# Patient Record
Sex: Male | Born: 1965
Health system: Southern US, Community
[De-identification: ages and names within clinical notes are randomized; demographics above are authoritative.]

## PROBLEM LIST (undated history)

## (undated) DIAGNOSIS — K219 Gastro-esophageal reflux disease without esophagitis: Secondary | ICD-10-CM

## (undated) DIAGNOSIS — N201 Calculus of ureter: Secondary | ICD-10-CM

## (undated) DIAGNOSIS — S060X9A Concussion with loss of consciousness of unspecified duration, initial encounter: Secondary | ICD-10-CM

## (undated) DIAGNOSIS — J4540 Moderate persistent asthma, uncomplicated: Secondary | ICD-10-CM

## (undated) DIAGNOSIS — F32A Depression, unspecified: Secondary | ICD-10-CM

## (undated) DIAGNOSIS — T7840XA Allergy, unspecified, initial encounter: Secondary | ICD-10-CM

## (undated) DIAGNOSIS — Z972 Presence of dental prosthetic device (complete) (partial): Secondary | ICD-10-CM

## (undated) DIAGNOSIS — R7303 Prediabetes: Secondary | ICD-10-CM

## (undated) DIAGNOSIS — D649 Anemia, unspecified: Secondary | ICD-10-CM

## (undated) DIAGNOSIS — M1990 Unspecified osteoarthritis, unspecified site: Secondary | ICD-10-CM

## (undated) DIAGNOSIS — J302 Other seasonal allergic rhinitis: Secondary | ICD-10-CM

## (undated) DIAGNOSIS — I10 Essential (primary) hypertension: Secondary | ICD-10-CM

## (undated) DIAGNOSIS — Z87438 Personal history of other diseases of male genital organs: Secondary | ICD-10-CM

## (undated) DIAGNOSIS — J189 Pneumonia, unspecified organism: Secondary | ICD-10-CM

## (undated) DIAGNOSIS — Z973 Presence of spectacles and contact lenses: Secondary | ICD-10-CM

## (undated) DIAGNOSIS — G4733 Obstructive sleep apnea (adult) (pediatric): Secondary | ICD-10-CM

## (undated) DIAGNOSIS — A4902 Methicillin resistant Staphylococcus aureus infection, unspecified site: Secondary | ICD-10-CM

## (undated) DIAGNOSIS — Z8719 Personal history of other diseases of the digestive system: Secondary | ICD-10-CM

## (undated) DIAGNOSIS — E739 Lactose intolerance, unspecified: Secondary | ICD-10-CM

## (undated) DIAGNOSIS — U071 COVID-19: Secondary | ICD-10-CM

## (undated) DIAGNOSIS — Z860101 Personal history of adenomatous and serrated colon polyps: Secondary | ICD-10-CM

## (undated) DIAGNOSIS — F419 Anxiety disorder, unspecified: Secondary | ICD-10-CM

## (undated) DIAGNOSIS — G4730 Sleep apnea, unspecified: Secondary | ICD-10-CM

## (undated) DIAGNOSIS — Z86010 Personal history of colonic polyps: Secondary | ICD-10-CM

## (undated) HISTORY — PX: UPPER GASTROINTESTINAL ENDOSCOPY: SHX188

## (undated) HISTORY — PX: COLONOSCOPY: SHX174

## (undated) HISTORY — DX: Allergy, unspecified, initial encounter: T78.40XA

## (undated) HISTORY — PX: OTHER SURGICAL HISTORY: SHX169

## (undated) HISTORY — DX: Depression, unspecified: F32.A

## (undated) HISTORY — DX: Anxiety disorder, unspecified: F41.9

## (undated) HISTORY — DX: Other seasonal allergic rhinitis: J30.2

## (undated) HISTORY — PX: SHOULDER ARTHROSCOPY WITH ROTATOR CUFF REPAIR AND SUBACROMIAL DECOMPRESSION: SHX5686

## (undated) HISTORY — DX: Lactose intolerance, unspecified: E73.9

## (undated) HISTORY — DX: Sleep apnea, unspecified: G47.30

---
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
## 1998-10-01 ENCOUNTER — Encounter: Payer: Self-pay | Admitting: Emergency Medicine

## 1998-10-01 ENCOUNTER — Emergency Department (HOSPITAL_COMMUNITY): Admission: EM | Admit: 1998-10-01 | Discharge: 1998-10-01 | Payer: Self-pay | Admitting: Emergency Medicine

## 1999-10-29 ENCOUNTER — Encounter: Payer: Self-pay | Admitting: Emergency Medicine

## 1999-10-29 ENCOUNTER — Emergency Department (HOSPITAL_COMMUNITY): Admission: EM | Admit: 1999-10-29 | Discharge: 1999-10-29 | Payer: Self-pay | Admitting: Emergency Medicine

## 2004-02-07 ENCOUNTER — Encounter: Admission: RE | Admit: 2004-02-07 | Discharge: 2004-02-07 | Payer: Self-pay | Admitting: Family Medicine

## 2004-02-14 ENCOUNTER — Encounter: Admission: RE | Admit: 2004-02-14 | Discharge: 2004-02-14 | Payer: Self-pay | Admitting: Family Medicine

## 2004-04-07 ENCOUNTER — Ambulatory Visit: Payer: Self-pay | Admitting: Sports Medicine

## 2005-06-09 ENCOUNTER — Ambulatory Visit: Payer: Self-pay | Admitting: Family Medicine

## 2005-06-18 ENCOUNTER — Ambulatory Visit: Payer: Self-pay | Admitting: Family Medicine

## 2005-09-15 ENCOUNTER — Ambulatory Visit: Payer: Self-pay | Admitting: Family Medicine

## 2005-09-24 ENCOUNTER — Ambulatory Visit (HOSPITAL_COMMUNITY): Admission: RE | Admit: 2005-09-24 | Discharge: 2005-09-24 | Payer: Self-pay | Admitting: *Deleted

## 2005-10-11 ENCOUNTER — Ambulatory Visit: Payer: Self-pay | Admitting: Family Medicine

## 2005-10-25 ENCOUNTER — Ambulatory Visit: Payer: Self-pay | Admitting: Sports Medicine

## 2005-10-29 ENCOUNTER — Encounter: Payer: Self-pay | Admitting: Family Medicine

## 2006-06-09 ENCOUNTER — Emergency Department (HOSPITAL_COMMUNITY): Admission: EM | Admit: 2006-06-09 | Discharge: 2006-06-09 | Payer: Self-pay | Admitting: Family Medicine

## 2006-06-14 ENCOUNTER — Ambulatory Visit: Payer: Self-pay | Admitting: Family Medicine

## 2006-09-08 DIAGNOSIS — J45909 Unspecified asthma, uncomplicated: Secondary | ICD-10-CM | POA: Insufficient documentation

## 2006-09-08 DIAGNOSIS — J4540 Moderate persistent asthma, uncomplicated: Secondary | ICD-10-CM | POA: Insufficient documentation

## 2006-12-02 ENCOUNTER — Ambulatory Visit: Payer: Self-pay | Admitting: Family Medicine

## 2006-12-02 ENCOUNTER — Telehealth: Payer: Self-pay | Admitting: *Deleted

## 2007-06-01 ENCOUNTER — Ambulatory Visit: Payer: Self-pay | Admitting: Family Medicine

## 2007-06-19 ENCOUNTER — Telehealth: Payer: Self-pay | Admitting: *Deleted

## 2007-06-30 ENCOUNTER — Encounter: Payer: Self-pay | Admitting: Family Medicine

## 2007-06-30 ENCOUNTER — Ambulatory Visit: Payer: Self-pay | Admitting: Family Medicine

## 2007-06-30 LAB — CONVERTED CEMR LAB
BUN: 11 mg/dL (ref 6–23)
CO2: 27 meq/L (ref 19–32)
Calcium: 9.6 mg/dL (ref 8.4–10.5)
Chloride: 101 meq/L (ref 96–112)
Cholesterol: 199 mg/dL (ref 0–200)
Creatinine, Ser: 1.22 mg/dL (ref 0.40–1.50)
Glucose, Bld: 89 mg/dL (ref 70–99)
HDL: 46 mg/dL (ref >39)
LDL Cholesterol: 132 mg/dL — ABNORMAL HIGH (ref 0–99)
PSA: 0.51 ng/mL (ref 0.10–4.00)
Potassium: 4.7 meq/L (ref 3.5–5.3)
Sodium: 140 meq/L (ref 135–145)
Total CHOL/HDL Ratio: 4.3
Triglycerides: 105 mg/dL (ref <150)
VLDL: 21 mg/dL (ref 0–40)

## 2007-07-02 ENCOUNTER — Encounter: Payer: Self-pay | Admitting: Family Medicine

## 2007-11-13 ENCOUNTER — Encounter (INDEPENDENT_AMBULATORY_CARE_PROVIDER_SITE_OTHER): Payer: Self-pay | Admitting: *Deleted

## 2008-05-21 ENCOUNTER — Encounter (INDEPENDENT_AMBULATORY_CARE_PROVIDER_SITE_OTHER): Payer: Self-pay | Admitting: Family Medicine

## 2008-05-21 ENCOUNTER — Ambulatory Visit: Payer: Self-pay | Admitting: Family Medicine

## 2008-05-21 LAB — CONVERTED CEMR LAB
Bilirubin Urine: NEGATIVE
Blood in Urine, dipstick: NEGATIVE
Glucose, Urine, Semiquant: NEGATIVE
Ketones, urine, test strip: NEGATIVE
Nitrite: NEGATIVE
Protein, U semiquant: NEGATIVE
Specific Gravity, Urine: 1.02
Urobilinogen, UA: 0.2
WBC Urine, dipstick: NEGATIVE
pH: 7

## 2008-06-07 ENCOUNTER — Telehealth (INDEPENDENT_AMBULATORY_CARE_PROVIDER_SITE_OTHER): Payer: Self-pay | Admitting: Family Medicine

## 2008-07-12 DIAGNOSIS — A4902 Methicillin resistant Staphylococcus aureus infection, unspecified site: Secondary | ICD-10-CM

## 2008-07-12 HISTORY — DX: Methicillin resistant Staphylococcus aureus infection, unspecified site: A49.02

## 2008-09-11 ENCOUNTER — Telehealth: Payer: Self-pay | Admitting: Family Medicine

## 2008-11-26 ENCOUNTER — Ambulatory Visit: Payer: Self-pay | Admitting: Family Medicine

## 2008-12-02 ENCOUNTER — Telehealth: Payer: Self-pay | Admitting: *Deleted

## 2009-04-02 ENCOUNTER — Ambulatory Visit: Payer: Self-pay | Admitting: Family Medicine

## 2009-04-16 ENCOUNTER — Ambulatory Visit: Payer: Self-pay | Admitting: Family Medicine

## 2009-04-16 ENCOUNTER — Telehealth: Payer: Self-pay | Admitting: Family Medicine

## 2009-05-06 ENCOUNTER — Encounter: Payer: Self-pay | Admitting: Family Medicine

## 2009-05-06 ENCOUNTER — Ambulatory Visit (HOSPITAL_COMMUNITY): Admission: RE | Admit: 2009-05-06 | Discharge: 2009-05-06 | Payer: Self-pay | Admitting: Family Medicine

## 2009-05-06 ENCOUNTER — Ambulatory Visit: Payer: Self-pay | Admitting: Family Medicine

## 2009-05-07 ENCOUNTER — Encounter: Payer: Self-pay | Admitting: Family Medicine

## 2009-05-07 LAB — CONVERTED CEMR LAB
BUN: 13 mg/dL (ref 6–23)
CO2: 27 meq/L (ref 19–32)
Calcium: 9.7 mg/dL (ref 8.4–10.5)
Chloride: 101 meq/L (ref 96–112)
Creatinine, Ser: 1.24 mg/dL (ref 0.40–1.50)
Direct LDL: 124 mg/dL — ABNORMAL HIGH
Glucose, Bld: 98 mg/dL (ref 70–99)
HCT: 48.3 % (ref 39.0–52.0)
Hemoglobin: 15.7 g/dL (ref 13.0–17.0)
MCHC: 32.5 g/dL (ref 30.0–36.0)
MCV: 89.1 fL (ref 78.0–100.0)
Platelets: 189 10*3/uL (ref 150–400)
Potassium: 4.4 meq/L (ref 3.5–5.3)
RBC: 5.42 M/uL (ref 4.22–5.81)
RDW: 13.1 % (ref 11.5–15.5)
Sodium: 140 meq/L (ref 135–145)
TSH: 2.584 microintl units/mL (ref 0.350–4.500)
WBC: 3.1 10*3/uL — ABNORMAL LOW (ref 4.0–10.5)

## 2009-05-13 ENCOUNTER — Encounter: Payer: Self-pay | Admitting: Family Medicine

## 2009-05-21 ENCOUNTER — Ambulatory Visit: Payer: Self-pay | Admitting: Sports Medicine

## 2009-05-21 ENCOUNTER — Ambulatory Visit (HOSPITAL_COMMUNITY): Admission: RE | Admit: 2009-05-21 | Discharge: 2009-05-21 | Payer: Self-pay | Admitting: Sports Medicine

## 2009-06-18 ENCOUNTER — Ambulatory Visit: Payer: Self-pay | Admitting: Family Medicine

## 2009-09-12 ENCOUNTER — Telehealth: Payer: Self-pay | Admitting: Family Medicine

## 2009-09-12 ENCOUNTER — Ambulatory Visit: Payer: Self-pay | Admitting: Family Medicine

## 2009-09-12 ENCOUNTER — Encounter: Payer: Self-pay | Admitting: Family Medicine

## 2009-09-15 ENCOUNTER — Ambulatory Visit: Payer: Self-pay | Admitting: Family Medicine

## 2009-09-15 ENCOUNTER — Telehealth: Payer: Self-pay | Admitting: Family Medicine

## 2009-09-15 DIAGNOSIS — J301 Allergic rhinitis due to pollen: Secondary | ICD-10-CM | POA: Insufficient documentation

## 2009-09-17 ENCOUNTER — Telehealth: Payer: Self-pay | Admitting: Family Medicine

## 2009-09-24 ENCOUNTER — Telehealth: Payer: Self-pay | Admitting: Family Medicine

## 2009-10-07 ENCOUNTER — Ambulatory Visit: Payer: Self-pay | Admitting: Family Medicine

## 2009-10-07 ENCOUNTER — Telehealth: Payer: Self-pay | Admitting: Family Medicine

## 2009-10-08 ENCOUNTER — Encounter: Payer: Self-pay | Admitting: Family Medicine

## 2010-01-22 ENCOUNTER — Telehealth: Payer: Self-pay | Admitting: Family Medicine

## 2010-01-23 ENCOUNTER — Ambulatory Visit: Payer: Self-pay | Admitting: Family Medicine

## 2010-04-22 ENCOUNTER — Encounter: Payer: Self-pay | Admitting: Family Medicine

## 2010-07-02 ENCOUNTER — Encounter: Payer: Self-pay | Admitting: Family Medicine

## 2010-07-12 HISTORY — PX: SHOULDER ARTHROSCOPY WITH ROTATOR CUFF REPAIR AND SUBACROMIAL DECOMPRESSION: SHX5686

## 2010-08-11 ENCOUNTER — Ambulatory Visit
Admission: RE | Admit: 2010-08-11 | Discharge: 2010-08-11 | Payer: Self-pay | Source: Home / Self Care | Attending: Orthopedic Surgery | Admitting: Orthopedic Surgery

## 2010-08-11 LAB — POCT HEMOGLOBIN-HEMACUE: Hemoglobin: 16.3 g/dL (ref 13.0–17.0)

## 2010-08-11 NOTE — Progress Notes (Signed)
Summary: triage   Phone Note Call from Patient Call back at Work Phone [phone]   Caller: Patient Summary of Call: none of the allergy meds are working and he is having chest congestion/tightness again and thinks he needs prednisone which is what they have done for him in the past.   Initial call taken by: De Nurse,  October 07, 2009 8:59 AM  Follow-up for Phone Call        wanted the prednisone called in. told him he will need to be seen first. will be here at 1:30 to be seen by a preceptor who is taking work ins. Follow-up by: [name],  October 07, 2009 9:02 AM  Additional Follow-up for Phone Call Additional follow up Details #1::        thank you. Additional Follow-up by: [name]  MD,  October 07, 2009 12:34 PM

## 2010-08-11 NOTE — Progress Notes (Signed)
Summary: Triage  Medications Added ZITHROMAX Z-PAK 250 MG TABS (AZITHROMYCIN) use as directed (2 on first day, 1 on days 2-5.)       Phone Note Call from Patient Call back at Work Phone [phone]   Reason for Call: Talk to Nurse Summary of Call: pt still having problems from his last visit, thinks it is not viral Initial call taken by: Knox Royalty,  September 24, 2009 2:39 PM  Follow-up for Phone Call        took a wkk off & thought he was getting better. now it is "coming back"has been taking guifenisen. productive cough & tightness in chest. using flovent & albuterol often. does not want to come back in for same thing. asked that [facility] be called to  [facility] told him pcp may want to see him Follow-up by: [name],  September 24, 2009 2:44 PM  Additional Follow-up for Phone Call Additional follow up Details #1::        I didn't see him initially for this.  if worsening, he needs to be seen to eval.   Additional Follow-up by: [name]  MD,  September 24, 2009 3:03 PM    Additional Follow-up for Phone Call Additional follow up Details #2::    states he is not paying another $25 co-pay. says he just does not have it. back to pcp Follow-up by: [name],  September 24, 2009 3:50 PM  Additional Follow-up for Phone Call Additional follow up Details #3:: Details for Additional Follow-up Action Taken: will send to other doctors who have seen him.  i will not prescribe abx without evaluating him but they may be comfortable doing so. Additional Follow-up by: [name]  MD,  September 24, 2009 4:08 PM  New/Updated Medications: ZITHROMAX Z-PAK 250 MG TABS (AZITHROMYCIN) use as directed (2 on first day, 1 on days 2-5.) I agree with Dr. Sharen Hones.  I truly believe this was a viral illness and allergies contributing to his symptoms.  I don't feel comfortable giving him an antibiotic without further evaluation........Marland KitchenMarisue Ivan, MD  Prescriptions: Christena Deem  Z-PAK 250 MG TABS (AZITHROMYCIN) use as directed (2 on first day, 1 on days 2-5.)  #1 x 0   Entered and Authorized by:   [name]  MD   Signed by:   [name] on 09/25/2009   Method used:   Electronically to        [facility] [address] [phone]* (retail)       [address]       [place], [place]  [address]       Ph: [phone] or [phone]       Fax: [phone]   RxID:   [id number]   Will prescribe Zpack to cover sinusitis as has been >2wks since beginning of illness, pt states worsening, and pt cannot come in again 2/2 copay of $25.  please call in to wherever he wants.  If not improving, will need to come in.   [name]  MD  September 25, 2009 9:41 AM   I called pt & notified him. called it to above pharmacy.[name]  September 25, 2009 1:43 PM

## 2010-08-11 NOTE — Progress Notes (Signed)
Summary: triage   Phone Note Call from Patient Call back at Work Phone [phone]   Caller: Patient Summary of Call: pt has been taking all his meds and he is now experiencing runny nose and congetion.  would like to know if there is something that can be called in for him w/o coming in for OV [facility] Initial call taken by: De Nurse,  January 22, 2010 2:30 PM  Follow-up for Phone Call        states he is taking al of his meds but still has symptoms. he is adamant that md call something in. told him it has been 3+ months since last visit & we do not do this. he wants me to ask md . told him I will & call him back. he also wants referral to allergist. he does not know who will take his insurance. told him to call the number on the back of his card of call an allergist & ask if they take it.  Follow-up by: [name],  January 22, 2010 3:00 PM  Additional Follow-up for Phone Call Additional follow up Details #1::        Agree.  I send him  a letter 3 mo ago.  He will need to be seen before we can do anything Additional Follow-up by: [name],  January 22, 2010 3:14 PM     Appended Document: triage he will be here at 8:30 tomorrow to see work in md

## 2010-08-11 NOTE — Assessment & Plan Note (Signed)
Summary: Allergies and viral URI w/ laryngitis   Vital Signs:  Patient profile:   45 year old male Weight:      243.2 pounds BMI:     32.20 Temp:     98.2 degrees F oral Pulse rate:   82 / minute Pulse rhythm:   regular BP sitting:   170 / 95  (right arm)  Vitals Entered By: Loralee Pacas CMA (September 15, 2009 9:56 AM) CC: Allergies and hoarseness Comments hx of asthma, productive cough w/green phlem x2 days, tightness in chest from coughing using flowvent more than usual wheezing, sore throat, bi lateral ear ringing.   Primary Care Provider:  [name]  MD  CC:  Allergies and hoarseness.  History of Present Illness: 45 yo M c/o no improvement of prior symptoms  Allergies: Hx of seasonal allergies on Flonase and Flovent.  Currently taking Zyrtec.  Still c/o itchy, watery eyes with sneezing.  Viral illness:  x 7days  w/  cough and congestion and now with hoarseness.  Ear symptoms resolved. C/O primarily cough and rhinorrhea. Pt does report subjective fever and chills. Pt denies any nausea, vomiting, diarrhea ,constipation, or SOB.   Current Medications (verified): 1)  Flonase 50 Mcg/act  Susp (Fluticasone Propionate) .... Take 2 Per Nostril Daily 2)  Albuterol 90 Mcg/act  Aers (Albuterol) .... Take 2 Puffs Q4-6 Hours As Needed For Wheezing/sob 3)  Flovent Hfa 44 Mcg/act  Aero (Fluticasone Propionate  Hfa) .Marland Kitchen.. 1 Inhalation Two Times A Day 4)  Omeprazole 40 Mg Cpdr (Omeprazole) .... One Tablet By Mouth Daily For Acid Reflux 5)  Naprosyn 500 Mg Tabs (Naproxen) .... Take One By Mouth Two Times A Day X 10 Days Then As Needed Shoulder Pain  Allergies (verified): No Known Drug Allergies  Review of Systems       Pt does report subjective fever and chills. Pt denies any nausea, vomiting, diarrhea ,constipation, or SOB.  Physical Exam  General:  VS Reviewed. Well appearing, NAD. Obvious hoarseness  Head:  no sinus ttp Eyes:  mildly injected conjuntiva b/l no  tearing Ears:  External ear exam shows no significant lesions or deformities.  Otoscopic examination reveals mild hardened cerumen in Left canal but not blocking the TM, tympanic membranes are intact bilaterally without bulging, retraction, inflammation or discharge. Hearing is grossly normal bilaterally. Mouth:  poor dentition moist mucus membranes no erythema, edema, or exudative tonsils Neck:  supple, full ROM, no goiter or mass  Lungs:  Normal respiratory effort, chest expands symmetrically. Lungs are clear to auscultation, no crackles or wheezes. Heart:  Normal rate and regular rhythm. S1 and S2 normal without gallop, murmur, click, rub or other extra sounds. Skin:  nl color and turgor Cervical Nodes:  no LAD   Impression & Recommendations:  Problem # 1:  LARYNGITIS, ACUTE (ICD-464.00) Assessment New  Secondary to underlying URI.  Will treat with supportive care and reassurance.  Orders: [facility]- Est Level  3 ([address])  Problem # 2:  UPPER RESPIRATORY INFECTION, VIRAL (ICD-465.9) Assessment: Unchanged  Supportive care and reassurance at this time that sometimes this can take up to 14 days to resolve on average.  His updated medication list for this problem includes:    Naprosyn 500 Mg Tabs (Naproxen) .Marland Kitchen... Take one by mouth two times a day x 10 days then as needed shoulder pain  Orders: [facility]- Est Level  3 ([address])  Problem # 3:  ALLERGIC RHINITIS, SEASONAL (ICD-477.0) Assessment: Deteriorated  Acute symptoms with worsening  course. Advised to switch to allegra since zyrtec is not working. Will f/u if symptoms don't improve with the change.  Orders: [facility]- Est Level  3 ([address])  Complete Medication List: 1)  Flonase 50 Mcg/act Susp (Fluticasone propionate) .... Take 2 per nostril daily 2)  Albuterol 90 Mcg/act Aers (Albuterol) .... Take 2 puffs q4-6 hours as needed for wheezing/sob 3)  Flovent Hfa 44 Mcg/act Aero (Fluticasone propionate  hfa) .Marland Kitchen.. 1 inhalation two times a  day 4)  Omeprazole 40 Mg Cpdr (Omeprazole) .... One tablet by mouth daily for acid reflux 5)  Naprosyn 500 Mg Tabs (Naproxen) .... Take one by mouth two times a day x 10 days then as needed shoulder pain  Patient Instructions: 1)  You have both allergies and viral infection causing the laryngitis.   2)  Recommend trying allegra for the allergies since the zyrtec is not working. 3)  It will take more time and rest for the viral illness to resolve.  Expect 14 day course on average.

## 2010-08-11 NOTE — Progress Notes (Signed)
Summary: triage   Phone Note Call from Patient Call back at Work Phone [phone]   Caller: Patient Summary of Call: still having a lot congestion in chest and nothing OTC is helping - pls advise Initial call taken by: De Nurse,  September 15, 2009 9:32 AM  Follow-up for Phone Call        states he still is quite sick. was not given any meds at last appt. told him that is the md's call. if suspected viral, he/she does not give antibiotics. will come in now to be seen Follow-up by: [name],  September 15, 2009 9:37 AM  Additional Follow-up for Phone Call Additional follow up Details #1::        thanks. Additional Follow-up by: [name]  MD,  September 15, 2009 1:46 PM

## 2010-08-11 NOTE — Progress Notes (Signed)
Summary: refill   Phone Note Refill Request Call back at Work Phone [phone] Message from:  Patient  Refills Requested: Medication #1:  NAPROSYN 500 MG TABS take one by mouth two times a day x 10 days then as needed shoulder pain.  Medication #2:  FLONASE 50 MCG/ACT  SUSP take 2 per nostril daily Initial call taken by: De Nurse,  September 12, 2009 10:31 AM  Follow-up for Phone Call        [facility] Follow-up by: [name]  MD,  September 12, 2009 10:32 AM    Prescriptions: NAPROSYN 500 MG TABS (NAPROXEN) take one by mouth two times a day x 10 days then as needed shoulder pain  #30 x 1   Entered and Authorized by:   [name]  MD   Signed by:   [name]  MD on 09/12/2009   Method used:   Electronically to        [facility] [address] [phone]* (retail)       [address]       [place], [place]  [address]       Ph: [phone] or [phone]       Fax: [phone]   RxID:   [id number] FLONASE 50 MCG/ACT  SUSP (FLUTICASONE PROPIONATE) take 2 per nostril daily  #1 x 3   Entered and Authorized by:   [name]  MD   Signed by:   [name]  MD on 09/12/2009   Method used:   Electronically to        [facility] [address] [phone]* (retail)       [address]       [place], [place]  [address]       Ph: [phone] or [phone]       Fax: [phone]   RxID:   [id number]

## 2010-08-11 NOTE — Progress Notes (Signed)
Summary: triage  Medications Added HYDROMET 5-1.5 MG/5ML SYRP (HYDROCODONE-HOMATROPINE) 5cc q 4-6 hours as needed cough, care with sedation.       Phone Note Call from Patient Call back at Work Phone [phone]   Caller: Patient Summary of Call: pt is coughing so bad that he can't sleep and throat feels raw  Initial call taken by: De Nurse,  September 17, 2009 9:13 AM  Follow-up for Phone Call        started since wed of last week. has been in here twice.  he is worse. coughs so much he cannot sleep. relates hx of asthma. his voice sounds "tight" . c/o sore throat from coughing. offered appt. he does not want to come back in. states he has paid 2 co-pays in the past week or so. asked that something be called in. told him I will ask md who saw him & pcp. I will call him back  [facility] [place].  Follow-up by: [name],  September 17, 2009 9:15 AM  Additional Follow-up for Phone Call Additional follow up Details #1::        can try cough syrup.  i've put it in chart. Additional Follow-up by: [name]  MD,  September 17, 2009 9:28 AM    New/Updated Medications: HYDROMET 5-1.5 MG/5ML SYRP (HYDROCODONE-HOMATROPINE) 5cc q 4-6 hours as needed cough, care with sedation. Prescriptions: HYDROMET 5-1.5 MG/5ML SYRP (HYDROCODONE-HOMATROPINE) 5cc q 4-6 hours as needed cough, care with sedation.  #75cc x 0   Entered and Authorized by:   [name]  MD   Signed by:   [name]  MD on 09/17/2009   Method used:   Printed then faxed to ...       [facility] [address] [phone]* (retail)       [address]       [place], [place]  [address]       Ph: [phone] or [phone]       Fax: [phone]   RxID:   [id number]  called it in to another pharmacy per pt request as he is not near the one he usually uses. did not fax to the one above.he is closest to [facility] in [place] [phone].[name]  September 17, 2009 10:02 AM

## 2010-08-11 NOTE — Assessment & Plan Note (Signed)
Summary: sinus inf,df   Vital Signs:  Patient profile:   45 year old male Weight:      245.7 pounds Temp:     98.5 degrees F oral Pulse rate:   71 / minute BP sitting:   122 / 85  (right arm)  Vitals Entered By: Arlyss Repress CMA, (September 12, 2009 1:32 PM) CC: congestion and cough x 3 days. Is Patient Diabetic? No Pain Assessment Patient in pain? no        Primary Care Provider:  [name]  MD  CC:  congestion and cough x 3 days.Marland Kitchen  History of Present Illness: 57 YOM w/ PMHx/o seasonal allergies and asthma here w/ 3-4 day hx/o cough and congestion. Pt states syptoms began 3 days ago w/ primarily pharyngitis and rhinorrhea. Symptoms transitioned to primarily cough and rhinorrhea after 1st day of illness. Pt does report subjective fever and chills. Pt denies any nausea, vomiting, diarrhea ,constipation. Pt also states that he usually gets sinus and throat irritation this time of year secondary to allergies.   Habits & Providers  Alcohol-Tobacco-Diet     Tobacco Status: quit > 6 months  Allergies: No Known Drug Allergies  Social History: Smoking Status:  quit > 6 months  Physical Exam  General:  alert and well-developed.   Head:  normocephalic and atraumatic.  Ears:  R TM inflammed, L TM clear + ? cerumen impaction on lateral L canal  Nose:  + nasal erythema bilaterally Mouth:  + post oropharyngeal erythema, no tonsilar exudates visualized.  Neck:  supple and full ROM, no cervical LADsupple and full ROM.   Lungs:  normal respiratory effort.  normal respiratory effort.     Impression & Recommendations:  Problem # 1:  UPPER RESPIRATORY INFECTION, VIRAL (ICD-465.9) Pt presenting w/ likely viral URI in setting of likely allergic disease and pollen counts are borderline high. Pt advised to continue supportive care. As well as continue zyrted use for allergic management. Pt also w/ R TM erythema w/ minimal bulging. Pt does report some intermittent tugging of the ear.  Ear exam may be secondary to current viral URI +/- Allergic disease. Will reserve treatment of ear. Pt advised to call [facility] n[facility]  7 days if ear irrittation does not subside as pt may need treatment for possible OM.  His updated medication list for this problem includes:    Naprosyn 500 Mg Tabs (Naproxen) .Marland Kitchen... Take one by mouth two times a day x 10 days then as needed shoulder pain  Orders: [facility]- Est Level  3 ([address])  Complete Medication List: 1)  Flonase 50 Mcg/act Susp (Fluticasone propionate) .... Take 2 per nostril daily 2)  Albuterol 90 Mcg/act Aers (Albuterol) .... Take 2 puffs q4-6 hours as needed for wheezing/sob 3)  Flovent Hfa 44 Mcg/act Aero (Fluticasone propionate  hfa) .Marland Kitchen.. 1 inhalation two times a day 4)  Omeprazole 40 Mg Cpdr (Omeprazole) .... One tablet by mouth daily for acid reflux 5)  Naprosyn 500 Mg Tabs (Naproxen) .... Take one by mouth two times a day x 10 days then as needed shoulder pain   Prevention & Chronic Care Immunizations   Influenza vaccine: Not documented    Tetanus booster: 05/12/2004: Done.   Tetanus booster due: 05/12/2014    Pneumococcal vaccine: Not documented  Other Screening   PSA: 0.51  (06/30/2007)   PSA due due: 06/29/2008   Smoking status: quit > 6 months  (09/12/2009)  Lipids   Total Cholesterol: 199  (06/30/2007)  LDL: 132  (06/30/2007)   LDL Direct: 124  (05/06/2009)   HDL: 46  (06/30/2007)   Triglycerides: 105  (06/30/2007)

## 2010-08-11 NOTE — Letter (Signed)
Summary: Generic Letter  [facility]  [address]   [place], [place] [address]   Phone: [phone]  Fax: [phone]    09/12/2009  [name] [address] [place], [place]  [address]  To whom it may concern,  Please excuse Clarence Alvarez form work 09/12/09-09/15/09 due to viral illness. If any further information is needed please feel free to contact our office.            Sincerely,   [name] MD

## 2010-08-11 NOTE — Miscellaneous (Signed)
  Clinical Lists Changes  Problems: Changed problem from ASTHMA, UNSPECIFIED (ICD-493.90) to ASTHMA, PERSISTENT (ICD-493.90) 

## 2010-08-11 NOTE — Assessment & Plan Note (Signed)
Summary: runny nose & congestion/[place]/red team   Vital Signs:  Patient profile:   45 year old male Height:      73 inches Weight:      251 pounds BMI:     33.24 Temp:     97.8 degrees F oral Pulse rate:   82 / minute BP sitting:   118 / 83  (right arm) Cuff size:   large  Vitals Entered By: Tessie Fass CMA (January 23, 2010 8:47 AM) CC: sinus infection x 1 week   Primary Care Provider:  . [facility]  CC:  sinus infection x 1 week.  History of Present Illness: Pt statest that he has recurrent sinus infections. The last time he had one he was placed on 2 different antiobiotics and they did not work. When he was placed on Prednisone he started to have relief in teh frist 5 days. He would like a referral to an allergist as he is on many allergy meds and does not have adequate relief. He is taking Flonase, Zyrtec, and some kind of other spray. He says his throat has been scrathy and he has drainage down his thoat. His symptoms have been present for the last 1 week. He feels pressure in his maxillary sinuses. Frontal sinuses feel fine. Afebrile. He has seen some tonsilliths in the back of his throat and he mentions that this usually means he is getting an infection.   Current Medications (verified): 1)  Flonase 50 Mcg/act  Susp (Fluticasone Propionate) .... Take 2 Per Nostril Daily 2)  Albuterol 90 Mcg/act  Aers (Albuterol) .... Take 2 Puffs Q4-6 Hours As Needed For Wheezing/sob 3)  Flovent Hfa 44 Mcg/act  Aero (Fluticasone Propionate  Hfa) .Marland Kitchen.. 1 Inhalation Two Times A Day 4)  Omeprazole 40 Mg Cpdr (Omeprazole) .... One Tablet By Mouth Daily For Acid Reflux 5)  Naprosyn 500 Mg Tabs (Naproxen) .... Take One By Mouth Two Times A Day X 10 Days Then As Needed Shoulder Pain 6)  Hydromet 5-1.5 Mg/32ml Syrp (Hydrocodone-Homatropine) .... 5cc Q 4-6 Hours As Needed Cough, Care With Sedation. 7)  Prednisone 20 Mg Tabs (Prednisone) .... 2 Daily For The Next 5 Days  Allergies (verified): No Known  Drug Allergies  Review of Systems        vitals reviewed and pertinent negatives and positives seen in HPI   Physical Exam  General:  Well-developed,well-nourished,in no acute distress; alert,appropriate and cooperative throughout examination Mouth:  some tonsilliths in the crypts of his tonsils. minor erythema, moist mucus membranes.  Neck:  No deformities, masses, or tenderness noted. Lungs:  Normal respiratory effort, chest expands symmetrically. Lungs are clear to auscultation, no crackles or wheezes.   Impression & Recommendations:  Problem # 1:  SINUS PAIN (ICD-478.19) Assessment Deteriorated Pt has recurrent sinus pain with possible infection. He has allergies so it is hard to tell what is the ultimate cause of his sinus pressure and drainage. he does feel like his throat is sore and has a little cough from the drainage but has been otherwise fine. Pt wants referral to allergist to see if that component can be worked on. Also, he was put on 2 Abx last time and the only thing that finally helped his sinuses feel better was prednisone. Plan to do a 5 day burst of prednisone as we work on an allergist referral   Orders: [facility]- Est Level  3 ([address])  Complete Medication List: 1)  Flonase 50 Mcg/act Susp (Fluticasone propionate) .... Take  2 per nostril daily 2)  Albuterol 90 Mcg/act Aers (Albuterol) .... Take 2 puffs q4-6 hours as needed for wheezing/sob 3)  Flovent Hfa 44 Mcg/act Aero (Fluticasone propionate  hfa) .Marland Kitchen.. 1 inhalation two times a day 4)  Omeprazole 40 Mg Cpdr (Omeprazole) .... One tablet by mouth daily for acid reflux 5)  Naprosyn 500 Mg Tabs (Naproxen) .... Take one by mouth two times a day x 10 days then as needed shoulder pain 6)  Hydromet 5-1.5 Mg/57ml Syrp (Hydrocodone-homatropine) .... 5cc q 4-6 hours as needed cough, care with sedation. 7)  Prednisone 20 Mg Tabs (Prednisone) .... 2 daily for the next 5 days  Other Orders: Allergy Referral   (Allergy) Prescriptions: PREDNISONE 20 MG TABS (PREDNISONE) 2 daily for the next 5 days  #10 x 0   Entered and Authorized by:   Jamie Brookes MD   Signed by:   Jamie Brookes MD on 01/23/2010   Method used:   Electronically to        [facility] [address] [phone]* (retail)       [address]       [place], [place]  [address]       Ph: [phone] or [phone]       Fax: [phone]   RxID:   [id number]

## 2010-08-11 NOTE — Letter (Signed)
Summary: Generic Letter  [facility]  [address]   [place], [place] [address]   Phone: [phone]  Fax: [phone]    10/08/2009  Clarence Alvarez [address] [place], [place]  [address]  Dear Mr. Weingartner,  It was nice to see you the other day, again I'm sorry for the fire alarm.  I reviewed your records and we do not have any record of pulmonary function tests.   In order to know best how to treat you and where to refer you we should obtain pulmonary tests.  If they are normal you should more likely see an allergist if they are abnormal perhaps a pulmonologist.    I would recommend you wait a few weeks until your post bronchitis cough has subsided and the prednisone is out of your system then call and make an appointment for Pulmonary Function Tests here at our office.  Continue to use all your regular medicine but do NOT use albuterol 3 days before the test.  Call if you have questions.  Sincerely,   [name]   Appended Document: Generic Letter mailed.

## 2010-08-11 NOTE — Progress Notes (Signed)
Summary: persistent cough, cold symptoms   Phone Note Call from Patient   Caller: Patient Summary of Call: Pt calling to report persistent cough, runny nose, cold symptoms and inability to sleep. Wants something called in that will actually work for his cough and help him rest. I advised that, unfortunately, there was nothing that I could prescribe over the phone after-hours. What's more I advised that there is no cough medicine that works well. Suggested coming back to the [facility] tomorrow am. Pt responded that was "out of the question." I responded that was up to him. Also advised that he could come to the ER if he didn't feel that I could handle his symptoms but that I didn't feel that would be the best thing to do right now as he should get better with more time. Pt agreeable.  Initial call taken by: Myrtie Soman  MD,  September 17, 2009 4:46 AM

## 2010-08-11 NOTE — Assessment & Plan Note (Signed)
Summary: asthma-see Clarence Alvarez 1st/Clarence Alvarez/Clarence Alvarez   Vital Signs:  Patient profile:   45 year old male Height:      73 inches Weight:      245 pounds BMI:     32.44 Temp:     97.9 degrees F Pulse rate:   69 / minute BP sitting:   136 / 87  Vitals Entered By: [name] (October 07, 2009 1:37 PM) CC: congestion x 1 month Is Patient Diabetic? No Pain Assessment Patient in pain? no        Primary Care Provider:  [name]  MD  CC:  congestion x 1 month.  History of Present Illness: Cough Continues to have cough with scant yellow sputum tight feeling in chest, post nasal drip despite all his inhalers and antibiotics. Using over the counter decongestant and allegra.  No fever or severe shortness of breath or edema.  Has had asthma and allergy symptoms for years  Reviewed his paper chart - no record of PFTS  ROS - as above PMH - Medications reviewed and updated in medication list.  Smoking Status noted in VS form    Habits & Providers  Alcohol-Tobacco-Diet     Alcohol drinks/day: 0     Tobacco Status: quit > 6 months  Exercise-Depression-Behavior     Drug Use: never     Seat Belt Use: always     Sun Exposure: frequent  Current Medications (verified): 1)  Flonase 50 Mcg/act  Susp (Fluticasone Propionate) .... Take 2 Per Nostril Daily 2)  Albuterol 90 Mcg/act  Aers (Albuterol) .... Take 2 Puffs Q4-6 Hours As Needed For Wheezing/sob 3)  Flovent Hfa 44 Mcg/act  Aero (Fluticasone Propionate  Hfa) .Marland Kitchen.. 1 Inhalation Two Times A Day 4)  Omeprazole 40 Mg Cpdr (Omeprazole) .... One Tablet By Mouth Daily For Acid Reflux 5)  Naprosyn 500 Mg Tabs (Naproxen) .... Take One By Mouth Two Times A Day X 10 Days Then As Needed Shoulder Pain 6)  Hydromet 5-1.5 Mg/90ml Syrp (Hydrocodone-Homatropine) .... 5cc Q 4-6 Hours As Needed Cough, Care With Sedation. 7)  Prednisone 20 Mg Tabs (Prednisone) .... 2 Daily For The Next 5 Days Then 1 Daily For 3 Days Then 1/2 Daily For 3  Days  Allergies: No Known Drug Allergies  Social History: Risk analyst Use:  always Sun Exposure-Excessive:  frequent Drug Use:  never  Physical Exam  General:  awake alert no acute distress  Nose:  congested  Mouth:  Oral mucosa and oropharynx without lesions or exudates.  Teeth in good repair.  post nasal drip visible Lungs:  No wheeze mild increase in expiratory phase.  No dullness or rales  Heart:  Normal rate and regular rhythm. S1 and S2 normal without gallop, murmur, click, rub or other extra sounds. Extremities:  no edema   Impression & Recommendations:  Problem # 1:  ASTHMA, UNSPECIFIED (ICD-493.90)  Mild exacerbation vs post bronchits cough.  Pt wishes prednisione which helped before.   Wife wishes referral to asthma/allergist.   No signs of pneumonia or chf or coronary artery disease.  No record he has had PFTS.  I believe best course is to obtain PFTs aftter this exacerbation if abnormal then could refer to pulmonologist if normal may benefit from allergist referral.  will send letter to that effect   His updated medication list for this problem includes:    Albuterol 90 Mcg/act Aers (Albuterol) .Marland Kitchen... Take 2 puffs q4-6 hours as needed for wheezing/sob    Flovent  Hfa 44 Mcg/act Aero (Fluticasone propionate  hfa) .Marland Kitchen... 1 inhalation two times a day    Prednisone 20 Mg Tabs (Prednisone) .Marland Kitchen... 2 daily for the next 5 days then 1 daily for 3 days then 1/2 daily for 3 days  Orders: [facility]- Est  Level 4 (99214)  Complete Medication List: 1)  Flonase 50 Mcg/act Susp (Fluticasone propionate) .... Take 2 per nostril daily 2)  Albuterol 90 Mcg/act Aers (Albuterol) .... Take 2 puffs q4-6 hours as needed for wheezing/sob 3)  Flovent Hfa 44 Mcg/act Aero (Fluticasone propionate  hfa) .Marland Kitchen.. 1 inhalation two times a day 4)  Omeprazole 40 Mg Cpdr (Omeprazole) .... One tablet by mouth daily for acid reflux 5)  Naprosyn 500 Mg Tabs (Naproxen) .... Take one by mouth two times a day x 10 days  then as needed shoulder pain 6)  Hydromet 5-1.5 Mg/39ml Syrp (Hydrocodone-homatropine) .... 5cc q 4-6 hours as needed cough, care with sedation. 7)  Prednisone 20 Mg Tabs (Prednisone) .... 2 daily for the next 5 days then 1 daily for 3 days then 1/2 daily for 3 days  Patient Instructions: 1)  Followup with Dr Buena Irish 2)  Use prednisone as directed 3)  If you develop chest pain or shortness of breath or fever call us 4)  Your cough will likely last for another 4-6 weeks 5)  Continue all your regular medicine 6)  I will review your chart and suggest a referral We will call you some time this week Prescriptions: PREDNISONE 20 MG TABS (PREDNISONE) 2 daily for the next 5 days then 1 daily for 3 days then 1/2 daily for 3 days  #16 x 0   Entered and Authorized by:   [name]   Signed by:   [name] on 10/07/2009   Method used:   Electronically to        [facility] [address] [phone]* (retail)       [id number]       [place], [place]  [address]       Ph: [phone] or [phone]       Fax: [phone]   RxID:   [id number]

## 2010-08-13 NOTE — Miscellaneous (Signed)
   Clinical Lists Changes  Problems: Removed problem of SINUS PAIN (ICD-478.19) Removed problem of LARYNGITIS, ACUTE (ICD-464.00) Removed problem of UPPER RESPIRATORY INFECTION, VIRAL (ICD-465.9) Removed problem of TINNITUS (ICD-388.30) Removed problem of SHOULDER PAIN, LEFT (ICD-719.41) Removed problem of CHEST PAIN (ICD-786.50) Removed problem of LUMBAGO (ICD-724.2) Removed problem of History of  PROSTATITIS, ACUTE (ICD-601.0) Removed problem of HEALTH MAINTENANCE EXAM (ICD-V70.0) Removed problem of NECK PAIN, LEFT (ICD-723.1) Removed problem of SPECIAL SCREENING MALIGNANT NEOPLASM OF PROSTATE (ICD-V76.44) Removed problem of SCREENING FOR HYPERTENSION (ICD-V81.1) Removed problem of SCREENING FOR LIPOID DISORDERS (ICD-V77.91)

## 2010-09-01 NOTE — Op Note (Signed)
NAME:  Clarence, Alvarez               ACCOUNT NO.:  [id number]  MEDICAL RECORD NO.:  [id number]          PATIENT TYPE:  AMB  LOCATION:  [facility]                          FACILITY:  [facility]  PHYSICIAN:  Jerusalem Wert A. Thurston Hole, M.D. DATE OF BIRTH:  08-10-1965  DATE OF PROCEDURE:  08/11/2010 DATE OF DISCHARGE:                              OPERATIVE REPORT   PREOPERATIVE DIAGNOSES: 1. Left shoulder rotator cuff tear. 2. Left shoulder partial labrum tear. 3. Left shoulder impingement.  POSTOPERATIVE DIAGNOSES: 1. Left shoulder rotator cuff tear. 2. Left shoulder partial labrum tear. 3. Left shoulder impingement.  PROCEDURE: 1. Left shoulder examination under anesthesia followed by     arthroscopically assisted rotator cuff repair using Arthrex suture     anchor x1 plus PushLock anchor x1. 2. Left shoulder debridement of partial labrum tear. 3. Left shoulder subacromial decompression.  SURGEON:  Elana Alm. Thurston Hole, MD  ASSISTANT:  Julien Girt, PA  ANESTHESIA:  General.  OPERATIVE TIME:  1 hour.  COMPLICATIONS:  None.  INDICATIONS FOR PROCEDURE:  [name] is a 46 year old gentleman who has had 6 months of increasing left shoulder pain with exam and MRI documenting rotator cuff tear, partial labrum tear with impingement.  He has failed conservative care and is now to undergo arthroscopy.  DESCRIPTION:  [name] was brought to the operating room on August 11, 2010, after an interscalene block was placed in holding area by anesthesia. He was placed on operating table in supine position.  After being placed under general anesthesia, his left shoulder was examined. He had full range of motion, and his shoulder with stable ligamentous exam.  He was then placed in a beach-chair position, and his shoulder and arm were prepped using sterile DuraPrep and draped using sterile technique.  He received Ancef 2 g IV preoperatively for prophylaxis. Time-out procedure was called and the correct  left shoulder identified. Initially, the arthroscopy was performed through a posterior arthroscopic portal.  The arthroscope with a pump attached was placed into an anterior portal and arthroscopic probe was placed.  On initial inspection, the articular cartilage in the glenohumeral joint was intact.  He had partial tearing of the anterior, superior, and posterior labrum 25% which was debrided.  The anterior-inferior labrum and anterior-inferior glenohumeral ligament complex was intact.  Biceps tendon anchor and biceps tendon was intact.  Rotator cuff showed a complete tear of the supraspinatus.  The rest of the rotator cuff was intact.  Inferior capsular recess free of pathology.  Subacromial space was entered and a lateral arthroscopic portal was made.  Moderately thickened bursitis was resected.  Impingement was noted, and a subacromial decompression was carried out as well as CA ligament release.  The [facility] joint was not pathologic, and thus was not resected. The rotator cuff tear was well visualized and was further partially debrided and then was repaired primarily with two accessory lateral portals with one Arthrex 5.5-mm suture anchor in the greater tuberosity. Each of the sutures in this anchor were passed arthroscopically in a mattress suture technique and then tied down with firm and tight fixation and then the repair was further enhanced  with one push lock anchor in the lateral aspect of the greater tuberosity.  After this was done, shoulder could be brought through full range of motion with no impingement on the repair.  At this point, it was felt that all pathology had been satisfactorily addressed.  The instruments were removed.  Portals were closed with 3-0 nylon suture.  Sterile dressings and a sling applied.  The patient awakened, taken to recovery room in stable condition.  FOLLOWUP CARE:  [name] will be followed as an outpatient on Percocet and Robaxin with early  physical therapy.  See me back in the office in a week for sutures out and followup.     Clarence Alvarez A. Thurston Hole, M.D.     RAW/MEDQ  D:  08/12/2010  T:  08/13/2010  Job:  [id number]  Electronically Signed by Salvatore Marvel M.D. on 09/01/2010 08:36:40 AM

## 2011-02-15 ENCOUNTER — Ambulatory Visit (INDEPENDENT_AMBULATORY_CARE_PROVIDER_SITE_OTHER): Payer: 59 | Admitting: Family Medicine

## 2011-02-15 VITALS — BP 125/82 | HR 76 | Temp 98.2°F | Ht 73.0 in | Wt 246.0 lb

## 2011-02-15 DIAGNOSIS — H6690 Otitis media, unspecified, unspecified ear: Secondary | ICD-10-CM | POA: Insufficient documentation

## 2011-02-15 DIAGNOSIS — J45909 Unspecified asthma, uncomplicated: Secondary | ICD-10-CM

## 2011-02-15 MED ORDER — FLUTICASONE PROPIONATE 50 MCG/ACT NA SUSP: 2.0000 | Freq: Every day | NASAL | Status: DC

## 2011-02-15 MED ORDER — ALBUTEROL 90 MCG/ACT IN AERS: 2.0000 | INHALATION_SPRAY | Freq: Four times a day (QID) | RESPIRATORY_TRACT | Status: DC | PRN

## 2011-02-15 MED ORDER — FLUTICASONE PROPIONATE HFA 44 MCG/ACT IN AERO: 2.0000 | INHALATION_SPRAY | Freq: Two times a day (BID) | RESPIRATORY_TRACT | Status: DC
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
## 2011-02-15 NOTE — Assessment & Plan Note (Signed)
Otitis of left ear, unable to visualize TM.  Has failed tc with cipro orally and with drops even after wick placed.  Will send to ENT.  Has appt tomorrow.  Advised to continue cipro drops and pills, avoid water.  He declines need for further pain meds

## 2011-02-15 NOTE — Patient Instructions (Signed)
I sent flonase and flovent to your pharmacy Keep using cipro and ear drops Keep appointment with ENT You are due for physical- please schedule appointment

## 2011-02-15 NOTE — Assessment & Plan Note (Signed)
Patient request refills of flovent and albuterol.  Low dose of ICS noted.  Advised him to return for physical, agreed to give refill until able to be seen for next appt.

## 2011-02-25 ENCOUNTER — Other Ambulatory Visit: Payer: Self-pay | Admitting: Family Medicine

## 2011-02-25 NOTE — Telephone Encounter (Signed)
Refill request

## 2011-03-01 NOTE — Telephone Encounter (Signed)
Refill request

## 2011-06-15 ENCOUNTER — Encounter: Payer: Self-pay | Admitting: Family Medicine

## 2011-06-15 ENCOUNTER — Telehealth: Payer: Self-pay | Admitting: Internal Medicine

## 2011-06-15 ENCOUNTER — Ambulatory Visit (INDEPENDENT_AMBULATORY_CARE_PROVIDER_SITE_OTHER): Payer: 59 | Admitting: Family Medicine

## 2011-06-15 VITALS — BP 120/82 | HR 84 | Temp 97.8°F | Ht 73.0 in | Wt 245.8 lb

## 2011-06-15 DIAGNOSIS — Z0000 Encounter for general adult medical examination without abnormal findings: Secondary | ICD-10-CM

## 2011-06-15 MED ORDER — ERYTHROMYCIN 2 % EX GEL: Freq: Two times a day (BID) | CUTANEOUS | Status: AC

## 2011-06-15 MED ORDER — FLUTICASONE PROPIONATE 50 MCG/ACT NA SUSP: 2.0000 | Freq: Every day | NASAL | Status: DC

## 2011-06-15 NOTE — Patient Instructions (Signed)
Dr. Berdine Dance at [facility]

## 2011-06-15 NOTE — Progress Notes (Signed)
Patient Name: Clarence Alvarez Date of Birth: 01/18/1966 Age: 45 y.o. Medical Record Number: [id number] Gender: male  History of Present Illness:  Clarence Alvarez is a 45 y.o. very pleasant male patient who presents with the following:  Preventative Health Maintenance Visit:  Health Maintenance Summary Reviewed and updated, unless pt declines services.  Tobacco History Reviewed. Alcohol: No concerns, no excessive use Exercise Habits: Some activity, normally very active, now with a very tender bunion STD concerns: no risk or activity to increase risk Drug Use: None Encouraged self-testicular check  Health Maintenance  Topic Date Due  . Influenza Vaccine  04/11/2012  . Tetanus/tdap  05/12/2014    Works at [employer].  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  Lives down the street.  On Flovent -  No recent prostate check or CPX. Tinea barbae liquid --   Patient Active Problem List  Diagnoses  . ALLERGIC RHINITIS, SEASONAL  . ASTHMA, PERSISTENT   Past Medical History  Diagnosis Date  . Asthma exacerbation, mild   . Allergic rhinitis due to pollen    Past Surgical History  Procedure Date  . Shoulder arthroscopy w/ rotator cuff repair 2011    Dr. Thurston Hole, RTC repair, labral repair, subacromial decompression   History  Substance Use Topics  . Smoking status: Never Smoker   . Smokeless tobacco: Not on file  . Alcohol Use: Not on file   No family history on file. No Known Allergies Current Outpatient Prescriptions on File Prior to Visit  Medication Sig Dispense Refill  . albuterol (PROVENTIL,VENTOLIN) 90 MCG/ACT inhaler Inhale 2 puffs into the lungs every 6 (six) hours as needed for wheezing.  17 g  2  . fluticasone (FLOVENT HFA) 44 MCG/ACT inhaler Inhale 2 puffs into the lungs 2 (two) times daily.  1 Inhaler  2  . omeprazole (PRILOSEC) 40 MG capsule ONE TABLET BY MOUTH DAILY FOR ACID REFLUX  34 capsule  7     Past Medical History, Surgical  History, Social History, Family History, and Problem List have been reviewed in EHR and updated if relevant.  Review of Systems:   General: Denies fever, chills, sweats. No significant weight loss. Eyes: Denies blurring,significant itching ENT: Denies earache, sore throat, and hoarseness. Cardiovascular: Denies chest pains, palpitations, dyspnea on exertion Respiratory: Denies cough, dyspnea at rest,wheeezing - asthma doing well Breast: no concerns about lumps GI: Denies nausea, vomiting, diarrhea, constipation, change in bowel habits, abdominal pain, melena, hematochezia GU: Denies penile discharge, ED, urinary flow / outflow problems. No STD concerns. Musculoskeletal: as above, L RTC repair within the last year Derm: Denies rash, itching Neuro: Denies  paresthesias, frequent falls, frequent headaches Psych: Denies depression, anxiety Endocrine: Denies cold intolerance, heat intolerance, polydipsia Heme: Denies enlarged lymph nodes Allergy: No hayfever   Physical Examination: Filed Vitals:   06/15/11 1404  BP: 120/82  Pulse: 84  Temp: 97.8 F (36.6 C)  TempSrc: Oral  Height: 6\' 1"  (1.854 m)  Weight: 245 lb 12.8 oz (111.494 kg)  SpO2: 98%    Body mass index is 32.43 kg/(m^2).   Wt Readings from Last 3 Encounters:  06/15/11 245 lb 12.8 oz (111.494 kg)  02/15/11 246 lb (111.585 kg)  01/23/10 251 lb (113.853 kg)    GEN: well developed, well nourished, no acute distress Eyes: conjunctiva and lids normal, PERRLA, EOMI ENT: TM clear, nares clear, oral exam WNL Neck: supple, no lymphadenopathy, no thyromegaly, no JVD Pulm: clear to auscultation and percussion, respiratory effort normal  CV: regular rate and rhythm, S1-S2, no murmur, rub or gallop, no bruits, peripheral pulses normal and symmetric, no cyanosis, clubbing, edema or varicosities Chest: no scars, masses GI: soft, non-tender; no hepatosplenomegaly, masses; active bowel sounds all quadrants GU: no hernia,  testicular mass, penile discharge, or prostate enlargement Lymph: no cervical, axillary or inguinal adenopathy MSK: gait normal, muscle tone and strength WNL, no joint swelling, effusions, discoloration, crepitus  SKIN: clear, good turgor, color WNL, no rashes, lesions, or ulcerations Neuro: normal mental status, normal strength, sensation, and motion Psych: alert; oriented to person, place and time, normally interactive and not anxious or depressed in appearance.   Assessment and Plan: 1. Routine general medical examination at a health care facility  Basic metabolic panel, CBC with Differential, Hepatic function panel, Lipid panel, PSA    The patient's preventative maintenance and recommended screening tests for an annual wellness exam were reviewed in full today. Brought up to date unless services declined.  Counselled on the importance of diet, exercise, and its role in overall health and mortality. The patient's FH and SH was reviewed, including their home life, tobacco status, and drug and alcohol status.   Doing well. Refilled a couple of scripts, check labs

## 2011-06-15 NOTE — Telephone Encounter (Signed)
Patient called back with the Medication name for the gel for facial hair.  The name is Drythromycin Topical Gel 2%.

## 2011-06-15 NOTE — Telephone Encounter (Signed)
i sent refills in for him to [facility]

## 2011-06-16 LAB — LDL CHOLESTEROL, DIRECT: Direct LDL: 141.4 mg/dL

## 2011-06-16 LAB — LIPID PANEL
Cholesterol: 209 mg/dL — ABNORMAL HIGH (ref 0–200)
HDL: 41.9 mg/dL (ref >39.00)
Total CHOL/HDL Ratio: 5
Triglycerides: 94 mg/dL (ref 0.0–149.0)
VLDL: 18.8 mg/dL (ref 0.0–40.0)

## 2011-06-16 LAB — CBC WITH DIFFERENTIAL/PLATELET
Basophils Absolute: 0 10*3/uL (ref 0.0–0.1)
Basophils Relative: 0.4 % (ref 0.0–3.0)
Eosinophils Absolute: 0.2 10*3/uL (ref 0.0–0.7)
Eosinophils Relative: 5 % (ref 0.0–5.0)
HCT: 46.4 % (ref 39.0–52.0)
Hemoglobin: 15.7 g/dL (ref 13.0–17.0)
Lymphocytes Relative: 56.9 % — ABNORMAL HIGH (ref 12.0–46.0)
Lymphs Abs: 2 10*3/uL (ref 0.7–4.0)
MCHC: 33.9 g/dL (ref 30.0–36.0)
MCV: 89.2 fl (ref 78.0–100.0)
Monocytes Absolute: 0.3 10*3/uL (ref 0.1–1.0)
Monocytes Relative: 9.1 % (ref 3.0–12.0)
Neutro Abs: 1 10*3/uL — ABNORMAL LOW (ref 1.4–7.7)
Neutrophils Relative %: 28.6 % — ABNORMAL LOW (ref 43.0–77.0)
Platelets: 181 10*3/uL (ref 150.0–400.0)
RBC: 5.2 Mil/uL (ref 4.22–5.81)
RDW: 13.6 % (ref 11.5–14.6)
WBC: 3.5 10*3/uL — ABNORMAL LOW (ref 4.5–10.5)

## 2011-06-16 LAB — HEPATIC FUNCTION PANEL
ALT: 44 U/L (ref 0–53)
AST: 58 U/L — ABNORMAL HIGH (ref 0–37)
Albumin: 4.4 g/dL (ref 3.5–5.2)
Alkaline Phosphatase: 82 U/L (ref 39–117)
Bilirubin, Direct: 0.1 mg/dL (ref 0.0–0.3)
Total Bilirubin: 0.8 mg/dL (ref 0.3–1.2)
Total Protein: 7.4 g/dL (ref 6.0–8.3)

## 2011-06-16 LAB — PSA: PSA: 0.4 ng/mL (ref 0.10–4.00)

## 2011-06-16 LAB — BASIC METABOLIC PANEL
BUN: 15 mg/dL (ref 6–23)
CO2: 33 mEq/L — ABNORMAL HIGH (ref 19–32)
Calcium: 9.5 mg/dL (ref 8.4–10.5)
Chloride: 100 mEq/L (ref 96–112)
Creatinine, Ser: 1.4 mg/dL (ref 0.4–1.5)
GFR: 72.24 mL/min (ref >60.00)
Glucose, Bld: 101 mg/dL — ABNORMAL HIGH (ref 70–99)
Potassium: 4 mEq/L (ref 3.5–5.1)
Sodium: 139 mEq/L (ref 135–145)

## 2011-06-17 ENCOUNTER — Encounter: Payer: Self-pay | Admitting: Family Medicine

## 2011-06-17 ENCOUNTER — Encounter: Payer: Self-pay | Admitting: *Deleted

## 2011-09-29 ENCOUNTER — Encounter: Payer: Self-pay | Admitting: Family Medicine

## 2011-09-29 ENCOUNTER — Telehealth: Payer: Self-pay | Admitting: Family Medicine

## 2011-09-29 ENCOUNTER — Ambulatory Visit (INDEPENDENT_AMBULATORY_CARE_PROVIDER_SITE_OTHER): Payer: 59 | Admitting: Family Medicine

## 2011-09-29 VITALS — BP 130/80 | HR 62 | Temp 98.1°F | Ht 73.0 in | Wt 254.1 lb

## 2011-09-29 DIAGNOSIS — J320 Chronic maxillary sinusitis: Secondary | ICD-10-CM

## 2011-09-29 DIAGNOSIS — J45901 Unspecified asthma with (acute) exacerbation: Secondary | ICD-10-CM

## 2011-09-29 MED ORDER — ALBUTEROL SULFATE HFA 108 (90 BASE) MCG/ACT IN AERS: 2.0000 | INHALATION_SPRAY | Freq: Four times a day (QID) | RESPIRATORY_TRACT | Status: DC | PRN

## 2011-09-29 MED ORDER — AZITHROMYCIN 250 MG PO TABS: ORAL_TABLET | ORAL | Status: AC

## 2011-09-29 NOTE — Telephone Encounter (Signed)
Caller: Jeff/Patient; PCP: Hannah Beat T.; CB#: [phone]; ; ; Call regarding ?Marland Kitchen Sinus Infx; onset 1 week ago.  c/o ongoing headache > 24 hours, and feels the congestion is worsening.  Increasing wheeze noted beginniing PM 09/28/11; using flovent but cannot find his albuterol pump.  Denies resp distress or severe wheezing;  Afebrile.  Per protocol, emergent symptoms denied; advised appt within 24 hours.  Appt sched 09/29/11 1230 with Dr. Patsy Lager.

## 2011-09-29 NOTE — Progress Notes (Signed)
  Patient Name: Clarence Alvarez Date of Birth: [birth year] Age: 46 y.o. Medical Record Number: [id number] Gender: male Date of Encounter: 09/29/2011  History of Present Illness:  Clarence Alvarez is a 46 y.o. very pleasant male patient who presents with the following:  Allergies are flared up a lot, sinuses are hurting a lot, head is burning a lot. Watery eyes, nasal congestion down into his chest. No body aches.   Long history of allergy problems and allergy symptoms. He is having somewhat difficult to taking a deep breath occasionally with some sensation of wheezing, but he ran out of albuterol. He overall is doing very well.  He has been afebrile. No nausea, vomiting, or diarrhea. No arthralgias or myalgias.   Past Medical History, Surgical History, Social History, Family History, Problem List, Medications, and Allergies have been reviewed and updated if relevant.  Review of Systems: ROS: GEN: Acute illness details above GI: Tolerating PO intake GU: maintaining adequate hydration and urination Pulm: No SOB Interactive and getting along well at home.  Otherwise, ROS is as per the HPI.   Physical Examination: Filed Vitals:   09/29/11 1242  BP: 130/80  Pulse: 62  Temp: 98.1 F (36.7 C)  TempSrc: Oral  Height: 6\' 1"  (1.854 m)  Weight: 254 lb 1.9 oz (115.268 kg)  SpO2: 98%    Body mass index is 33.53 kg/(m^2).   Gen: WDWN, NAD; alert,appropriate and cooperative throughout exam  HEENT: Normocephalic and atraumatic. Throat clear, w/o exudate, no LAD, R TM clear, L TM - good landmarks, No fluid present. rhinnorhea.  Left frontal and maxillary sinuses: Tender max Right frontal and maxillary sinuses: Tender max  Neck: No ant or post LAD CV: RRR, No M/G/R Pulm: Breathing comfortably in no resp distress. no w/c/r Abd: S,NT,ND,+BS Extr: no c/c/e Psych: full affect, pleasant   Assessment and Plan:  1. Sinusitis, maxillary, chronic   2. Asthma exacerbation     Orders  Today: No orders of the defined types were placed in this encounter.    Medications Today: Meds ordered this encounter  Medications  . cetirizine (ZYRTEC) 10 MG tablet    Sig: Take 10 mg by mouth daily.  Marland Kitchen albuterol (PROVENTIL HFA;VENTOLIN HFA) 108 (90 BASE) MCG/ACT inhaler    Sig: Inhale 2 puffs into the lungs every 6 (six) hours as needed for wheezing.    Dispense:  1 Inhaler    Refill:  3  . azithromycin (ZITHROMAX Z-PAK) 250 MG tablet    Sig: Take 2 tablets (500 mg) on  Day 1,  followed by 1 tablet (250 mg) once daily on Days 2 through 5.    Dispense:  6 each    Refill:  0    Maxillary sinusitis with mild asthma flare. Given albuterol to use p.r.n.

## 2012-01-18 ENCOUNTER — Other Ambulatory Visit: Payer: Self-pay | Admitting: *Deleted

## 2012-01-18 MED ORDER — OMEPRAZOLE 40 MG PO CPDR: 40.0000 mg | DELAYED_RELEASE_CAPSULE | Freq: Every day | ORAL | Status: DC

## 2012-08-01 ENCOUNTER — Other Ambulatory Visit: Payer: Self-pay | Admitting: *Deleted

## 2012-08-01 DIAGNOSIS — Z0000 Encounter for general adult medical examination without abnormal findings: Secondary | ICD-10-CM

## 2012-08-01 DIAGNOSIS — J45909 Unspecified asthma, uncomplicated: Secondary | ICD-10-CM

## 2012-08-01 MED ORDER — FLUTICASONE PROPIONATE 50 MCG/ACT NA SUSP: 2.0000 | Freq: Every day | NASAL | Status: DC

## 2012-08-01 MED ORDER — FLUTICASONE PROPIONATE HFA 44 MCG/ACT IN AERO: 2.0000 | INHALATION_SPRAY | Freq: Two times a day (BID) | RESPIRATORY_TRACT | Status: DC

## 2012-11-14 ENCOUNTER — Other Ambulatory Visit: Payer: Self-pay | Admitting: Family Medicine

## 2012-12-13 ENCOUNTER — Other Ambulatory Visit: Payer: Self-pay | Admitting: Family Medicine

## 2013-01-16 ENCOUNTER — Other Ambulatory Visit: Payer: Self-pay | Admitting: Family Medicine

## 2013-01-22 ENCOUNTER — Ambulatory Visit (INDEPENDENT_AMBULATORY_CARE_PROVIDER_SITE_OTHER): Payer: Commercial Managed Care - PPO | Admitting: Family Medicine

## 2013-01-22 ENCOUNTER — Encounter: Payer: Self-pay | Admitting: Family Medicine

## 2013-01-22 VITALS — BP 130/90 | HR 73 | Temp 97.9°F | Ht 73.0 in | Wt 237.0 lb

## 2013-01-22 DIAGNOSIS — G479 Sleep disorder, unspecified: Secondary | ICD-10-CM

## 2013-01-22 DIAGNOSIS — Z0000 Encounter for general adult medical examination without abnormal findings: Secondary | ICD-10-CM

## 2013-01-22 DIAGNOSIS — Z125 Encounter for screening for malignant neoplasm of prostate: Secondary | ICD-10-CM

## 2013-01-22 DIAGNOSIS — M7741 Metatarsalgia, right foot: Secondary | ICD-10-CM

## 2013-01-22 DIAGNOSIS — Z13220 Encounter for screening for lipoid disorders: Secondary | ICD-10-CM

## 2013-01-22 DIAGNOSIS — R5381 Other malaise: Secondary | ICD-10-CM

## 2013-01-22 DIAGNOSIS — Z131 Encounter for screening for diabetes mellitus: Secondary | ICD-10-CM

## 2013-01-22 DIAGNOSIS — M7750 Other enthesopathy of unspecified foot: Secondary | ICD-10-CM

## 2013-01-22 DIAGNOSIS — M7742 Metatarsalgia, left foot: Secondary | ICD-10-CM

## 2013-01-22 LAB — HEPATIC FUNCTION PANEL
ALT: 46 U/L (ref 0–53)
AST: 54 U/L — ABNORMAL HIGH (ref 0–37)
Albumin: 4.5 g/dL (ref 3.5–5.2)
Alkaline Phosphatase: 75 U/L (ref 39–117)
Bilirubin, Direct: 0.1 mg/dL (ref 0.0–0.3)
Total Bilirubin: 1 mg/dL (ref 0.3–1.2)
Total Protein: 7.7 g/dL (ref 6.0–8.3)

## 2013-01-22 LAB — CBC WITH DIFFERENTIAL/PLATELET
Basophils Absolute: 0 10*3/uL (ref 0.0–0.1)
Basophils Relative: 0.6 % (ref 0.0–3.0)
Eosinophils Absolute: 0.2 10*3/uL (ref 0.0–0.7)
Eosinophils Relative: 3.8 % (ref 0.0–5.0)
HCT: 47.2 % (ref 39.0–52.0)
Hemoglobin: 15.9 g/dL (ref 13.0–17.0)
Lymphocytes Relative: 51.9 % — ABNORMAL HIGH (ref 12.0–46.0)
Lymphs Abs: 2.1 10*3/uL (ref 0.7–4.0)
MCHC: 33.7 g/dL (ref 30.0–36.0)
MCV: 89.9 fl (ref 78.0–100.0)
Monocytes Absolute: 0.4 10*3/uL (ref 0.1–1.0)
Monocytes Relative: 10 % (ref 3.0–12.0)
Neutro Abs: 1.3 10*3/uL — ABNORMAL LOW (ref 1.4–7.7)
Neutrophils Relative %: 33.7 % — ABNORMAL LOW (ref 43.0–77.0)
Platelets: 184 10*3/uL (ref 150.0–400.0)
RBC: 5.25 Mil/uL (ref 4.22–5.81)
RDW: 13.3 % (ref 11.5–14.6)
WBC: 4 10*3/uL — ABNORMAL LOW (ref 4.5–10.5)

## 2013-01-22 LAB — LIPID PANEL
Cholesterol: 199 mg/dL (ref 0–200)
HDL: 39.8 mg/dL (ref >39.00)
LDL Cholesterol: 134 mg/dL — ABNORMAL HIGH (ref 0–99)
Total CHOL/HDL Ratio: 5
Triglycerides: 124 mg/dL (ref 0.0–149.0)
VLDL: 24.8 mg/dL (ref 0.0–40.0)

## 2013-01-22 LAB — BASIC METABOLIC PANEL
BUN: 16 mg/dL (ref 6–23)
CO2: 31 mEq/L (ref 19–32)
Calcium: 9.7 mg/dL (ref 8.4–10.5)
Chloride: 102 mEq/L (ref 96–112)
Creatinine, Ser: 1.2 mg/dL (ref 0.4–1.5)
GFR: 82.11 mL/min (ref >60.00)
Glucose, Bld: 90 mg/dL (ref 70–99)
Potassium: 4.6 mEq/L (ref 3.5–5.1)
Sodium: 138 mEq/L (ref 135–145)

## 2013-01-22 LAB — PSA: PSA: 0.43 ng/mL (ref 0.10–4.00)

## 2013-01-22 MED ORDER — OMEPRAZOLE 40 MG PO CPDR: DELAYED_RELEASE_CAPSULE | ORAL | Status: DC

## 2013-01-22 NOTE — Progress Notes (Signed)
Nature conservation officer at [facility] [address] [place] [address] Phone: [phone] Fax: [phone]  Date:  01/22/2013   Name:  Clarence Alvarez   DOB:  1965-09-04   MRN:  [id number] Gender: male Age: 47 y.o.  Primary Physician:  Hannah Beat, MD  Evaluating MD: Hannah Beat, MD   Chief Complaint: Annual Exam   History of Present Illness:  Clarence Alvarez is a 47 y.o. pleasant patient who presents with the following:  CPX:  Snores really loud and at least a few times a week will gasp for air. Then will stop sometimes for a few minutes. Does not feel all that well-resed.  Overall, feels well.  Wife notices it.   Has had some PF in the left heel.   Labwork  INSOLE WITH FOREFOOT PAD.  MINIMAlly using the albuterol  Preventative Health Maintenance Visit:  Health Maintenance Summary Reviewed and updated, unless pt declines services.  Tobacco History Reviewed. Alcohol: No concerns, no excessive use Exercise Habits: power walking 3-4 times a week STD concerns: no risk or activity to increase risk Drug Use: None Encouraged self-testicular check  Health Maintenance  Topic Date Due  . Influenza Vaccine  03/12/2013  . Tetanus/tdap  05/12/2014    Labs reviewed with the patient.  Results for orders placed in visit on 01/22/13  BASIC METABOLIC PANEL      Result Value Range   Sodium 138  135 - 145 mEq/L   Potassium 4.6  3.5 - 5.1 mEq/L   Chloride 102  96 - 112 mEq/L   CO2 31  19 - 32 mEq/L   Glucose, Bld 90  70 - 99 mg/dL   BUN 16  6 - 23 mg/dL   Creatinine, Ser 1.2  0.4 - 1.5 mg/dL   Calcium 9.7  8.4 - 86.5 mg/dL   GFR 78.46  >96.29 mL/min  CBC WITH DIFFERENTIAL      Result Value Range   WBC 4.0 (*) 4.5 - 10.5 K/uL   RBC 5.25  4.22 - 5.81 Mil/uL   Hemoglobin 15.9  13.0 - 17.0 g/dL   HCT 52.8  41.3 - 24.4 %   MCV 89.9  78.0 - 100.0 fl   MCHC 33.7  30.0 - 36.0 g/dL   RDW 01.0  27.2 - 53.6 %   Platelets 184.0  150.0 - 400.0 K/uL   Neutrophils Relative % 33.7 (*) 43.0 - 77.0 %   Lymphocytes Relative 51.9 (*) 12.0 - 46.0 %   Monocytes Relative 10.0  3.0 - 12.0 %   Eosinophils Relative 3.8  0.0 - 5.0 %   Basophils Relative 0.6  0.0 - 3.0 %   Neutro Abs 1.3 (*) 1.4 - 7.7 K/uL   Lymphs Abs 2.1  0.7 - 4.0 K/uL   Monocytes Absolute 0.4  0.1 - 1.0 K/uL   Eosinophils Absolute 0.2  0.0 - 0.7 K/uL   Basophils Absolute 0.0  0.0 - 0.1 K/uL  HEPATIC FUNCTION PANEL      Result Value Range   Total Bilirubin 1.0  0.3 - 1.2 mg/dL   Bilirubin, Direct 0.1  0.0 - 0.3 mg/dL   Alkaline Phosphatase 75  39 - 117 U/L   AST 54 (*) 0 - 37 U/L   ALT 46  0 - 53 U/L   Total Protein 7.7  6.0 - 8.3 g/dL   Albumin 4.5  3.5 - 5.2 g/dL  LIPID PANEL      Result Value Range  Cholesterol 199  0 - 200 mg/dL   Triglycerides 161.0  0.0 - 149.0 mg/dL   HDL 96.04  >54.09 mg/dL   VLDL 81.1  0.0 - 91.4 mg/dL   LDL Cholesterol 782 (*) 0 - 99 mg/dL   Total CHOL/HDL Ratio 5    PSA      Result Value Range   PSA 0.43  0.10 - 4.00 ng/mL      Patient Active Problem List   Diagnosis Date Noted  . ALLERGIC RHINITIS, SEASONAL 09/15/2009  . ASTHMA, PERSISTENT 09/08/2006    Past Medical History  Diagnosis Date  . Asthma exacerbation, mild   . Allergic rhinitis due to pollen     Past Surgical History  Procedure Laterality Date  . Shoulder arthroscopy w/ rotator cuff repair  2011    Dr. Thurston Hole, RTC repair, labral repair, subacromial decompression    History   Social History  . Marital Status: Married    Spouse Name: N/A    Number of Children: N/A  . Years of Education: N/A   Occupational History  .      Head maintenance at [employer]   Social History Main Topics  . Smoking status: Never Smoker   . Smokeless tobacco: Not on file  . Alcohol Use: Not on file  . Drug Use: Not on file  . Sexually Active: Not on file   Other Topics Concern  . Not on file   Social History Narrative   Wife is a Publishing rights manager at the [employer]    No family history on file.  No Known Allergies  Medication list has been reviewed and updated.  Outpatient Prescriptions Prior to Visit  Medication Sig Dispense Refill  . cetirizine (ZYRTEC) 10 MG tablet Take 10 mg by mouth daily.      . fluticasone (FLONASE) 50 MCG/ACT nasal spray Place 2 sprays into the nose daily.  16 g  2  . fluticasone (FLOVENT HFA) 44 MCG/ACT inhaler Inhale 2 puffs into the lungs 2 (two) times daily.  1 Inhaler  2  . omeprazole (PRILOSEC) 40 MG capsule TAKE 1 CAPSULE BY MOUTH ONCE A DAY  30 capsule  0   No facility-administered medications prior to visit.    Review of Systems:   General: Denies fever, chills, sweats. No significant weight loss. Eyes: Denies blurring,significant itching ENT: Denies earache, sore throat, and hoarseness. Cardiovascular: Denies chest pains, palpitations, dyspnea on exertion Respiratory: Denies cough, dyspnea at rest,wheeezing Breast: no concerns about lumps GI: Denies nausea, vomiting, diarrhea, constipation, change in bowel habits, abdominal pain, melena, hematochezia GU: Denies penile discharge, ED, urinary flow / outflow problems. No STD concerns. Musculoskeletal: Denies back pain, joint pain, FOREFOOT PAIN AND SOME HEEL PAIN Derm: Denies rash, itching Neuro: Denies  paresthesias, frequent falls, frequent headaches Psych: Denies depression, anxiety Endocrine: Denies cold intolerance, heat intolerance, polydipsia Heme: Denies enlarged lymph nodes Allergy: No hayfever   Physical Examination: BP 130/90  Pulse 73  Temp(Src) 97.9 F (36.6 C) (Oral)  Ht 6\' 1"  (1.854 m)  Wt 237 lb (107.502 kg)  BMI 31.27 kg/m2  SpO2 97%  Ideal Body Weight: Weight in (lb) to have BMI = 25: 189.1  GEN: well developed, well nourished, no acute distress Eyes: conjunctiva and lids normal, PERRLA, EOMI ENT: TM clear, nares clear, oral exam WNL Neck: supple, no lymphadenopathy, no thyromegaly, no JVD Pulm: clear to auscultation and  percussion, respiratory effort normal CV: regular rate and rhythm, S1-S2, no murmur, rub or  gallop, no bruits, peripheral pulses normal and symmetric, no cyanosis, clubbing, edema or varicosities GI: soft, non-tender; no hepatosplenomegaly, masses; active bowel sounds all quadrants GU: no hernia, testicular mass, penile discharge Lymph: no cervical, axillary or inguinal adenopathy MSK: gait normal, muscle tone and strength WNL, no joint swelling, effusions, discoloration, crepitus. B TTP dropped MT heads with significant bunions, bunionette and forefoot breakdown. SKIN: clear, good turgor, color WNL, no rashes, lesions, or ulcerations Neuro: normal mental status, normal strength, sensation, and motion Psych: alert; oriented to person, place and time, normally interactive and not anxious or depressed in appearance.  Assessment and Plan:  Routine general medical examination at a health care facility  Screening for diabetes mellitus - Plan: Basic metabolic panel  Screening for lipoid disorders - Plan: Lipid panel  Special screening for malignant neoplasm of prostate - Plan: PSA  Other malaise and fatigue - Plan: CBC with Differential, Hepatic function panel  Sleep disturbance - Plan: Ambulatory referral to Pulmonology  The patient's preventative maintenance and recommended screening tests for an annual wellness exam were reviewed in full today. Brought up to date unless services declined.  Counselled on the importance of diet, exercise, and its role in overall health and mortality. The patient's FH and SH was reviewed, including their home life, tobacco status, and drug and alcohol status.   Doing well Check labs Clinical concern for OSA, consult pulm Metatarsal pads made out of poron for patient, placed on sports insole  Orders Today:  Orders Placed This Encounter  Procedures  . Basic metabolic panel  . CBC with Differential  . Hepatic function panel  . Lipid panel  . PSA  .  Ambulatory referral to Pulmonology    Referral Priority:  Routine    Referral Type:  Consultation    Referral Reason:  Specialty Services Required    Requested Specialty:  Pulmonary Disease    Number of Visits Requested:  1    Updated Medication List: (Includes new medications, updates to list, dose adjustments) Meds ordered this encounter  Medications  . Multiple Vitamin (MULTIVITAMIN) tablet    Sig: Take 1 tablet by mouth daily.  Marland Kitchen omeprazole (PRILOSEC) 40 MG capsule    Sig: TAKE 1 CAPSULE BY MOUTH ONCE A DAY    Dispense:  30 capsule    Refill:  11    Medications Discontinued: Medications Discontinued During This Encounter  Medication Reason  . omeprazole (PRILOSEC) 40 MG capsule Reorder      Signed, Karleen Hampshire T. Arpita Fentress, MD 01/22/2013 12:15 PM

## 2013-01-22 NOTE — Patient Instructions (Signed)
REFERRAL: GO THE THE FRONT ROOM AT THE ENTRANCE OF OUR CLINIC, NEAR CHECK IN. ASK FOR MARION. SHE WILL HELP YOU SET UP YOUR REFERRAL. DATE: TIME:  

## 2013-01-23 ENCOUNTER — Encounter: Payer: Self-pay | Admitting: *Deleted

## 2013-01-26 ENCOUNTER — Ambulatory Visit (INDEPENDENT_AMBULATORY_CARE_PROVIDER_SITE_OTHER): Payer: Commercial Managed Care - PPO | Admitting: Pulmonary Disease

## 2013-01-26 ENCOUNTER — Encounter: Payer: Self-pay | Admitting: Pulmonary Disease

## 2013-01-26 VITALS — BP 132/90 | HR 82 | Temp 98.0°F | Ht 73.0 in | Wt 242.0 lb

## 2013-01-26 DIAGNOSIS — R0609 Other forms of dyspnea: Secondary | ICD-10-CM

## 2013-01-26 DIAGNOSIS — R0989 Other specified symptoms and signs involving the circulatory and respiratory systems: Secondary | ICD-10-CM

## 2013-01-26 DIAGNOSIS — G4733 Obstructive sleep apnea (adult) (pediatric): Secondary | ICD-10-CM | POA: Insufficient documentation

## 2013-01-26 DIAGNOSIS — R0683 Snoring: Secondary | ICD-10-CM

## 2013-01-26 NOTE — Patient Instructions (Addendum)
Will schedule for home sleep testing, and will call once results are available. Work on weight loss.  

## 2013-01-26 NOTE — Progress Notes (Signed)
Subjective:    Patient ID: Clarence Alvarez, male    DOB: 1965/09/08, 47 y.o.   MRN: [id number]  HPI The patient is a 47 year old male who been asked to see for possible obstructive sleep apnea.  He has been noted to have loud snoring as well as an abnormal breathing pattern during sleep by his bed partner.  The patient does have occasional choking arousals, but blames this on reflux disease.  He denies frequent awakenings at night, and feels that he is fairly rested in the mornings upon arising.  However, he notes definite sleepiness during the day with inactivity, and can actually fall asleep.  He denies any significant sleepiness issues in the evenings watching television or movies, but does have some sleep pressure with driving longer distances.  The patient states that his weight is down 15 pounds over the last 2 years, and his Epworth score today is 9.  Sleep Questionnaire What time do you typically go to bed?( Between what hours) 11p-12a 11p-12a at 1541 on 01/26/13 by Nita Sells, CMA How long does it take you to fall asleep? 20-18mins 20-40mins at 1541 on 01/26/13 by Nita Sells, CMA How many times during the night do you wake up? 2 2 at 1541 on 01/26/13 by Nita Sells, CMA What time do you get out of bed to start your day? [id number] at 1541 on 01/26/13 by Nita Sells, CMA Do you drive or operate heavy machinery in your occupation? No No at 1541 on 01/26/13 by Nita Sells, CMA How much has your weight changed (up or down) over the past two years? (In pounds) 15 lb (6.804 kg)15 lb (6.804 kg) decreased at 1541 on 01/26/13 by Nita Sells, CMA Have you ever had a sleep study before? No No at 1541 on 01/26/13 by Marjo Bicker Mabe, CMA Do you currently use CPAP? No No at 1541 on 01/26/13 by Marjo Bicker Mabe, CMA Do you wear oxygen at any time? No No at 1541 on 01/26/13 by Marjo Bicker Mabe, CMA    Review of Systems  Constitutional: Negative for fever and unexpected weight  change.  HENT: Negative for ear pain, nosebleeds, congestion, sore throat, rhinorrhea, sneezing, trouble swallowing, dental problem, postnasal drip and sinus pressure.        Allergies  Eyes: Negative for redness and itching.  Respiratory: Negative for cough, chest tightness, shortness of breath and wheezing.   Cardiovascular: Negative for palpitations and leg swelling.  Gastrointestinal: Negative for nausea and vomiting.  Genitourinary: Negative for dysuria.  Musculoskeletal: Negative for joint swelling.  Skin: Negative for rash.  Neurological: Negative for headaches.  Hematological: Does not bruise/bleed easily.  Psychiatric/Behavioral: Negative for dysphoric mood. The patient is not nervous/anxious.        Objective:   Physical Exam Constitutional:  Overweight male, no acute distress  HENT:  Nares patent without discharge, enlarged turbinates.   Oropharynx without exudate, palate and uvula are mildly elongated.   Eyes:  Perrla, eomi, no scleral icterus  Neck:  No JVD, no TMG  Cardiovascular:  Normal rate, regular rhythm, no rubs or gallops.  No murmurs        Intact distal pulses  Pulmonary :  Normal breath sounds, no stridor or respiratory distress   No rales, rhonchi, or wheezing  Abdominal:  Soft, nondistended, bowel sounds present.  No tenderness noted.   Musculoskeletal:  No lower extremity edema noted.  Lymph Nodes:  No cervical lymphadenopathy noted  Skin:  No cyanosis noted  Neurologic:  Alert, appropriate, moves all 4 extremities without obvious deficit.         Assessment & Plan:

## 2013-01-26 NOTE — Assessment & Plan Note (Signed)
The patient's history is suspicious for clinically significant sleep apnea, and I think there is more than enough concern to justify proceeding with a sleep study.  It had a long discussion with him and his wife about sleep apnea, including its impact to his quality of life and cardiovascular health.  I will arrange for home sleep testing, and see him back once the results are available.  I have also encouraged him to work aggressively on weight loss.

## 2013-02-09 ENCOUNTER — Telehealth: Payer: Self-pay | Admitting: Pulmonary Disease

## 2013-02-09 NOTE — Telephone Encounter (Signed)
Pt was to have a home sleep test. Please advise PCC's thanks 

## 2013-02-12 NOTE — Telephone Encounter (Signed)
Called and spoke with Clarence Alvarez at [facility]. [address] does not require pre cert and is a covered code. Ref # [id number]. Pt has been contacted and has been scheduled to pick up home sleep study on Wed. 02/14/13.  Pt is aware to pick up device after 2:00 pm on Wed. 02/14/13. [name]

## 2013-02-14 DIAGNOSIS — G4710 Hypersomnia, unspecified: Secondary | ICD-10-CM

## 2013-02-14 DIAGNOSIS — R0609 Other forms of dyspnea: Secondary | ICD-10-CM

## 2013-02-14 DIAGNOSIS — R0989 Other specified symptoms and signs involving the circulatory and respiratory systems: Secondary | ICD-10-CM

## 2013-02-23 ENCOUNTER — Encounter: Payer: Self-pay | Admitting: Pulmonary Disease

## 2013-02-23 ENCOUNTER — Telehealth: Payer: Self-pay | Admitting: Pulmonary Disease

## 2013-02-23 DIAGNOSIS — I498 Other specified cardiac arrhythmias: Secondary | ICD-10-CM

## 2013-02-23 DIAGNOSIS — G4733 Obstructive sleep apnea (adult) (pediatric): Secondary | ICD-10-CM

## 2013-02-23 NOTE — Telephone Encounter (Signed)
Pt needs ov to review sleep study results.  

## 2013-02-26 NOTE — Telephone Encounter (Signed)
LMOM x 1 

## 2013-03-08 NOTE — Telephone Encounter (Addendum)
LMOM x 2 

## 2013-03-14 NOTE — Telephone Encounter (Signed)
Pt scheduled for appt with [facility] 03/15/13 at 12p

## 2013-03-15 ENCOUNTER — Ambulatory Visit (INDEPENDENT_AMBULATORY_CARE_PROVIDER_SITE_OTHER): Payer: Commercial Managed Care - PPO | Admitting: Pulmonary Disease

## 2013-03-15 ENCOUNTER — Encounter: Payer: Self-pay | Admitting: Pulmonary Disease

## 2013-03-15 VITALS — BP 126/72 | HR 71 | Temp 97.4°F | Ht 73.0 in | Wt 242.6 lb

## 2013-03-15 DIAGNOSIS — G4733 Obstructive sleep apnea (adult) (pediatric): Secondary | ICD-10-CM

## 2013-03-15 NOTE — Assessment & Plan Note (Signed)
The patient has mild obstructive sleep apnea by his recent sleep study, and I had a long discussion with him about potential treatments.  Given its mild nature, he can make a decision to treat this conservatively with weight loss and positional therapy.  I do not think this represents a significant cardiovascular risk for him.  If he wishes to treat this more aggressively, this can include a dental appliance and CPAP.  At this point, the patient would like to take the next few months and try to work hard on weight loss.  He will call me to try CPAP if he changes his mind, or if he does not have success with weight loss.

## 2013-03-15 NOTE — Progress Notes (Signed)
  Subjective:    Patient ID: Clarence Alvarez, male    DOB: Apr 15, 1966, 46 y.o.   MRN: [id number]  HPI The patient comes in today for followup of his recent sleep study.  He was found to have mild obstructive sleep apnea, with an AHI of 10 events per hour.  I have reviewed the study with him in detail, and answered all of his questions.   Review of Systems  Constitutional: Negative for fever and unexpected weight change.  HENT: Negative for ear pain, nosebleeds, congestion, sore throat, rhinorrhea, sneezing, trouble swallowing, dental problem, postnasal drip and sinus pressure.   Eyes: Negative for redness and itching.  Respiratory: Negative for cough, chest tightness, shortness of breath and wheezing.   Cardiovascular: Negative for palpitations and leg swelling.  Gastrointestinal: Negative for nausea and vomiting.  Genitourinary: Negative for dysuria.  Musculoskeletal: Negative for joint swelling.  Skin: Negative for rash.  Neurological: Negative for headaches.  Hematological: Does not bruise/bleed easily.  Psychiatric/Behavioral: Negative for dysphoric mood. The patient is not nervous/anxious.        Objective:   Physical Exam Overweight male in no acute distress Nose without purulence or discharge noted Neck without lymphadenopathy or thyromegaly Lower extremities without edema, no cyanosis Alert and oriented, moves all 4 extremities.       Assessment & Plan:

## 2013-03-15 NOTE — Patient Instructions (Addendum)
Work on weight loss, and try to stay off your back while sleeping. Please call if you wish to treat your mild sleep apnea more aggressively with cpap.

## 2013-04-02 ENCOUNTER — Ambulatory Visit (INDEPENDENT_AMBULATORY_CARE_PROVIDER_SITE_OTHER): Payer: Commercial Managed Care - PPO | Admitting: Internal Medicine

## 2013-04-02 ENCOUNTER — Encounter: Payer: Self-pay | Admitting: Internal Medicine

## 2013-04-02 VITALS — BP 132/80 | HR 62 | Temp 97.8°F | Wt 239.0 lb

## 2013-04-02 DIAGNOSIS — S63509A Unspecified sprain of unspecified wrist, initial encounter: Secondary | ICD-10-CM

## 2013-04-02 DIAGNOSIS — S63502A Unspecified sprain of left wrist, initial encounter: Secondary | ICD-10-CM | POA: Insufficient documentation

## 2013-04-02 DIAGNOSIS — H6092 Unspecified otitis externa, left ear: Secondary | ICD-10-CM

## 2013-04-02 DIAGNOSIS — H60399 Other infective otitis externa, unspecified ear: Secondary | ICD-10-CM

## 2013-04-02 MED ORDER — CIPROFLOXACIN-HYDROCORTISONE 0.2-1 % OT SUSP: 3.0000 [drp] | Freq: Two times a day (BID) | OTIC | Status: DC

## 2013-04-02 NOTE — Patient Instructions (Signed)
Please call later in the week if your ear isn't better  Use naproxen 440mg  twice a day over the next couple of weeks.  If you are not better, we can refer you to a hand specialist or have Dr Patsy Lager take a look at it

## 2013-04-02 NOTE — Assessment & Plan Note (Signed)
Some redness of TM but I think it is all external Has had this before and used ciprodex (he thinks) Will try this Consider oral antibiotic if not getting any better (due to some TM redness)

## 2013-04-02 NOTE — Assessment & Plan Note (Signed)
May have small synovial cyst also Discussed ice if it is painful Naproxen  Consider hand referral

## 2013-04-02 NOTE — Progress Notes (Signed)
Subjective:    Patient ID: Clarence Alvarez, male    DOB: 11/19/1965, 47 y.o.   MRN: [id number]  HPI Here with wife  Having some left ear pain---throbbing on the inside Some drainage also Having head pain from this Feels his equilibrium is off a little Started yesterday  Some itching for 2 weeks but pain only just started yesterday Now swollen Past swimmers ear--uses ear plugs with showering (but has forgotten at times)  No fever  Hasn't tried any rx  Also, injured left wrist about 1  month ago Carrying something heavy with someone and he dropped it---twisting left wrist Bad pain but didn't ice it No meds Has used a lightweight brace  Current Outpatient Prescriptions on File Prior to Visit  Medication Sig Dispense Refill  . cetirizine (ZYRTEC) 10 MG tablet Take 10 mg by mouth daily.      . fluticasone (FLONASE) 50 MCG/ACT nasal spray Place 2 sprays into the nose daily.  16 g  2  . fluticasone (FLOVENT HFA) 44 MCG/ACT inhaler Inhale 2 puffs into the lungs 2 (two) times daily.  1 Inhaler  2  . omeprazole (PRILOSEC) 40 MG capsule TAKE 1 CAPSULE BY MOUTH ONCE A DAY  30 capsule  11  . [DISCONTINUED] albuterol (PROVENTIL,VENTOLIN) 90 MCG/ACT inhaler Inhale 2 puffs into the lungs every 6 (six) hours as needed for wheezing.  17 g  2   No current facility-administered medications on file prior to visit.    No Known Allergies  Past Medical History  Diagnosis Date  . Asthma exacerbation, mild   . Allergic rhinitis due to pollen   . Plantar fasciitis     left foot    Past Surgical History  Procedure Laterality Date  . Shoulder arthroscopy w/ rotator cuff repair  2011    Dr. Thurston Hole, RTC repair, labral repair, subacromial decompression    Family History  Problem Relation Age of Onset  . Diabetes Paternal Grandmother   . Diabetes Maternal Grandmother   . Mental illness Brother   . Mental illness Sister   . Mental illness Daughter   . Colon cancer Maternal Grandmother    . Stomach cancer Maternal Grandfather   . COPD Other     Aunt  . Diabetes Other     siblings    History   Social History  . Marital Status: Married    Spouse Name: N/A    Number of Children: N/A  . Years of Education: N/A   Occupational History  . facility and grounds manager     Head maintenance at [employer]   Social History Main Topics  . Smoking status: Former Smoker -- 3 years    Quit date: 07/12/1974  . Smokeless tobacco: Never Used     Comment: "EXPERIMENTED" W/SMOKING--NOT EVEN A PACK X 1 WEEK.   . Alcohol Use: Yes     Comment: 1-2 drinks per week  . Drug Use: No  . Sexual Activity: Not on file   Other Topics Concern  . Not on file   Social History Narrative   Wife is a Publishing rights manager at the [employer]   Review of Systems No cough No SOB No sore throat    Objective:   Physical Exam  Constitutional: He appears well-developed and well-nourished. No distress.  HENT:  Mouth/Throat: Oropharynx is clear and moist. No oropharyngeal exudate.  No tragal tenderness either side Mild swelling in left external canal Mild redness along medial left TM Right is normal  No nasal inflammation  Neck: Normal range of motion. Neck supple.  Musculoskeletal:  Mild swelling over radial left wrist Not clearly a cyst Normal passive flexion but mildly decreased active flexion Fairly normal active extension  Mild tenderness  Lymphadenopathy:    He has no cervical adenopathy.          Assessment & Plan:

## 2013-04-05 ENCOUNTER — Telehealth: Payer: Self-pay

## 2013-04-05 MED ORDER — AMOXICILLIN-POT CLAVULANATE 875-125 MG PO TABS: 1.0000 | ORAL_TABLET | Freq: Two times a day (BID) | ORAL | Status: DC

## 2013-04-05 NOTE — Telephone Encounter (Signed)
Pt left v/m; was seen on 04/02/13; pt has been using ear drops and lt ear pain is no better. Pt request oral antibiotic to [facility] that Dr Alphonsus Sias mentioned at visit if pt did not improve.Please advise.

## 2013-04-05 NOTE — Telephone Encounter (Signed)
Antibiotic sent Will refer to ENT if pain persists

## 2013-05-15 ENCOUNTER — Ambulatory Visit: Payer: Self-pay

## 2013-05-15 ENCOUNTER — Other Ambulatory Visit: Payer: Self-pay | Admitting: Occupational Medicine

## 2013-05-15 DIAGNOSIS — M25532 Pain in left wrist: Secondary | ICD-10-CM

## 2013-07-12 HISTORY — PX: COLONOSCOPY: SHX174

## 2013-07-12 HISTORY — PX: KNEE SURGERY: SHX244

## 2013-08-30 ENCOUNTER — Other Ambulatory Visit: Payer: Self-pay | Admitting: Family Medicine

## 2013-10-23 ENCOUNTER — Other Ambulatory Visit: Payer: Self-pay | Admitting: *Deleted

## 2013-10-23 MED ORDER — OMEPRAZOLE 40 MG PO CPDR: DELAYED_RELEASE_CAPSULE | ORAL | Status: DC

## 2014-01-17 ENCOUNTER — Ambulatory Visit (INDEPENDENT_AMBULATORY_CARE_PROVIDER_SITE_OTHER): Payer: Commercial Managed Care - PPO | Admitting: Family Medicine

## 2014-01-17 ENCOUNTER — Encounter: Payer: Self-pay | Admitting: Family Medicine

## 2014-01-17 ENCOUNTER — Other Ambulatory Visit: Payer: Self-pay | Admitting: Family Medicine

## 2014-01-17 VITALS — BP 130/84 | HR 81 | Temp 98.3°F | Ht 73.0 in | Wt 244.5 lb

## 2014-01-17 DIAGNOSIS — R1032 Left lower quadrant pain: Secondary | ICD-10-CM

## 2014-01-17 LAB — CBC WITH DIFFERENTIAL/PLATELET
Basophils Absolute: 0 10*3/uL (ref 0.0–0.1)
Basophils Relative: 0 % (ref 0–1)
Eosinophils Absolute: 0.2 10*3/uL (ref 0.0–0.7)
Eosinophils Relative: 4 % (ref 0–5)
HCT: 45.3 % (ref 39.0–52.0)
Hemoglobin: 15.9 g/dL (ref 13.0–17.0)
Lymphocytes Relative: 55 % — ABNORMAL HIGH (ref 12–46)
Lymphs Abs: 2.1 10*3/uL (ref 0.7–4.0)
MCH: 29.5 pg (ref 26.0–34.0)
MCHC: 35.1 g/dL (ref 30.0–36.0)
MCV: 84 fL (ref 78.0–100.0)
Monocytes Absolute: 0.4 10*3/uL (ref 0.1–1.0)
Monocytes Relative: 10 % (ref 3–12)
Neutro Abs: 1.2 10*3/uL — ABNORMAL LOW (ref 1.7–7.7)
Neutrophils Relative %: 31 % — ABNORMAL LOW (ref 43–77)
Platelets: 184 10*3/uL (ref 150–400)
RBC: 5.39 MIL/uL (ref 4.22–5.81)
RDW: 13.7 % (ref 11.5–15.5)
WBC: 3.9 10*3/uL — ABNORMAL LOW (ref 4.0–10.5)

## 2014-01-17 LAB — BASIC METABOLIC PANEL
BUN: 17 mg/dL (ref 6–23)
CO2: 28 mEq/L (ref 19–32)
Calcium: 9.6 mg/dL (ref 8.4–10.5)
Chloride: 102 mEq/L (ref 96–112)
Creat: 1.28 mg/dL (ref 0.50–1.35)
Glucose, Bld: 114 mg/dL — ABNORMAL HIGH (ref 70–99)
Potassium: 4 mEq/L (ref 3.5–5.3)
Sodium: 137 mEq/L (ref 135–145)

## 2014-01-17 LAB — HEPATIC FUNCTION PANEL
ALT: 42 U/L (ref 0–53)
AST: 43 U/L — ABNORMAL HIGH (ref 0–37)
Albumin: 4.5 g/dL (ref 3.5–5.2)
Alkaline Phosphatase: 83 U/L (ref 39–117)
Bilirubin, Direct: 0.1 mg/dL (ref 0.0–0.3)
Indirect Bilirubin: 0.4 mg/dL (ref 0.2–1.2)
Total Bilirubin: 0.5 mg/dL (ref 0.2–1.2)
Total Protein: 7.4 g/dL (ref 6.0–8.3)

## 2014-01-17 LAB — LIPASE: Lipase: 103 U/L — ABNORMAL HIGH (ref 0–75)

## 2014-01-17 MED ORDER — CIPROFLOXACIN HCL 750 MG PO TABS: 750.0000 mg | ORAL_TABLET | Freq: Two times a day (BID) | ORAL | Status: DC

## 2014-01-17 MED ORDER — METRONIDAZOLE 500 MG PO TABS: 500.0000 mg | ORAL_TABLET | Freq: Three times a day (TID) | ORAL | Status: DC

## 2014-01-17 NOTE — Progress Notes (Signed)
[place] [address] Phone: [phone] Fax: [phone]  Patient ID: Clarence Alvarez MRN: [id number], DOB: 20-Dec-1965, 48 y.o. Date of Encounter: 01/17/2014  Primary Physician:  Owens Loffler, MD   Chief Complaint: Abdominal Pain and Nausea   Subjective:   History of Present Illness:  Clarence Alvarez is a 48 y.o. very pleasant male patient who presents with the following:  For the past three or four days, has been waking up with pain in his abdomen. Feeling gassy all the time. Feeling regular bowel movement and constant pressure in his abdomen and cannot be comfortable.  Decreased eating and drinking. After drinking - lower abdomen. This is all mostly in the left.  No abd surgeries.  No diarrhea or constipation. Some increased freuquency.  Went to [place]. ? Unclear symptoms.   Decreased PO intake  llq greatest point of pain   Past Medical History, Surgical History, Social History, Family History, Problem List, Medications, and Allergies have been reviewed and updated if relevant.  Review of Systems: ROS: GEN: Acute illness details above GI: Tolerating PO intake GU: maintaining adequate hydration and urination - decreased a little Pulm: No SOB Interactive and getting along well at home.  Otherwise, ROS is as per the HPI.   Objective:   Physical Examination: BP 130/84  Pulse 81  Temp(Src) 98.3 F (36.8 C) (Oral)  Ht $R'6\' 1"'wR$  (1.854 m)  Wt 244 lb 8 oz (110.904 kg)  BMI 32.26 kg/m2  GEN: WDWN, NAD, Non-toxic, A & O x 3 HEENT: Atraumatic, Normocephalic. Neck supple. No masses, No LAD. Ears and Nose: No external deformity. CV: RRR, No M/G/R. No JVD. No thrill. No extra heart sounds. PULM: CTA B, no wheezes, crackles, rhonchi. No retractions. No resp. distress. No accessory muscle use. ABD: S, moderate tenderness LLQ and RLQ, ND, +BS. No rebound. No HSM. EXTR: No c/c/e NEURO Normal gait.  PSYCH: Normally interactive. Conversant. Not  depressed or anxious appearing.  Calm demeanor.     Laboratory and Imaging Data: Results for orders placed in visit on 89/21/19  BASIC METABOLIC PANEL      Result Value Ref Range   Sodium 137  135 - 145 mEq/L   Potassium 4.0  3.5 - 5.3 mEq/L   Chloride 102  96 - 112 mEq/L   CO2 28  19 - 32 mEq/L   Glucose, Bld 114 (*) 70 - 99 mg/dL   BUN 17  6 - 23 mg/dL   Creat 1.28  0.50 - 1.35 mg/dL   Calcium 9.6  8.4 - 10.5 mg/dL  CBC WITH DIFFERENTIAL      Result Value Ref Range   WBC 3.9 (*) 4.0 - 10.5 K/uL   RBC 5.39  4.22 - 5.81 MIL/uL   Hemoglobin 15.9  13.0 - 17.0 g/dL   HCT 45.3  39.0 - 52.0 %   MCV 84.0  78.0 - 100.0 fL   MCH 29.5  26.0 - 34.0 pg   MCHC 35.1  30.0 - 36.0 g/dL   RDW 13.7  11.5 - 15.5 %   Platelets 184  150 - 400 K/uL   Neutrophils Relative % 31 (*) 43 - 77 %   Neutro Abs 1.2 (*) 1.7 - 7.7 K/uL   Lymphocytes Relative 55 (*) 12 - 46 %   Lymphs Abs 2.1  0.7 - 4.0 K/uL   Monocytes Relative 10  3 - 12 %   Monocytes Absolute 0.4  0.1 - 1.0 K/uL  Eosinophils Relative 4  0 - 5 %   Eosinophils Absolute 0.2  0.0 - 0.7 K/uL   Basophils Relative 0  0 - 1 %   Basophils Absolute 0.0  0.0 - 0.1 K/uL   Smear Review Criteria for review not met    HEPATIC FUNCTION PANEL      Result Value Ref Range   Total Bilirubin 0.5  0.2 - 1.2 mg/dL   Bilirubin, Direct 0.1  0.0 - 0.3 mg/dL   Indirect Bilirubin 0.4  0.2 - 1.2 mg/dL   Alkaline Phosphatase 83  39 - 117 U/L   AST 43 (*) 0 - 37 U/L   ALT 42  0 - 53 U/L   Total Protein 7.4  6.0 - 8.3 g/dL   Albumin 4.5  3.5 - 5.2 g/dL  LIPASE      Result Value Ref Range   Lipase 103 (*) 0 - 75 U/L     Assessment & Plan:   Abdominal pain, left lower quadrant - Plan: Basic metabolic panel, CBC with Differential, Hepatic function panel, Lipase, CT Abdomen Pelvis W Contrast  The patient has significant abdominal pain on exam. Stoic gentleman normally. Obtain a CT of the abdomen with contrast to evaluate for diverticulitis, cannot exclude  appendicitis, bowel perforation, small bowel obstruction, or other acute intra-abdominal process.  For now, treated presumptively for diverticulitis and obtain laboratories.  Lipase is mildly elevated, so we will also add an amylase.  New Prescriptions   CIPROFLOXACIN (CIPRO) 750 MG TABLET    Take 1 tablet (750 mg total) by mouth 2 (two) times daily.   METRONIDAZOLE (FLAGYL) 500 MG TABLET    Take 1 tablet (500 mg total) by mouth 3 (three) times daily.   Modified Medications   No medications on file   Orders Placed This Encounter  Procedures  . CT Abdomen Pelvis W Contrast  . Basic metabolic panel  . CBC with Differential  . Hepatic function panel  . Lipase   Follow-up: No Follow-up on file. Unless noted above, the patient is to follow-up if symptoms worsen. Red flags were reviewed with the patient.  Signed,  Maud Deed. Leslieann Whisman, MD, [facility]   Discontinued Medications   AMOXICILLIN-CLAVULANATE (AUGMENTIN) 875-125 MG PER TABLET    Take 1 tablet by mouth 2 (two) times daily.   CIPROFLOXACIN-HYDROCORTISONE (CIPRO HC OTIC) OTIC SUSPENSION    Place 3 drops into the left ear 2 (two) times daily.   Current Medications at Discharge:   Medication List       This list is accurate as of: 01/17/14 11:59 PM.  Always use your most recent med list.               cetirizine 10 MG tablet  Commonly known as:  ZYRTEC  Take 10 mg by mouth daily.     ciprofloxacin 750 MG tablet  Commonly known as:  CIPRO  Take 1 tablet (750 mg total) by mouth 2 (two) times daily.     fluticasone 44 MCG/ACT inhaler  Commonly known as:  FLOVENT HFA  Inhale 2 puffs into the lungs 2 (two) times daily.     fluticasone 50 MCG/ACT nasal spray  Commonly known as:  FLONASE  USE 2 SPRAYS IN EACH NOSTRIL ONCE A DAY     MEGA MULTIVITAMIN FOR MEN Tabs  Take by mouth daily.     metroNIDAZOLE 500 MG tablet  Commonly known as:  FLAGYL  Take 1 tablet (500 mg total) by mouth  3 (three) times daily.       omeprazole 40 MG capsule  Commonly known as:  PRILOSEC  TAKE 1 CAPSULE BY MOUTH ONCE A DAY

## 2014-01-17 NOTE — Patient Instructions (Signed)
CT ABD tomorrow: we will call

## 2014-01-17 NOTE — Progress Notes (Signed)
Pre visit review using our clinic review tool, if applicable. No additional management support is needed unless otherwise documented below in the visit note. 

## 2014-01-18 ENCOUNTER — Telehealth: Payer: Self-pay | Admitting: *Deleted

## 2014-01-18 ENCOUNTER — Ambulatory Visit (INDEPENDENT_AMBULATORY_CARE_PROVIDER_SITE_OTHER)
Admission: RE | Admit: 2014-01-18 | Discharge: 2014-01-18 | Disposition: A | Discharge status: Home / Self Care | Payer: Commercial Managed Care - PPO | Source: Ambulatory Visit | Attending: Family Medicine | Admitting: Family Medicine

## 2014-01-18 DIAGNOSIS — R1032 Left lower quadrant pain: Secondary | ICD-10-CM

## 2014-01-18 LAB — AMYLASE: Amylase: 27 U/L (ref 0–105)

## 2014-01-18 MED ORDER — IOHEXOL 300 MG/ML  SOLN
100.0000 mL | Freq: Once | INTRAMUSCULAR | Status: AC | PRN
  Administered 2014-01-18: 100 mL via INTRAVENOUS

## 2014-01-18 NOTE — Telephone Encounter (Signed)
Clarence Alvarez notified that labs basically all look ok. One liver enzyme mildly elevated, but this is where it has been for a while.  Pancreatic enzyme is mildly elevated - at this level, though, doubt that is causing his symptoms.  Will await CT results.

## 2014-01-24 ENCOUNTER — Encounter: Payer: Self-pay | Admitting: Family Medicine

## 2014-01-24 ENCOUNTER — Encounter: Payer: Self-pay | Admitting: Gastroenterology

## 2014-01-24 ENCOUNTER — Other Ambulatory Visit: Payer: Self-pay | Admitting: Family Medicine

## 2014-01-24 DIAGNOSIS — Z1211 Encounter for screening for malignant neoplasm of colon: Secondary | ICD-10-CM

## 2014-01-24 DIAGNOSIS — R109 Unspecified abdominal pain: Secondary | ICD-10-CM

## 2014-01-24 MED ORDER — AMOXICILLIN-POT CLAVULANATE 875-125 MG PO TABS: 1.0000 | ORAL_TABLET | Freq: Two times a day (BID) | ORAL | Status: DC

## 2014-01-24 NOTE — Progress Notes (Signed)
D/w patients/ Abd pain improving. Various side effects ? Cipro. Exact source unclear.   D/c cipro and falgyl.  Start augmentin.  Colonoscopy referral.  Also reviewed CT abd / pelvis with him again.  Ct Abdomen Pelvis W Contrast  01/18/2014   CLINICAL DATA:  Diverticulitis. LEFT lower quadrant pain. Abdominal pain and nausea for 1 week.  EXAM: CT ABDOMEN AND PELVIS WITH CONTRAST  TECHNIQUE: Multidetector CT imaging of the abdomen and pelvis was performed using the standard protocol following bolus administration of intravenous contrast.  CONTRAST:  162mL OMNIPAQUE IOHEXOL 300 MG/ML  SOLN  COMPARISON:  None.  FINDINGS: Bones: No aggressive osseous lesions. Left-greater-than-right hip osteoarthritis.  Lung Bases: Dependent atelectasis.  Liver:  Normal.  Spleen:  Normal.  Gallbladder:  Normal.  Common bile duct:  Normal.  Pancreas:  Normal.  Adrenal glands:  Normal bilaterally.  Kidneys: Normal enhancement. Delayed excretion of contrast is normal. Both ureters are within normal limits. 5 mm nonobstructing LEFT inferior pole renal collecting system calculus (image 96 series 602)  Stomach:  Collapsed.  No inflammatory changes.  Small bowel: Duodenum appears normal. No small bowel obstruction, mesenteric adenopathy or small bowel inflammatory changes.  Colon: Normal appendix. Remainder of the colon is normal. No colonic diverticulitis or diverticulosis.  Pelvic Genitourinary: Normal urinary bladder. Prostate gland appears normal. No free fluid. No adenopathy. Phleboliths in the pelvis.  Vasculature: Mild atherosclerosis.  Body Wall: Tiny fat containing periumbilical hernia.  IMPRESSION: Negative for diverticulitis or acute abnormality. Nonobstructing 5 mm LEFT inferior pole renal collecting system calculus. No ureteral calculi.   Electronically Signed   By: Dereck Ligas M.D.   On: 01/18/2014 15:37    Electronically Signed  By: Owens Loffler, MD On: 01/24/2014 10:14 AM

## 2014-03-21 ENCOUNTER — Ambulatory Visit (AMBULATORY_SURGERY_CENTER): Payer: Self-pay | Admitting: *Deleted

## 2014-03-21 VITALS — Ht 73.0 in | Wt 246.8 lb

## 2014-03-21 DIAGNOSIS — Z1211 Encounter for screening for malignant neoplasm of colon: Secondary | ICD-10-CM

## 2014-03-21 MED ORDER — MOVIPREP 100 G PO SOLR: 1.0000 | Freq: Once | ORAL | Status: DC

## 2014-03-21 NOTE — Progress Notes (Signed)
No home 02 use. ewm No cpap use. ewm No egg or soy allergy. ewm No previous colon. ewm No problems with past sedation. emw Pt saw PCP in 01-17-14 with LLQ abd pain but pain has subsided since this OV. emw

## 2014-03-25 ENCOUNTER — Ambulatory Visit (AMBULATORY_SURGERY_CENTER): Payer: Commercial Managed Care - PPO | Admitting: Gastroenterology

## 2014-03-25 ENCOUNTER — Encounter: Payer: Self-pay | Admitting: Gastroenterology

## 2014-03-25 VITALS — BP 116/78 | HR 54 | Temp 95.8°F | Resp 10 | Wt 246.0 lb

## 2014-03-25 DIAGNOSIS — D122 Benign neoplasm of ascending colon: Secondary | ICD-10-CM

## 2014-03-25 DIAGNOSIS — D123 Benign neoplasm of transverse colon: Secondary | ICD-10-CM

## 2014-03-25 DIAGNOSIS — D126 Benign neoplasm of colon, unspecified: Secondary | ICD-10-CM

## 2014-03-25 DIAGNOSIS — Z1211 Encounter for screening for malignant neoplasm of colon: Secondary | ICD-10-CM

## 2014-03-25 DIAGNOSIS — D120 Benign neoplasm of cecum: Secondary | ICD-10-CM

## 2014-03-25 HISTORY — PX: COLONOSCOPY: SHX174

## 2014-03-25 MED ORDER — SODIUM CHLORIDE 0.9 % IV SOLN: 500.0000 mL | INTRAVENOUS | Status: DC

## 2014-03-25 NOTE — Op Note (Signed)
[facility]  [address] [place], [id number]   COLONOSCOPY PROCEDURE REPORT  PATIENT: Clarence Alvarez, Clarence Alvarez  MR#: [id number] BIRTHDATE: May 13, 1966 , 47  yrs. old GENDER: Male ENDOSCOPIST: Ladene Artist, MD, [facility] REFERRED XQ:JJHERDE Celedonio Savage, M.D. PROCEDURE DATE:  03/25/2014 PROCEDURE:   Colonoscopy with biopsy and snare polypectomy First Screening Colonoscopy - Avg.  risk and is 50 yrs.  old or older Yes.  Prior Negative Screening - Now for repeat screening. N/A  History of Adenoma - Now for follow-up colonoscopy & has been > or = to 3 yrs.  N/A  Polyps Removed Today? Yes. ASA CLASS:   Class II INDICATIONS:average risk screening. MEDICATIONS: MAC sedation, administered by CRNA and propofol (Diprivan) 300mg  IV DESCRIPTION OF PROCEDURE:   After the risks benefits and alternatives of the procedure were thoroughly explained, informed consent was obtained.  A digital rectal exam revealed no abnormalities of the rectum.   The LB [id number]  endoscope was introduced through the anus and advanced to the cecum, which was identified by both the appendix and ileocecal valve. No adverse events experienced.   The quality of the prep was good, using MoviPrep  The instrument was then slowly withdrawn as the colon was fully examined.  COLON FINDINGS: Two sessile polyps measuring 5 mm in size were found at the cecum and in the ascending colon.  A polypectomy was performed with cold forceps.  The resection was complete and the polyp tissue was completely retrieved.   A semi-pedunculated polyp measuring 7 mm in size was found in the transverse colon.  A polypectomy was performed with a cold snare.  The resection was complete and the polyp tissue was completely retrieved.   The colon was otherwise normal.  There was no diverticulosis, inflammation, polyps or cancers unless previously stated.  Retroflexed views revealed no abnormalities. The time to cecum=2 minutes 38  seconds. Withdrawal time=12 minutes 17 seconds.  The scope was withdrawn and the procedure completed. COMPLICATIONS: There were no complications.  ENDOSCOPIC IMPRESSION: 1.   Two sessile polyps measuring 5 mm at the cecum and in the ascending colon; polypectomy performed with cold forceps 2.   Semi-pedunculated polyp measuring 7 mm in the transverse colon; polypectomy performed with a cold snare 3.   The colon was otherwise normal  RECOMMENDATIONS: 1.  Await pathology results 2.  Repeat colonoscopy in 5 years if polyp(s) adenomatous; otherwise 10 years  eSigned:  Ladene Artist, MD, [facility] 03/25/2014 9:42 AM

## 2014-03-25 NOTE — Patient Instructions (Signed)
Discharge instructions given with verbal understanding. Handout on polyps. Resume previous medications. YOU HAD AN ENDOSCOPIC PROCEDURE TODAY AT THE [facility]: Refer to the procedure report that was given to you for any specific questions about what was found during the examination.  If the procedure report does not answer your questions, please call your gastroenterologist to clarify.  If you requested that your care partner not be given the details of your procedure findings, then the procedure report has been included in a sealed envelope for you to review at your convenience later.  YOU SHOULD EXPECT: Some feelings of bloating in the abdomen. Passage of more gas than usual.  Walking can help get rid of the air that was put into your GI tract during the procedure and reduce the bloating. If you had a lower endoscopy (such as a colonoscopy or flexible sigmoidoscopy) you may notice spotting of blood in your stool or on the toilet paper. If you underwent a bowel prep for your procedure, then you may not have a normal bowel movement for a few days.  DIET: Your first meal following the procedure should be a light meal and then it is ok to progress to your normal diet.  A half-sandwich or bowl of soup is an example of a good first meal.  Heavy or fried foods are harder to digest and may make you feel nauseous or bloated.  Likewise meals heavy in dairy and vegetables can cause extra gas to form and this can also increase the bloating.  Drink plenty of fluids but you should avoid alcoholic beverages for 24 hours.  ACTIVITY: Your care partner should take you home directly after the procedure.  You should plan to take it easy, moving slowly for the rest of the day.  You can resume normal activity the day after the procedure however you should NOT DRIVE or use heavy machinery for 24 hours (because of the sedation medicines used during the test).    SYMPTOMS TO REPORT IMMEDIATELY: A  gastroenterologist can be reached at any hour.  During normal business hours, 8:30 AM to 5:00 PM Monday through Friday, call [phone].  After hours and on weekends, please call the GI answering service at [phone] who will take a message and have the physician on call contact you.   Following lower endoscopy (colonoscopy or flexible sigmoidoscopy):  Excessive amounts of blood in the stool  Significant tenderness or worsening of abdominal pains  Swelling of the abdomen that is new, acute  Fever of 100F or higher  FOLLOW UP: If any biopsies were taken you will be contacted by phone or by letter within the next 1-3 weeks.  Call your gastroenterologist if you have not heard about the biopsies in 3 weeks.  Our staff will call the home number listed on your records the next business day following your procedure to check on you and address any questions or concerns that you may have at that time regarding the information given to you following your procedure. This is a courtesy call and so if there is no answer at the home number and we have not heard from you through the emergency physician on call, we will assume that you have returned to your regular daily activities without incident.  SIGNATURES/CONFIDENTIALITY: You and/or your care partner have signed paperwork which will be entered into your electronic medical record.  These signatures attest to the fact that that the information above on your After Visit Summary has been   reviewed and is understood.  Full responsibility of the confidentiality of this discharge information lies with you and/or your care-partner. 

## 2014-03-25 NOTE — Progress Notes (Signed)
Procedure ends, to recovery, report given and VSS. 

## 2014-03-25 NOTE — Progress Notes (Signed)
Called to room to assist during endoscopic procedure.  Patient ID and intended procedure confirmed with present staff. Received instructions for my participation in the procedure from the performing physician.  

## 2014-03-26 ENCOUNTER — Telehealth: Payer: Self-pay | Admitting: *Deleted

## 2014-03-26 NOTE — Telephone Encounter (Signed)
  Follow up Call-  Call back number 03/25/2014  Post procedure Call Back phone  # [phone] cell  Permission to leave phone message Yes     Patient questions:  Do you have a fever, pain , or abdominal swelling? No. Pain Score  0 *  Have you tolerated food without any problems? Yes.    Have you been able to return to your normal activities? Yes.    Do you have any questions about your discharge instructions: Diet   No. Medications  Yes.   Follow up visit  No.  Do you have questions or concerns about your Care? No.  Actions: * If pain score is 4 or above: No action needed, pain <4.

## 2014-03-27 ENCOUNTER — Ambulatory Visit (INDEPENDENT_AMBULATORY_CARE_PROVIDER_SITE_OTHER): Payer: Commercial Managed Care - PPO | Admitting: Family Medicine

## 2014-03-27 ENCOUNTER — Telehealth: Payer: Self-pay | Admitting: Family Medicine

## 2014-03-27 ENCOUNTER — Encounter: Payer: Self-pay | Admitting: Family Medicine

## 2014-03-27 VITALS — BP 118/86 | HR 59 | Temp 98.1°F | Ht 73.0 in | Wt 248.2 lb

## 2014-03-27 DIAGNOSIS — B9789 Other viral agents as the cause of diseases classified elsewhere: Secondary | ICD-10-CM

## 2014-03-27 DIAGNOSIS — J45901 Unspecified asthma with (acute) exacerbation: Secondary | ICD-10-CM | POA: Insufficient documentation

## 2014-03-27 DIAGNOSIS — J4521 Mild intermittent asthma with (acute) exacerbation: Secondary | ICD-10-CM

## 2014-03-27 DIAGNOSIS — J069 Acute upper respiratory infection, unspecified: Secondary | ICD-10-CM

## 2014-03-27 DIAGNOSIS — J4541 Moderate persistent asthma with (acute) exacerbation: Secondary | ICD-10-CM | POA: Insufficient documentation

## 2014-03-27 MED ORDER — PREDNISONE 10 MG PO TABS: ORAL_TABLET | ORAL | Status: DC

## 2014-03-27 NOTE — Assessment & Plan Note (Signed)
With viral uri - wheeze and chest tightness  Will tx with a course of prednisone (30 mg taper)  Continue current asthma med and albuterol prn  Update if not starting to improve in a week or if worsening

## 2014-03-27 NOTE — Telephone Encounter (Signed)
Clarence Alvarez is in the office now seeing Dr. Glori Bickers.

## 2014-03-27 NOTE — Progress Notes (Signed)
Pre visit review using our clinic review tool, if applicable. No additional management support is needed unless otherwise documented below in the visit note. 

## 2014-03-27 NOTE — Assessment & Plan Note (Signed)
Disc symptomatic care - see instructions on AVS  Asthma is flaring-tx with pred taper  Mucinex/fluids/ continue allergy meds Update if not starting to improve in a week or if worsening

## 2014-03-27 NOTE — Progress Notes (Signed)
Subjective:    Patient ID: Clarence Alvarez, male    DOB: [birth year], 48 y.o.   MRN: [id number]  HPI Here with uri symptoms  Sinus congestion / L ear hurts / tight breathing/ sneezing and coughing  He does have allergy symptoms this time of the year  All mucous is clear  Had the chills one day   He tried mucinex day and night formula   Chest is tight -his albuterol is not helping much  With nasal congestion - has to breathe out of his mouth  Still using flovent   Takes flonase and zyrtec for allergies   Had a colonoscopy Monday and has not felt great since   Patient Active Problem List   Diagnosis Date Noted  . OSA (obstructive sleep apnea) 01/26/2013  . ALLERGIC RHINITIS, SEASONAL 09/15/2009  . ASTHMA, PERSISTENT 09/08/2006   Past Medical History  Diagnosis Date  . Asthma exacerbation, mild   . Allergic rhinitis due to pollen   . Plantar fasciitis     left foot   Past Surgical History  Procedure Laterality Date  . Shoulder arthroscopy w/ rotator cuff repair  2012    Dr. Noemi Chapel, RTC repair, labral repair, subacromial decompression  . Vasectomy     History  Substance Use Topics  . Smoking status: Former Smoker -- 3 years    Quit date: 07/12/1984  . Smokeless tobacco: Current User    Types: Snuff     Comment: "EXPERIMENTED" W/SMOKING--NOT EVEN A PACK X 1 WEEK.   . Alcohol Use: Yes     Comment: 1-2 drinks per week   Family History  Problem Relation Age of Onset  . Diabetes Paternal Grandmother   . Diabetes Maternal Grandmother   . Colon cancer Maternal Grandmother 87  . Mental illness Brother   . Mental illness Sister   . Mental illness Daughter   . Stomach cancer Maternal Grandfather   . COPD Other     Aunt  . Diabetes Other     siblings  . Diabetes Mother   . Hepatitis C Mother   . Prostate cancer Father   . Pancreatic cancer Paternal Aunt   . Esophageal cancer Neg Hx   . Rectal cancer Neg Hx    No Known Allergies Current Outpatient  Prescriptions on File Prior to Visit  Medication Sig Dispense Refill  . cetirizine (ZYRTEC) 10 MG tablet Take 10 mg by mouth daily.      . fluticasone (FLONASE) 50 MCG/ACT nasal spray USE 2 SPRAYS IN EACH NOSTRIL ONCE A DAY  16 g  5  . fluticasone (FLOVENT HFA) 44 MCG/ACT inhaler Inhale 2 puffs into the lungs 2 (two) times daily.  1 Inhaler  2  . Multiple Vitamins-Minerals (MEGA MULTIVITAMIN FOR MEN) TABS Take by mouth daily.      Marland Kitchen omeprazole (PRILOSEC) 40 MG capsule TAKE 1 CAPSULE BY MOUTH ONCE A DAY  90 capsule  1  . [DISCONTINUED] albuterol (PROVENTIL,VENTOLIN) 90 MCG/ACT inhaler Inhale 2 puffs into the lungs every 6 (six) hours as needed for wheezing.  17 g  2   No current facility-administered medications on file prior to visit.      Review of Systems Review of Systems  Constitutional: Negative for fever, appetite change,  and unexpected weight change. pos for fatigue ENT pos for congestion and ear fullness and drip /neg for sinus pain  Eyes: Negative for pain and visual disturbance.  Respiratory: Negative for shortness of breath.pos for tight cough with  some wheezing and tight feeling in lungs    Cardiovascular: Negative for cp or palpitations    Gastrointestinal: Negative for nausea, diarrhea and constipation.  Genitourinary: Negative for urgency and frequency.  Skin: Negative for pallor or rash   Neurological: Negative for weakness, light-headedness, numbness and headaches.  Hematological: Negative for adenopathy. Does not bruise/bleed easily.  Psychiatric/Behavioral: Negative for dysphoric mood. The patient is not nervous/anxious.         Objective:   Physical Exam  Constitutional: He appears well-developed and well-nourished. No distress.  overwt and well app  HENT:  Head: Normocephalic and atraumatic.  Right Ear: External ear normal.  Left Ear: External ear normal.  Mouth/Throat: Oropharynx is clear and moist.  Nares are injected and congested   No sinus pain    Throat clear Clear rhinorrhea and post nasal drip   Eyes: Conjunctivae and EOM are normal. Pupils are equal, round, and reactive to light. Right eye exhibits no discharge. Left eye exhibits no discharge.  Neck: Normal range of motion. Neck supple. No JVD present. No thyromegaly present.  Cardiovascular: Normal rate, regular rhythm and normal heart sounds.   Pulmonary/Chest: Effort normal. No respiratory distress. He has wheezes. He has no rales. He exhibits no tenderness.  Harsh bs  No rales or rhonchi  Wheeze on forced expiration with slightly prolonged exp phase   Lymphadenopathy:    He has no cervical adenopathy.  Neurological: He is alert.  Skin: Skin is warm and dry. No rash noted. He is not diaphoretic.  Psychiatric: He has a normal mood and affect.          Assessment & Plan:   Problem List Items Addressed This Visit     Respiratory   Viral URI with cough - Primary     Disc symptomatic care - see instructions on AVS  Asthma is flaring-tx with pred taper  Mucinex/fluids/ continue allergy meds Update if not starting to improve in a week or if worsening      Asthma with acute exacerbation     With viral uri - wheeze and chest tightness  Will tx with a course of prednisone (30 mg taper)  Continue current asthma med and albuterol prn  Update if not starting to improve in a week or if worsening       Relevant Medications      albuterol (PROVENTIL HFA;VENTOLIN HFA) 108 (90 BASE) MCG/ACT inhaler      predniSONE (DELTASONE) tablet

## 2014-03-27 NOTE — Telephone Encounter (Signed)
Patient Information:  Caller Name: Cooper  Phone: [phone]  Patient: Clarence Alvarez, Clarence Alvarez  Gender: Male  DOB: 1965-09-25  Age: 48 Years  PCP: Owens Loffler [facility])  Office Follow Up:  Does the office need to follow up with this patient?: No  Instructions For The Office: N/A  RN Note:  Patient calling regarding increase need of albuterol inhaler.  Denies asthma attack at this time. Patient is currently at work and states he has rescue inhaler with him.  States "Over the past few days he has had to use inhaler more often and is unable to do any strenuous work."  Symptoms  Reason For Call & Symptoms: wheezing  Reviewed Health History In EMR: Yes  Reviewed Medications In EMR: Yes  Reviewed Allergies In EMR: Yes  Reviewed Surgeries / Procedures: Yes  Date of Onset of Symptoms: 03/25/2014  Guideline(s) Used:  Breathing Difficulty  Disposition Per Guideline:   Go to ED Now  Reason For Disposition Reached:   Moderate difficulty breathing (e.g., speaks in phrases, SOB even at rest, pulse 100-120) of new onset or worse than normal  Advice Given:  Call Back If:  Severe difficulty breathing occurs  You become worse.  Patient Will Follow Care Advice:  YES

## 2014-03-27 NOTE — Patient Instructions (Addendum)
I think you have a viral upper respiratory infection with asthma exacerbation  Drink lots of fluids and rest  mucinex is fine for congestion  Continue allergy and asthma medicines  Take prednisone as directed  Update if not starting to improve in a week or if worsening

## 2014-03-27 NOTE — Telephone Encounter (Signed)
Can you call him. He probably could go to the office or urgent care to be checked.

## 2014-03-31 ENCOUNTER — Encounter: Payer: Self-pay | Admitting: Gastroenterology

## 2014-04-01 ENCOUNTER — Telehealth: Payer: Self-pay | Admitting: *Deleted

## 2014-04-01 MED ORDER — PREDNISONE 20 MG PO TABS: ORAL_TABLET | ORAL | Status: DC

## 2014-04-01 NOTE — Telephone Encounter (Signed)
Mr. Fawbush notified prescription has been sent to his pharmacy.  To call if continues to have problems after completing this prednisone taper. Patient states understanding.

## 2014-04-01 NOTE — Telephone Encounter (Signed)
Called and followed up with Clarence Alvarez in regards to his Call A Nurse note from yesterday.  Mr. Retz states he went home from church and used his albuterol inhaler.  He did not go to the ED but just stayed home a rested.  He is using his albuterol inhaler about two times a day.  Still complains of tightness in his chest.  Advised he might just another course of prednisone but will forward note to Dr. [name] with his update.

## 2014-04-01 NOTE — Telephone Encounter (Signed)
i am going to give him a little stronger and longer pred taper.  predinsone 20 mg, 2 tabs po for 5 days, then 1 tab po for 5 days, #15, 0 ref.

## 2014-04-04 ENCOUNTER — Encounter: Payer: Self-pay | Admitting: Gastroenterology

## 2014-05-26 ENCOUNTER — Other Ambulatory Visit: Payer: Self-pay | Admitting: Family Medicine

## 2014-07-13 ENCOUNTER — Other Ambulatory Visit: Payer: Self-pay | Admitting: Family Medicine

## 2014-09-09 ENCOUNTER — Other Ambulatory Visit: Payer: Self-pay | Admitting: Family Medicine

## 2014-09-11 ENCOUNTER — Encounter: Payer: Self-pay | Admitting: Family Medicine

## 2014-09-11 ENCOUNTER — Ambulatory Visit (INDEPENDENT_AMBULATORY_CARE_PROVIDER_SITE_OTHER): Payer: Commercial Managed Care - PPO | Admitting: Family Medicine

## 2014-09-11 ENCOUNTER — Ambulatory Visit (INDEPENDENT_AMBULATORY_CARE_PROVIDER_SITE_OTHER)
Admission: RE | Admit: 2014-09-11 | Discharge: 2014-09-11 | Disposition: A | Discharge status: Home / Self Care | Payer: Commercial Managed Care - PPO | Source: Ambulatory Visit | Attending: Family Medicine | Admitting: Family Medicine

## 2014-09-11 VITALS — BP 120/70 | HR 75 | Temp 98.3°F | Ht 73.0 in | Wt 254.0 lb

## 2014-09-11 DIAGNOSIS — M25561 Pain in right knee: Secondary | ICD-10-CM | POA: Diagnosis not present

## 2014-09-11 DIAGNOSIS — M2391 Unspecified internal derangement of right knee: Secondary | ICD-10-CM | POA: Diagnosis not present

## 2014-09-11 NOTE — Progress Notes (Signed)
Pre visit review using our clinic review tool, if applicable. No additional management support is needed unless otherwise documented below in the visit note. 

## 2014-09-11 NOTE — Progress Notes (Signed)
Dr. Frederico Hamman T. Helyn Schwan, MD, [facility] [place], [address] Phone: [phone] Fax: [phone]  09/11/2014  Patient: Clarence Alvarez, MRN: [id number], DOB: 02-16-1966, 49 y.o.  Primary Physician:  Owens Loffler, MD  Chief Complaint: Knee Pain  Subjective:   Clarence Alvarez is a 49 y.o. very pleasant male patient who presents with the following:  R knee has been bothering him a lot. Pain medially and on the back on his leg. Has a lot of cartilage.  He has been having problems with his right knee off and on for many months. Approximately one week ago the patient was jumping down off of some bleachers and felt so significant pain in his right knee. He has been having difficulty prior to this, but acutely he had some pain, some swelling, and he has been limping since then. He also has a hinged knee brace that he has been wearing. Over the many months, he has intermittently tried some anti-inflammatories and Tylenol without much success. 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  Wainer did shoulder scope  Past Medical History, Surgical History, Social History, Family History, Problem List, Medications, and Allergies have been reviewed and updated if relevant.  GEN: No fevers, chills. Nontoxic. Primarily MSK c/o today. MSK: Detailed in the HPI GI: tolerating PO intake without difficulty Neuro: No numbness, parasthesias, or tingling associated. Otherwise the pertinent positives of the ROS are noted above.   Objective:   BP 120/70 mmHg  Pulse 75  Temp(Src) 98.3 F (36.8 C) (Oral)  Ht 6\' 1"  (1.854 m)  Wt 254 lb (115.214 kg)  BMI 33.52 kg/m2   GEN: WDWN, NAD, Non-toxic, Alert & Oriented x 3 HEENT: Atraumatic, Normocephalic.  Ears and Nose: No external deformity. EXTR: No clubbing/cyanosis/edema PSYCH: Normally interactive. Conversant. Not depressed or anxious appearing.  Calm  demeanor.     right knee: full extension with flexion to 110. There is a mild effusion. Notable tenderness at the medial and lateral joint lines. There is tenderness with patellar manipulation. Stable MCL, LCL, anterior cruciate ligament, and PCL. Flexion pinch causes significant pain. McMurray's causes significant pain without  Mechanical symptoms.  Radiology: X-rays: AP Weight-bearing, Weightbearing Lateral, Sunrise views Indication: knee pain, R Findings:   Overall, the joint spaces are relatively well preserved. There is a minimal amount of medial compartmental loss of joint space. The patellofemoral compartment has more arthritic change with associated calcification in the tendons on the inferior and superior pole of the patella. The radiological images were independently reviewed by myself in the office and results were reviewed with the patient. My independent interpretation of images:  Electronically Signed  By: Owens Loffler, MD On: 09/11/2014 5:15 PM   Assessment and Plan:   Right knee pain - Plan: DG Knee AP/LAT W/Sunrise Right, MR Knee Right Wo Contrast  Internal derangement of right knee  >25 minutes spent in face to face time with patient, >50% spent in counselling or coordination of care:  Discussion regarding anatomy and various ways of treating this pain and injury including conservative versus more aggressive care.   acute on chronic knee pain with recent injury and effusion, loss  Of motion with a positive McMurray's and positive flexion pinch test. Joint line tenderness medially and laterally. This would be most suggestive of meniscal tear. Highly active person who needs to have a functional knee for work. Minimal osteoarthritic change on  x-rays. Obtain an MRI of the right knee to evaluate for internal derangement, evaluate for meniscal tear and cruciate ligament stability.  Surgeon is Noemi Chapel  Follow-up: No Follow-up on file.  New Prescriptions   No medications on file     Orders Placed This Encounter  Procedures  . DG Knee AP/LAT W/Sunrise Right  . MR Knee Right Wo Contrast    Signed,  Doniqua Saxby T. Louine Tenpenny, MD   Patient's Medications  New Prescriptions   No medications on file  Previous Medications   ALBUTEROL (PROVENTIL HFA;VENTOLIN HFA) 108 (90 BASE) MCG/ACT INHALER    Inhale 2 puffs into the lungs every 6 (six) hours as needed for wheezing or shortness of breath.   CETIRIZINE (ZYRTEC) 10 MG TABLET    Take 10 mg by mouth daily.   FLOVENT HFA 44 MCG/ACT INHALER    USE 2 PUFFS 2 TIMES A DAY   FLUTICASONE (FLONASE) 50 MCG/ACT NASAL SPRAY    USE 2 SPRAYS IN EACH NOSTRIL ONCE A DAY   MULTIPLE VITAMINS-MINERALS (MEGA MULTIVITAMIN FOR MEN) TABS    Take by mouth daily.   OMEPRAZOLE (PRILOSEC) 40 MG CAPSULE    TAKE 1 CAPSULE BY MOUTH ONCE A DAY  Modified Medications   No medications on file  Discontinued Medications   PREDNISONE (DELTASONE) 20 MG TABLET    2 tabs by mouth for 5 days, then 1 tab by mouth for 5 days

## 2014-09-11 NOTE — Patient Instructions (Signed)

## 2014-09-16 ENCOUNTER — Ambulatory Visit
Admission: RE | Admit: 2014-09-16 | Discharge: 2014-09-16 | Disposition: A | Discharge status: Home / Self Care | Payer: Commercial Managed Care - PPO | Source: Ambulatory Visit | Attending: Family Medicine | Admitting: Family Medicine

## 2014-09-16 DIAGNOSIS — M25561 Pain in right knee: Secondary | ICD-10-CM

## 2014-09-19 ENCOUNTER — Telehealth: Payer: Self-pay | Admitting: Family Medicine

## 2014-09-19 NOTE — Telephone Encounter (Signed)
Patient had an MRI done on Monday.  Patient is asking to be called when you receive the results.

## 2014-09-20 NOTE — Telephone Encounter (Signed)
i spoke with him yesterday.

## 2014-09-25 ENCOUNTER — Ambulatory Visit (INDEPENDENT_AMBULATORY_CARE_PROVIDER_SITE_OTHER): Payer: Commercial Managed Care - PPO | Admitting: Family Medicine

## 2014-09-25 ENCOUNTER — Encounter: Payer: Self-pay | Admitting: Family Medicine

## 2014-09-25 ENCOUNTER — Telehealth: Payer: Self-pay | Admitting: Family Medicine

## 2014-09-25 VITALS — BP 110/78 | HR 72 | Temp 97.7°F | Ht 73.0 in | Wt 251.5 lb

## 2014-09-25 DIAGNOSIS — M660 Rupture of popliteal cyst: Secondary | ICD-10-CM | POA: Diagnosis not present

## 2014-09-25 DIAGNOSIS — M1711 Unilateral primary osteoarthritis, right knee: Secondary | ICD-10-CM

## 2014-09-25 MED ORDER — METHYLPREDNISOLONE ACETATE 40 MG/ML IJ SUSP
80.0000 mg | Freq: Once | INTRAMUSCULAR | Status: AC
  Administered 2014-09-25: 80 mg via INTRA_ARTICULAR

## 2014-09-25 NOTE — Telephone Encounter (Signed)
No.  Basically nothing super heavy like barbell squats, but she should be able to work and lift 50 pounds or more without any problem

## 2014-09-25 NOTE — Telephone Encounter (Signed)
Mr. Cianciulli notified as instructed by telephone.

## 2014-09-25 NOTE — Telephone Encounter (Signed)
Pt called after already leaving appt this morning asking if he had any work restrictions? Please MyChart the pt back

## 2014-09-25 NOTE — Addendum Note (Signed)
Addended by: Carter Kitten on: 09/25/2014 10:24 AM   Modules accepted: Orders

## 2014-09-25 NOTE — Progress Notes (Signed)
Dr. Frederico Hamman T. Royston Bekele, MD, [facility] [place], [address] Phone: [phone] Fax: [phone]  09/25/2014  Patient: Clarence Alvarez, MRN: [id number], DOB: 1965/10/22, 49 y.o.  Primary Physician:  Owens Loffler, MD  Chief Complaint: Follow-up  Subjective:   Clarence Alvarez is a 49 y.o. very pleasant male patient who presents with the following:  Pleasant gentleman is here following up after his recent MRI of his knee. I pulled up his films and reviewed all them with him. Initially, was concern for internal derangement, but he does appear to have intact meniscal tissue with some intrasubstance tear, consistent with degenerative changes. His osteoarthritic changes were greater than anticipated. He also had a Baker's cyst but appears to be ruptured.  We reviewed all of this in general. His knee is a little bit better, but he still having some effusion and some pain. His knee braces started to rip.  Past Medical History, Surgical History, Social History, Family History, Problem List, Medications, and Allergies have been reviewed and updated if relevant.  GEN: No fevers, chills. Nontoxic. Primarily MSK c/o today. MSK: Detailed in the HPI GI: tolerating PO intake without difficulty Neuro: No numbness, parasthesias, or tingling associated. Otherwise the pertinent positives of the ROS are noted above.   Objective:   BP 110/78 mmHg  Pulse 72  Temp(Src) 97.7 F (36.5 C) (Oral)  Ht 6\' 1"  (1.854 m)  Wt 251 lb 8 oz (114.08 kg)  BMI 33.19 kg/m2   GEN: WDWN, NAD, Non-toxic, Alert & Oriented x 3 HEENT: Atraumatic, Normocephalic.  Ears and Nose: No external deformity. EXTR: No clubbing/cyanosis/edema NEURO: Normal gait.  PSYCH: Normally interactive. Conversant. Not depressed or anxious appearing.  Calm demeanor.   Knee:  R Gait: Normal heel toe pattern ROM: 0-120 Effusion: neg Echymosis or edema: none Patellar tendon  NT Painful PLICA: neg Patellar grind: pos Medial and lateral patellar facet loading: tender medial and lateral joint lines:NT Mcmurray's neg Flexion-pinch pos Varus and valgus stress: stable Lachman: neg Ant and Post drawer: neg Hip abduction, IR, ER: WNL Hip flexion str: 5/5 Hip abd: 5/5 Quad: 5/5 VMO atrophy:No Hamstring concentric and eccentric: 5/5   Radiology: Mr Knee Right Wo Contrast  09/16/2014   CLINICAL DATA:  Right knee injury 09/06/2014. Medial and posterior knee pain and weakness.  EXAM: MRI OF THE RIGHT KNEE WITHOUT CONTRAST  TECHNIQUE: Multiplanar, multisequence MR imaging of the knee was performed. No intravenous contrast was administered.  COMPARISON:  Radiographs 09/11/2014  FINDINGS: MENISCI  Medial meniscus: Mild intrasubstance degenerative changes but no discrete meniscal tear.  Lateral meniscus:  Intact.  Mild free edge fraying.  LIGAMENTS  Cruciates:  Intact.  Collaterals:  Intact.  CARTILAGE  Patellofemoral: Moderate degenerative chondrosis most significantly involving the medial facet with areas of near full-thickness cartilage loss but no subchondral cystic change (grade 3 chondromalacia).  Medial: Mild to moderate degenerative chondrosis with joint space narrowing and osteophytic spurring.  Lateral:  Mild degenerative chondrosis.  Joint:  Small joint effusion and mild synovitis.  Popliteal Fossa:  Small leaking Baker's cyst.  Extensor Mechanism: The patella retinacular structures are intact and the quadriceps and patellar tendons are intact. There is significant proximal patellar tendinopathy and mild mid distal patellar tendinopathy.  Bones: No acute bony findings. No bone contusion, marrow edema or osteochondral lesion.  Other:  Mild prepatellar bursitis.  IMPRESSION: 1. Intact ligamentous structures and no acute bony findings. 2. No meniscal tears. 3.  Areas of grade 3 chondromalacia involving the patellofemoral joint. 4. Small joint effusion and mild synovitis. Small  leaking Baker's cyst. 5. Significant proximal patellar tendinopathy and mild mid distal patellar tendinopathy. 6. Mild prepatellar bursitis.   Electronically Signed   By: Marijo Sanes M.D.   On: 09/16/2014 14:32   Dg Knee Ap/lat W/sunrise Right  09/11/2014   CLINICAL DATA:  RIGHT knee pain, has been exercising over last couple weeks, jumped off bleachers 1 week ago  EXAM: RIGHT KNEE 3 VIEWS  COMPARISON:  None  FINDINGS: Mild medial compartment joint space narrowing.  Osseous mineralization grossly normal for technique.  Patellar spurs at quadriceps and patellar tendon insertions.  No acute fracture, dislocation or bone destruction.  No definite knee joint effusion.  IMPRESSION: Degenerative changes without definite acute bony abnormalities.   Electronically Signed   By: Lavonia Dana M.D.   On: 09/11/2014 17:30    Assessment and Plan:   Primary osteoarthritis of right knee  Baker's cyst, ruptured  At this point, mostly degenerative. Nothing appears surgical. Menisci appear intact to me as well with only mild intrasubstance tearing  Given patellar-J brace  Knee Injection, R Patient verbally consented to procedure. Risks (including potential rare risk of infection), benefits, and alternatives explained. Sterilely prepped with Chloraprep. Ethyl cholride used for anesthesia. 8 cc Lidocaine 1% mixed with Depo-Medrol 80 mg injected using the anteromedial approach without difficulty. No complications with procedure and tolerated well. Patient had decreased pain post-injection.   Follow-up: prn  Signed,  Tinya Cadogan T. Rosella Crandell, MD   Patient's Medications  New Prescriptions   No medications on file  Previous Medications   ALBUTEROL (PROVENTIL HFA;VENTOLIN HFA) 108 (90 BASE) MCG/ACT INHALER    Inhale 2 puffs into the lungs every 6 (six) hours as needed for wheezing or shortness of breath.   CETIRIZINE (ZYRTEC) 10 MG TABLET    Take 10 mg by mouth daily.   FLOVENT HFA 44 MCG/ACT INHALER    USE 2 PUFFS  2 TIMES A DAY   FLUTICASONE (FLONASE) 50 MCG/ACT NASAL SPRAY    USE 2 SPRAYS IN EACH NOSTRIL ONCE A DAY   MULTIPLE VITAMINS-MINERALS (MEGA MULTIVITAMIN FOR MEN) TABS    Take by mouth daily.   OMEPRAZOLE (PRILOSEC) 40 MG CAPSULE    TAKE 1 CAPSULE BY MOUTH ONCE A DAY  Modified Medications   No medications on file  Discontinued Medications   No medications on file

## 2014-09-25 NOTE — Progress Notes (Signed)
Pre visit review using our clinic review tool, if applicable. No additional management support is needed unless otherwise documented below in the visit note. 

## 2014-10-01 ENCOUNTER — Telehealth: Payer: Self-pay | Admitting: Family Medicine

## 2014-10-01 NOTE — Telephone Encounter (Signed)
Pt called stating knee injection helped, but knee brace does not fit properly. He states it keeps sliding down his leg. Also brackets on left and right side are causing discomfort. He has worn brace all day/everyday since you told him to start wearing it. He switched back to old brace for today even though it still causes him discomfort but not as much as the new brace. Please advise.  [phone]

## 2014-10-01 NOTE — Telephone Encounter (Signed)
I spoke to our [employer] rep, and we can change the brace size. I would probably go one down, but I want him to come in and only have Butch Penny fit this exchange.   The hinges have to be on the joint line or it feels strange - so we need to go over careful instructions with fitting.  Thanks. Electronically Signed  By: Owens Loffler, MD On: 10/01/2014 3:34 PM

## 2014-10-01 NOTE — Telephone Encounter (Signed)
Clarence Alvarez notified as instructed by telephone.  He will stop by in the morning around 8 am.

## 2014-10-01 NOTE — Telephone Encounter (Signed)
Working on it. Will need to get brace company input - have asked their rep.

## 2014-11-24 ENCOUNTER — Other Ambulatory Visit: Payer: Self-pay | Admitting: Family Medicine

## 2014-12-26 ENCOUNTER — Other Ambulatory Visit: Payer: Self-pay | Admitting: Family Medicine

## 2015-05-24 ENCOUNTER — Other Ambulatory Visit: Payer: Self-pay | Admitting: Family Medicine

## 2015-07-13 DIAGNOSIS — M5030 Other cervical disc degeneration, unspecified cervical region: Secondary | ICD-10-CM

## 2015-07-13 HISTORY — DX: Other cervical disc degeneration, unspecified cervical region: M50.30

## 2015-07-13 HISTORY — PX: CERVICAL FUSION: SHX112

## 2015-07-13 HISTORY — PX: OTHER SURGICAL HISTORY: SHX169

## 2015-07-30 ENCOUNTER — Encounter: Payer: Self-pay | Admitting: Family Medicine

## 2015-07-30 ENCOUNTER — Ambulatory Visit (INDEPENDENT_AMBULATORY_CARE_PROVIDER_SITE_OTHER): Payer: Commercial Managed Care - PPO | Admitting: Family Medicine

## 2015-07-30 ENCOUNTER — Ambulatory Visit: Payer: Self-pay | Admitting: Family Medicine

## 2015-07-30 VITALS — BP 122/82 | HR 74 | Temp 98.3°F | Wt 244.5 lb

## 2015-07-30 DIAGNOSIS — J0110 Acute frontal sinusitis, unspecified: Secondary | ICD-10-CM | POA: Diagnosis not present

## 2015-07-30 MED ORDER — FLUTICASONE PROPIONATE 50 MCG/ACT NA SUSP: 2.0000 | Freq: Every day | NASAL | Status: DC

## 2015-07-30 NOTE — Patient Instructions (Signed)
It was great to meet you. Finish course of Augmentin.  Restart Flonase, Ibuprofen up to 800 mg three times a day as needed (with food) for pain, pressure and fever.

## 2015-07-30 NOTE — Progress Notes (Signed)
SUBJECTIVE:  Clarence Alvarez is a 50 y.o. male who complains of coryza, congestion, sneezing, post nasal drip, productive cough and bilateral sinus pain for 20 days. He denies a history of anorexia and chest pain and admits to a history of asthma. Patient denies smoke cigarettes.   Has been on Augmentin for this for 4 days but still having a lot of sinus and tooth pain.  Fevers have resolved. Current Outpatient Prescriptions on File Prior to Visit  Medication Sig Dispense Refill  . albuterol (PROVENTIL HFA;VENTOLIN HFA) 108 (90 BASE) MCG/ACT inhaler Inhale 2 puffs into the lungs every 6 (six) hours as needed for wheezing or shortness of breath.    . cetirizine (ZYRTEC) 10 MG tablet Take 10 mg by mouth daily.    Marland Kitchen FLOVENT HFA 44 MCG/ACT inhaler USE 2 PUFFS 2 TIMES A DAY 10.6 Inhaler 0  . fluticasone (FLONASE) 50 MCG/ACT nasal spray USE 2 SPRAYS IN EACH NOSTRIL ONCE A DAY 16 g 3  . Multiple Vitamins-Minerals (MEGA MULTIVITAMIN FOR MEN) TABS Take by mouth daily.    Marland Kitchen omeprazole (PRILOSEC) 40 MG capsule TAKE 1 CAPSULE BY MOUTH ONCE A DAY 90 capsule 1  . [DISCONTINUED] albuterol (PROVENTIL,VENTOLIN) 90 MCG/ACT inhaler Inhale 2 puffs into the lungs every 6 (six) hours as needed for wheezing. 17 g 2   No current facility-administered medications on file prior to visit.    No Known Allergies  Past Medical History  Diagnosis Date  . Asthma exacerbation, mild   . Allergic rhinitis due to pollen   . Plantar fasciitis     left foot    Past Surgical History  Procedure Laterality Date  . Shoulder arthroscopy w/ rotator cuff repair  2012    Dr. Noemi Chapel, RTC repair, labral repair, subacromial decompression  . Vasectomy      Family History  Problem Relation Age of Onset  . Diabetes Paternal Grandmother   . Diabetes Maternal Grandmother   . Colon cancer Maternal Grandmother 87  . Mental illness Brother   . Mental illness Sister   . Mental illness Daughter   . Stomach cancer Maternal  Grandfather   . COPD Other     Aunt  . Diabetes Other     siblings  . Diabetes Mother   . Hepatitis C Mother   . Prostate cancer Father   . Pancreatic cancer Paternal Aunt   . Esophageal cancer Neg Hx   . Rectal cancer Neg Hx     Social History   Social History  . Marital Status: Married    Spouse Name: N/A  . Number of Children: N/A  . Years of Education: N/A   Occupational History  . facility and grounds manager     Head maintenance at [employer] History Main Topics  . Smoking status: Former Smoker -- 3 years    Quit date: 07/12/1984  . Smokeless tobacco: Current User    Types: Snuff     Comment: "EXPERIMENTED" W/SMOKING--NOT EVEN A PACK X 1 WEEK.   . Alcohol Use: Yes     Comment: 1-2 drinks per week  . Drug Use: No  . Sexual Activity: Not on file   Other Topics Concern  . Not on file   Social History Narrative   Wife is a Designer, jewellery at the [employer]   [place], [place], Social History, Family History, Medications, and allergies have been reviewed in [facility], and have been updated if relevant.  OBJECTIVE: BP 122/82 mmHg  Pulse 74  Temp(Src) 98.3 F (36.8 C) (Oral)  Wt 244 lb 8 oz (110.904 kg)  SpO2 96%  He appears well, vital signs are as noted. Ears normal.  Throat and pharynx normal.  Neck supple. No adenopathy in the neck. Nose is congested. Sinuses tender throughout. The chest is clear, without wheezes or rales.  ASSESSMENT:  sinusitis  PLAN: Finish course of amoxicillin.  eRx sent for flonase. Symptomatic therapy suggested: push fluids, rest and return office visit prn if symptoms persist or worsen.  Call or return to clinic prn if these symptoms worsen or fail to improve as anticipated.

## 2015-07-30 NOTE — Progress Notes (Signed)
Pre visit review using our clinic review tool, if applicable. No additional management support is needed unless otherwise documented below in the visit note. 

## 2015-08-19 ENCOUNTER — Other Ambulatory Visit: Payer: Self-pay | Admitting: Family Medicine

## 2015-08-19 DIAGNOSIS — Z79899 Other long term (current) drug therapy: Secondary | ICD-10-CM

## 2015-08-19 DIAGNOSIS — Z125 Encounter for screening for malignant neoplasm of prostate: Secondary | ICD-10-CM

## 2015-08-19 DIAGNOSIS — Z13220 Encounter for screening for lipoid disorders: Secondary | ICD-10-CM

## 2015-08-20 ENCOUNTER — Other Ambulatory Visit (INDEPENDENT_AMBULATORY_CARE_PROVIDER_SITE_OTHER): Payer: Commercial Managed Care - PPO

## 2015-08-20 DIAGNOSIS — Z13220 Encounter for screening for lipoid disorders: Secondary | ICD-10-CM

## 2015-08-20 DIAGNOSIS — R7309 Other abnormal glucose: Secondary | ICD-10-CM

## 2015-08-20 DIAGNOSIS — Z125 Encounter for screening for malignant neoplasm of prostate: Secondary | ICD-10-CM | POA: Diagnosis not present

## 2015-08-20 DIAGNOSIS — R7989 Other specified abnormal findings of blood chemistry: Secondary | ICD-10-CM | POA: Diagnosis not present

## 2015-08-20 DIAGNOSIS — Z79899 Other long term (current) drug therapy: Secondary | ICD-10-CM

## 2015-08-20 LAB — LIPID PANEL
Cholesterol: 203 mg/dL — ABNORMAL HIGH (ref 0–200)
HDL: 34.8 mg/dL — ABNORMAL LOW
NonHDL: 167.78
Total CHOL/HDL Ratio: 6
Triglycerides: 226 mg/dL — ABNORMAL HIGH (ref 0.0–149.0)
VLDL: 45.2 mg/dL — ABNORMAL HIGH (ref 0.0–40.0)

## 2015-08-20 LAB — HEPATIC FUNCTION PANEL
ALT: 34 U/L (ref 0–53)
AST: 43 U/L — ABNORMAL HIGH (ref 0–37)
Albumin: 4.1 g/dL (ref 3.5–5.2)
Alkaline Phosphatase: 65 U/L (ref 39–117)
Bilirubin, Direct: 0.1 mg/dL (ref 0.0–0.3)
Total Bilirubin: 0.5 mg/dL (ref 0.2–1.2)
Total Protein: 6.9 g/dL (ref 6.0–8.3)

## 2015-08-20 LAB — BASIC METABOLIC PANEL
BUN: 18 mg/dL (ref 6–23)
CO2: 33 mEq/L — ABNORMAL HIGH (ref 19–32)
Calcium: 9.6 mg/dL (ref 8.4–10.5)
Chloride: 101 mEq/L (ref 96–112)
Creatinine, Ser: 1.24 mg/dL (ref 0.40–1.50)
GFR: 78.99 mL/min (ref >60.00)
Glucose, Bld: 121 mg/dL — ABNORMAL HIGH (ref 70–99)
Potassium: 4.2 mEq/L (ref 3.5–5.1)
Sodium: 140 mEq/L (ref 135–145)

## 2015-08-20 LAB — CBC WITH DIFFERENTIAL/PLATELET
Basophils Absolute: 0 10*3/uL (ref 0.0–0.1)
Basophils Relative: 0.7 % (ref 0.0–3.0)
Eosinophils Absolute: 0.2 10*3/uL (ref 0.0–0.7)
Eosinophils Relative: 4.9 % (ref 0.0–5.0)
HCT: 46.9 % (ref 39.0–52.0)
Hemoglobin: 15.8 g/dL (ref 13.0–17.0)
Lymphocytes Relative: 59 % — ABNORMAL HIGH (ref 12.0–46.0)
Lymphs Abs: 2.3 10*3/uL (ref 0.7–4.0)
MCHC: 33.7 g/dL (ref 30.0–36.0)
MCV: 88.7 fl (ref 78.0–100.0)
Monocytes Absolute: 0.3 10*3/uL (ref 0.1–1.0)
Monocytes Relative: 8.7 % (ref 3.0–12.0)
Neutro Abs: 1 10*3/uL — ABNORMAL LOW (ref 1.4–7.7)
Neutrophils Relative %: 26.7 % — ABNORMAL LOW (ref 43.0–77.0)
Platelets: 169 10*3/uL (ref 150.0–400.0)
RBC: 5.29 Mil/uL (ref 4.22–5.81)
RDW: 13.6 % (ref 11.5–15.5)
WBC: 3.8 10*3/uL — ABNORMAL LOW (ref 4.0–10.5)

## 2015-08-20 LAB — PSA: PSA: 0.5 ng/mL (ref 0.10–4.00)

## 2015-08-20 LAB — LDL CHOLESTEROL, DIRECT: Direct LDL: 109 mg/dL

## 2015-08-20 LAB — HEMOGLOBIN A1C: Hgb A1c MFr Bld: 6.2 % (ref 4.6–6.5)

## 2015-08-25 ENCOUNTER — Encounter: Payer: Self-pay | Admitting: Family Medicine

## 2015-08-25 ENCOUNTER — Ambulatory Visit (INDEPENDENT_AMBULATORY_CARE_PROVIDER_SITE_OTHER): Payer: Commercial Managed Care - PPO | Admitting: Family Medicine

## 2015-08-25 VITALS — BP 118/76 | HR 84 | Temp 97.2°F | Ht 71.5 in | Wt 244.8 lb

## 2015-08-25 DIAGNOSIS — Z0000 Encounter for general adult medical examination without abnormal findings: Secondary | ICD-10-CM

## 2015-08-25 MED ORDER — LEVOFLOXACIN 500 MG PO TABS: 500.0000 mg | ORAL_TABLET | Freq: Every day | ORAL | Status: DC

## 2015-08-25 NOTE — Progress Notes (Signed)
Dr. Frederico Hamman T. Maricela Kawahara, MD, [facility] [place], [address] Phone: [phone] Fax: [phone]  08/25/2015  Patient: Clarence Alvarez, MRN: [id number], DOB: 1965/11/16, 50 y.o.  Primary Physician:  Owens Loffler, MD   Chief Complaint  Patient presents with  . Annual Exam   Subjective:   Clarence Alvarez is a 50 y.o. pleasant patient who presents with the following:  Preventative Health Maintenance Visit:  Health Maintenance Summary Reviewed and updated, unless pt declines services.  Tobacco History Reviewed. Alcohol: No concerns, no excessive use. About 1 a day Exercise Habits: Some activity, rec at least 30 mins 5 times a week STD concerns: no risk or activity to increase risk Drug Use: None Encouraged self-testicular check  Stress a lot at work.  Health Maintenance  Topic Date Due  . Hepatitis C Screening  [birth year]  . HIV Screening  06/23/1979  . TETANUS/TDAP  05/12/2014  . INFLUENZA VACCINE  02/10/2016  . COLONOSCOPY  03/26/2019   Immunization History  Administered Date(s) Administered  . Influenza Whole 04/11/2012  . Td 05/12/2004   Patient Active Problem List   Diagnosis Date Noted  . OSA (obstructive sleep apnea) 01/26/2013  . ALLERGIC RHINITIS, SEASONAL 09/15/2009  . ASTHMA, PERSISTENT 09/08/2006   Past Medical History  Diagnosis Date  . Asthma exacerbation, mild   . Allergic rhinitis due to pollen   . Plantar fasciitis     left foot   Past Surgical History  Procedure Laterality Date  . Shoulder arthroscopy w/ rotator cuff repair  2012    Dr. Noemi Chapel, RTC repair, labral repair, subacromial decompression  . Vasectomy     Social History   Social History  . Marital Status: Married    Spouse Name: N/A  . Number of Children: N/A  . Years of Education: N/A   Occupational History  . facility and grounds manager     Head maintenance at [employer] History Main  Topics  . Smoking status: Former Smoker -- 3 years    Quit date: 07/12/1984  . Smokeless tobacco: Current User    Types: Snuff     Comment: "EXPERIMENTED" W/SMOKING--NOT EVEN A PACK X 1 WEEK.   . Alcohol Use: Yes     Comment: 1-2 drinks per week  . Drug Use: No  . Sexual Activity: Not on file   Other Topics Concern  . Not on file   Social History Narrative   Wife is a Designer, jewellery at the [employer]   Family History  Problem Relation Age of Onset  . Diabetes Paternal Grandmother   . Diabetes Maternal Grandmother   . Colon cancer Maternal Grandmother 87  . Mental illness Brother   . Mental illness Sister   . Mental illness Daughter   . Stomach cancer Maternal Grandfather   . COPD Other     Aunt  . Diabetes Other     siblings  . Diabetes Mother   . Hepatitis C Mother   . Prostate cancer Father   . Pancreatic cancer Paternal Aunt   . Esophageal cancer Neg Hx   . Rectal cancer Neg Hx    No Known Allergies  Medication list has been reviewed and updated.   General: Denies fever, chills, sweats. No significant weight loss. Eyes: Denies blurring,significant itching ENT: Denies earache, sore throat, and hoarseness. Cardiovascular: Denies chest pains, palpitations, dyspnea on exertion Respiratory: Denies cough, dyspnea at rest,wheeezing Breast: no  concerns about lumps GI: Denies nausea, vomiting, diarrhea, constipation, change in bowel habits, abdominal pain, melena, hematochezia GU: Denies penile discharge, ED, urinary flow / outflow problems. No STD concerns. Musculoskeletal: Denies back pain, joint pain Derm: Denies rash, itching Neuro: Denies  paresthesias, frequent falls, frequent headaches Psych: A LOT OF STRESS AT WORK, WORKING A LOT Endocrine: Denies cold intolerance, heat intolerance, polydipsia Heme: Denies enlarged lymph nodes Allergy: No hayfever  Objective:   BP 118/76 mmHg  Pulse 84  Temp(Src) 97.2 F (36.2 C) (Oral)  Ht 5' 11.5" (1.816 m)  Wt  244 lb 12 oz (111.018 kg)  BMI 33.66 kg/m2 Ideal Body Weight: Weight in (lb) to have BMI = 25: 181.4  No exam data present  GEN: well developed, well nourished, no acute distress Eyes: conjunctiva and lids normal, PERRLA, EOMI ENT: TM clear, nares clear, oral exam WNL Neck: supple, no lymphadenopathy, no thyromegaly, no JVD Pulm: clear to auscultation and percussion, respiratory effort normal CV: regular rate and rhythm, S1-S2, no murmur, rub or gallop, no bruits, peripheral pulses normal and symmetric, no cyanosis, clubbing, edema or varicosities GI: soft, non-tender; no hepatosplenomegaly, masses; active bowel sounds all quadrants GU: no hernia, testicular mass, penile discharge Lymph: no cervical, axillary or inguinal adenopathy MSK: gait normal, muscle tone and strength WNL, no joint swelling, effusions, discoloration, crepitus  SKIN: clear, good turgor, color WNL, no rashes, lesions, or ulcerations Neuro: normal mental status, normal strength, sensation, and motion Psych: alert; oriented to person, place and time, normally interactive and not anxious or depressed in appearance. All labs reviewed with patient.  Lipids:    Component Value Date/Time   CHOL 203* 08/20/2015 0751   TRIG 226.0* 08/20/2015 0751   HDL 34.80* 08/20/2015 0751   LDLDIRECT 109.0 08/20/2015 0751   VLDL 45.2* 08/20/2015 0751   CHOLHDL 6 08/20/2015 0751   CBC: CBC Latest Ref Rng 08/20/2015 01/17/2014 01/22/2013  WBC 4.0 - 10.5 K/uL 3.8 Repeated and verified X2.(L) 3.9(L) 4.0(L)  Hemoglobin 13.0 - 17.0 g/dL 15.8 15.9 15.9  Hematocrit 39.0 - 52.0 % 46.9 45.3 47.2  Platelets 150.0 - 400.0 K/uL 169.0 184 413.2    Basic Metabolic Panel:    Component Value Date/Time   NA 140 08/20/2015 0751   K 4.2 08/20/2015 0751   CL 101 08/20/2015 0751   CO2 33* 08/20/2015 0751   BUN 18 08/20/2015 0751   CREATININE 1.24 08/20/2015 0751   CREATININE 1.28 01/17/2014 1732   GLUCOSE 121* 08/20/2015 0751   CALCIUM 9.6  08/20/2015 0751   Hepatic Function Latest Ref Rng 08/20/2015 01/17/2014 01/22/2013  Total Protein 6.0 - 8.3 g/dL 6.9 7.4 7.7  Albumin 3.5 - 5.2 g/dL 4.1 4.5 4.5  AST 0 - 37 U/L 43(H) 43(H) 54(H)  ALT 0 - 53 U/L 34 42 46  Alk Phosphatase 39 - 117 U/L 65 83 75  Total Bilirubin 0.2 - 1.2 mg/dL 0.5 0.5 1.0  Bilirubin, Direct 0.0 - 0.3 mg/dL 0.1 0.1 0.1    Lab Results  Component Value Date   TSH 2.584 05/06/2009   Lab Results  Component Value Date   PSA 0.50 08/20/2015   PSA 0.43 01/22/2013   PSA 0.40 06/15/2011    Assessment and Plan:   Healthcare maintenance  Health Maintenance Exam: The patient's preventative maintenance and recommended screening tests for an annual wellness exam were reviewed in full today. Brought up to date unless services declined.  Counselled on the importance of diet, exercise, and its role in overall  health and mortality. The patient's FH and SH was reviewed, including their home life, tobacco status, and drug and alcohol status.  LVQ, recurrent sinusitis  Work on stress, more exercise, losing weight  Follow-up: No Follow-up on file. Unless noted, follow-up in 1 year for Health Maintenance Exam.  New Prescriptions   LEVOFLOXACIN (LEVAQUIN) 500 MG TABLET    Take 1 tablet (500 mg total) by mouth daily.   Signed,  Maud Deed. Pamla Pangle, MD   Patient's Medications  New Prescriptions   LEVOFLOXACIN (LEVAQUIN) 500 MG TABLET    Take 1 tablet (500 mg total) by mouth daily.  Previous Medications   ALBUTEROL (PROVENTIL HFA;VENTOLIN HFA) 108 (90 BASE) MCG/ACT INHALER    Inhale 2 puffs into the lungs every 6 (six) hours as needed for wheezing or shortness of breath.   CETIRIZINE (ZYRTEC) 10 MG TABLET    Take 10 mg by mouth daily.   FLOVENT HFA 44 MCG/ACT INHALER    USE 2 PUFFS 2 TIMES A DAY   FLUTICASONE (FLONASE) 50 MCG/ACT NASAL SPRAY    Place 2 sprays into both nostrils daily.   MULTIPLE VITAMINS-MINERALS (MEGA MULTIVITAMIN FOR MEN) TABS    Take by mouth  daily.   OMEPRAZOLE (PRILOSEC) 40 MG CAPSULE    TAKE 1 CAPSULE BY MOUTH ONCE A DAY  Modified Medications   No medications on file  Discontinued Medications   AMOXICILLIN-CLAVULANATE (AUGMENTIN) 875-125 MG TABLET    Take 1 tablet by mouth 2 (two) times daily.

## 2015-08-25 NOTE — Progress Notes (Signed)
Pre visit review using our clinic review tool, if applicable. No additional management support is needed unless otherwise documented below in the visit note. 

## 2015-11-30 ENCOUNTER — Other Ambulatory Visit: Payer: Self-pay | Admitting: Family Medicine

## 2015-12-03 ENCOUNTER — Telehealth: Payer: Self-pay | Admitting: *Deleted

## 2015-12-03 MED ORDER — PREDNISONE 20 MG PO TABS: ORAL_TABLET | ORAL | Status: DC

## 2015-12-03 NOTE — Telephone Encounter (Signed)
Spoke to Dr. [name] and was advised that it is okay to call in Prednisone for patient. Patient to schedule follow-up appointment on his asthma this summer. Pharmacy [facility] Patient notified by telephone and appointment scheduled for July.

## 2015-12-03 NOTE — Telephone Encounter (Signed)
Patient called requesting a refill on Prednisone because he is having a flare up with his asthma.  Patient stated that the symptoms started last Thursday.  Patient stated that Dr. [name] gave him Prednisone last year for the flare up.

## 2016-01-14 ENCOUNTER — Ambulatory Visit (INDEPENDENT_AMBULATORY_CARE_PROVIDER_SITE_OTHER): Payer: Commercial Managed Care - PPO | Admitting: Family Medicine

## 2016-01-14 ENCOUNTER — Encounter: Payer: Self-pay | Admitting: Family Medicine

## 2016-01-14 VITALS — BP 110/72 | HR 71 | Temp 98.3°F | Ht 71.5 in | Wt 233.2 lb

## 2016-01-14 DIAGNOSIS — J4540 Moderate persistent asthma, uncomplicated: Secondary | ICD-10-CM

## 2016-01-14 MED ORDER — FLUTICASONE PROPIONATE HFA 110 MCG/ACT IN AERO: 1.0000 | INHALATION_SPRAY | Freq: Two times a day (BID) | RESPIRATORY_TRACT | Status: DC

## 2016-01-14 NOTE — Progress Notes (Signed)
Pre visit review using our clinic review tool, if applicable. No additional management support is needed unless otherwise documented below in the visit note. 

## 2016-01-14 NOTE — Progress Notes (Signed)
Dr. Frederico Hamman T. Diani Jillson, MD, [facility] [place], [address] Phone: [phone] Fax: [phone]  01/14/2016  Patient: Clarence Alvarez, MRN: [id number], DOB: 03-Feb-1966, 50 y.o.  Primary Physician:  Owens Loffler, MD   Chief Complaint  Patient presents with  . Follow-up    Asthma   Subjective:   Clarence Alvarez is a 50 y.o. very pleasant male patient who presents with the following:  A while back, got into some dust with working. Did not have on a mask. Felt a lot of tightness. Terrible coughing spells. Albuterol did not help.   At that point, called him in some prednisone at the end of May, 2017. He has not been completely compliant with using his medication. Some of this is a cost prohibitive.   Past Medical History, Surgical History, Social History, Family History, Problem List, Medications, and Allergies have been reviewed and updated if relevant.  Patient Active Problem List   Diagnosis Date Noted  . OSA (obstructive sleep apnea) 01/26/2013  . ALLERGIC RHINITIS, SEASONAL 09/15/2009  . ASTHMA, PERSISTENT 09/08/2006    Past Medical History  Diagnosis Date  . Asthma exacerbation, mild   . Allergic rhinitis due to pollen   . Plantar fasciitis     left foot    Past Surgical History  Procedure Laterality Date  . Shoulder arthroscopy w/ rotator cuff repair  2012    Dr. Noemi Chapel, RTC repair, labral repair, subacromial decompression  . Vasectomy      Social History   Social History  . Marital Status: Married    Spouse Name: N/A  . Number of Children: N/A  . Years of Education: N/A   Occupational History  . 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 Smoking status: Former Smoker -- 3 years    Quit date: 07/12/1984  . Smokeless tobacco: Current User    Types: Snuff     Comment: "EXPERIMENTED" W/SMOKING--NOT EVEN A PACK X 1 WEEK.   .  Alcohol Use: Yes     Comment: 1-2 drinks per week  . Drug Use: No  . Sexual Activity: Not on file   Other Topics Concern  . Not on file   Social History Narrative   Wife is a Designer, jewellery at the [employer]    Family History  Problem Relation Age of Onset  . Diabetes Paternal Grandmother   . Diabetes Maternal Grandmother   . Colon cancer Maternal Grandmother 87  . Mental illness Brother   . Mental illness Sister   . Mental illness Daughter   . Stomach cancer Maternal Grandfather   . COPD Other     Aunt  . Diabetes Other     siblings  . Diabetes Mother   . Hepatitis C Mother   . Prostate cancer Father   . Pancreatic cancer Paternal Aunt   . Esophageal cancer Neg Hx   . Rectal cancer Neg Hx     No Known Allergies  Medication list reviewed and updated in full in [place].   GEN: No acute illnesses, no fevers, chills. GI: No n/v/d, eating normally Pulm: No SOB Interactive and getting along well at home.  Otherwise, ROS is as per the HPI.  Objective:   BP 110/72 mmHg  Pulse 71  Temp(Src) 98.3 F (36.8 C) (Oral)  Ht 5' 11.5" (1.816 m)  Wt 233  lb 4 oz (105.802 kg)  BMI 32.08 kg/m2  SpO2 97%  GEN: WDWN, NAD, Non-toxic, A & O x 3 HEENT: Atraumatic, Normocephalic. Neck supple. No masses, No LAD. Ears and Nose: No external deformity. CV: RRR, No M/G/R. No JVD. No thrill. No extra heart sounds. PULM: CTA B, no wheezes, crackles, rhonchi. No retractions. No resp. distress. No accessory muscle use. EXTR: No c/c/e NEURO Normal gait.  PSYCH: Normally interactive. Conversant. Not depressed or anxious appearing.  Calm demeanor.   Laboratory and Imaging Data:  Assessment and Plan:   Asthma, moderate persistent, uncomplicated  I strongly encouraged him to start using his medicine twice a day. We should be able to decrease the cost if we increase the strength of his Flovent and had them only do 1 puff a day twice a day.  He is going to call me if a  different inhaled corticosteroid is cheaper on his formulary.  Follow-up: annual exam  New Prescriptions   FLUTICASONE (FLOVENT HFA) 110 MCG/ACT INHALER    Inhale 1 puff into the lungs 2 (two) times daily.   Signed,  Maud Deed. Jasia Hiltunen, MD   Patient's Medications  New Prescriptions   FLUTICASONE (FLOVENT HFA) 110 MCG/ACT INHALER    Inhale 1 puff into the lungs 2 (two) times daily.  Previous Medications   ALBUTEROL (PROVENTIL HFA;VENTOLIN HFA) 108 (90 BASE) MCG/ACT INHALER    Inhale 2 puffs into the lungs every 6 (six) hours as needed for wheezing or shortness of breath.   CETIRIZINE (ZYRTEC) 10 MG TABLET    Take 10 mg by mouth daily.   FLUTICASONE (FLONASE) 50 MCG/ACT NASAL SPRAY    Place 2 sprays into both nostrils daily.   MULTIPLE VITAMINS-MINERALS (MEGA MULTIVITAMIN FOR MEN) TABS    Take by mouth daily.   OMEPRAZOLE (PRILOSEC) 40 MG CAPSULE    TAKE 1 CAPSULE BY MOUTH ONCE A DAY  Modified Medications   No medications on file  Discontinued Medications   FLOVENT HFA 44 MCG/ACT INHALER    USE 2 PUFFS 2 TIMES A DAY   LEVOFLOXACIN (LEVAQUIN) 500 MG TABLET    Take 1 tablet (500 mg total) by mouth daily.   PREDNISONE (DELTASONE) 20 MG TABLET    Take one tablet by mouth two times a day x 5 days,  then one tablet by mouth once daily x 5 days

## 2016-02-29 DIAGNOSIS — M5030 Other cervical disc degeneration, unspecified cervical region: Secondary | ICD-10-CM | POA: Insufficient documentation

## 2016-06-12 ENCOUNTER — Other Ambulatory Visit: Payer: Self-pay | Admitting: Family Medicine

## 2016-07-12 DIAGNOSIS — Z87442 Personal history of urinary calculi: Secondary | ICD-10-CM

## 2016-07-12 HISTORY — PX: KNEE SURGERY: SHX244

## 2016-07-12 HISTORY — DX: Personal history of urinary calculi: Z87.442

## 2016-07-30 DIAGNOSIS — M5412 Radiculopathy, cervical region: Secondary | ICD-10-CM | POA: Diagnosis not present

## 2016-07-31 ENCOUNTER — Other Ambulatory Visit: Payer: Self-pay | Admitting: Family Medicine

## 2016-08-20 ENCOUNTER — Other Ambulatory Visit: Payer: Self-pay | Admitting: Family Medicine

## 2016-08-20 DIAGNOSIS — E7849 Other hyperlipidemia: Secondary | ICD-10-CM

## 2016-08-20 DIAGNOSIS — Z1159 Encounter for screening for other viral diseases: Secondary | ICD-10-CM

## 2016-08-20 DIAGNOSIS — Z125 Encounter for screening for malignant neoplasm of prostate: Secondary | ICD-10-CM

## 2016-08-20 DIAGNOSIS — Z79899 Other long term (current) drug therapy: Secondary | ICD-10-CM

## 2016-08-20 DIAGNOSIS — Z114 Encounter for screening for human immunodeficiency virus [HIV]: Secondary | ICD-10-CM

## 2016-08-20 DIAGNOSIS — R7309 Other abnormal glucose: Secondary | ICD-10-CM

## 2016-08-23 ENCOUNTER — Telehealth: Payer: Self-pay | Admitting: *Deleted

## 2016-08-23 NOTE — Telephone Encounter (Signed)
Clarence Alvarez is scheduled for CPE on 08/30/2016 at 8:30 am with Dr. [name].

## 2016-08-23 NOTE — Telephone Encounter (Signed)
PT sent in the following message via MyChart. Please advise.  Appointment Request From: Clarence Alvarez. Clarence Alvarez    With Provider: Owens Loffler, MD [facility] [place]    Preferred Date Range: Any date 08/26/2016 or later    Preferred Times: Thursday Morning    Reason: To address the following health maintenance concerns.  Hepatitis C Screening  Tetanus/Tdap    Comments:

## 2016-08-24 NOTE — Telephone Encounter (Signed)
noted 

## 2016-08-26 ENCOUNTER — Other Ambulatory Visit (INDEPENDENT_AMBULATORY_CARE_PROVIDER_SITE_OTHER): Payer: Commercial Managed Care - PPO

## 2016-08-26 DIAGNOSIS — Z125 Encounter for screening for malignant neoplasm of prostate: Secondary | ICD-10-CM

## 2016-08-26 DIAGNOSIS — R7309 Other abnormal glucose: Secondary | ICD-10-CM

## 2016-08-26 DIAGNOSIS — Z79899 Other long term (current) drug therapy: Secondary | ICD-10-CM | POA: Diagnosis not present

## 2016-08-26 DIAGNOSIS — E784 Other hyperlipidemia: Secondary | ICD-10-CM

## 2016-08-26 DIAGNOSIS — Z114 Encounter for screening for human immunodeficiency virus [HIV]: Secondary | ICD-10-CM

## 2016-08-26 DIAGNOSIS — Z1159 Encounter for screening for other viral diseases: Secondary | ICD-10-CM | POA: Diagnosis not present

## 2016-08-26 DIAGNOSIS — E7849 Other hyperlipidemia: Secondary | ICD-10-CM

## 2016-08-26 LAB — CBC WITH DIFFERENTIAL/PLATELET
Basophils Absolute: 0 10*3/uL (ref 0.0–0.1)
Basophils Relative: 1.2 % (ref 0.0–3.0)
Eosinophils Absolute: 0.2 10*3/uL (ref 0.0–0.7)
Eosinophils Relative: 5.6 % — ABNORMAL HIGH (ref 0.0–5.0)
HCT: 46.2 % (ref 39.0–52.0)
Hemoglobin: 15.5 g/dL (ref 13.0–17.0)
Lymphocytes Relative: 59.6 % — ABNORMAL HIGH (ref 12.0–46.0)
Lymphs Abs: 1.6 10*3/uL (ref 0.7–4.0)
MCHC: 33.6 g/dL (ref 30.0–36.0)
MCV: 89.5 fl (ref 78.0–100.0)
Monocytes Absolute: 0.3 10*3/uL (ref 0.1–1.0)
Monocytes Relative: 9.8 % (ref 3.0–12.0)
Neutro Abs: 0.6 10*3/uL — ABNORMAL LOW (ref 1.4–7.7)
Neutrophils Relative %: 23.8 % — ABNORMAL LOW (ref 43.0–77.0)
Platelets: 204 10*3/uL (ref 150.0–400.0)
RBC: 5.16 Mil/uL (ref 4.22–5.81)
RDW: 12.7 % (ref 11.5–15.5)
WBC: 2.7 10*3/uL — ABNORMAL LOW (ref 4.0–10.5)

## 2016-08-26 LAB — BASIC METABOLIC PANEL
BUN: 14 mg/dL (ref 6–23)
CO2: 34 mEq/L — ABNORMAL HIGH (ref 19–32)
Calcium: 9.8 mg/dL (ref 8.4–10.5)
Chloride: 101 mEq/L (ref 96–112)
Creatinine, Ser: 1.09 mg/dL (ref 0.40–1.50)
GFR: 91.3 mL/min (ref >60.00)
Glucose, Bld: 110 mg/dL — ABNORMAL HIGH (ref 70–99)
Potassium: 4.8 mEq/L (ref 3.5–5.1)
Sodium: 140 mEq/L (ref 135–145)

## 2016-08-26 LAB — PSA: PSA: 0.44 ng/mL (ref 0.10–4.00)

## 2016-08-26 LAB — LIPID PANEL
Cholesterol: 197 mg/dL (ref 0–200)
HDL: 38.3 mg/dL — ABNORMAL LOW (ref >39.00)
LDL Cholesterol: 136 mg/dL — ABNORMAL HIGH (ref 0–99)
NonHDL: 158.98
Total CHOL/HDL Ratio: 5
Triglycerides: 113 mg/dL (ref 0.0–149.0)
VLDL: 22.6 mg/dL (ref 0.0–40.0)

## 2016-08-26 LAB — HEPATIC FUNCTION PANEL
ALT: 33 U/L (ref 0–53)
AST: 28 U/L (ref 0–37)
Albumin: 4.5 g/dL (ref 3.5–5.2)
Alkaline Phosphatase: 75 U/L (ref 39–117)
Bilirubin, Direct: 0.1 mg/dL (ref 0.0–0.3)
Total Bilirubin: 0.7 mg/dL (ref 0.2–1.2)
Total Protein: 7 g/dL (ref 6.0–8.3)

## 2016-08-26 LAB — HIV ANTIBODY (ROUTINE TESTING W REFLEX): HIV 1&2 Ab, 4th Generation: NONREACTIVE

## 2016-08-26 LAB — HEMOGLOBIN A1C: Hgb A1c MFr Bld: 6.1 % (ref 4.6–6.5)

## 2016-08-27 LAB — HEPATITIS C ANTIBODY: HCV Ab: NEGATIVE

## 2016-08-30 ENCOUNTER — Encounter: Payer: Self-pay | Admitting: Family Medicine

## 2016-08-30 ENCOUNTER — Ambulatory Visit (INDEPENDENT_AMBULATORY_CARE_PROVIDER_SITE_OTHER): Payer: Commercial Managed Care - PPO | Admitting: Family Medicine

## 2016-08-30 VITALS — BP 122/72 | HR 95 | Temp 97.8°F | Ht 71.5 in | Wt 229.2 lb

## 2016-08-30 DIAGNOSIS — D72820 Lymphocytosis (symptomatic): Secondary | ICD-10-CM

## 2016-08-30 DIAGNOSIS — Z0000 Encounter for general adult medical examination without abnormal findings: Secondary | ICD-10-CM
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
## 2016-08-30 NOTE — Progress Notes (Signed)
Pre visit review using our clinic review tool, if applicable. No additional management support is needed unless otherwise documented below in the visit note. 

## 2016-08-31 LAB — PATHOLOGIST SMEAR REVIEW

## 2016-09-07 DIAGNOSIS — M5412 Radiculopathy, cervical region: Secondary | ICD-10-CM | POA: Diagnosis not present

## 2016-10-06 ENCOUNTER — Other Ambulatory Visit: Payer: Self-pay | Admitting: *Deleted

## 2016-10-06 MED ORDER — OMEPRAZOLE 40 MG PO CPDR: 40.0000 mg | DELAYED_RELEASE_CAPSULE | Freq: Every day | ORAL | 3 refills | Status: DC

## 2016-10-27 ENCOUNTER — Other Ambulatory Visit (INDEPENDENT_AMBULATORY_CARE_PROVIDER_SITE_OTHER): Payer: Commercial Managed Care - PPO

## 2016-10-27 DIAGNOSIS — D72820 Lymphocytosis (symptomatic): Secondary | ICD-10-CM | POA: Diagnosis not present

## 2016-10-27 LAB — CBC WITH DIFFERENTIAL/PLATELET
Basophils Absolute: 0 10*3/uL (ref 0.0–0.1)
Basophils Relative: 0.5 % (ref 0.0–3.0)
Eosinophils Absolute: 0.1 10*3/uL (ref 0.0–0.7)
Eosinophils Relative: 3.9 % (ref 0.0–5.0)
HCT: 45.2 % (ref 39.0–52.0)
Hemoglobin: 15.2 g/dL (ref 13.0–17.0)
Lymphocytes Relative: 50.6 % — ABNORMAL HIGH (ref 12.0–46.0)
Lymphs Abs: 1.7 10*3/uL (ref 0.7–4.0)
MCHC: 33.6 g/dL (ref 30.0–36.0)
MCV: 89.6 fl (ref 78.0–100.0)
Monocytes Absolute: 0.4 10*3/uL (ref 0.1–1.0)
Monocytes Relative: 11.5 % (ref 3.0–12.0)
Neutro Abs: 1.1 10*3/uL — ABNORMAL LOW (ref 1.4–7.7)
Neutrophils Relative %: 33.5 % — ABNORMAL LOW (ref 43.0–77.0)
Platelets: 190 10*3/uL (ref 150.0–400.0)
RBC: 5.04 Mil/uL (ref 4.22–5.81)
RDW: 13.9 % (ref 11.5–15.5)
WBC: 3.4 10*3/uL — ABNORMAL LOW (ref 4.0–10.5)

## 2016-10-28 DIAGNOSIS — J4521 Mild intermittent asthma with (acute) exacerbation: Secondary | ICD-10-CM | POA: Diagnosis not present

## 2016-10-28 LAB — PATHOLOGIST SMEAR REVIEW

## 2016-12-23 ENCOUNTER — Ambulatory Visit (INDEPENDENT_AMBULATORY_CARE_PROVIDER_SITE_OTHER)
Admission: RE | Admit: 2016-12-23 | Discharge: 2016-12-23 | Disposition: A | Discharge status: Home / Self Care | Payer: Commercial Managed Care - PPO | Source: Ambulatory Visit | Attending: Family Medicine | Admitting: Family Medicine

## 2016-12-23 ENCOUNTER — Encounter: Payer: Self-pay | Admitting: Family Medicine

## 2016-12-23 ENCOUNTER — Ambulatory Visit (INDEPENDENT_AMBULATORY_CARE_PROVIDER_SITE_OTHER): Payer: Commercial Managed Care - PPO | Admitting: Family Medicine

## 2016-12-23 DIAGNOSIS — M79604 Pain in right leg: Secondary | ICD-10-CM

## 2016-12-23 DIAGNOSIS — M545 Low back pain, unspecified: Secondary | ICD-10-CM | POA: Insufficient documentation

## 2016-12-23 NOTE — Patient Instructions (Signed)
Great to see you.  I will call you with your xray results later today or tomorrow.

## 2016-12-23 NOTE — Progress Notes (Signed)
Pre visit review using our clinic review tool, if applicable. No additional management support is needed unless otherwise documented below in the visit note. 

## 2016-12-23 NOTE — Progress Notes (Signed)
SUBJECTIVE:  Clarence Alvarez is a 51 y.o. male who complains of low back pain for 1 week(s), positional with bending or lifting, with radiation down the legs. Precipitating factors: recent heavy lifting. Prior history of back problems: recurrent self limited episodes of low back pain in the past. There is no numbness in the legs.  Current Outpatient Prescriptions on File Prior to Visit  Medication Sig Dispense Refill  . albuterol (PROVENTIL HFA;VENTOLIN HFA) 108 (90 BASE) MCG/ACT inhaler Inhale 2 puffs into the lungs every 6 (six) hours as needed for wheezing or shortness of breath.    . cetirizine (ZYRTEC) 10 MG tablet Take 10 mg by mouth daily.    . fluticasone (FLONASE) 50 MCG/ACT nasal spray PLACE 2 SPRAYS INTO BOTH NOSTRILS DAILY. 16 g 11  . fluticasone (FLOVENT HFA) 110 MCG/ACT inhaler Inhale 1 puff into the lungs 2 (two) times daily. 1 Inhaler 12  . methocarbamol (ROBAXIN) 500 MG tablet TAKE 1 TABLET BY MOUTH TWICE A DAY AS NEEDED FOR SPASMS DURING THE DAYTIME  1  . omeprazole (PRILOSEC) 40 MG capsule Take 1 capsule (40 mg total) by mouth daily. 90 capsule 3  . cyclobenzaprine (FLEXERIL) 5 MG tablet TAKE 1 TABLET BY MOUTH AT BEDTIME AS NEEDED FOR SPASMS  0  . [DISCONTINUED] albuterol (PROVENTIL,VENTOLIN) 90 MCG/ACT inhaler Inhale 2 puffs into the lungs every 6 (six) hours as needed for wheezing. 17 g 2   No current facility-administered medications on file prior to visit.     No Known Allergies  Past Medical History:  Diagnosis Date  . Allergic rhinitis due to pollen   . Asthma exacerbation, mild   . Plantar fasciitis    left foot    Past Surgical History:  Procedure Laterality Date  . SHOULDER ARTHROSCOPY W/ ROTATOR CUFF REPAIR  2012   Dr. Noemi Chapel, RTC repair, labral repair, subacromial decompression  . VASECTOMY      Family History  Problem Relation Age of Onset  . Diabetes Paternal Grandmother   . Diabetes Maternal Grandmother   . Colon cancer Maternal Grandmother 87   . Mental illness Brother   . Mental illness Sister   . Mental illness Daughter   . Stomach cancer Maternal Grandfather   . COPD Other        Aunt  . Diabetes Other        siblings  . Diabetes Mother   . Hepatitis C Mother   . Prostate cancer Father   . Pancreatic cancer Paternal Aunt   . Esophageal cancer Neg Hx   . Rectal cancer Neg Hx     Social History   Social History  . Marital status: Married    Spouse name: N/A  . Number of children: N/A  . Years of education: N/A   Occupational History  . 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 Smoking status: Former Smoker    Years: 3.00    Quit date: 07/12/1984  . Smokeless tobacco: Former Systems developer    Types: Snuff     Comment: "EXPERIMENTED" W/SMOKING--NOT EVEN A PACK X 1 WEEK.   . Alcohol use Yes     Comment: 1-2 drinks per week  . Drug use: No  . Sexual activity: Not on file   Other Topics Concern  . Not on file   Social History Narrative   Wife is a Designer, jewellery at the [employer]   The  PMH, PSH, Social History, Family History, Medications, and allergies have been reviewed in [facility], and have been updated if relevant.  OBJECTIVE: BP 120/90   Pulse (!) 59   Ht 5' 11.5" (1.816 m)   Wt 231 lb (104.8 kg)   SpO2 99%   BMI 31.77 kg/m   Patient appears to be in mild to moderate pain, antalgic gait noted. Lumbosacral spine area reveals no local tenderness or mass.  Painful and reduced LS ROM noted. Straight leg raise is positive at 45 degrees on right. DTR's, motor strength and sensation normal, including heel and toe gait.  Peripheral pulses are palpable. X-Ray: ordered, but results not yet available.  ASSESSMENT:  lumbar strain and with radiculopathy  PLAN: For acute pain, rest, intermittent application of heat (do not sleep on heating pad), analgesics and muscle relaxants are recommended. Discussed longer term treatment plan of prn NSAID's  and discussed a home back care exercise program with flexion exercise routine. Proper lifting with avoidance of heavy lifting discussed. Consider Physical Therapy and XRay studies if not improving. Call or return to clinic prn if these symptoms worsen or fail to improve as anticipated.

## 2017-01-06 DIAGNOSIS — S46011A Strain of muscle(s) and tendon(s) of the rotator cuff of right shoulder, initial encounter: Secondary | ICD-10-CM | POA: Diagnosis not present

## 2017-01-11 DIAGNOSIS — M25511 Pain in right shoulder: Secondary | ICD-10-CM | POA: Diagnosis not present

## 2017-03-04 ENCOUNTER — Other Ambulatory Visit: Payer: Self-pay | Admitting: Family Medicine

## 2017-03-08 ENCOUNTER — Ambulatory Visit (INDEPENDENT_AMBULATORY_CARE_PROVIDER_SITE_OTHER): Payer: Commercial Managed Care - PPO | Admitting: Primary Care

## 2017-03-08 ENCOUNTER — Encounter: Payer: Self-pay | Admitting: Primary Care

## 2017-03-08 ENCOUNTER — Ambulatory Visit (INDEPENDENT_AMBULATORY_CARE_PROVIDER_SITE_OTHER)
Admission: RE | Admit: 2017-03-08 | Discharge: 2017-03-08 | Disposition: A | Discharge status: Home / Self Care | Payer: Commercial Managed Care - PPO | Source: Ambulatory Visit | Attending: Primary Care | Admitting: Primary Care

## 2017-03-08 VITALS — BP 124/84 | HR 56 | Temp 97.5°F | Ht 71.5 in | Wt 234.0 lb

## 2017-03-08 DIAGNOSIS — R319 Hematuria, unspecified: Secondary | ICD-10-CM

## 2017-03-08 DIAGNOSIS — R109 Unspecified abdominal pain: Secondary | ICD-10-CM | POA: Diagnosis not present

## 2017-03-08 LAB — POC URINALSYSI DIPSTICK (AUTOMATED)
Bilirubin, UA: NEGATIVE
Glucose, UA: NEGATIVE
Ketones, UA: NEGATIVE
Leukocytes, UA: NEGATIVE
Nitrite, UA: NEGATIVE
Protein, UA: NEGATIVE
Spec Grav, UA: 1.025 (ref 1.010–1.025)
Urobilinogen, UA: NEGATIVE E.U./dL — AB
pH, UA: 6.5 (ref 5.0–8.0)

## 2017-03-08 LAB — CBC WITH DIFFERENTIAL/PLATELET
Basophils Absolute: 0 10*3/uL (ref 0.0–0.1)
Basophils Relative: 1 % (ref 0.0–3.0)
Eosinophils Absolute: 0.1 10*3/uL (ref 0.0–0.7)
Eosinophils Relative: 3.4 % (ref 0.0–5.0)
HCT: 44.2 % (ref 39.0–52.0)
Hemoglobin: 14.6 g/dL (ref 13.0–17.0)
Lymphocytes Relative: 54.9 % — ABNORMAL HIGH (ref 12.0–46.0)
Lymphs Abs: 1.7 10*3/uL (ref 0.7–4.0)
MCHC: 33 g/dL (ref 30.0–36.0)
MCV: 90.9 fl (ref 78.0–100.0)
Monocytes Absolute: 0.3 10*3/uL (ref 0.1–1.0)
Monocytes Relative: 10.2 % (ref 3.0–12.0)
Neutro Abs: 0.9 10*3/uL — ABNORMAL LOW (ref 1.4–7.7)
Neutrophils Relative %: 30.5 % — ABNORMAL LOW (ref 43.0–77.0)
Platelets: 186 10*3/uL (ref 150.0–400.0)
RBC: 4.86 Mil/uL (ref 4.22–5.81)
RDW: 13.5 % (ref 11.5–15.5)
WBC: 3.1 10*3/uL — ABNORMAL LOW (ref 4.0–10.5)

## 2017-03-08 LAB — BASIC METABOLIC PANEL
BUN: 14 mg/dL (ref 6–23)
CO2: 35 mEq/L — ABNORMAL HIGH (ref 19–32)
Calcium: 10 mg/dL (ref 8.4–10.5)
Chloride: 102 mEq/L (ref 96–112)
Creatinine, Ser: 1.14 mg/dL (ref 0.40–1.50)
GFR: 86.51 mL/min (ref >60.00)
Glucose, Bld: 94 mg/dL (ref 70–99)
Potassium: 4.7 mEq/L (ref 3.5–5.1)
Sodium: 139 mEq/L (ref 135–145)

## 2017-03-08 MED ORDER — TAMSULOSIN HCL 0.4 MG PO CAPS: 0.4000 mg | ORAL_CAPSULE | Freq: Every day | ORAL | 0 refills | Status: DC

## 2017-03-08 NOTE — Addendum Note (Signed)
Addended by: Jacqualin Combes on: 03/08/2017 01:00 PM   Modules accepted: Orders

## 2017-03-08 NOTE — Progress Notes (Signed)
Subjective:    Patient ID: Clarence Alvarez, male    DOB: 31-Oct-1965, 51 y.o.   MRN: [id number]  HPI  Clarence Alvarez is a 51 year old male who presents today with a chief complaint of left flank pain. He also reports left lower groin, urinary frequency, and hematuria. His symptoms began about 2 weeks ago. He's been increasing his intake of water with temporary improvement. Last night his pain became worse. He's noticed that cranberry juice and ginger ale cause increased pain. He's not taken anything OTC for his symptoms. He denies penile discharge, history of kidneys stones, history of prostate cancer. He thinks he was diagnosed with prostatitis in early 2000's and was treated with antibiotics.   Review of Systems  Constitutional: Negative for fever.  Respiratory: Negative for shortness of breath.   Cardiovascular: Negative for chest pain.  Gastrointestinal: Positive for abdominal pain.  Genitourinary: Positive for flank pain, frequency and hematuria. Negative for discharge, dysuria and testicular pain.       Past Medical History:  Diagnosis Date  . Allergic rhinitis due to pollen   . Asthma exacerbation, mild   . Plantar fasciitis    left foot     Social History   Social History  . Marital status: Married    Spouse name: N/A  . Number of children: N/A  . Years of education: N/A   Occupational History  . 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 Smoking status: Former Smoker    Years: 3.00    Quit date: 07/12/1984  . Smokeless tobacco: Former Systems developer    Types: Snuff     Comment: "EXPERIMENTED" W/SMOKING--NOT EVEN A PACK X 1 WEEK.   . Alcohol use Yes     Comment: 1-2 drinks per week  . Drug use: No  . Sexual activity: Not on file   Other Topics Concern  . Not on file   Social History Narrative   Wife is a Designer, jewellery at the [employer]    Past Surgical History:  Procedure Laterality Date   . SHOULDER ARTHROSCOPY W/ ROTATOR CUFF REPAIR  2012   Dr. Noemi Chapel, RTC repair, labral repair, subacromial decompression  . VASECTOMY      Family History  Problem Relation Age of Onset  . Diabetes Paternal Grandmother   . Diabetes Maternal Grandmother   . Colon cancer Maternal Grandmother 87  . Mental illness Brother   . Mental illness Sister   . Mental illness Daughter   . Stomach cancer Maternal Grandfather   . COPD Other        Aunt  . Diabetes Other        siblings  . Diabetes Mother   . Hepatitis C Mother   . Prostate cancer Father   . Pancreatic cancer Paternal Aunt   . Esophageal cancer Neg Hx   . Rectal cancer Neg Hx     No Known Allergies  Current Outpatient Prescriptions on File Prior to Visit  Medication Sig Dispense Refill  . albuterol (PROVENTIL HFA;VENTOLIN HFA) 108 (90 BASE) MCG/ACT inhaler Inhale 2 puffs into the lungs every 6 (six) hours as needed for wheezing or shortness of breath.    . cetirizine (ZYRTEC) 10 MG tablet Take 10 mg by mouth daily.    Marland Kitchen FLOVENT HFA 110 MCG/ACT inhaler INHALE 1 PUFF INTO THE LUNGS 2 (TWO) TIMES DAILY 12 g 5  . fluticasone (FLONASE)  50 MCG/ACT nasal spray PLACE 2 SPRAYS INTO BOTH NOSTRILS DAILY. 16 g 11  . methocarbamol (ROBAXIN) 500 MG tablet TAKE 1 TABLET BY MOUTH TWICE A DAY AS NEEDED FOR SPASMS DURING THE DAYTIME  1  . omeprazole (PRILOSEC) 40 MG capsule Take 1 capsule (40 mg total) by mouth daily. 90 capsule 3  . cyclobenzaprine (FLEXERIL) 5 MG tablet TAKE 1 TABLET BY MOUTH AT BEDTIME AS NEEDED FOR SPASMS  0  . [DISCONTINUED] albuterol (PROVENTIL,VENTOLIN) 90 MCG/ACT inhaler Inhale 2 puffs into the lungs every 6 (six) hours as needed for wheezing. 17 g 2   No current facility-administered medications on file prior to visit.     BP 124/84   Pulse (!) 56   Temp (!) 97.5 F (36.4 C) (Oral)   Ht 5' 11.5" (1.816 m)   Wt 234 lb (106.1 kg)   SpO2 98%   BMI 32.18 kg/m    Objective:   Physical Exam  Constitutional: He  appears well-nourished.  Appears uncomfortable   Neck: Neck supple.  Cardiovascular: Normal rate and regular rhythm.   Pulmonary/Chest: Effort normal and breath sounds normal.  Abdominal: Soft. Normal appearance. There is no CVA tenderness.    Left lower quadrant and groin pain  Skin: Skin is warm and dry.          Assessment & Plan:  Flank Pain:  Also with hematuria, urinary frequency. Increased pain over last 24 hours. Not taking anything OTC. UA today: negative leuks, nitrites. 3+ blood.  Suspect renal stone given presence of blood without evidence of leuks or nitrites.  Doubt acute prostatitis given lack of other cardinal signs.  Rx for Flomax sent to pharmacy. He declines offer for Tramadol as he would like to try tylenol or Ibuprofen. Push intake of fluids. Given no prior history of renal stones, will obtain renal CT scan.  BMP and CBC pending. Will await results.  Sheral Flow, NP

## 2017-03-08 NOTE — Patient Instructions (Addendum)
Stop by the front desk and speak with either Rosaria Ferries or Shirlean Mylar regarding your CT scan.  Start tamsulosin (Flomax) 0.4 mg capsules to help facilitate kidney stone movement. Take 1 capsule by mouth every morning.  Complete lab work prior to leaving today. I will notify you of your results once received.  You may take tylenol or ibuprofen as needed for pain. Do not exceed 2400 mg of ibuprofen or 3000 mg of tylenol in 24 hours. Please call me if your pain increases.  Continue to push intake of water. Avoid soda.  I'll be in touch later today regarding your CT scan.  It was a pleasure meeting you!

## 2017-03-28 ENCOUNTER — Telehealth: Payer: Self-pay | Admitting: Family Medicine

## 2017-03-28 ENCOUNTER — Other Ambulatory Visit: Payer: Self-pay | Admitting: Family Medicine

## 2017-03-28 DIAGNOSIS — R310 Gross hematuria: Secondary | ICD-10-CM

## 2017-03-28 DIAGNOSIS — N200 Calculus of kidney: Secondary | ICD-10-CM

## 2017-03-28 DIAGNOSIS — N1339 Other hydronephrosis: Secondary | ICD-10-CM

## 2017-03-28 NOTE — Telephone Encounter (Signed)
Caller Name: Darell Saputo  Relationship to Patient: self    Best Number: [phone]  Pharmacy: [facility] [address] [facility]  Reason for call:  Pt recently had CT scan, but is continuing to have blood in urine when he voids.  Patient is requesting to either see PCP or get referral to Urologist.  He is a previous patient of Dr. Link Snuffer at [facility].

## 2017-03-28 NOTE — Telephone Encounter (Signed)
Urology appt made

## 2017-03-29 DIAGNOSIS — N201 Calculus of ureter: Secondary | ICD-10-CM | POA: Diagnosis not present

## 2017-03-29 DIAGNOSIS — Z125 Encounter for screening for malignant neoplasm of prostate: Secondary | ICD-10-CM | POA: Diagnosis not present

## 2017-03-30 ENCOUNTER — Other Ambulatory Visit: Payer: Self-pay | Admitting: Urology

## 2017-04-19 ENCOUNTER — Encounter (HOSPITAL_BASED_OUTPATIENT_CLINIC_OR_DEPARTMENT_OTHER): Payer: Self-pay | Admitting: *Deleted

## 2017-04-19 NOTE — Progress Notes (Signed)
Spoke w/ pt via phone for pre-op interview.  Half way thru pt stated that he passed a stone this morning and may not need procedure.  Advised pt and gave dr Tresa Moore or scheduler phone # and extension # to call.  Dr Tresa Moore may have him come in office for xray to confirm if stone is passed. Pt verbalized understanding , stated he would call .  Pt agreed if he is still on schedule I will call back to finish interniew.

## 2017-04-22 ENCOUNTER — Ambulatory Visit (HOSPITAL_BASED_OUTPATIENT_CLINIC_OR_DEPARTMENT_OTHER): Admit: 2017-04-22 | Payer: Commercial Managed Care - PPO | Admitting: Urology

## 2017-04-22 HISTORY — DX: Other seasonal allergic rhinitis: J30.2

## 2017-04-22 HISTORY — DX: Personal history of colonic polyps: Z86.010

## 2017-04-22 HISTORY — DX: Obstructive sleep apnea (adult) (pediatric): G47.33

## 2017-04-22 HISTORY — DX: Personal history of other diseases of male genital organs: Z87.438

## 2017-04-22 HISTORY — DX: Calculus of ureter: N20.1

## 2017-04-22 HISTORY — DX: Moderate persistent asthma, uncomplicated: J45.40

## 2017-04-22 HISTORY — DX: Personal history of adenomatous and serrated colon polyps: Z86.0101

## 2017-04-22 SURGERY — CYSTOURETEROSCOPY, WITH RETROGRADE PYELOGRAM AND STENT INSERTION
Anesthesia: General | Laterality: Left

## 2017-05-19 ENCOUNTER — Other Ambulatory Visit: Payer: Self-pay | Admitting: Family Medicine

## 2017-05-31 ENCOUNTER — Ambulatory Visit: Payer: Self-pay | Admitting: *Deleted

## 2017-05-31 DIAGNOSIS — J4521 Mild intermittent asthma with (acute) exacerbation: Secondary | ICD-10-CM | POA: Diagnosis not present

## 2017-05-31 NOTE — Telephone Encounter (Signed)
C/o having an asthma attach, also body aches and chills.  Sore throat and getting weaker.   Shortness of breath from the asthma.  After triaging pt he decided he would rather call the "Teledoc" benefit he has through his insurance co.  He has used them before for asthma attaches with good success.  No appts available at the [facility] location where he normally goes.  Instructed him to call back if the Teledoc did not work out for him or he becomes worse.  He verbalized understanding.    Reason for Disposition . [1] Wheezing or coughing AND [2] hasn't used neb or inhaler twice AND [3] it's available  Answer Assessment - Initial Assessment Questions 1. RESPIRATORY STATUS: "Describe your breathing?" (e.g., wheezing, shortness of breath, unable to speak, severe coughing)      Short of breath.  Have not used my inhaler.  Coughing up yellow mucus.  Been using Mucinex it helps with the coughing only.  I'm wheezing it woke me up in the middle of the night.   I had him take a puff on his inhaler while I stayed on the phone with him.     2. ONSET: "When did this asthma attack begin?"      Been going on for 3 days. 3. TRIGGER: "What do you think triggered this attack?" (e.g., URI, exposure to pollen or other allergen, tobacco smoke)      I am having body aches and chills, sore throat, getting weaker with shortness of breath. 4. PEAK EXPIRATORY FLOW RATE (PEFR): "Do you use a peak flow meter?" If so, ask: "What's the current peak flow? What's your personal best peak flow?"      *No Answer* 5. SEVERITY: "How bad is this attack?"    - MILD: No SOB at rest, mild SOB with walking, speaks normally in sentences, can lay down, no retractions, pulse < 100. (GREEN Zone: PEFR 80-100%)   - MODERATE: SOB at rest, SOB with minimal exertion and prefers to sit, cannot lie down flat, speaks in phrases, mild retractions, audible wheezing, pulse 100-120. (YELLOW Zone: PEFR 50-80%)    - SEVERE: Very SOB at rest, speaks in  single words, struggling to breathe, sitting hunched forward, retractions, usually loud wheezing, sometimes minimal wheezing because of decreased air movement, pulse > 120. (RED Zone: PEFR < 50%).      Very tight sounding and coughing. 6. MEDICATIONS (Inhaler or nebs): "What are your asthma medications?" and "What treatments have you given so far?"    - Quick-relief: albuterol, metaproterenol, salbutamol, or other inhaled or nebulized beta-agonist medicines   - Long-term-control: steroids, cromolyn, or other anti-inflammatory medicines.     Albuterol inhaler done.  Flovent and Flonase,  7. OTHER SYMPTOMS: "Do you have any other symptoms? (e.g., runny nose, chest pain, fever)     Sore throat, body aches, chills. 8. PREGNANCY: "Is there any chance you are pregnant?" "When was your last menstrual period?"     N/A  Protocols used: ASTHMA ATTACK-A-AH

## 2017-06-07 ENCOUNTER — Telehealth: Payer: Self-pay | Admitting: Family Medicine

## 2017-06-07 NOTE — Telephone Encounter (Signed)
I spoke with pt and he is not in any distress breathing but has developed some wheeze with congestion and cough. If pt condition worsens or changes before appt on 06/08/17 at 7:45 with Dr Betty Martinique pt will go to [facility] or ED.FYI to Dr Martinique.

## 2017-06-07 NOTE — Telephone Encounter (Signed)
Copied from [place] [id number]. Topic: Quick Communication - See Telephone Encounter >> Jun 07, 2017  2:54 PM Bea Graff, NT wrote: CRM for notification. See Telephone encounter for: Patient is calling and last week he did a visit through [employer] with his insurance and they prescribed him prednisone for his breathing and congestion problems he was having. He has completed the 6 day pack of that and now he states his cough and breathing problems are coming back. He wants to see if Dr. [name] can call him something in without him making an appt? Please advise  06/07/17.

## 2017-06-08 ENCOUNTER — Ambulatory Visit: Payer: Commercial Managed Care - PPO | Admitting: Family Medicine

## 2017-06-09 ENCOUNTER — Encounter: Payer: Self-pay | Admitting: Family Medicine

## 2017-06-09 ENCOUNTER — Ambulatory Visit (INDEPENDENT_AMBULATORY_CARE_PROVIDER_SITE_OTHER): Payer: Commercial Managed Care - PPO | Admitting: Family Medicine

## 2017-06-09 VITALS — BP 122/90 | HR 91 | Temp 98.0°F | Ht 71.5 in | Wt 240.8 lb

## 2017-06-09 DIAGNOSIS — J45901 Unspecified asthma with (acute) exacerbation: Secondary | ICD-10-CM | POA: Diagnosis not present

## 2017-06-09 DIAGNOSIS — J988 Other specified respiratory disorders: Secondary | ICD-10-CM | POA: Diagnosis not present

## 2017-06-09 LAB — POCT RAPID STREP A (OFFICE): Rapid Strep A Screen: POSITIVE — AB

## 2017-06-09 MED ORDER — AMOXICILLIN-POT CLAVULANATE 875-125 MG PO TABS: 1.0000 | ORAL_TABLET | Freq: Two times a day (BID) | ORAL | 0 refills | Status: DC

## 2017-06-09 MED ORDER — DOXYCYCLINE HYCLATE 100 MG PO TABS: 100.0000 mg | ORAL_TABLET | Freq: Two times a day (BID) | ORAL | 0 refills | Status: DC

## 2017-06-09 MED ORDER — PREDNISONE 20 MG PO TABS: 40.0000 mg | ORAL_TABLET | Freq: Every day | ORAL | 0 refills | Status: DC

## 2017-06-09 NOTE — Progress Notes (Addendum)
HPI:  Acute visit for respiratory illness: -started:2 weeks ago -symptoms:nasal congestion, sore throat, cough, then had some asthma symptoms --> did a short taper prednisone from [facility] - helped but did not resolve symptoms, then worse the last few days with this PND discolored, sinus pressure, sore throat, cough, increased alb use yesterday, a little wheezing today - has not used alb today, chills today -denies:fever, SOB, NVD, tooth pain, body aches -has tried: see above -sick contacts/travel/risks: no reported flu, strep or tick exposure -Hx of: Allergies and allergic rhinitis, uses Flovent inconsistently and albuterol as needed  ROS: See pertinent positives and negatives per HPI.  Past Medical History:  Diagnosis Date  . Allergic rhinitis, seasonal   . History of adenomatous polyp of colon    03-25-2014  tubular adenoma's /  hyperplastic  . History of prostatitis   . Left ureteral stone   . Mild obstructive sleep apnea    per study 2014  . Moderate persistent asthma    followed by pcp-- uncomplicated  . Plantar fasciitis    left foot    Past Surgical History:  Procedure Laterality Date  . COLONOSCOPY  03/25/2014  . SHOULDER ARTHROSCOPY WITH ROTATOR CUFF REPAIR AND SUBACROMIAL DECOMPRESSION Left 07-24-2010  dr Noemi Chapel @  [facility]   and Labrum tear debridement    Family History  Problem Relation Age of Onset  . Diabetes Maternal Grandmother   . Colon cancer Maternal Grandmother 87  . Diabetes Mother   . Hepatitis C Mother   . Prostate cancer Father   . Diabetes Paternal Grandmother   . Mental illness Brother   . Mental illness Sister   . Mental illness Daughter   . Stomach cancer Maternal Grandfather   . COPD Other        Aunt  . Diabetes Other        siblings  . Pancreatic cancer Paternal Aunt   . Esophageal cancer Neg Hx   . Rectal cancer Neg Hx     Social History   Socioeconomic History  . Marital status: Married    Spouse name: None  . Number of  children: None  . Years of education: None  . Highest education level: None  Social Needs  . Financial resource strain: None  . Food insecurity - worry: None  . Food insecurity - inability: None  . Transportation needs - medical: None  . Transportation needs - non-medical: None  Occupational History  . Occupation: facility and Dealer: [employer]    Comment: Head maintenance at [employer]  . Smoking status: Former Smoker    Years: 3.00    Last attempt to quit: 07/12/1984    Years since quitting: 32.9  . Smokeless tobacco: Former Systems developer    Types: Snuff  . Tobacco comment: "EXPERIMENTED" W/SMOKING--NOT EVEN A PACK X 1 WEEK.   Substance and Sexual Activity  . Alcohol use: Yes    Comment: 1-2 drinks per week  . Drug use: No  . Sexual activity: None    Comment: vasectomy-- 2010  Other Topics Concern  . None  Social History Narrative   Wife is a Designer, jewellery at the [employer]     Current Outpatient Medications:  .  albuterol (PROVENTIL HFA;VENTOLIN HFA) 108 (90 Base) MCG/ACT inhaler, INHALE 2 PUFFS 4 TIMES A DAY, Disp: 8.5 Inhaler, Rfl: 5 .  cetirizine (ZYRTEC) 10 MG tablet, Take 10 mg by mouth every morning. , Disp: ,  Rfl:  .  cyclobenzaprine (FLEXERIL) 5 MG tablet, TAKE 1 TABLET BY MOUTH AT BEDTIME AS NEEDED FOR SPASMS, Disp: , Rfl: 0 .  doxycycline (VIBRA-TABS) 100 MG tablet, Take 1 tablet (100 mg total) by mouth 2 (two) times daily., Disp: 14 tablet, Rfl: 0 .  FLOVENT HFA 110 MCG/ACT inhaler, INHALE 1 PUFF INTO THE LUNGS 2 (TWO) TIMES DAILY (Patient taking differently: Inhale 1 puff into the lungs 2 (two) times daily. ), Disp: 12 g, Rfl: 5 .  fluticasone (FLONASE) 50 MCG/ACT nasal spray, PLACE 2 SPRAYS INTO BOTH NOSTRILS DAILY. (Patient taking differently: Place 2 sprays into both nostrils every morning. ), Disp: 16 g, Rfl: 11 .  Multiple Vitamins-Minerals (MEGA MULTIVITAMIN FOR MEN) TABS, Take 1 tablet by mouth daily., Disp: ,  Rfl:  .  omeprazole (PRILOSEC) 40 MG capsule, Take 1 capsule (40 mg total) by mouth daily. (Patient taking differently: Take 40 mg by mouth every morning. ), Disp: 90 capsule, Rfl: 3 .  predniSONE (DELTASONE) 20 MG tablet, Take 2 tablets (40 mg total) by mouth daily with breakfast., Disp: 6 tablet, Rfl: 0  EXAM:  Vitals:   06/09/17 1641  BP: 122/90  Pulse: 91  Temp: 98 F (36.7 C)  SpO2: 97%    Body mass index is 33.12 kg/m.  GENERAL: vitals reviewed and listed above, alert, oriented, appears well hydrated and in no acute distress  HEENT: atraumatic, conjunttiva clear, no obvious abnormalities on inspection of external nose and ears, normal appearance of ear canals and TMs, clear nasal congestion, thick post oropharyngeal erythema with PND, mild tonsillar edema with erythema or exudate, no sinus TTP  NECK: no obvious masses on inspection  LUNGS: clear to auscultation bilaterally, no wheezes, rales or rhonchi, good air movement  CV: HRRR, no peripheral edema  MS: moves all extremities without noticeable abnormality  PSYCH: pleasant and cooperative, no obvious depression or anxiety  ASSESSMENT AND PLAN:  Discussed the following assessment and plan:  Persistent asthma with acute exacerbation, unspecified asthma severity  Respiratory infection  -given HPI and exam findings today, a serious infection or illness is unlikely. We discussed potential etiologies,treatment side effects, likely course,transmission, and signs of developing a serious illness. -he complained of a very sore throat as I was leaving the room and he did have exudate L tonsil - strep test positive -I think he is recovering from [place] with asthma exacerbation and now is coming down with strep  -change abx choice to Augmentin to cover sinusitis and strep -alb as needed -follow up recheck next week -of course, we advised to return or notify a doctor immediately if symptoms worsen or persist or new concerns  arise.    Patient Instructions  Schedule follow-up with PCP next week  Take the antibiotic as prescribed  Use the prednisone per instructions  Use your inhalers per instructions  I hope you are feeling better soon! Seek care promptly if your symptoms worsen, new concerns arise or you are not improving with treatment.     Colin Benton R., DO

## 2017-06-09 NOTE — Patient Instructions (Signed)
Schedule follow-up with PCP next week  Take the antibiotic as prescribed  Use the prednisone per instructions  Use your inhalers per instructions  I hope you are feeling better soon! Seek care promptly if your symptoms worsen, new concerns arise or you are not improving with treatment.

## 2017-06-09 NOTE — Addendum Note (Signed)
Addended by: Agnes Lawrence on: 06/09/2017 05:06 PM   Modules accepted: Orders

## 2017-06-09 NOTE — Addendum Note (Signed)
Addended by: Agnes Lawrence on: 06/09/2017 05:02 PM   Modules accepted: Orders

## 2017-07-15 ENCOUNTER — Telehealth: Payer: Self-pay

## 2017-07-15 MED ORDER — PANTOPRAZOLE SODIUM 40 MG PO TBEC: 40.0000 mg | DELAYED_RELEASE_TABLET | Freq: Every day | ORAL | 3 refills | Status: DC

## 2017-07-15 NOTE — Telephone Encounter (Signed)
D/c omeprazole  Ok to start protonix 40 mg, 1 po daily. 90, 3 I think he will want 90 d supply

## 2017-07-15 NOTE — Telephone Encounter (Signed)
Copied from [place] [id number] [id number]. Topic: General - Other >> Jul 15, 2017  8:01 AM Carolyn Stare wrote:  Pt call to say the below med is not working and is asking if there is something else he can try    would like a call back   omeprazole (PRILOSEC) 40 MG capsule  Ph[facility]acy [facility] [address]

## 2017-07-15 NOTE — Telephone Encounter (Signed)
Clarence Alvarez notified as instructed by telephone.  [facility] [address] [facility] instructed by Dr. [name].  Medication list updated.

## 2017-08-14 ENCOUNTER — Emergency Department (HOSPITAL_COMMUNITY): Payer: Commercial Managed Care - PPO

## 2017-08-14 ENCOUNTER — Encounter (HOSPITAL_COMMUNITY): Payer: Self-pay | Admitting: *Deleted

## 2017-08-14 ENCOUNTER — Other Ambulatory Visit: Payer: Self-pay

## 2017-08-14 ENCOUNTER — Emergency Department (HOSPITAL_COMMUNITY)
Admission: EM | Admit: 2017-08-14 | Discharge: 2017-08-14 | Disposition: A | Discharge status: Home / Self Care | Payer: Commercial Managed Care - PPO | Attending: Emergency Medicine | Admitting: Emergency Medicine

## 2017-08-14 DIAGNOSIS — Z8719 Personal history of other diseases of the digestive system: Secondary | ICD-10-CM | POA: Insufficient documentation

## 2017-08-14 DIAGNOSIS — R05 Cough: Secondary | ICD-10-CM | POA: Diagnosis not present

## 2017-08-14 DIAGNOSIS — J4521 Mild intermittent asthma with (acute) exacerbation: Secondary | ICD-10-CM | POA: Insufficient documentation

## 2017-08-14 DIAGNOSIS — Z87891 Personal history of nicotine dependence: Secondary | ICD-10-CM | POA: Diagnosis not present

## 2017-08-14 DIAGNOSIS — R6889 Other general symptoms and signs: Secondary | ICD-10-CM | POA: Diagnosis present

## 2017-08-14 DIAGNOSIS — R079 Chest pain, unspecified: Secondary | ICD-10-CM | POA: Diagnosis not present

## 2017-08-14 DIAGNOSIS — Z79899 Other long term (current) drug therapy: Secondary | ICD-10-CM | POA: Diagnosis not present

## 2017-08-14 DIAGNOSIS — R0789 Other chest pain: Secondary | ICD-10-CM | POA: Diagnosis not present

## 2017-08-14 LAB — CBC
HCT: 46.8 % (ref 39.0–52.0)
Hemoglobin: 16 g/dL (ref 13.0–17.0)
MCH: 30.8 pg (ref 26.0–34.0)
MCHC: 34.2 g/dL (ref 30.0–36.0)
MCV: 90.2 fL (ref 78.0–100.0)
Platelets: 131 10*3/uL — ABNORMAL LOW (ref 150–400)
RBC: 5.19 MIL/uL (ref 4.22–5.81)
RDW: 13.2 % (ref 11.5–15.5)
WBC: 2.4 10*3/uL — ABNORMAL LOW (ref 4.0–10.5)

## 2017-08-14 LAB — BASIC METABOLIC PANEL
Anion gap: 13 (ref 5–15)
BUN: 12 mg/dL (ref 6–20)
CO2: 24 mmol/L (ref 22–32)
Calcium: 9 mg/dL (ref 8.9–10.3)
Chloride: 99 mmol/L — ABNORMAL LOW (ref 101–111)
Creatinine, Ser: 1.27 mg/dL — ABNORMAL HIGH (ref 0.61–1.24)
GFR calc Af Amer: >60 mL/min (ref >60)
GFR calc non Af Amer: >60 mL/min (ref >60)
Glucose, Bld: 127 mg/dL — ABNORMAL HIGH (ref 65–99)
Potassium: 4.1 mmol/L (ref 3.5–5.1)
Sodium: 136 mmol/L (ref 135–145)

## 2017-08-14 LAB — I-STAT TROPONIN, ED: Troponin i, poc: 0 ng/mL (ref 0.00–0.08)

## 2017-08-14 MED ORDER — ONDANSETRON HCL 4 MG/2ML IJ SOLN
4.0000 mg | Freq: Once | INTRAMUSCULAR | Status: AC
  Administered 2017-08-14: 4 mg via INTRAVENOUS
  Filled 2017-08-14: qty 2

## 2017-08-14 MED ORDER — PREDNISONE 10 MG PO TABS: 20.0000 mg | ORAL_TABLET | Freq: Two times a day (BID) | ORAL | 0 refills | Status: DC

## 2017-08-14 MED ORDER — SODIUM CHLORIDE 0.9 % IV BOLUS (SEPSIS)
1000.0000 mL | Freq: Once | INTRAVENOUS | Status: AC
  Administered 2017-08-14: 1000 mL via INTRAVENOUS

## 2017-08-14 MED ORDER — ALBUTEROL SULFATE (2.5 MG/3ML) 0.083% IN NEBU
5.0000 mg | INHALATION_SOLUTION | Freq: Once | RESPIRATORY_TRACT | Status: DC
  Filled 2017-08-14: qty 6

## 2017-08-14 MED ORDER — PREDNISONE 20 MG PO TABS
60.0000 mg | ORAL_TABLET | Freq: Once | ORAL | Status: AC
  Administered 2017-08-14: 60 mg via ORAL
  Filled 2017-08-14: qty 3

## 2017-08-14 MED ORDER — ONDANSETRON HCL 4 MG PO TABS: 4.0000 mg | ORAL_TABLET | Freq: Three times a day (TID) | ORAL | 0 refills | Status: DC | PRN

## 2017-08-14 MED ORDER — GI COCKTAIL ~~LOC~~
30.0000 mL | Freq: Once | ORAL | Status: AC
  Administered 2017-08-14: 30 mL via ORAL
  Filled 2017-08-14: qty 30

## 2017-08-14 MED ORDER — ALBUTEROL SULFATE (2.5 MG/3ML) 0.083% IN NEBU
2.5000 mg | INHALATION_SOLUTION | Freq: Once | RESPIRATORY_TRACT | Status: AC
  Administered 2017-08-14: 2.5 mg via RESPIRATORY_TRACT

## 2017-08-14 MED ORDER — SUCRALFATE 1 GM/10ML PO SUSP: 1.0000 g | Freq: Three times a day (TID) | ORAL | 0 refills | Status: DC

## 2017-08-14 MED ORDER — IPRATROPIUM-ALBUTEROL 0.5-2.5 (3) MG/3ML IN SOLN
3.0000 mL | Freq: Once | RESPIRATORY_TRACT | Status: AC
  Administered 2017-08-14: 3 mL via RESPIRATORY_TRACT
  Filled 2017-08-14: qty 3

## 2017-08-14 NOTE — Discharge Instructions (Addendum)
Please take medications as prescribed.  Follow up with PCP.  If you develop any chest pain with exertion, shortness of breath, nausea or pain goes to either arm, neck or jaw please return to the emergency department. If you develop worsening or new concerning symptoms you can return to the emergency department for re-evaluation.

## 2017-08-14 NOTE — ED Provider Notes (Signed)
[facility] Provider Note   CSN: [id number] Arrival date & time: 08/14/17  1229     History   Chief Complaint Chief Complaint  Patient presents with  . Chest Pain  . Asthma    HPI Clarence Alvarez is a 52 y.o. male with a history of asthma, allergic rhinitis who presents to the emergency department today for flulike symptoms, wheezing, shortness of breath and chest tightness.  Patient notes that on Sunday he was at church and believes that his pastor gave him the flu.  He notes that several people was at church have had similar symptoms.  He notes since Thursday he has had body aches, subjective fevers, chills, congestion, nonproductive cough, nonbilious, nonbloody emesis, non-melanous/nonbloody diarrhea.  Last episode of emesis and diarrhea over last night.  He notes that he has been taking over-the-counter Mucinex for symptoms with mild relief.  He has also been trying home remedies such as whiskey plus honey with no relief.  Yesterday he began having what he believes is an asthma flare.  He notes that he has had chest tightness and wheezing.  He has had 2 puffs of albuterol today with no relief of his symptoms.  He is concerned that he has pneumonia which is why he presented here today.  Patient denies any associated neck stiffness, rash, abdominal pain, lower leg swelling.  Patient reports over the last 2 years she has had "heartburn".  He states this is always been the same.  He notes as a burning in his chest that typically occurs after eating.  He has been prescribed pantoprazole for this by his PCP.  He notes that since taking meloxicam he has had increased burning in his chest as well as burping and belching.  He has been taking Tums on top of his prescribed pantoprazole without relief.  He describes his pain as a burning in the center of his chest typically occurs after eating.  The pain does not radiate and is not associated with shortness of breath  or diaphoresis.  Patient is a former smoker.  No family history of cardiac disease.  HPI  Past Medical History:  Diagnosis Date  . Allergic rhinitis, seasonal   . History of adenomatous polyp of colon    03-25-2014  tubular adenoma's /  hyperplastic  . History of prostatitis   . Left ureteral stone   . Mild obstructive sleep apnea    per study 2014  . Moderate persistent asthma    followed by pcp-- uncomplicated  . Plantar fasciitis    left foot    Patient Active Problem List   Diagnosis Date Noted  . Lumbar pain with radiation down right leg 12/23/2016  . OSA (obstructive sleep apnea) 01/26/2013  . ALLERGIC RHINITIS, SEASONAL 09/15/2009  . Asthma 09/08/2006    Past Surgical History:  Procedure Laterality Date  . COLONOSCOPY  03/25/2014  . SHOULDER ARTHROSCOPY WITH ROTATOR CUFF REPAIR AND SUBACROMIAL DECOMPRESSION Left 07-24-2010  dr Noemi Chapel @  [facility]   and Labrum tear debridement       Home Medications    Prior to Admission medications   Medication Sig Start Date End Date Taking? Authorizing Provider  albuterol (PROVENTIL HFA;VENTOLIN HFA) 108 (90 Base) MCG/ACT inhaler INHALE 2 PUFFS 4 TIMES A DAY 05/19/17   Copland, Frederico Hamman, MD  amoxicillin-clavulanate (AUGMENTIN) 875-125 MG tablet Take 1 tablet by mouth 2 (two) times daily. 06/09/17   Lucretia Kern, DO  cetirizine (ZYRTEC) 10 MG tablet  Take 10 mg by mouth every morning.     [provider]  cyclobenzaprine (FLEXERIL) 5 MG tablet TAKE 1 TABLET BY MOUTH AT BEDTIME AS NEEDED FOR SPASMS 07/23/16   [provider]  FLOVENT HFA 110 MCG/ACT inhaler INHALE 1 PUFF INTO THE LUNGS 2 (TWO) TIMES DAILY Patient taking differently: Inhale 1 puff into the lungs 2 (two) times daily.  03/04/17   Copland, Frederico Hamman, MD  fluticasone (FLONASE) 50 MCG/ACT nasal spray PLACE 2 SPRAYS INTO BOTH NOSTRILS DAILY. Patient taking differently: Place 2 sprays into both nostrils every morning.  08/02/16   Copland, Frederico Hamman, MD  Multiple  Vitamins-Minerals (MEGA MULTIVITAMIN FOR MEN) TABS Take 1 tablet by mouth daily.    [provider]  pantoprazole (PROTONIX) 40 MG tablet Take 1 tablet (40 mg total) by mouth daily. 07/15/17   Copland, Frederico Hamman, MD  albuterol (PROVENTIL,VENTOLIN) 90 MCG/ACT inhaler Inhale 2 puffs into the lungs every 6 (six) hours as needed for wheezing. 02/15/11 09/29/11  Katherina Mires, MD    Family History Family History  Problem Relation Age of Onset  . Diabetes Maternal Grandmother   . Colon cancer Maternal Grandmother 87  . Diabetes Mother   . Hepatitis C Mother   . Prostate cancer Father   . Diabetes Paternal Grandmother   . Mental illness Brother   . Mental illness Sister   . Mental illness Daughter   . Stomach cancer Maternal Grandfather   . COPD Other        Aunt  . Diabetes Other        siblings  . Pancreatic cancer Paternal Aunt   . Esophageal cancer Neg Hx   . Rectal cancer Neg Hx     Social History Social History   Tobacco Use  . Smoking status: Former Smoker    Years: 3.00    Last attempt to quit: 07/12/1984    Years since quitting: 33.1  . Smokeless tobacco: Former Systems developer    Types: Snuff  . Tobacco comment: "EXPERIMENTED" W/SMOKING--NOT EVEN A PACK X 1 WEEK.   Substance Use Topics  . Alcohol use: Yes    Comment: 1-2 drinks per week  . Drug use: No     Allergies   Patient has no known allergies.   Review of Systems Review of Systems  All other systems reviewed and are negative.    Physical Exam Updated Vital Signs BP (!) 151/93   Pulse 87   Temp 98.8 F (37.1 C) (Oral)   Resp (!) 24   Ht 6' (1.829 m)   Wt 106.6 kg (235 lb)   SpO2 96%   BMI 31.87 kg/m   Physical Exam  Constitutional: He appears well-developed and well-nourished.  HENT:  Head: Normocephalic and atraumatic.  Right Ear: Tympanic membrane, external ear and ear canal normal.  Left Ear: Tympanic membrane, external ear and ear canal normal.  Nose: Mucosal edema present. Right sinus  exhibits no maxillary sinus tenderness and no frontal sinus tenderness. Left sinus exhibits no maxillary sinus tenderness and no frontal sinus tenderness.  Mouth/Throat: Uvula is midline, oropharynx is clear and moist and mucous membranes are normal. No tonsillar exudate.  The patient has normal phonation and is in control of secretions. No stridor.  Midline uvula without edema. Soft palate rises symmetrically.  No tonsillar erythema or exudates. No PTA. Tongue protrusion is normal. No trismus. No creptius on neck palpation and patient has good dentition. No gingival erythema or fluctuance noted. Mucus membranes moist.  Eyes: Pupils are equal, round, and reactive to light. Right eye exhibits no discharge. Left eye exhibits no discharge. No scleral icterus.  Neck: Trachea normal. Neck supple. No spinous process tenderness present. No neck rigidity. Normal range of motion present.  No nuchal rigidity or meningismus  Cardiovascular: Normal rate, regular rhythm and intact distal pulses.  No murmur heard. Pulses:      Radial pulses are 2+ on the right side, and 2+ on the left side.       Dorsalis pedis pulses are 2+ on the right side, and 2+ on the left side.       Posterior tibial pulses are 2+ on the right side, and 2+ on the left side.  No lower extremity swelling or edema. Calves symmetric in size bilaterally.  Pulmonary/Chest: Effort normal. He has wheezes (end exp). He exhibits no tenderness.  No increased work of breathing. No accessory muscle use. Patient is sitting upright, speaking in full sentences without difficulty   Abdominal: Soft. Bowel sounds are normal. There is no tenderness. There is no rebound and no guarding.  Musculoskeletal: He exhibits no edema.  Lymphadenopathy:    He has no cervical adenopathy.  Neurological: He is alert.  Speech clear. Follows commands. No facial droop. PERRLA. EOM grossly intact. CN III-XII grossly intact. Grossly moves all extremities 4 without ataxia.  Able and appropriate strength for age to upper and lower extremities bilaterally. Normal finger to nose bilaterally.   Skin: Skin is warm and dry. No rash noted. He is not diaphoretic.  No petechiae or purpura  Psychiatric: He has a normal mood and affect.  Nursing note and vitals reviewed.    ED Treatments / Results  Labs (all labs ordered are listed, but only abnormal results are displayed) Labs Reviewed  BASIC METABOLIC PANEL - Abnormal; Notable for the following components:      Result Value   Chloride 99 (*)    Glucose, Bld 127 (*)    Creatinine, Ser 1.27 (*)    All other components within normal limits  CBC - Abnormal; Notable for the following components:   WBC 2.4 (*)    Platelets 131 (*)    All other components within normal limits  I-STAT TROPONIN, ED    EKG  EKG Interpretation  Date/Time:  Sunday August 14 2017 12:36:48 EST Ventricular Rate:  86 PR Interval:  144 QRS Duration: 84 QT Interval:  372 QTC Calculation: 445 R Axis:   61 Text Interpretation:  Normal sinus rhythm Normal ECG No significant change since last tracing Confirmed by Duffy Bruce [phone]) on 08/14/2017 3:31:31 PM       Radiology Dg Chest 2 View  Result Date: 08/14/2017 CLINICAL DATA:  Cough, chest pain, dizziness and weakness. EXAM: CHEST  2 VIEW COMPARISON:  None. FINDINGS: Normal sized heart. Clear lungs. Minimal peribronchial thickening. Thoracic spine degenerative changes and cervical spine fixation hardware. IMPRESSION: Minimal bronchitic changes. Electronically Signed   By: Claudie Revering M.D.   On: 08/14/2017 13:36    Procedures Procedures (including critical care time)  Medications Ordered in ED Medications  albuterol (PROVENTIL) (2.5 MG/3ML) 0.083% nebulizer solution 5 mg (not administered)  albuterol (PROVENTIL) (2.5 MG/3ML) 0.083% nebulizer solution 2.5 mg (2.5 mg Nebulization Given 08/14/17 1246)     Initial Impression / Assessment and Plan / ED Course  I have reviewed  the triage vital signs and the nursing notes.  Pertinent labs & imaging results that were available during my care of the patient were  reviewed by me and considered in my medical decision making (see chart for details).     52 year old male here with multiple complaints.  Patient reports flulike symptoms since last Thursday including subjective fevers, body aches, chills, congestion, nonproductive cough, emesis and diarrhea.  Patient has several sick contacts at church.  He is also reporting asthma exacerbation with chest tightness, wheezing not relieved by albuterol inhaler at home.  Presented today because he is concerned he is developing pneumonia.  On presentation the patient's vital signs are reassuring.  He is without fever or hypoxia.  On exam the patient is noted to have URI symptoms.  There is mild diffuse end expiratory wheezing.  Patient is sitting upright speaking full sentences without difficulty.  Chest x-ray shows mild bronchiectatic changes.  No infiltrates, consolidation or edema.  After albuterol treatment the patient feels his breathing is back to baseline.  Wheezing has resolved.  He was given steroids in the department.  Patient was also given IV fluids and nausea medication with relief.  He has been without emesis in the department is tolerating p.o. fluids.  No abdominal pain or peritoneal signs.  Labs are reassuring.  Mild elevation of creatinine likely due to his dehydration. Suspect patient has the flu but is outside the range for Tamiflu tx. Patient given steroids in the department for asthma flare. Will be discharged on steroids burst. Patient has albuterol inhaler at home.   Patient also complaining of increased burning of his chest that he says is consistent with his heartburn.  He notes this is increased since starting meloxicam.  Patient says he has had increased burping and belching associated with this.  He is Artie prescribed pantoprazole for this.  He has been also  taking Tums without relief.  Denies radiation of the pain or associated shortness of breath, nausea, diaphoresis.  Troponin done in triage 0.00.  EKG sinus rhythm.  No ischemic changes.  QT within normal limits.  Heart score 2.  Do not suspect ACS at this time.  Suspect symptoms are likely related to gastritis from meloxicam use.  Patient given GI cocktail in department with relief of symptoms.  Will send home on Carafate, advised continuing pantoprazole and stopping meloxicam.  He is to use Tylenol for pain.   I advised the patient to follow-up with PCP this week. Specific return precautions discussed. Time was given for all questions to be answered. The patient verbalized understanding and agreement with plan. The patient appears safe for discharge home.  Final Clinical Impressions(s) / ED Diagnoses   Final diagnoses:  Flu-like symptoms  Mild intermittent asthma with exacerbation  Atypical chest pain  History of gastroesophageal reflux (GERD)    ED Discharge Orders        Ordered    ondansetron (ZOFRAN) 4 MG tablet  Every 8 hours PRN     08/14/17 1539    sucralfate (CARAFATE) 1 GM/10ML suspension  3 times daily with meals & bedtime     08/14/17 1539    predniSONE (DELTASONE) 10 MG tablet  2 times daily with meals     08/14/17 1539       Lorelle Gibbs 08/14/17 1540    Tanna Furry, MD 08/15/17 2207

## 2017-08-14 NOTE — ED Notes (Signed)
Signature pad inop. Pt briefed on medications and f/u care as described in DC paperwork. No questions asked prior to d/c. Pt ambulated to lobby without assistance.

## 2017-08-26 ENCOUNTER — Ambulatory Visit: Payer: Self-pay

## 2017-08-26 ENCOUNTER — Ambulatory Visit: Payer: Self-pay | Admitting: Nurse Practitioner

## 2017-08-26 NOTE — Telephone Encounter (Signed)
Patient called in with c/o "cough and congestion." He says "it all started on 08/10/17 I had the flu. I went to the emergency room on 08/14/17 and got the full workup done and was told I had the flu, but it was too far out to prescribe the Tamiflu. I got breathing treatments and all to help my breathing. I didn't follow up like I was suggested. Now, the cough is still here, I'm not having fevers. When I exert myself, the cough is worse and I get short of breath, take my rescue inhaler, and rest." I asked what is he taking, he says "mucinex, cough medicine all over the counter stuff and nothing is stopping the cough. It wakes me up at nigh coughing." I asked if the cough is productive, he says "yes, greenish-yellow thick mucus. I also blow the same out my nose and blood comes out my nose, but not when I cough." He reports wheezing when he exerts himself, but otherwise no wheezing or chest pain. According to protocol, see PCP within 24 hours, no available appointment with provider or practice, he agrees to go to another location, appointment made today at the [facility] at 2 pm with Wilfred Lacy, NP, care advice given, he verbalized understanding.  Reason for Disposition . [1] Continuous (nonstop) coughing interferes with work or school AND [2] no improvement using cough treatment per Care Advice  Answer Assessment - Initial Assessment Questions 1. ONSET: "When did the cough begin?"      08/10/17 2. SEVERITY: "How bad is the cough today?"      Constant coughing with exerting 3. RESPIRATORY DISTRESS: "Describe your breathing."      As long as I don't exert myself, it's manageable; when I exert myself, I get winded real quick and I have to use my rescue inhaler 4. FEVER: "Do you have a fever?" If so, ask: "What is your temperature, how was it measured, and when did it start?"     No 5. SPUTUM: "Describe the color of your sputum" (clear, white, yellow, green)     Greenish, yellow, thick since  08/10/17 6. HEMOPTYSIS: "Are you coughing up any blood?" If so ask: "How much?" (flecks, streaks, tablespoons, etc.)     No 7. CARDIAC HISTORY: "Do you have any history of heart disease?" (e.g., heart attack, congestive heart failure)      No 8. LUNG HISTORY: "Do you have any history of lung disease?"  (e.g., pulmonary embolus, asthma, emphysema)     Asthma 9. PE RISK FACTORS: "Do you have a history of blood clots?" (or: recent major surgery, recent prolonged travel, bedridden )     No 10. OTHER SYMPTOMS: "Do you have any other symptoms?" (e.g., runny nose, wheezing, chest pain)       Nasal congestion-greenish, yellow, runny nose, wheezing with exertion 11. PREGNANCY: "Is there any chance you are pregnant?" "When was your last menstrual period?"       N/A 12. TRAVEL: "Have you traveled out of the country in the last month?" (e.g., travel history, exposures)       No  Protocols used: [place]

## 2017-08-26 NOTE — Telephone Encounter (Signed)
FYI

## 2017-08-29 ENCOUNTER — Other Ambulatory Visit: Payer: Self-pay

## 2017-08-29 ENCOUNTER — Ambulatory Visit: Payer: Self-pay | Admitting: Family Medicine

## 2017-08-29 ENCOUNTER — Encounter: Payer: Self-pay | Admitting: Family Medicine

## 2017-08-29 ENCOUNTER — Ambulatory Visit: Payer: Commercial Managed Care - PPO | Admitting: Family Medicine

## 2017-08-29 VITALS — BP 124/84 | HR 83 | Temp 98.5°F | Ht 71.5 in | Wt 252.0 lb

## 2017-08-29 DIAGNOSIS — L049 Acute lymphadenitis, unspecified: Secondary | ICD-10-CM | POA: Diagnosis not present

## 2017-08-29 DIAGNOSIS — R590 Localized enlarged lymph nodes: Secondary | ICD-10-CM | POA: Diagnosis not present

## 2017-08-29 DIAGNOSIS — J4541 Moderate persistent asthma with (acute) exacerbation: Secondary | ICD-10-CM

## 2017-08-29 MED ORDER — PREDNISONE 20 MG PO TABS: ORAL_TABLET | ORAL | 0 refills | Status: DC

## 2017-08-29 NOTE — Progress Notes (Signed)
Dr. Frederico Hamman T. Ambermarie Honeyman, MD, [facility] [place], [address] Phone: [phone] Fax: [phone]  08/29/2017  Patient: Clarence Alvarez, MRN: [id number], DOB: Feb 04, 1966, 52 y.o.  Primary Physician:  Owens Loffler, MD   Chief Complaint  Patient presents with  . Follow-up    ED Visit 08/14/17-GERD/FLU/ASTHMA   Subjective:   Clarence Alvarez is a 52 y.o. very pleasant male patient who presents with the following:  Still with a lot of coughing and mucous. Friend who has a cat.  Coughing when went to visit the friend with a cat. Mucous will not go away.  ER 08/14/2017 dx with flu and asthma exac Also stopped his flovent.   Currently on doxy  Sister died suddenly in car crash  Past Medical History, Surgical History, Social History, Family History, Problem List, Medications, and Allergies have been reviewed and updated if relevant.  Patient Active Problem List   Diagnosis Date Noted  . Lumbar pain with radiation down right leg 12/23/2016  . OSA (obstructive sleep apnea) 01/26/2013  . ALLERGIC RHINITIS, SEASONAL 09/15/2009  . Asthma 09/08/2006    Past Medical History:  Diagnosis Date  . Allergic rhinitis, seasonal   . History of adenomatous polyp of colon    03-25-2014  tubular adenoma's /  hyperplastic  . History of prostatitis   . Left ureteral stone   . Mild obstructive sleep apnea    per study 2014  . Moderate persistent asthma    followed by pcp-- uncomplicated  . Plantar fasciitis    left foot    Past Surgical History:  Procedure Laterality Date  . COLONOSCOPY  03/25/2014  . SHOULDER ARTHROSCOPY WITH ROTATOR CUFF REPAIR AND SUBACROMIAL DECOMPRESSION Left 07-24-2010  dr Noemi Chapel @  [facility]   and Labrum tear debridement    Social History   Socioeconomic History  . Marital status: Married    Spouse name: Not on file  . Number of children: Not on file  . Years of education: Not on file  . Highest  education level: Not on file  Social Needs  . Financial resource strain: Not on file  . Food insecurity - worry: Not on file  . Food insecurity - inability: Not on file  . Transportation needs - medical: Not on file  . Transportation needs - non-medical: Not on file  Occupational History  . Occupation: facility and Dealer: [employer]    Comment: Head maintenance at [employer]  . Smoking status: Former Smoker    Years: 3.00    Last attempt to quit: 07/12/1984    Years since quitting: 33.1  . Smokeless tobacco: Former Systems developer    Types: Snuff  . Tobacco comment: "EXPERIMENTED" W/SMOKING--NOT EVEN A PACK X 1 WEEK.   Substance and Sexual Activity  . Alcohol use: Yes    Comment: 1-2 drinks per week  . Drug use: No  . Sexual activity: Not on file    Comment: vasectomy-- 2010  Other Topics Concern  . Not on file  Social History Narrative   Wife is a Designer, jewellery at the [employer]    Family History  Problem Relation Age of Onset  . Diabetes Maternal Grandmother   . Colon cancer Maternal Grandmother 87  . Diabetes Mother   . Hepatitis C Mother   . Prostate cancer Father   . Diabetes Paternal Grandmother   . Mental illness Brother   .  Mental illness Sister   . Mental illness Daughter   . Stomach cancer Maternal Grandfather   . COPD Other        Aunt  . Diabetes Other        siblings  . Pancreatic cancer Paternal Aunt   . Esophageal cancer Neg Hx   . Rectal cancer Neg Hx     No Known Allergies  Medication list reviewed and updated in full in [place].  ROS: GEN: Acute illness details above GI: Tolerating PO intake GU: maintaining adequate hydration and urination Pulm: No SOB Interactive and getting along well at home.  Otherwise, ROS is as per the HPI.  Objective:   BP 124/84   Pulse 83   Temp 98.5 F (36.9 C) (Oral)   Ht 5' 11.5" (1.816 m)   Wt 252 lb (114.3 kg)   SpO2 93%   BMI 34.66 kg/m     GEN: A and O x 3. WDWN. NAD.    ENT: Nose clear, ext NML.  No LAD.  No JVD.  TM's clear. Oropharynx clear.  PULM: Normal WOB, no distress. No crackles, wheezes, rhonchi. CV: RRR, no M/G/R, No rubs, No JVD.   EXT: warm and well-perfused, No c/c/e. PSYCH: Pleasant and conversant.    Laboratory and Imaging Data: CLINICAL DATA:  Two week history of worsening left flank pain radiating to the groin with hematuria and dysuria.  EXAM: CT ABDOMEN AND PELVIS WITHOUT CONTRAST  TECHNIQUE: Multidetector CT imaging of the abdomen and pelvis was performed following the standard protocol without IV contrast.  COMPARISON:  CT abdomen and pelvis dated January 18, 2014.  FINDINGS: Lower chest: No acute abnormality.  Hepatobiliary: No focal liver abnormality is seen. No gallstones, gallbladder wall thickening, or biliary dilatation.  Pancreas: Unremarkable. No pancreatic ductal dilatation or surrounding inflammatory changes.  Spleen: Normal in size without focal abnormality.  Adrenals/Urinary Tract: The adrenal glands are unremarkable. There is a 6 mm calculus in the left mid ureter with mild proximal left hydroureteronephrosis. No focal renal lesion. Bladder is unremarkable.  Stomach/Bowel: Stomach is within normal limits. Appendix appears normal. No evidence of bowel wall thickening, distention, or inflammatory changes.  Vascular/Lymphatic: Minimal atherosclerotic vascular calcification. In the left mid abdomen, there are multiple prominent mesenteric lymph nodes measuring up to 13 mm in short axis, with associated mild inflammatory stranding of the mesentery, new from prior study.  Reproductive: Borderline prostatomegaly.  Other: Small fat containing umbilical hernia.  No free fluid.  Musculoskeletal: No acute or significant osseous findings.  IMPRESSION: 1. 6 mm calculus in the left mid ureter with resultant mild proximal left hydroureteronephrosis. 2. Multiple  prominent mesenteric lymph nodes in the left mid abdomen measuring up to 13 mm in short axis, with associated mild inflammatory stranding of the mesentery. Findings are nonspecific and can be idiopathic, seen with sclerosing mesenteritis, or mesenteric lymphoma. Recommend follow-up CT abdomen and pelvis with contrast in 3 months to evaluate for interval change.   Electronically Signed   By: Titus Dubin M.D.   On: 03/08/2017 14:55  Assessment and Plan:   Mesenteric lymphadenopathy - Plan: CT Abdomen Pelvis W Contrast  Acute lymphadenitis - Plan: CT Abdomen Pelvis W Contrast  Moderate persistent asthma with exacerbation  Persistent asthma, 93% 02 now. Pred now, restart flovent.  Given mesenteric lymph nodes described above, obtain CT abdomen and pelvis with contrast to eval for potential lymphoma.   Follow-up: No Follow-up on file.  Meds ordered this encounter  Medications  .  predniSONE (DELTASONE) 20 MG tablet    Sig: 2 tabs po for 5 days, then 1 tab for 3 days    Dispense:  13 tablet    Refill:  0   Medications Discontinued During This Encounter  Medication Reason  . amoxicillin-clavulanate (AUGMENTIN) 875-125 MG tablet Completed Course  . meloxicam (MOBIC) 15 MG tablet Discontinued by provider  . ondansetron (ZOFRAN) 4 MG tablet Completed Course  . predniSONE (DELTASONE) 10 MG tablet Completed Course   Orders Placed This Encounter  Procedures  . CT Abdomen Pelvis W Contrast    Signed,  Yuleni Burich T. Serinity Ware, MD   Allergies as of 08/29/2017   No Known Allergies     Medication List        Accurate as of 08/29/17  9:25 AM. Always use your most recent med list.          albuterol 108 (90 Base) MCG/ACT inhaler Commonly known as:  PROVENTIL HFA;VENTOLIN HFA INHALE 2 PUFFS 4 TIMES A DAY   cetirizine 10 MG tablet Commonly known as:  ZYRTEC Take 10 mg by mouth every morning.   dextromethorphan-guaiFENesin 30-600 MG 12hr tablet Commonly known as:   MUCINEX DM Take 1 tablet by mouth 2 (two) times daily.   doxycycline 100 MG capsule Commonly known as:  VIBRAMYCIN Take 100 mg by mouth 2 (two) times daily.   FLOVENT HFA 110 MCG/ACT inhaler Generic drug:  fluticasone INHALE 1 PUFF INTO THE LUNGS 2 (TWO) TIMES DAILY   fluticasone 50 MCG/ACT nasal spray Commonly known as:  FLONASE PLACE 2 SPRAYS INTO BOTH NOSTRILS DAILY.   MEGA MULTIVITAMIN FOR MEN Tabs Take 1 tablet by mouth daily.   pantoprazole 40 MG tablet Commonly known as:  PROTONIX Take 1 tablet (40 mg total) by mouth daily.   predniSONE 20 MG tablet Commonly known as:  DELTASONE 2 tabs po for 5 days, then 1 tab for 3 days   sucralfate 1 GM/10ML suspension Commonly known as:  CARAFATE Take 10 mLs (1 g total) by mouth 4 (four) times daily -  with meals and at bedtime.

## 2017-08-29 NOTE — Patient Instructions (Signed)

## 2017-09-08 ENCOUNTER — Ambulatory Visit (INDEPENDENT_AMBULATORY_CARE_PROVIDER_SITE_OTHER)
Admission: RE | Admit: 2017-09-08 | Discharge: 2017-09-08 | Disposition: A | Discharge status: Home / Self Care | Payer: Commercial Managed Care - PPO | Source: Ambulatory Visit | Attending: Family Medicine | Admitting: Family Medicine

## 2017-09-08 DIAGNOSIS — I880 Nonspecific mesenteric lymphadenitis: Secondary | ICD-10-CM | POA: Diagnosis not present

## 2017-09-08 DIAGNOSIS — L049 Acute lymphadenitis, unspecified: Secondary | ICD-10-CM

## 2017-09-08 DIAGNOSIS — R590 Localized enlarged lymph nodes: Secondary | ICD-10-CM

## 2017-09-08 MED ORDER — IOPAMIDOL (ISOVUE-300) INJECTION 61%
100.0000 mL | Freq: Once | INTRAVENOUS | Status: AC | PRN
  Administered 2017-09-08: 100 mL via INTRAVENOUS

## 2017-09-13 ENCOUNTER — Telehealth: Payer: Self-pay

## 2017-09-13 NOTE — Telephone Encounter (Signed)
Copied from [place] [id number]. Topic: Quick Communication - Office Called Patient >> Sep 12, 2017 10:42 AM Carter Kitten, CMA wrote: Reason for CRM: Need to find out if patient wants referral to GI when he calls back.  OK for [facility] to get information from patient. >> Sep 13, 2017 11:30 AM Aurelio Brash B wrote: PT called back and said he needs to take care of some of his current medical bills and will call back at a later date to request the GI referral.

## 2017-09-13 NOTE — Telephone Encounter (Signed)
Noted.  Dr. [name] notified on result note from CT 09/08/2017.

## 2017-12-06 ENCOUNTER — Other Ambulatory Visit (INDEPENDENT_AMBULATORY_CARE_PROVIDER_SITE_OTHER): Payer: Commercial Managed Care - PPO

## 2017-12-06 ENCOUNTER — Other Ambulatory Visit: Payer: Self-pay | Admitting: Family Medicine

## 2017-12-06 DIAGNOSIS — Z13220 Encounter for screening for lipoid disorders: Secondary | ICD-10-CM

## 2017-12-06 DIAGNOSIS — Z79899 Other long term (current) drug therapy: Secondary | ICD-10-CM

## 2017-12-06 DIAGNOSIS — Z125 Encounter for screening for malignant neoplasm of prostate: Secondary | ICD-10-CM

## 2017-12-06 LAB — LIPID PANEL
Cholesterol: 205 mg/dL — ABNORMAL HIGH (ref 0–200)
HDL: 46.4 mg/dL (ref >39.00)
LDL Cholesterol: 126 mg/dL — ABNORMAL HIGH (ref 0–99)
NonHDL: 158.93
Total CHOL/HDL Ratio: 4
Triglycerides: 165 mg/dL — ABNORMAL HIGH (ref 0.0–149.0)
VLDL: 33 mg/dL (ref 0.0–40.0)

## 2017-12-06 LAB — CBC WITH DIFFERENTIAL/PLATELET
Basophils Absolute: 0 10*3/uL (ref 0.0–0.1)
Basophils Relative: 0.8 % (ref 0.0–3.0)
Eosinophils Absolute: 0.2 10*3/uL (ref 0.0–0.7)
Eosinophils Relative: 6.5 % — ABNORMAL HIGH (ref 0.0–5.0)
HCT: 47.8 % (ref 39.0–52.0)
Hemoglobin: 16.4 g/dL (ref 13.0–17.0)
Lymphocytes Relative: 52.6 % — ABNORMAL HIGH (ref 12.0–46.0)
Lymphs Abs: 1.5 10*3/uL (ref 0.7–4.0)
MCHC: 34.2 g/dL (ref 30.0–36.0)
MCV: 92.3 fl (ref 78.0–100.0)
Monocytes Absolute: 0.3 10*3/uL (ref 0.1–1.0)
Monocytes Relative: 10.4 % (ref 3.0–12.0)
Neutro Abs: 0.8 10*3/uL — ABNORMAL LOW (ref 1.4–7.7)
Neutrophils Relative %: 29.7 % — ABNORMAL LOW (ref 43.0–77.0)
Platelets: 142 10*3/uL — ABNORMAL LOW (ref 150.0–400.0)
RBC: 5.18 Mil/uL (ref 4.22–5.81)
RDW: 13 % (ref 11.5–15.5)
WBC: 2.8 10*3/uL — ABNORMAL LOW (ref 4.0–10.5)

## 2017-12-06 LAB — BASIC METABOLIC PANEL
BUN: 18 mg/dL (ref 6–23)
CO2: 32 mEq/L (ref 19–32)
Calcium: 9.7 mg/dL (ref 8.4–10.5)
Chloride: 101 mEq/L (ref 96–112)
Creatinine, Ser: 1.21 mg/dL (ref 0.40–1.50)
GFR: 80.53 mL/min (ref >60.00)
Glucose, Bld: 107 mg/dL — ABNORMAL HIGH (ref 70–99)
Potassium: 4.5 mEq/L (ref 3.5–5.1)
Sodium: 140 mEq/L (ref 135–145)

## 2017-12-06 LAB — HEPATIC FUNCTION PANEL
ALT: 41 U/L (ref 0–53)
AST: 54 U/L — ABNORMAL HIGH (ref 0–37)
Albumin: 4.4 g/dL (ref 3.5–5.2)
Alkaline Phosphatase: 64 U/L (ref 39–117)
Bilirubin, Direct: 0.2 mg/dL (ref 0.0–0.3)
Total Bilirubin: 0.7 mg/dL (ref 0.2–1.2)
Total Protein: 7.5 g/dL (ref 6.0–8.3)

## 2017-12-06 LAB — PSA: PSA: 0.37 ng/mL (ref 0.10–4.00)

## 2017-12-12 ENCOUNTER — Other Ambulatory Visit: Payer: Self-pay

## 2017-12-12 ENCOUNTER — Ambulatory Visit (INDEPENDENT_AMBULATORY_CARE_PROVIDER_SITE_OTHER): Payer: Commercial Managed Care - PPO | Admitting: Family Medicine

## 2017-12-12 ENCOUNTER — Encounter: Payer: Self-pay | Admitting: Family Medicine

## 2017-12-12 VITALS — BP 140/90 | HR 76 | Temp 97.5°F | Ht 71.5 in | Wt 247.5 lb

## 2017-12-12 DIAGNOSIS — Z23 Encounter for immunization: Secondary | ICD-10-CM | POA: Diagnosis not present

## 2017-12-12 DIAGNOSIS — Z0000 Encounter for general adult medical examination without abnormal findings: Secondary | ICD-10-CM | POA: Diagnosis not present

## 2017-12-12 NOTE — Progress Notes (Signed)
Dr. Frederico Hamman T. Darenda Fike, MD, [facility] Medicine [place], [address] Phone: [phone] Fax: [phone]  12/12/2017  Patient: Clarence Alvarez, MRN: [id number], DOB: [birth year], 52 y.o.  Primary Physician:  Owens Loffler, MD   Chief Complaint  Patient presents with  . Annual Exam   Subjective:   Clarence Alvarez is a 52 y.o. pleasant patient who presents with the following:  Preventative Health Maintenance Visit:  Health Maintenance Summary Reviewed and updated, unless pt declines services.  Tobacco History Reviewed. Alcohol: No concerns, no excessive use Exercise Habits: Some activity, rec at least 30 mins 5 times a week STD concerns: no risk or activity to increase risk Drug Use: None Encouraged self-testicular check  Tdap?  Doing well, buying a house, divorce is finalized from wife.   Health Maintenance  Topic Date Due  . INFLUENZA VACCINE  02/09/2018  . COLONOSCOPY  03/26/2019  . TETANUS/TDAP  12/13/2027  . Hepatitis C Screening  Completed  . HIV Screening  Completed   Immunization History  Administered Date(s) Administered  . Influenza Whole 04/11/2012  . Influenza-Unspecified 03/12/2016, 04/10/2017  . Td 05/12/2004  . Tdap 12/12/2017   Patient Active Problem List   Diagnosis Date Noted  . Lumbar pain with radiation down right leg 12/23/2016  . OSA (obstructive sleep apnea) 01/26/2013  . ALLERGIC RHINITIS, SEASONAL 09/15/2009  . Asthma 09/08/2006   Past Medical History:  Diagnosis Date  . Allergic rhinitis, seasonal   . History of adenomatous polyp of colon    03-25-2014  tubular adenoma's /  hyperplastic  . History of prostatitis   . Left ureteral stone   . Mild obstructive sleep apnea    per study 2014  . Moderate persistent asthma    followed by pcp-- uncomplicated   Past Surgical History:  Procedure Laterality Date  . COLONOSCOPY  03/25/2014  . SHOULDER ARTHROSCOPY WITH ROTATOR CUFF  REPAIR AND SUBACROMIAL DECOMPRESSION Left 07-24-2010  dr Noemi Chapel @  [facility]   and Labrum tear debridement   Social History   Socioeconomic History  . Marital status: Divorced    Spouse name: Not on file  . Number of children: Not on file  . Years of education: Not on file  . Highest education level: Not on file  Occupational History  . 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 Financial resource strain: Not on file  . Food insecurity:    Worry: Not on file    Inability: Not on file  . Transportation needs:    Medical: Not on file    Non-medical: Not on file  Tobacco Use  . Smoking status: Former Smoker    Years: 3.00    Last attempt to quit: 07/12/1984    Years since quitting: 33.4  . Smokeless tobacco: Former Systems developer    Types: Snuff  . Tobacco comment: "EXPERIMENTED" W/SMOKING--NOT EVEN A PACK X 1 WEEK.   Substance and Sexual Activity  . Alcohol use: Yes    Comment: 1-2 drinks per week  . Drug use: No  . Sexual activity: Not on file    Comment: vasectomy-- 2010  Lifestyle  . Physical activity:    Days per week: Not on file    Minutes per session: Not on file  . Stress: Not on file  Relationships  . Social connections:    Talks on  phone: Not on file    Gets together: Not on file    Attends religious service: Not on file    Active member of club or organization: Not on file    Attends meetings of clubs or organizations: Not on file    Relationship status: Not on file  . Intimate partner violence:    Fear of current or ex partner: Not on file    Emotionally abused: Not on file    Physically abused: Not on file    Forced sexual activity: Not on file  Other Topics Concern  . Not on file  Social History Narrative  . Not on file   Family History  Problem Relation Age of Onset  . Diabetes Maternal Grandmother   . Colon cancer Maternal Grandmother 87  . Diabetes Mother   .  Hepatitis C Mother   . Prostate cancer Father   . Diabetes Paternal Grandmother   . Mental illness Brother   . Mental illness Sister   . Mental illness Daughter   . Stomach cancer Maternal Grandfather   . COPD Other        Aunt  . Diabetes Other        siblings  . Pancreatic cancer Paternal Aunt   . Esophageal cancer Neg Hx   . Rectal cancer Neg Hx    No Known Allergies  Medication list has been reviewed and updated.   General: Denies fever, chills, sweats. No significant weight loss. Eyes: Denies blurring,significant itching ENT: Denies earache, sore throat, and hoarseness. Cardiovascular: Denies chest pains, palpitations, dyspnea on exertion Respiratory: Denies cough, dyspnea at rest,wheeezing Breast: no concerns about lumps GI: Denies nausea, vomiting, diarrhea, constipation, change in bowel habits, abdominal pain, melena, hematochezia GU: Denies penile discharge, ED, urinary flow / outflow problems. No STD concerns. Musculoskeletal: Denies back pain, joint pain Derm: Denies rash, itching Neuro: Denies  paresthesias, frequent falls, frequent headaches Psych: Denies depression, anxiety Endocrine: Denies cold intolerance, heat intolerance, polydipsia Heme: Denies enlarged lymph nodes Allergy: No hayfever  Objective:   BP 140/90   Pulse 76   Temp (!) 97.5 F (36.4 C) (Oral)   Ht 5' 11.5" (1.816 m)   Wt 247 lb 8 oz (112.3 kg)   BMI 34.04 kg/m  Ideal Body Weight: Weight in (lb) to have BMI = 25: 181.4  No exam data present  GEN: well developed, well nourished, no acute distress Eyes: conjunctiva and lids normal, PERRLA, EOMI ENT: TM clear, nares clear, oral exam WNL Neck: supple, no lymphadenopathy, no thyromegaly, no JVD Pulm: clear to auscultation and percussion, respiratory effort normal CV: regular rate and rhythm, S1-S2, no murmur, rub or gallop, no bruits, peripheral pulses normal and symmetric, no cyanosis, clubbing, edema or varicosities GI: soft,  non-tender; no hepatosplenomegaly, masses; active bowel sounds all quadrants GU: no hernia, testicular mass, penile discharge Lymph: no cervical, axillary or inguinal adenopathy MSK: gait normal, muscle tone and strength WNL, no joint swelling, effusions, discoloration, crepitus  SKIN: clear, good turgor, color WNL, no rashes, lesions, or ulcerations Neuro: normal mental status, normal strength, sensation, and motion Psych: alert; oriented to person, place and time, normally interactive and not anxious or depressed in appearance. All labs reviewed with patient.  Lipids:    Component Value Date/Time   CHOL 205 (H) 12/06/2017 1021   TRIG 165.0 (H) 12/06/2017 1021   HDL 46.40 12/06/2017 1021   LDLDIRECT 109.0 08/20/2015 0751   VLDL 33.0 12/06/2017 1021   CHOLHDL 4 12/06/2017 1021  CBC: CBC Latest Ref Rng & Units 12/06/2017 08/14/2017 03/08/2017  WBC 4.0 - 10.5 K/uL 2.8(L) 2.4(L) 3.1(L)  Hemoglobin 13.0 - 17.0 g/dL 16.4 16.0 14.6  Hematocrit 39.0 - 52.0 % 47.8 46.8 44.2  Platelets 150.0 - 400.0 K/uL 142.0(L) 131(L) 186.0    Basic Metabolic Panel:    Component Value Date/Time   NA 140 12/06/2017 1021   K 4.5 12/06/2017 1021   CL 101 12/06/2017 1021   CO2 32 12/06/2017 1021   BUN 18 12/06/2017 1021   CREATININE 1.21 12/06/2017 1021   CREATININE 1.28 01/17/2014 1732   GLUCOSE 107 (H) 12/06/2017 1021   CALCIUM 9.7 12/06/2017 1021   Hepatic Function Latest Ref Rng & Units 12/06/2017 08/26/2016 08/20/2015  Total Protein 6.0 - 8.3 g/dL 7.5 7.0 6.9  Albumin 3.5 - 5.2 g/dL 4.4 4.5 4.1  AST 0 - 37 U/L 54(H) 28 43(H)  ALT 0 - 53 U/L 41 33 34  Alk Phosphatase 39 - 117 U/L 64 75 65  Total Bilirubin 0.2 - 1.2 mg/dL 0.7 0.7 0.5  Bilirubin, Direct 0.0 - 0.3 mg/dL 0.2 0.1 0.1    Lab Results  Component Value Date   TSH 2.584 05/06/2009   Lab Results  Component Value Date   PSA 0.37 12/06/2017   PSA 0.44 08/26/2016   PSA 0.50 08/20/2015    Assessment and Plan:   Healthcare  maintenance  Need for prophylactic vaccination with combined diphtheria-tetanus-pertussis (DTP) vaccine - Plan: Tdap vaccine greater than or equal to 7yo IM   Doing well overall - will work on fitness, weight loss this year.  Health Maintenance Exam: The patient's preventative maintenance and recommended screening tests for an annual wellness exam were reviewed in full today. Brought up to date unless services declined.  Counselled on the importance of diet, exercise, and its role in overall health and mortality. The patient's FH and SH was reviewed, including their home life, tobacco status, and drug and alcohol status.  Follow-up in 1 year for physical exam or additional follow-up below.  Follow-up: No follow-ups on file. Or follow-up in 1 year if not noted.  Signed,  Maud Deed. Jerney Baksh, MD   Allergies as of 12/12/2017   No Known Allergies     Medication List        Accurate as of 12/12/17 11:59 PM. Always use your most recent med list.          albuterol 108 (90 Base) MCG/ACT inhaler Commonly known as:  PROVENTIL HFA;VENTOLIN HFA INHALE 2 PUFFS 4 TIMES A DAY   cetirizine 10 MG tablet Commonly known as:  ZYRTEC Take 10 mg by mouth every morning.   FLOVENT HFA 110 MCG/ACT inhaler Generic drug:  fluticasone INHALE 1 PUFF INTO THE LUNGS 2 (TWO) TIMES DAILY   fluticasone 50 MCG/ACT nasal spray Commonly known as:  FLONASE PLACE 2 SPRAYS INTO BOTH NOSTRILS DAILY.   MEGA MULTIVITAMIN FOR MEN Tabs Take 1 tablet by mouth daily.   omeprazole 40 MG capsule Commonly known as:  PRILOSEC Take 40 mg by mouth daily.

## 2017-12-13 ENCOUNTER — Encounter: Payer: Self-pay | Admitting: Family Medicine

## 2017-12-23 ENCOUNTER — Telehealth: Payer: Self-pay

## 2017-12-23 NOTE — Telephone Encounter (Signed)
I spoke with pt; pt was seen annual exam on 12/12/17. Pt having tightness in chest and difficulty breathing on and off; pt taking flovent, zyrtec and albuterol inhaler regularly which helps the tightness in chest and difficulty breathing for awhile but recently pt having to use inhaler more often than usual. Pt does not think related to heart; No CP,H/A, dizziness or vision changes. Pt request referral to pulmonologist. Please advise. If pt condition changes or worsens prior to cb pt will go to ED.

## 2017-12-23 NOTE — Telephone Encounter (Signed)
Copied from [place] [id number]. Topic: Referral - Request >> Dec 23, 2017 10:13 AM Yvette Rack wrote: Reason for CRM: Pt requests a referral to a Pulmonologist. Cb# [phone]

## 2017-12-26 ENCOUNTER — Other Ambulatory Visit: Payer: Self-pay | Admitting: Family Medicine

## 2017-12-26 DIAGNOSIS — J4540 Moderate persistent asthma, uncomplicated: Secondary | ICD-10-CM

## 2017-12-26 NOTE — Telephone Encounter (Signed)
Done - reasonable request.

## 2017-12-26 NOTE — Progress Notes (Signed)
done

## 2017-12-29 ENCOUNTER — Other Ambulatory Visit: Payer: Self-pay | Admitting: *Deleted

## 2017-12-29 MED ORDER — FLUTICASONE PROPIONATE 50 MCG/ACT NA SUSP
2.0000 | Freq: Every day | NASAL | 11 refills | Status: DC
Start: 2017-12-29 — End: 2019-05-17

## 2018-01-02 NOTE — Progress Notes (Signed)
[facility] Consultation      Assessment and Plan:  Allergic asthma with dyspnea on exertion. - Exacerbated by allergies, possibly GERD. - Stop Flovent, start Symbicort, Singulair. - If not improved at next visit will consider checking IgE, Rast.  GERD. - Symptomatic GERD, currently sleeping on 3 pillow incline with continued symptoms on omeprazole. - Patient also complains of a globus sensation of something being stuck in his throat, which may be secondary to GERD. - We will refer to gastroenterology for further work-up and medication management.  Mild obstructive sleep apnea. - HST in 2014 with AHI of 10. - Can consider retesting and treatment, if patient is symptomatic, particularly in light of uncontrolled asthma.   Date: 01/03/2018  MRN# [id number] Clarence Alvarez [birth year]    Clarence Alvarez is a 52 y.o. old male seen in consultation for chief complaint of:    Chief Complaint  Patient presents with  . Consult    Referred by Dr. Edilia Bo  . Shortness of Breath    upon awakening am and with exertion    HPI:  He notes that he has burning in his throat, and sometimes feels that he has a knot in his throat and tightness in his chest. This has been going on for a few weeks. He was diagnosed with asthma as a child, he played sports and was not limited by breathing.  He works as a Secondary school teacher, he notes trouble around certain scents and perfumes, wheat, cats. He takes flovent 1 puff twice daily, and does not feel that it is helping. He used to work out daily, he got the flu in 2018 and does not feel that he has recovered, he does not work out anymore because of dyspnea, however he does push a mower without a problem.   He has never been tested for allergies, he takes a zyrtec, has been on it for 7 years.  He does have reflux and has been taking omeprazole, and still has some mild symptoms. He sleeps propped up on 3 pillows.  He has no pets.  He  denies sinus drainage.  He has been told that he snores at night. He has done a sleep study about 4 years ago and was told that he did not require CPAP, his weight has increased since then.    **Office spirometry 01/03/2018>> FVC is 87% predicted, FEV1 is 76% predicted, ratio is 69%.  This test is suggestive of mild obstruction. **Abs Eosinophil 12/06/17; 200.  **Chest x-ray 08/14/2017>> imaging personally reviewed, lungs are normal. **Home sleep test 02/24/2013>> patient's weight was 242 pounds, AHI was 10.  PMHX:   Past Medical History:  Diagnosis Date  . Allergic rhinitis, seasonal   . History of adenomatous polyp of colon    03-25-2014  tubular adenoma's /  hyperplastic  . History of prostatitis   . Left ureteral stone   . Mild obstructive sleep apnea    per study 2014  . Moderate persistent asthma    followed by pcp-- uncomplicated   Surgical Hx:  Past Surgical History:  Procedure Laterality Date  . COLONOSCOPY  03/25/2014  . SHOULDER ARTHROSCOPY WITH ROTATOR CUFF REPAIR AND SUBACROMIAL DECOMPRESSION Left 07-24-2010  dr Noemi Chapel @  [place] [facility]   and Labrum tear debridement   Family Hx:  Family History  Problem Relation Age of Onset  . Diabetes Maternal Grandmother   . Colon cancer Maternal Grandmother 87  . Diabetes Mother   . Hepatitis C Mother   .  Prostate cancer Father   . Diabetes Paternal Grandmother   . Mental illness Brother   . Mental illness Sister   . Mental illness Daughter   . Stomach cancer Maternal Grandfather   . COPD Other        Aunt  . Diabetes Other        siblings  . Pancreatic cancer Paternal Aunt   . Esophageal cancer Neg Hx   . Rectal cancer Neg Hx    Social Hx:   Social History   Tobacco Use  . Smoking status: Former Smoker    Years: 3.00    Last attempt to quit: 07/12/1984    Years since quitting: 33.5  . Smokeless tobacco: Former Systems developer    Types: Snuff  . Tobacco comment: "EXPERIMENTED" W/SMOKING--NOT EVEN A PACK X 1 WEEK.   Substance Use  Topics  . Alcohol use: Yes    Comment: 1-2 drinks per week  . Drug use: No   Medication:    Current Outpatient Medications:  .  albuterol (PROVENTIL HFA;VENTOLIN HFA) 108 (90 Base) MCG/ACT inhaler, INHALE 2 PUFFS 4 TIMES A DAY, Disp: 8.5 Inhaler, Rfl: 5 .  cetirizine (ZYRTEC) 10 MG tablet, Take 10 mg by mouth every morning. , Disp: , Rfl:  .  FLOVENT HFA 110 MCG/ACT inhaler, INHALE 1 PUFF INTO THE LUNGS 2 (TWO) TIMES DAILY, Disp: 12 g, Rfl: 5 .  fluticasone (FLONASE) 50 MCG/ACT nasal spray, Place 2 sprays into both nostrils daily., Disp: 16 g, Rfl: 11 .  Multiple Vitamins-Minerals (MEGA MULTIVITAMIN FOR MEN) TABS, Take 1 tablet by mouth daily., Disp: , Rfl:  .  omeprazole (PRILOSEC) 40 MG capsule, Take 40 mg by mouth daily., Disp: , Rfl:    Allergies:  Patient has no known allergies.  Review of Systems: Gen:  Denies  fever, sweats, chills HEENT: Denies blurred vision, double vision. bleeds, sore throat Cvc:  No dizziness, chest pain. Resp:   Denies cough or sputum production, shortness of breath Gi: Denies swallowing difficulty, stomach pain. Gu:  Denies bladder incontinence, burning urine Ext:   No Joint pain, stiffness. Skin: No skin rash,  hives  Endoc:  No polyuria, polydipsia. Psych: No depression, insomnia. Other:  All other systems were reviewed with the patient and were negative other that what is mentioned in the HPI.   Physical Examination:   VS: BP (!) 144/100 (BP Location: Left Arm, Cuff Size: Large)   Pulse 69   Resp 16   Ht 5' 11.5" (1.816 m)   Wt 247 lb (112 kg)   SpO2 100%   BMI 33.97 kg/m   General Appearance: No distress  Neuro:without focal findings,  speech normal,  HEENT: PERRLA, EOM intact.   Pulmonary: normal breath sounds, No wheezing.  CardiovascularNormal S1,S2.  No m/r/g.   Abdomen: Benign, Soft, non-tender. Renal:  No costovertebral tenderness  GU:  No performed at this time. Endoc: No evident thyromegaly, no signs of acromegaly. Skin:    warm, no rashes, no ecchymosis  Extremities: normal, no cyanosis, clubbing.  Other findings:    LABORATORY PANEL:   CBC No results for input(s): WBC, HGB, HCT, PLT in the last 168 hours. ------------------------------------------------------------------------------------------------------------------  Chemistries  No results for input(s): NA, K, CL, CO2, GLUCOSE, BUN, CREATININE, CALCIUM, MG, AST, ALT, ALKPHOS, BILITOT in the last 168 hours.  Invalid input(s): GFRCGP ------------------------------------------------------------------------------------------------------------------  Cardiac Enzymes No results for input(s): TROPONINI in the last 168 hours. ------------------------------------------------------------  RADIOLOGY:  No results found.     Thank  you for the consultation and for allowing [facility] to assist in the care of your patient. Our recommendations are noted above.  Please contact us if we can be of further service.   Marda Stalker, M.D., F.C.C.P.  Board Certified in Internal Medicine, Pulmonary Medicine, [place], and Sleep Medicine.  [facility] Office Number: [phone]   01/03/2018

## 2018-01-03 ENCOUNTER — Encounter: Payer: Self-pay | Admitting: Internal Medicine

## 2018-01-03 ENCOUNTER — Ambulatory Visit: Payer: Commercial Managed Care - PPO | Admitting: Internal Medicine

## 2018-01-03 VITALS — BP 144/100 | HR 69 | Resp 16 | Ht 71.5 in | Wt 247.0 lb

## 2018-01-03 DIAGNOSIS — K219 Gastro-esophageal reflux disease without esophagitis: Secondary | ICD-10-CM | POA: Diagnosis not present

## 2018-01-03 DIAGNOSIS — R0602 Shortness of breath: Secondary | ICD-10-CM

## 2018-01-03 MED ORDER — BUDESONIDE-FORMOTEROL FUMARATE 160-4.5 MCG/ACT IN AERO: 2.0000 | INHALATION_SPRAY | Freq: Two times a day (BID) | RESPIRATORY_TRACT | 0 refills | Status: DC

## 2018-01-03 MED ORDER — MONTELUKAST SODIUM 10 MG PO TABS: 10.0000 mg | ORAL_TABLET | Freq: Every day | ORAL | 3 refills | Status: DC

## 2018-01-03 NOTE — Patient Instructions (Addendum)
Stop flovent, start symbicort 2 puffs twice per and rinse mouth after use.  Will start singulair.  Will refer you to gastroenterology.

## 2018-01-03 NOTE — Addendum Note (Signed)
Addended by: Stephanie Coup on: 01/03/2018 10:58 AM   Modules accepted: Orders

## 2018-01-17 ENCOUNTER — Telehealth: Payer: Self-pay | Admitting: Internal Medicine

## 2018-01-17 NOTE — Telephone Encounter (Signed)
Pt informed he can go online and type in Symbicort copay card and he can get one. Nothing further needed.

## 2018-01-17 NOTE — Telephone Encounter (Signed)
Pt states he has misplaced his coupon card for Symbicort. Please call.

## 2018-01-23 ENCOUNTER — Other Ambulatory Visit: Payer: Self-pay | Admitting: Internal Medicine

## 2018-01-23 MED ORDER — BUDESONIDE-FORMOTEROL FUMARATE 160-4.5 MCG/ACT IN AERO: 2.0000 | INHALATION_SPRAY | Freq: Two times a day (BID) | RESPIRATORY_TRACT | 2 refills | Status: DC

## 2018-01-25 ENCOUNTER — Telehealth: Payer: Self-pay | Admitting: Family Medicine

## 2018-01-25 NOTE — Telephone Encounter (Signed)
Pt dropped off physical form to be filled out. Placed in RX tower. °

## 2018-01-25 NOTE — Telephone Encounter (Signed)
On your desk

## 2018-01-30 ENCOUNTER — Institutional Professional Consult (permissible substitution): Payer: Self-pay | Admitting: Internal Medicine

## 2018-02-14 ENCOUNTER — Encounter: Payer: Self-pay | Admitting: Gastroenterology

## 2018-02-14 ENCOUNTER — Other Ambulatory Visit: Payer: Self-pay | Admitting: Internal Medicine

## 2018-02-14 ENCOUNTER — Encounter

## 2018-02-14 ENCOUNTER — Other Ambulatory Visit: Payer: Self-pay

## 2018-02-14 ENCOUNTER — Ambulatory Visit: Payer: Commercial Managed Care - PPO | Admitting: Gastroenterology

## 2018-02-14 VITALS — BP 145/88 | HR 93 | Ht 71.0 in | Wt 249.6 lb

## 2018-02-14 DIAGNOSIS — K219 Gastro-esophageal reflux disease without esophagitis: Secondary | ICD-10-CM

## 2018-02-14 NOTE — Progress Notes (Signed)
Jonathon Bellows MD, MRCP(U.K) [address]  [place]  [place], [place] [address]  Main: [phone]  Fax: [phone]   Gastroenterology Consultation  Referring Provider:     Owens Loffler, MD Primary Care Physician:  Owens Loffler, MD Primary Gastroenterologist:  Dr. Jonathon Bellows  Reason for Consultation:     GERD        HPI:   Clarence Alvarez is a 52 y.o. y/o male referred for consultation & management  by Dr. Owens Loffler, MD.    He has been referred for GERD. He has a history of Asthma and sees Dr Ashby Dawes . At his last visit with him there was some concern for Dysphagia/Globus sensation.   Reflux:  Onset : many years > 15 years, stopped taking Prilosec recently and switched to to Pepcid. His symptoms of reflux have been waking up gagging , acid in the mouth , sleeps elevated. He has also had abdominal discomfort. Symptoms were no worse on stopping Prilosec as he had been on it for long  Recent weight gain: yes, no change in symptoms Medications: takes pepcid at night before dinner, does not control all his symptoms. Has discomfort in his throat , like something like a know in the throat , food sometimes gets stuck in his chest . No heartburn , no regurgitation  Narcotics or anticholinergics use : no  PPI /H2 blockers or Antacid  use and timing :as above  Dinner time : usually around 7.30 - 8 pm , bedtime around 11 pm  - in between sitting up  Prior EGD: no  Family history of esophageal cancer:no   Not a smoker. Reflux worse when he is asthma attacks.        Past Medical History:  Diagnosis Date  . Allergic rhinitis, seasonal   . History of adenomatous polyp of colon    03-25-2014  tubular adenoma's /  hyperplastic  . History of prostatitis   . Left ureteral stone   . Mild obstructive sleep apnea    per study 2014  . Moderate persistent asthma    followed by pcp-- uncomplicated    Past Surgical History:  Procedure Laterality Date  .  COLONOSCOPY  03/25/2014  . SHOULDER ARTHROSCOPY WITH ROTATOR CUFF REPAIR AND SUBACROMIAL DECOMPRESSION Left 07-24-2010  dr Noemi Chapel @  [facility]   and Labrum tear debridement    Prior to Admission medications   Medication Sig Start Date End Date Taking? Authorizing Provider  albuterol (PROVENTIL HFA;VENTOLIN HFA) 108 (90 Base) MCG/ACT inhaler INHALE 2 PUFFS 4 TIMES A DAY 05/19/17  Yes Copland, Frederico Hamman, MD  cetirizine (ZYRTEC) 10 MG tablet Take 10 mg by mouth every morning.    Yes [provider]  fluticasone (FLONASE) 50 MCG/ACT nasal spray Place 2 sprays into both nostrils daily. 12/29/17  Yes Copland, Frederico Hamman, MD  montelukast (SINGULAIR) 10 MG tablet Take 1 tablet (10 mg total) by mouth at bedtime. 01/03/18  Yes Laverle Hobby, MD  Multiple Vitamins-Minerals (MEGA MULTIVITAMIN FOR MEN) TABS Take 1 tablet by mouth daily.   Yes [provider]  omeprazole (PRILOSEC) 40 MG capsule Take 40 mg by mouth daily.   Yes [provider]  SYMBICORT 160-4.5 MCG/ACT inhaler TAKE 2 PUFFS BY MOUTH TWICE A DAY 02/14/18  Yes Laverle Hobby, MD  albuterol (PROVENTIL,VENTOLIN) 90 MCG/ACT inhaler Inhale 2 puffs into the lungs every 6 (six) hours as needed for wheezing. 02/15/11 09/29/11  Katherina Mires, MD    Family History  Problem Relation  Age of Onset  . Diabetes Maternal Grandmother   . Colon cancer Maternal Grandmother 87  . Diabetes Mother   . Hepatitis C Mother   . Prostate cancer Father   . Diabetes Paternal Grandmother   . Mental illness Brother   . Mental illness Sister   . Mental illness Daughter   . Stomach cancer Maternal Grandfather   . COPD Other        Aunt  . Diabetes Other        siblings  . Pancreatic cancer Paternal Aunt   . Esophageal cancer Neg Hx   . Rectal cancer Neg Hx      Social History   Tobacco Use  . Smoking status: Former Smoker    Years: 3.00    Last attempt to quit: 07/12/1984    Years since quitting: 33.6  . Smokeless tobacco: Former  Systems developer    Types: Snuff  . Tobacco comment: "EXPERIMENTED" W/SMOKING--NOT EVEN A PACK X 1 WEEK.   Substance Use Topics  . Alcohol use: Yes    Comment: 1-2 drinks per week  . Drug use: No    Allergies as of 02/14/2018  . (No Known Allergies)    Review of Systems:    All systems reviewed and negative except where noted in HPI.   Physical Exam:  There were no vitals taken for this visit. No LMP for male patient. Psych:  Alert and cooperative. Normal mood and affect. General:   Alert,  Well-developed, well-nourished, pleasant and cooperative in NAD Head:  Normocephalic and atraumatic. Eyes:  Sclera clear, no icterus.   Conjunctiva pink. Ears:  Normal auditory acuity. Nose:  No deformity, discharge, or lesions. Mouth:  No deformity or lesions,oropharynx pink & moist. Neck:  Supple; no masses or thyromegaly. Lungs:  Respirations even and unlabored.  Clear throughout to auscultation.   No wheezes, crackles, or rhonchi. No acute distress. Heart:  Regular rate and rhythm; no murmurs, clicks, rubs, or gallops. Abdomen:  Normal bowel sounds.  No bruits.  Soft, non-tender and non-distended without masses, hepatosplenomegaly or hernias noted.  No guarding or rebound tenderness.    Extremities:  No clubbing or edema.  No cyanosis. Neurologic:  Alert and oriented x3;  grossly normal neurologically. Skin:  Intact without significant lesions or rashes. No jaundice. Lymph Nodes:  No significant cervical adenopathy. Psych:  Alert and cooperative. Normal mood and affect.  Imaging Studies: No results found.  Assessment and Plan:   Clarence Alvarez is a 52 y.o. y/o male has been referred for GERD/Dysphagia/Globus sensation    Plan  1. GERD : Counseled on life style changes, suggest to use PPI first thing in the morning on empty stomach and eat 30 minutes after. Advised on the use of a wedge pillow at night , avoid meals for 2 hours prior to bed time. Weight loss .Discussed the risks and benefits  of long term PPI use including but not limited to bone loss, chronic kidney disease, infections , low magnesium . Aim to use at the lowest dose for the shortest period of time . Will start with Zantac BID  2. EGD+ biopsies to screen for Barrets and EOE  I have discussed alternative options, risks & benefits,  which include, but are not limited to, bleeding, infection, perforation,respiratory complication & drug reaction.  The patient agrees with this plan & written consent will be obtained.     Follow up in 3 months  Dr Jonathon Bellows MD,MRCP(U.K)

## 2018-02-24 ENCOUNTER — Encounter
Admission: RE | Disposition: A | Discharge status: Home / Self Care | Payer: Self-pay | Source: Ambulatory Visit | Attending: Gastroenterology

## 2018-02-24 ENCOUNTER — Ambulatory Visit: Payer: Commercial Managed Care - PPO | Admitting: Certified Registered Nurse Anesthetist

## 2018-02-24 ENCOUNTER — Other Ambulatory Visit: Payer: Self-pay

## 2018-02-24 ENCOUNTER — Ambulatory Visit
Admission: RE | Admit: 2018-02-24 | Discharge: 2018-02-24 | Disposition: A | Discharge status: Home / Self Care | Payer: Commercial Managed Care - PPO | Source: Ambulatory Visit | Attending: Gastroenterology | Admitting: Gastroenterology

## 2018-02-24 ENCOUNTER — Encounter: Payer: Self-pay | Admitting: *Deleted

## 2018-02-24 DIAGNOSIS — Z79899 Other long term (current) drug therapy: Secondary | ICD-10-CM | POA: Insufficient documentation

## 2018-02-24 DIAGNOSIS — B9681 Helicobacter pylori [H. pylori] as the cause of diseases classified elsewhere: Secondary | ICD-10-CM | POA: Insufficient documentation

## 2018-02-24 DIAGNOSIS — M1990 Unspecified osteoarthritis, unspecified site: Secondary | ICD-10-CM | POA: Insufficient documentation

## 2018-02-24 DIAGNOSIS — G4733 Obstructive sleep apnea (adult) (pediatric): Secondary | ICD-10-CM | POA: Insufficient documentation

## 2018-02-24 DIAGNOSIS — Z7951 Long term (current) use of inhaled steroids: Secondary | ICD-10-CM | POA: Diagnosis not present

## 2018-02-24 DIAGNOSIS — K2950 Unspecified chronic gastritis without bleeding: Secondary | ICD-10-CM | POA: Insufficient documentation

## 2018-02-24 DIAGNOSIS — K219 Gastro-esophageal reflux disease without esophagitis: Secondary | ICD-10-CM

## 2018-02-24 DIAGNOSIS — J302 Other seasonal allergic rhinitis: Secondary | ICD-10-CM | POA: Insufficient documentation

## 2018-02-24 DIAGNOSIS — J4540 Moderate persistent asthma, uncomplicated: Secondary | ICD-10-CM | POA: Diagnosis not present

## 2018-02-24 DIAGNOSIS — Z87891 Personal history of nicotine dependence: Secondary | ICD-10-CM | POA: Diagnosis not present

## 2018-02-24 DIAGNOSIS — R1310 Dysphagia, unspecified: Secondary | ICD-10-CM

## 2018-02-24 DIAGNOSIS — K2970 Gastritis, unspecified, without bleeding: Secondary | ICD-10-CM

## 2018-02-24 HISTORY — PX: ESOPHAGOGASTRODUODENOSCOPY (EGD) WITH PROPOFOL: SHX5813

## 2018-02-24 SURGERY — ESOPHAGOGASTRODUODENOSCOPY (EGD) WITH PROPOFOL
Anesthesia: General

## 2018-02-24 MED ORDER — GLYCOPYRROLATE 0.2 MG/ML IJ SOLN
INTRAMUSCULAR | Status: DC | PRN
  Administered 2018-02-24: 0.2 mg via INTRAVENOUS

## 2018-02-24 MED ORDER — PROPOFOL 10 MG/ML IV BOLUS
INTRAVENOUS | Status: DC | PRN
  Administered 2018-02-24: 20 mg via INTRAVENOUS
  Administered 2018-02-24: 80 mg via INTRAVENOUS

## 2018-02-24 MED ORDER — PROPOFOL 500 MG/50ML IV EMUL
INTRAVENOUS | Status: DC | PRN
  Administered 2018-02-24: 200 ug/kg/min via INTRAVENOUS

## 2018-02-24 MED ORDER — MIDAZOLAM HCL 2 MG/2ML IJ SOLN
INTRAMUSCULAR | Status: AC
  Filled 2018-02-24: qty 2

## 2018-02-24 MED ORDER — GLYCOPYRROLATE 0.2 MG/ML IJ SOLN
INTRAMUSCULAR | Status: AC
  Filled 2018-02-24: qty 1

## 2018-02-24 MED ORDER — LIDOCAINE HCL (CARDIAC) PF 100 MG/5ML IV SOSY
PREFILLED_SYRINGE | INTRAVENOUS | Status: DC | PRN
  Administered 2018-02-24: 100 mg via INTRAVENOUS

## 2018-02-24 MED ORDER — MIDAZOLAM HCL 2 MG/2ML IJ SOLN
INTRAMUSCULAR | Status: DC | PRN
  Administered 2018-02-24: 2 mg via INTRAVENOUS

## 2018-02-24 MED ORDER — SODIUM CHLORIDE 0.9 % IV SOLN
INTRAVENOUS | Status: DC
  Administered 2018-02-24: 10:00:00 via INTRAVENOUS

## 2018-02-24 NOTE — Anesthesia Preprocedure Evaluation (Addendum)
Anesthesia Evaluation  Patient identified by MRN, date of birth, ID band Patient awake    Reviewed: Allergy & Precautions, NPO status , Patient's Chart, lab work & pertinent test results  Airway Mallampati: II  TM Distance: >3 FB     Dental  (+) Upper Dentures   Pulmonary asthma , sleep apnea , former smoker,    Pulmonary exam normal        Cardiovascular negative cardio ROS Normal cardiovascular exam     Neuro/Psych  Neuromuscular disease negative psych ROS   GI/Hepatic Neg liver ROS, Hx of polyps   Endo/Other  negative endocrine ROS  Renal/GU negative Renal ROS  negative genitourinary   Musculoskeletal  (+) Arthritis ,   Abdominal Normal abdominal exam  (+)   Peds negative pediatric ROS (+)  Hematology negative hematology ROS (+)   Anesthesia Other Findings   Reproductive/Obstetrics                            Anesthesia Physical Anesthesia Plan  ASA: II  Anesthesia Plan: General   Post-op Pain Management:    Induction: Intravenous  PONV Risk Score and Plan: TIVA  Airway Management Planned: Nasal Cannula  Additional Equipment:   Intra-op Plan:   Post-operative Plan:   Informed Consent: I have reviewed the patients History and Physical, chart, labs and discussed the procedure including the risks, benefits and alternatives for the proposed anesthesia with the patient or authorized representative who has indicated his/her understanding and acceptance.   Dental advisory given  Plan Discussed with: CRNA and Surgeon  Anesthesia Plan Comments:         Anesthesia Quick Evaluation

## 2018-02-24 NOTE — H&P (Signed)
Clarence Bellows, MD [address], [place], [place], [place], [address] [address], [place], [place], [place], [address] Phone: [phone]  Fax: [phone]  Primary Care Physician:  Owens Loffler, MD   Pre-Procedure History & Physical: HPI:  BERTIS HUSTEAD is a 52 y.o. male is here for an endoscopy    Past Medical History:  Diagnosis Date  . Allergic rhinitis, seasonal   . History of adenomatous polyp of colon    03-25-2014  tubular adenoma's /  hyperplastic  . History of prostatitis   . Left ureteral stone   . Mild obstructive sleep apnea    per study 2014  . Moderate persistent asthma    followed by pcp-- uncomplicated    Past Surgical History:  Procedure Laterality Date  . anterior cervucal    . COLONOSCOPY  03/25/2014  . SHOULDER ARTHROSCOPY WITH ROTATOR CUFF REPAIR AND SUBACROMIAL DECOMPRESSION Left 07-24-2010  dr Noemi Chapel @  [facility]   and Labrum tear debridement    Prior to Admission medications   Medication Sig Start Date End Date Taking? Authorizing Provider  albuterol (PROVENTIL HFA;VENTOLIN HFA) 108 (90 Base) MCG/ACT inhaler INHALE 2 PUFFS 4 TIMES A DAY 05/19/17  Yes Copland, Frederico Hamman, MD  cetirizine (ZYRTEC) 10 MG tablet Take 10 mg by mouth every morning.    Yes [provider]  fluticasone (FLONASE) 50 MCG/ACT nasal spray Place 2 sprays into both nostrils daily. 12/29/17  Yes Copland, Frederico Hamman, MD  montelukast (SINGULAIR) 10 MG tablet Take 1 tablet (10 mg total) by mouth at bedtime. 01/03/18  Yes Laverle Hobby, MD  Multiple Vitamins-Minerals (MEGA MULTIVITAMIN FOR MEN) TABS Take 1 tablet by mouth daily.   Yes [provider]  SYMBICORT 160-4.5 MCG/ACT inhaler TAKE 2 PUFFS BY MOUTH TWICE A DAY 02/14/18  Yes Laverle Hobby, MD  omeprazole (PRILOSEC) 40 MG capsule Take 40 mg by mouth daily.    [provider]  albuterol (PROVENTIL,VENTOLIN) 90 MCG/ACT inhaler Inhale 2 puffs into the lungs every 6 (six) hours as needed  for wheezing. 02/15/11 09/29/11  Katherina Mires, MD    Allergies as of 02/15/2018  . (No Known Allergies)    Family History  Problem Relation Age of Onset  . Diabetes Maternal Grandmother   . Colon cancer Maternal Grandmother 87  . Diabetes Mother   . Hepatitis C Mother   . Prostate cancer Father   . Diabetes Paternal Grandmother   . Mental illness Brother   . Mental illness Sister   . Mental illness Daughter   . Stomach cancer Maternal Grandfather   . COPD Other        Aunt  . Diabetes Other        siblings  . Pancreatic cancer Paternal Aunt   . Esophageal cancer Neg Hx   . Rectal cancer Neg Hx     Social History   Socioeconomic History  . Marital status: Divorced    Spouse name: Not on file  . Number of children: Not on file  . Years of education: Not on file  . Highest education level: Not on file  Occupational History  . 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 Financial resource strain: Not on file  . Food insecurity:    Worry: Not on file    Inability: Not on file  . Transportation needs:    Medical: Not on  file    Non-medical: Not on file  Tobacco Use  . Smoking status: Former Smoker    Years: 3.00    Last attempt to quit: 07/12/1984    Years since quitting: 33.6  . Smokeless tobacco: Former Systems developer    Types: Snuff  . Tobacco comment: "EXPERIMENTED" W/SMOKING--NOT EVEN A PACK X 1 WEEK.   Substance and Sexual Activity  . Alcohol use: Yes    Alcohol/week: 3.0 standard drinks    Types: 3 Shots of liquor per week    Comment: 1-2 drinks per week  . Drug use: No  . Sexual activity: Not on file    Comment: vasectomy-- 2010  Lifestyle  . Physical activity:    Days per week: Not on file    Minutes per session: Not on file  . Stress: Not on file  Relationships  . Social connections:    Talks on phone: Not on file    Gets together: Not on file    Attends  religious service: Not on file    Active member of club or organization: Not on file    Attends meetings of clubs or organizations: Not on file    Relationship status: Not on file  . Intimate partner violence:    Fear of current or ex partner: Not on file    Emotionally abused: Not on file    Physically abused: Not on file    Forced sexual activity: Not on file  Other Topics Concern  . Not on file  Social History Narrative  . Not on file    Review of Systems: See HPI, otherwise negative ROS  Physical Exam: BP (!) 154/100   Pulse 64   Temp (!) 95.1 F (35.1 C) (Tympanic)   Resp 18   Ht 5\' 11"  (1.803 m)   Wt 108.9 kg   SpO2 99%   BMI 33.47 kg/m  General:   Alert,  pleasant and cooperative in NAD Head:  Normocephalic and atraumatic. Neck:  Supple; no masses or thyromegaly. Lungs:  Clear throughout to auscultation, normal respiratory effort.    Heart:  +S1, +S2, Regular rate and rhythm, No edema. Abdomen:  Soft, nontender and nondistended. Normal bowel sounds, without guarding, and without rebound.   Neurologic:  Alert and  oriented x4;  grossly normal neurologically.  Impression/Plan: ACEN CRAUN is here for an endoscopy  to be performed for  evaluation of GERD and globus sensation     Risks, benefits, limitations, and alternatives regarding endoscopy and dilation have been reviewed with the patient.  Questions have been answered.  All parties agreeable.   Clarence Bellows, MD  02/24/2018, 9:57 AM

## 2018-02-24 NOTE — Op Note (Signed)
[facility] Gastroenterology Patient Name: Clarence Alvarez Procedure Date: 02/24/2018 10:04 AM MRN: [id number] Account #: [id number] Date of Birth: 07-May-1966 Admit Type: Outpatient Age: 52 Room: [facility] ENDO ROOM 4 Gender: Male Note Status: Finalized Procedure:            Upper GI endoscopy Indications:          Dysphagia, Screening for Barrett's esophagus Providers:            Jonathon Bellows MD, MD Referring MD:         Maud Deed. Copland MD, MD (Referring MD) Medicines:            Monitored Anesthesia Care Complications:        No immediate complications. Procedure:            Pre-Anesthesia Assessment:                       - Prior to the procedure, a History and Physical was                        performed, and patient medications, allergies and                        sensitivities were reviewed. The patient's tolerance of                        previous anesthesia was reviewed.                       - The risks and benefits of the procedure and the                        sedation options and risks were discussed with the                        patient. All questions were answered and informed                        consent was obtained.                       - ASA Grade Assessment: II - A patient with mild                        systemic disease.                       After obtaining informed consent, the endoscope was                        passed under direct vision. Throughout the procedure,                        the patient's blood pressure, pulse, and oxygen                        saturations were monitored continuously. The Endoscope                        was introduced through the mouth, and advanced to the  third part of duodenum. The upper GI endoscopy was                        accomplished with ease. The patient tolerated the                        procedure well. Findings:      The examined duodenum was normal.      Patchy  moderate inflammation characterized by congestion (edema) and       erythema was found in the stomach. Biopsies were taken with a cold       forceps for histology.      A hypertonic lower esophageal sphincter was found. There was moderate       resistance to endoscope advancement into the stomach. The Z-line was       regular. The gastroesophageal junction and cardia were normal on       retroflexed view. Biopsies were obtained from the proximal and distal       esophagus with cold forceps for histology of suspected eosinophilic       esophagitis.      The cardia and gastric fundus were normal on retroflexion. Impression:           - Normal examined duodenum.                       - Gastritis. Biopsied.                       - The examination was suspicious for achalasia.                        Biopsied. Recommendation:       - Discharge patient to home (with escort).                       - Resume previous diet.                       - Continue present medications.                       - Await pathology results.                       - Perform routine esophageal manometry in 2 weeks.                       - Return to my office in 6 weeks. Procedure Code(s):    --- Professional ---                       [phone], Esophagogastroduodenoscopy, flexible, transoral;                        with biopsy, single or multiple Diagnosis Code(s):    --- Professional ---                       K29.70, Gastritis, unspecified, without bleeding                       R13.10, Dysphagia, unspecified  Z13.810, Encounter for screening for upper                        gastrointestinal disorder CPT copyright 2017 American [employer]. All rights reserved. The codes documented in this report are preliminary and upon coder review may  be revised to meet current compliance requirements. Jonathon Bellows, MD Jonathon Bellows MD, MD 02/24/2018 10:16:07 AM This report has been signed  electronically. Number of Addenda: 0 Note Initiated On: 02/24/2018 10:04 AM      [facility]

## 2018-02-24 NOTE — Anesthesia Procedure Notes (Signed)
Performed by: Ocean Schildt, CRNA Pre-anesthesia Checklist: Patient identified, Emergency Drugs available, Suction available, Patient being monitored and Timeout performed Patient Re-evaluated:Patient Re-evaluated prior to induction Oxygen Delivery Method: Nasal cannula Induction Type: IV induction       

## 2018-02-24 NOTE — Anesthesia Post-op Follow-up Note (Signed)
Anesthesia QCDR form completed.        

## 2018-02-24 NOTE — Transfer of Care (Signed)
Immediate Anesthesia Transfer of Care Note  Patient: Clarence Alvarez  Procedure(s) Performed: ESOPHAGOGASTRODUODENOSCOPY (EGD) WITH PROPOFOL (N/A )  Patient Location: PACU  Anesthesia Type:General  Level of Consciousness: sedated  Airway & Oxygen Therapy: Patient Spontanous Breathing and Patient connected to nasal cannula oxygen  Post-op Assessment: Report given to RN and Post -op Vital signs reviewed and stable  Post vital signs: Reviewed and stable  Last Vitals:  Vitals Value Taken Time  BP    Temp    Pulse    Resp    SpO2      Last Pain:  Vitals:   02/24/18 0917  TempSrc: Tympanic  PainSc: 2          Complications: No apparent anesthesia complications

## 2018-02-24 NOTE — Anesthesia Postprocedure Evaluation (Signed)
Anesthesia Post Note  Patient: Clarence Alvarez  Procedure(s) Performed: ESOPHAGOGASTRODUODENOSCOPY (EGD) WITH PROPOFOL (N/A )  Patient location during evaluation: PACU Anesthesia Type: General Level of consciousness: awake and alert and oriented Pain management: pain level controlled Vital Signs Assessment: post-procedure vital signs reviewed and stable Respiratory status: spontaneous breathing Cardiovascular status: blood pressure returned to baseline Anesthetic complications: no     Last Vitals:  Vitals:   02/24/18 1058 02/24/18 1108  BP: (!) 151/100 (!) 141/87  Pulse: 74 65  Resp: 19 (!) 22  Temp:    SpO2: 97% 96%    Last Pain:  Vitals:   02/24/18 1058  TempSrc:   PainSc: 0-No pain                 Deyanira Fesler

## 2018-02-27 ENCOUNTER — Encounter: Payer: Self-pay | Admitting: Gastroenterology

## 2018-02-27 ENCOUNTER — Telehealth: Payer: Self-pay | Admitting: Internal Medicine

## 2018-02-27 NOTE — Telephone Encounter (Signed)
It would be possible but unusual. Should stop symbicort and go back to previous flovent inhaler. Should be started on singulair 10 mg qhs.

## 2018-02-27 NOTE — Telephone Encounter (Signed)
Patient was recently placed on Symbicort medication Since patient has been experiencing extremely high blood pressure, which has never been an issue before Would like to know if there are other options Please call to discuss

## 2018-02-27 NOTE — Telephone Encounter (Signed)
Pt already on Singulair. Pt advised to d/c Symbicort and go back on Flovent. Nothing further needed.

## 2018-02-28 LAB — SURGICAL PATHOLOGY

## 2018-02-28 IMAGING — DX DG LUMBAR SPINE COMPLETE 4+V
5 series · 5 of 5 positions shown · non-contrast
Comparison: None.

CLINICAL DATA: Low back pain, no known injury, initial encounter

EXAM:
LUMBAR SPINE - COMPLETE 4+ VIEW

[l-spine ap]
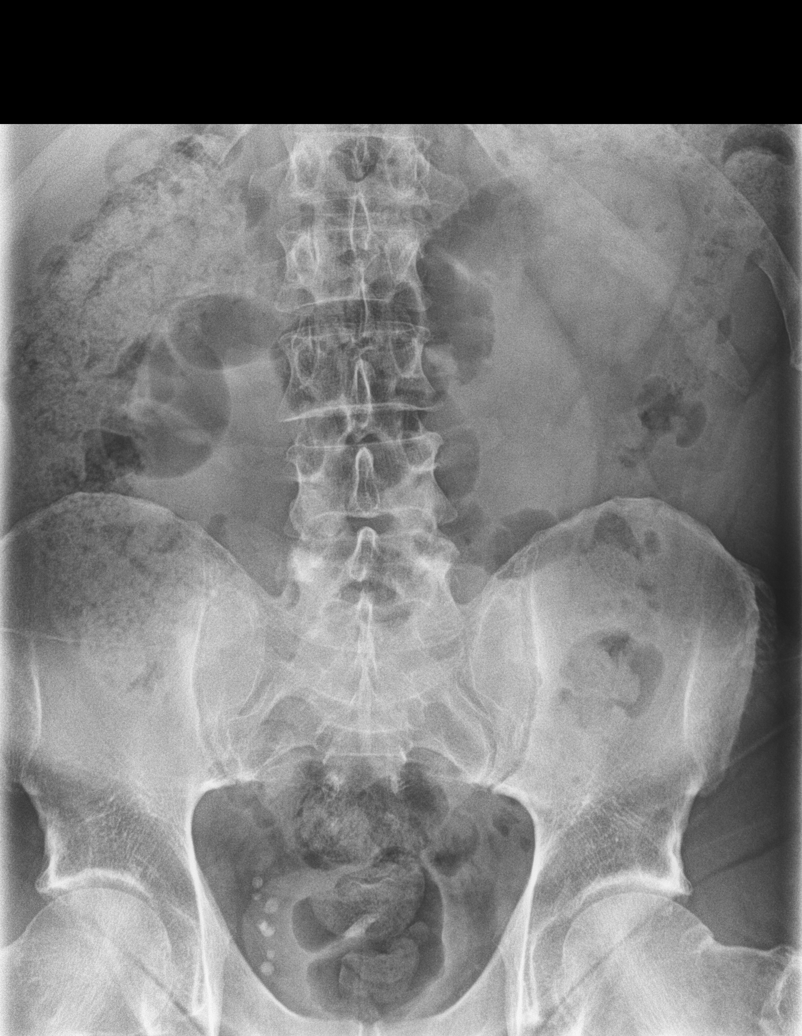

[l-spine obl (1 of 2)]
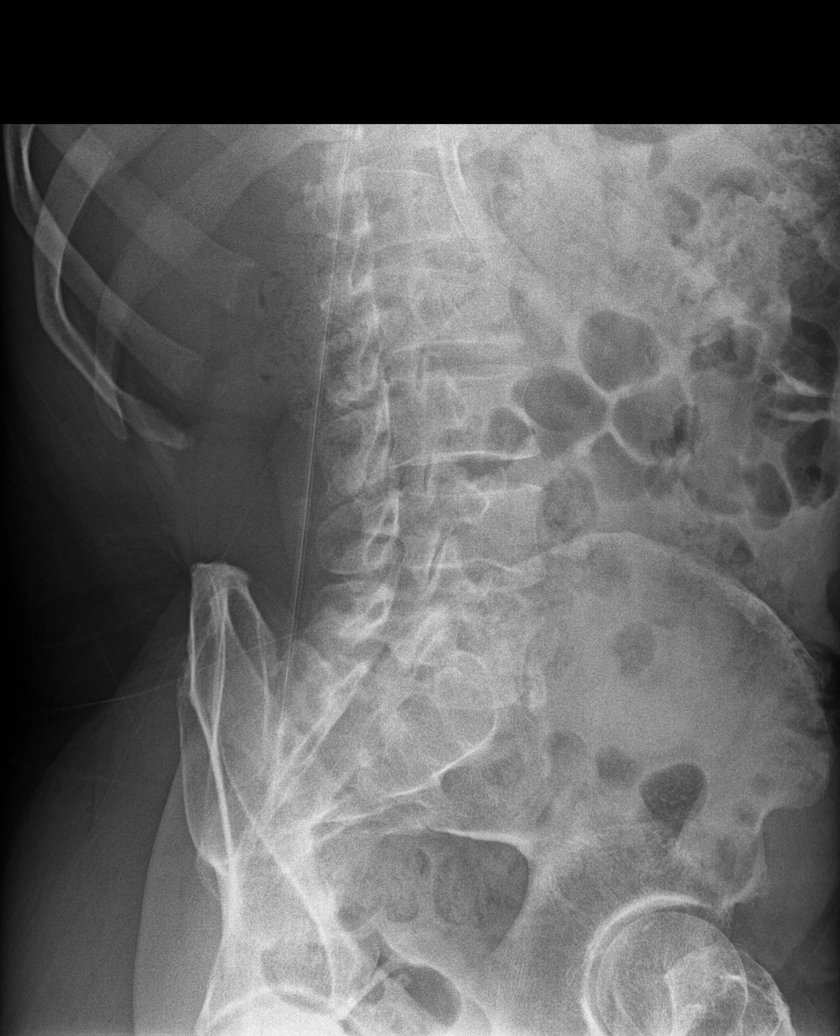

[l-spine obl (2 of 2)]
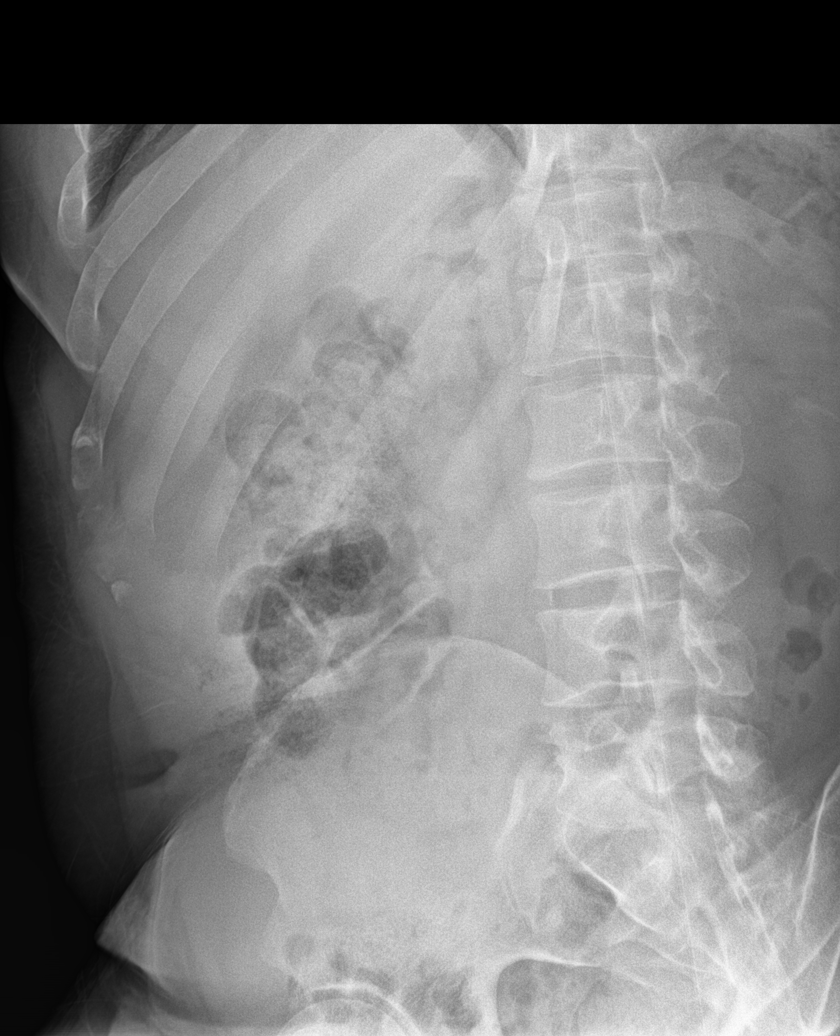

[l-spine lat]
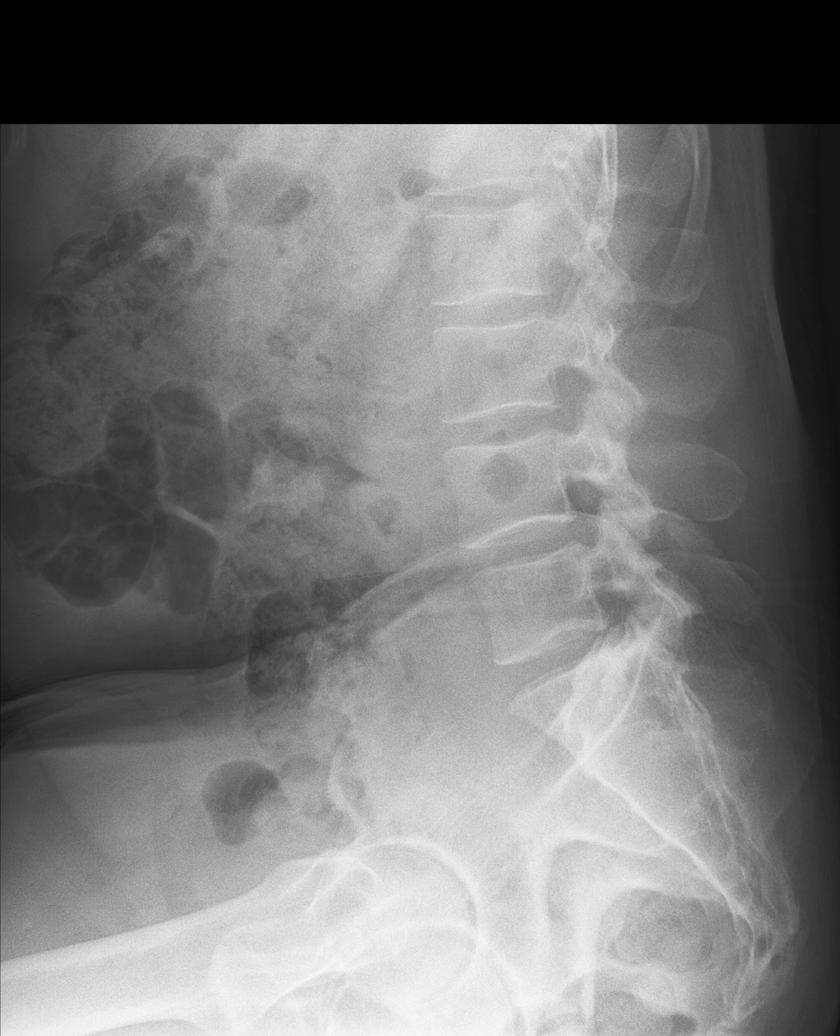

[l-spine l5/s1]
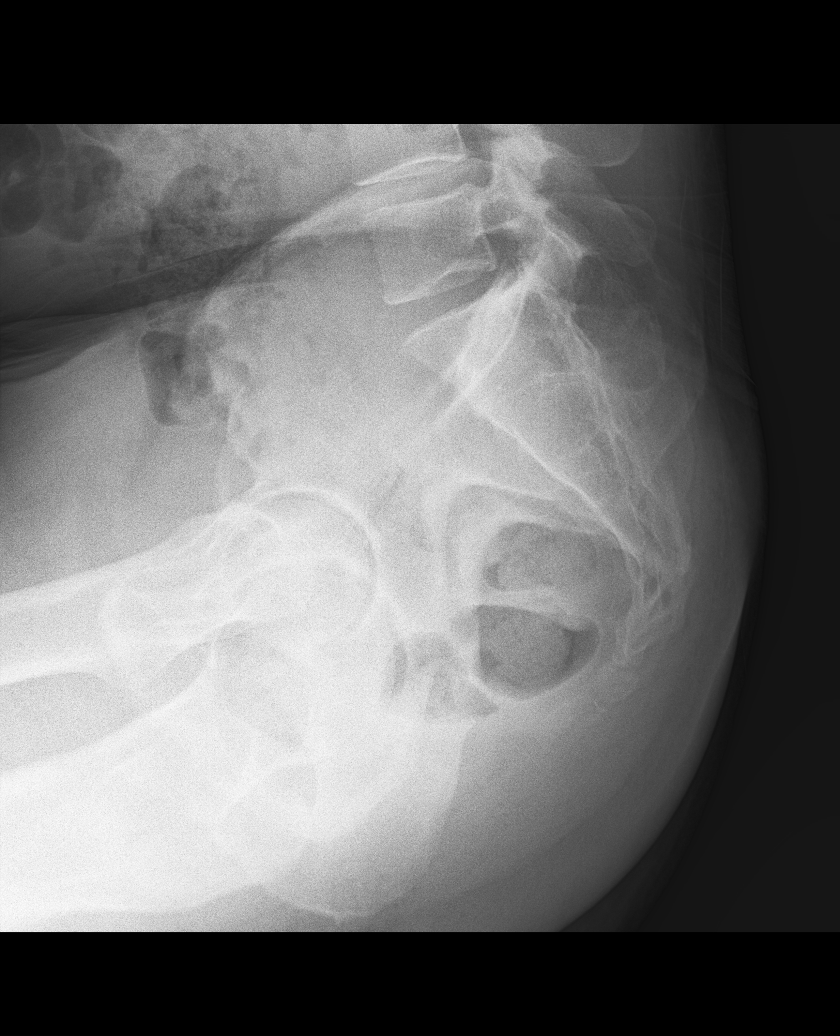

[5 of 5 positions shown; findings below may reference images not displayed]

FINDINGS: Five lumbar type vertebral bodies are well visualized. Vertebral
body height is well maintained. No pars defects are noted. Mild
facet hypertrophic changes are seen. No anterolisthesis is noted.
Minimal disc space narrowing is noted at L4-5. Changes of mild
constipation are seen.
IMPRESSION: Mild degenerative change without acute abnormality.

Mild constipation.

## 2018-03-02 ENCOUNTER — Telehealth: Payer: Self-pay | Admitting: Gastroenterology

## 2018-03-02 NOTE — Telephone Encounter (Signed)
Pt left vm he states he has an upper Gi 02/24/18 and would like the results of findings/Labs

## 2018-03-02 NOTE — Telephone Encounter (Signed)
Inform - H pylori gastritis.   Suggest clarithromycin 500 mg PO BID, amoxicillin 1 gram TID, omeprazole 20 mg BID all for 14 days.  , check for penicillin allergy and will need repeat H pylori stool antigen to check for eradication after .   Reflux esophagitis also seen , no barrettes   Needs to see me in the office as well to discuss about manometry for dysphagia or can have it set up

## 2018-03-03 ENCOUNTER — Other Ambulatory Visit: Payer: Self-pay

## 2018-03-03 MED ORDER — OMEPRAZOLE 20 MG PO CPDR: 20.0000 mg | DELAYED_RELEASE_CAPSULE | Freq: Every day | ORAL | 1 refills | Status: DC

## 2018-03-03 MED ORDER — AMOXICILLIN 500 MG PO CAPS: 1000.0000 mg | ORAL_CAPSULE | Freq: Two times a day (BID) | ORAL | 0 refills | Status: DC

## 2018-03-03 MED ORDER — CLARITHROMYCIN 500 MG PO TABS: 500.0000 mg | ORAL_TABLET | Freq: Two times a day (BID) | ORAL | 0 refills | Status: DC

## 2018-03-03 NOTE — Telephone Encounter (Signed)
Pt notified of EGD results. Pt aware rx's will be sent to his pharmacy.   Pt would like to wait until his office appt to discuss Manometry appt.

## 2018-03-14 ENCOUNTER — Ambulatory Visit: Payer: Commercial Managed Care - PPO | Admitting: Nurse Practitioner

## 2018-03-14 ENCOUNTER — Encounter: Payer: Self-pay | Admitting: Nurse Practitioner

## 2018-03-14 VITALS — BP 154/96 | HR 85 | Temp 97.7°F | Ht 71.0 in | Wt 251.0 lb

## 2018-03-14 DIAGNOSIS — R1084 Generalized abdominal pain: Secondary | ICD-10-CM

## 2018-03-14 DIAGNOSIS — R112 Nausea with vomiting, unspecified: Secondary | ICD-10-CM

## 2018-03-14 MED ORDER — ONDANSETRON HCL 4 MG PO TABS: 4.0000 mg | ORAL_TABLET | Freq: Once | ORAL | Status: DC

## 2018-03-14 MED ORDER — GI COCKTAIL ~~LOC~~: 30.0000 mL | Freq: Once | ORAL | 0 refills | Status: DC

## 2018-03-14 MED ORDER — ONDANSETRON HCL 4 MG PO TABS: 4.0000 mg | ORAL_TABLET | Freq: Three times a day (TID) | ORAL | 0 refills | Status: DC | PRN

## 2018-03-14 MED ORDER — ONDANSETRON HCL 4 MG/2ML IJ SOLN
4.0000 mg | Freq: Once | INTRAMUSCULAR | Status: AC
  Administered 2018-03-14: 4 mg via INTRAMUSCULAR

## 2018-03-14 NOTE — Progress Notes (Signed)
Subjective:  Patient ID: Clarence Alvarez, male    DOB: [birth year]  Age: 52 y.o. MRN: [id number]  CC: Dizziness (patient is complaining of dizzienss,discomfort--pain in abd area,cold sweat and headache. this is has been going on today. FYI patient report found tick on him 2 days ago. patient taking abx right now for  gastritis)  Abdominal Pain  This is a new problem. The current episode started today. The onset quality is sudden. The problem occurs constantly. The problem has been unchanged. The pain is located in the generalized abdominal region. The quality of the pain is colicky, a sensation of fullness and cramping. Associated symptoms include anorexia, headaches, nausea and vomiting. Pertinent negatives include no belching, constipation, diarrhea, fever, frequency, hematochezia, hematuria, melena or weight loss. The pain is aggravated by movement, eating, palpation and certain positions. The pain is relieved by being still. He has tried nothing for the symptoms. Prior diagnostic workup includes GI consult, lower endoscopy and upper endoscopy. His past medical history is significant for GERD. There is no history of gallstones or irritable bowel syndrome.  diagnosed with gastritis secondary to H. Pylori, current use of amoxicillin and clarithromycin, took medication today on empty stomach   Also reports removal of tick on penis on Sunday. Worked in his yard on Saturday, found tick on Sunday morning.  Reviewed past Medical, Social and Family history today.  Outpatient Medications Prior to Visit  Medication Sig Dispense Refill  . albuterol (PROVENTIL HFA;VENTOLIN HFA) 108 (90 Base) MCG/ACT inhaler INHALE 2 PUFFS 4 TIMES A DAY 8.5 Inhaler 5  . amoxicillin (AMOXIL) 500 MG capsule Take 2 capsules (1,000 mg total) by mouth 2 (two) times daily. 56 capsule 0  . cetirizine (ZYRTEC) 10 MG tablet Take 10 mg by mouth every morning.     . clarithromycin (BIAXIN) 500 MG tablet Take 1 tablet (500 mg total)  by mouth 2 (two) times daily. 28 tablet 0  . fluticasone (FLONASE) 50 MCG/ACT nasal spray Place 2 sprays into both nostrils daily. 16 g 11  . montelukast (SINGULAIR) 10 MG tablet Take 1 tablet (10 mg total) by mouth at bedtime. 30 tablet 3  . omeprazole (PRILOSEC) 20 MG capsule Take 1 capsule (20 mg total) by mouth daily. 60 capsule 1  . SYMBICORT 160-4.5 MCG/ACT inhaler TAKE 2 PUFFS BY MOUTH TWICE A DAY 10.2 Inhaler 5  . Multiple Vitamins-Minerals (MEGA MULTIVITAMIN FOR MEN) TABS Take 1 tablet by mouth daily.     No facility-administered medications prior to visit.     ROS See HPI  Objective:  BP (!) 154/96   Pulse 85   Temp 97.7 F (36.5 C) (Oral)   Ht 5\' 11"  (1.803 m)   Wt 251 lb (113.9 kg)   SpO2 96%   BMI 35.01 kg/m   BP Readings from Last 3 Encounters:  03/14/18 (!) 154/96  02/24/18 (!) 141/87  02/14/18 (!) 145/88    Wt Readings from Last 3 Encounters:  03/14/18 251 lb (113.9 kg)  02/24/18 240 lb (108.9 kg)  02/14/18 249 lb 9.6 oz (113.2 kg)    Physical Exam  Constitutional: He is oriented to person, place, and time.  Neck: Normal range of motion. Neck supple.  Cardiovascular: Normal rate.  Pulmonary/Chest: Effort normal.  Abdominal: There is tenderness. There is guarding.  Musculoskeletal: He exhibits no edema.  Neurological: He is alert and oriented to person, place, and time.  Skin: No rash noted. No erythema.  Psychiatric: He has a normal mood and affect.  His behavior is normal. Thought content normal.  Vitals reviewed.   Lab Results  Component Value Date   WBC 2.8 (L) 12/06/2017   HGB 16.4 12/06/2017   HCT 47.8 12/06/2017   PLT 142.0 (L) 12/06/2017   GLUCOSE 107 (H) 12/06/2017   CHOL 205 (H) 12/06/2017   TRIG 165.0 (H) 12/06/2017   HDL 46.40 12/06/2017   LDLDIRECT 109.0 08/20/2015   LDLCALC 126 (H) 12/06/2017   ALT 41 12/06/2017   AST 54 (H) 12/06/2017   NA 140 12/06/2017   K 4.5 12/06/2017   CL 101 12/06/2017   CREATININE 1.21 12/06/2017     BUN 18 12/06/2017   CO2 32 12/06/2017   TSH 2.584 05/06/2009   PSA 0.37 12/06/2017   HGBA1C 6.1 08/26/2016    Assessment & Plan:   Brigg was seen today for dizziness.  Diagnoses and all orders for this visit:  Intractable vomiting with nausea, unspecified vomiting type -     ondansetron (ZOFRAN) 4 MG tablet; Take 1 tablet (4 mg total) by mouth every 8 (eight) hours as needed for nausea or vomiting. -     Discontinue: ondansetron (ZOFRAN) tablet 4 mg -     ondansetron (ZOFRAN) injection 4 mg  Generalized abdominal pain -     Discontinue: Alum & Mag Hydroxide-Simeth (GI COCKTAIL) SUSP suspension; Take 30 mLs by mouth once for 1 dose. Shake well. -     Alum & Mag Hydroxide-Simeth (GI COCKTAIL) SUSP suspension; Take 30 mLs by mouth once for 1 dose. Mix maalox 1ml, viscous lidocaine 38ml, and donnatal 65ml Shake well.   I have changed Jairo Ben. Halteman's gi cocktail. I am also having him start on ondansetron. Additionally, I am having him maintain his cetirizine, MEGA MULTIVITAMIN FOR MEN, albuterol, fluticasone, montelukast, SYMBICORT, amoxicillin, clarithromycin, and omeprazole. We administered ondansetron.  Meds ordered this encounter  Medications  . ondansetron (ZOFRAN) 4 MG tablet    Sig: Take 1 tablet (4 mg total) by mouth every 8 (eight) hours as needed for nausea or vomiting.    Dispense:  20 tablet    Refill:  0    Order Specific Question:   Supervising Provider    Answer:   Lucille Passy [[id number]]  . DISCONTD: Alum & Mag Hydroxide-Simeth (GI COCKTAIL) SUSP suspension    Sig: Take 30 mLs by mouth once for 1 dose. Shake well.    Dispense:  30 mL    Refill:  0    Order Specific Question:   Supervising Provider    Answer:   Lucille Passy [[id number]]  . DISCONTD: ondansetron (ZOFRAN) tablet 4 mg  . Alum & Mag Hydroxide-Simeth (GI COCKTAIL) SUSP suspension    Sig: Take 30 mLs by mouth once for 1 dose. Mix maalox 67ml, viscous lidocaine 56ml, and donnatal 35ml Shake well.     Dispense:  30 mL    Refill:  0    Order Specific Question:   Supervising Provider    Answer:   Lucille Passy [[id number]]  . ondansetron (ZOFRAN) injection 4 mg    Follow-up: No follow-ups on file.  Wilfred Lacy, NP

## 2018-03-14 NOTE — Patient Instructions (Addendum)
I think you symptoms are as results of taking oral abx on empty stomach. Hold tonight's dose of oral abx. Resume tomorrow, take with food  maintain clear liquid diet x 24hrs.  Go to hospital if no improvement in 3hrs.  Current oral abx course covers lyme disease treatment.  Clear Liquid Diet A clear liquid diet means that you only have liquids that you can see through. You do not eat any food on this diet. Most people need to follow this diet for only a short time. What do I need to know about this diet?  A clear liquid is a liquid that you can see through when you hold it up to a light.  This diet does not give you all the nutrients that you need. Choose a variety of the liquids that your doctor says you can drink on this diet. That way, you will get as many nutrients as possible.  If you are not sure whether you can have certain items, ask your doctor. What can I have?  Water and flavored water.  Fruit juices that do not have pulp, such as cranberry juice and apple juice.  Tea and coffee without milk or cream.  Clear bouillon or broth.  Broth-based soups that have been strained.  Flavored gelatins.  Honey.  Sugar water.  Frozen ice or frozen ice pops that do not have any milk, yogurt, fruit pieces, or fruit pulp in them.  Clear sodas.  Clear sports drinks. The items listed above may not be a complete list of recommended liquids. Contact your food and nutrition expert (dietitian) for more options. What can I not have?  Juices that have pulp.  Milk.  Cream or cream-based soups.  Yogurt. The items listed above may not be a complete list of liquids to avoid. Contact your food and nutrition expert for more information. Summary  A clear liquid diet is a diet that includes only liquids that you can see through.  The goal of this diet is to help you recover.  Make sure to avoid liquids with milk, cream, or pulp while you are on this diet. This information is not  intended to replace advice given to you by your health care provider. Make sure you discuss any questions you have with your health care provider. Document Released: 06/10/2008 Document Revised: 02/09/2016 Document Reviewed: 05/25/2013 Elsevier Interactive Patient Education  2017 [employer].

## 2018-03-15 ENCOUNTER — Emergency Department (HOSPITAL_COMMUNITY): Payer: Commercial Managed Care - PPO

## 2018-03-15 ENCOUNTER — Encounter: Payer: Self-pay | Admitting: Nurse Practitioner

## 2018-03-15 ENCOUNTER — Ambulatory Visit: Payer: Self-pay | Admitting: Family Medicine

## 2018-03-15 ENCOUNTER — Emergency Department (HOSPITAL_COMMUNITY)
Admission: EM | Admit: 2018-03-15 | Discharge: 2018-03-16 | Disposition: A | Discharge status: Home / Self Care | Payer: Commercial Managed Care - PPO | Attending: Emergency Medicine | Admitting: Emergency Medicine

## 2018-03-15 DIAGNOSIS — J45909 Unspecified asthma, uncomplicated: Secondary | ICD-10-CM | POA: Diagnosis not present

## 2018-03-15 DIAGNOSIS — R51 Headache: Secondary | ICD-10-CM | POA: Insufficient documentation

## 2018-03-15 DIAGNOSIS — I10 Essential (primary) hypertension: Secondary | ICD-10-CM

## 2018-03-15 DIAGNOSIS — Z87891 Personal history of nicotine dependence: Secondary | ICD-10-CM | POA: Insufficient documentation

## 2018-03-15 DIAGNOSIS — Z79899 Other long term (current) drug therapy: Secondary | ICD-10-CM | POA: Diagnosis not present

## 2018-03-15 DIAGNOSIS — W57XXXD Bitten or stung by nonvenomous insect and other nonvenomous arthropods, subsequent encounter: Secondary | ICD-10-CM | POA: Diagnosis not present

## 2018-03-15 LAB — COMPREHENSIVE METABOLIC PANEL
ALT: 52 U/L — ABNORMAL HIGH (ref 0–44)
AST: 79 U/L — ABNORMAL HIGH (ref 15–41)
Albumin: 4 g/dL (ref 3.5–5.0)
Alkaline Phosphatase: 68 U/L (ref 38–126)
Anion gap: 9 (ref 5–15)
BUN: 14 mg/dL (ref 6–20)
CO2: 28 mmol/L (ref 22–32)
Calcium: 9.2 mg/dL (ref 8.9–10.3)
Chloride: 100 mmol/L (ref 98–111)
Creatinine, Ser: 1.22 mg/dL (ref 0.61–1.24)
GFR calc Af Amer: >60 mL/min (ref >60)
GFR calc non Af Amer: >60 mL/min (ref >60)
Glucose, Bld: 124 mg/dL — ABNORMAL HIGH (ref 70–99)
Potassium: 3.7 mmol/L (ref 3.5–5.1)
Sodium: 137 mmol/L (ref 135–145)
Total Bilirubin: 1.4 mg/dL — ABNORMAL HIGH (ref 0.3–1.2)
Total Protein: 7.2 g/dL (ref 6.5–8.1)

## 2018-03-15 LAB — URINALYSIS, ROUTINE W REFLEX MICROSCOPIC
Bilirubin Urine: NEGATIVE
Glucose, UA: NEGATIVE mg/dL
Hgb urine dipstick: NEGATIVE
Ketones, ur: NEGATIVE mg/dL
Leukocytes, UA: NEGATIVE
Nitrite: NEGATIVE
Protein, ur: NEGATIVE mg/dL
Specific Gravity, Urine: 1.006 (ref 1.005–1.030)
pH: 7 (ref 5.0–8.0)

## 2018-03-15 LAB — LIPASE, BLOOD: Lipase: 26 U/L (ref 11–51)

## 2018-03-15 LAB — CBC
HCT: 48.6 % (ref 39.0–52.0)
Hemoglobin: 16.2 g/dL (ref 13.0–17.0)
MCH: 30.9 pg (ref 26.0–34.0)
MCHC: 33.3 g/dL (ref 30.0–36.0)
MCV: 92.6 fL (ref 78.0–100.0)
Platelets: 173 10*3/uL (ref 150–400)
RBC: 5.25 MIL/uL (ref 4.22–5.81)
RDW: 12.8 % (ref 11.5–15.5)
WBC: 4 10*3/uL (ref 4.0–10.5)

## 2018-03-15 MED ORDER — AMLODIPINE BESYLATE 5 MG PO TABS
5.0000 mg | ORAL_TABLET | Freq: Once | ORAL | Status: AC
  Administered 2018-03-15: 5 mg via ORAL
  Filled 2018-03-15: qty 1

## 2018-03-15 MED ORDER — DOXYCYCLINE HYCLATE 100 MG PO CAPS: 100.0000 mg | ORAL_CAPSULE | Freq: Two times a day (BID) | ORAL | 0 refills | Status: DC

## 2018-03-15 MED ORDER — AMLODIPINE BESYLATE 5 MG PO TABS: 5.0000 mg | ORAL_TABLET | Freq: Every day | ORAL | 0 refills | Status: DC

## 2018-03-15 NOTE — ED Provider Notes (Signed)
[facility] EMERGENCY DEPARTMENT Provider Note   CSN: [id number] Arrival date & time: 03/15/18  1243   History   Chief Complaint Chief Complaint  Patient presents with  . Hypertension    HPI Clarence Alvarez is a 52 y.o. male with a hx of asthma, OSA, nephrolithiasis, GERD, and h.pylori gastritis who presents to the ED with concern for hypertension. Patient reports that he has been having trouble with elevated blood pressure since June of this year, for this reason he has been keeping a blood pressure log. He checks his blood pressure 1-2 times per week with systolic pressures ranging 120s-140s. He states that he has not been formally diagnosed with hypertension or started on anti-hypertensive medications. He relays that over the weekend he noted a tic to his penis which he removed completely. He states that yesterday he started to feel generally poorly with malaise, arthralgias (most prominent to R elbow), abdominal discomfort, lightheadedness, dizziness, bilateral blurry vision and headache. He states headache had gradual onset, steady progression, and has been fairly constant. He presented to an alternative [place] primary care clinic as his typical PCP could not fit him into the schedule. He was seen at the office and his BP was somewhat more elevated than usual in the 150s. He was given a shot of medicine which he is unsure of the name of and discharged home. He was told that his abx from his gastritis (clarithromycin/amoxicillin) would cover for tick infection.  He states today he is feeling somewhat better, but his BP remains elevated and his higher. He remains with headache, bilateral temporal area, pounding in nature, 3/10 in severity, BP this AM was 712W systolic followed by 580D prompting ER visit. Patient states his abdominal discomfort is consistent wit his gastritis and fairly unchanged. Denies fever, current change in vision/dizziness/lightheadedness, weakness, numbness,  chest pain, dyspnea, or change in urination. Denies hematochezia, melena, or diarrhea.    HPI  Past Medical History:  Diagnosis Date  . Allergic rhinitis, seasonal   . History of adenomatous polyp of colon    03-25-2014  tubular adenoma's /  hyperplastic  . History of prostatitis   . Left ureteral stone   . Mild obstructive sleep apnea    per study 2014  . Moderate persistent asthma    followed by pcp-- uncomplicated    Patient Active Problem List   Diagnosis Date Noted  . Lumbar pain with radiation down right leg 12/23/2016  . OSA (obstructive sleep apnea) 01/26/2013  . ALLERGIC RHINITIS, SEASONAL 09/15/2009  . Asthma 09/08/2006    Past Surgical History:  Procedure Laterality Date  . anterior cervucal    . COLONOSCOPY  03/25/2014  . ESOPHAGOGASTRODUODENOSCOPY (EGD) WITH PROPOFOL N/A 02/24/2018   Procedure: ESOPHAGOGASTRODUODENOSCOPY (EGD) WITH PROPOFOL;  Surgeon: Jonathon Bellows, MD;  Location: [facility] ENDOSCOPY;  Service: Gastroenterology;  Laterality: N/A;  . SHOULDER ARTHROSCOPY WITH ROTATOR CUFF REPAIR AND SUBACROMIAL DECOMPRESSION Left 07-24-2010  dr Noemi Chapel @  [facility]   and Labrum tear debridement        Home Medications    Prior to Admission medications   Medication Sig Start Date End Date Taking? Authorizing Provider  albuterol (PROVENTIL HFA;VENTOLIN HFA) 108 (90 Base) MCG/ACT inhaler INHALE 2 PUFFS 4 TIMES A DAY 05/19/17   Copland, Spencer, MD  Alum & Mag Hydroxide-Simeth (GI COCKTAIL) SUSP suspension Take 30 mLs by mouth once for 1 dose. Mix maalox 80ml, viscous lidocaine 18ml, and donnatal 30ml Shake well. 03/14/18 03/14/18  Nche, Charlene Brooke,  NP  amoxicillin (AMOXIL) 500 MG capsule Take 2 capsules (1,000 mg total) by mouth 2 (two) times daily. 03/03/18   Jonathon Bellows, MD  cetirizine (ZYRTEC) 10 MG tablet Take 10 mg by mouth every morning.     [provider]  clarithromycin (BIAXIN) 500 MG tablet Take 1 tablet (500 mg total) by mouth 2 (two) times daily. 03/03/18    Jonathon Bellows, MD  fluticasone [facility]) 50 MCG/ACT nasal spray Place 2 sprays into both nostrils daily. 12/29/17   Copland, Frederico Hamman, MD  montelukast (SINGULAIR) 10 MG tablet Take 1 tablet (10 mg total) by mouth at bedtime. 01/03/18   Laverle Hobby, MD  Multiple Vitamins-Minerals (MEGA MULTIVITAMIN FOR MEN) TABS Take 1 tablet by mouth daily.    [provider]  omeprazole (PRILOSEC) 20 MG capsule Take 1 capsule (20 mg total) by mouth daily. 03/03/18   Jonathon Bellows, MD  ondansetron (ZOFRAN) 4 MG tablet Take 1 tablet (4 mg total) by mouth every 8 (eight) hours as needed for nausea or vomiting. 03/14/18   Nche, Charlene Brooke, NP  SYMBICORT 160-4.5 MCG/ACT inhaler TAKE 2 PUFFS BY MOUTH TWICE A DAY 02/14/18   Laverle Hobby, MD  albuterol (PROVENTIL,VENTOLIN) 90 MCG/ACT inhaler Inhale 2 puffs into the lungs every 6 (six) hours as needed for wheezing. 02/15/11 09/29/11  Katherina Mires, MD    Family History Family History  Problem Relation Age of Onset  . Diabetes Maternal Grandmother   . Colon cancer Maternal Grandmother 87  . Diabetes Mother   . Hepatitis C Mother   . Prostate cancer Father   . Diabetes Paternal Grandmother   . Mental illness Brother   . Mental illness Sister   . Mental illness Daughter   . Stomach cancer Maternal Grandfather   . COPD Other        Aunt  . Diabetes Other        siblings  . Pancreatic cancer Paternal Aunt   . Esophageal cancer Neg Hx   . Rectal cancer Neg Hx     Social History Social History   Tobacco Use  . Smoking status: Former Smoker    Years: 3.00    Last attempt to quit: 07/12/1984    Years since quitting: 33.6  . Smokeless tobacco: Former Systems developer    Types: Snuff  . Tobacco comment: "EXPERIMENTED" W/SMOKING--NOT EVEN A PACK X 1 WEEK.   Substance Use Topics  . Alcohol use: Yes    Alcohol/week: 3.0 standard drinks    Types: 3 Shots of liquor per week    Comment: 1-2 drinks per week  . Drug use: No     Allergies   Patient has no  known allergies.   Review of Systems Review of Systems  Constitutional: Positive for chills. Negative for fever.  Respiratory: Negative for shortness of breath.   Cardiovascular: Negative for chest pain.  Gastrointestinal: Positive for abdominal pain, nausea and vomiting. Negative for anal bleeding, blood in stool, constipation and diarrhea.  Musculoskeletal: Positive for arthralgias.  Neurological: Positive for dizziness, light-headedness and headaches. Negative for syncope, facial asymmetry, speech difficulty, weakness and numbness.  All other systems reviewed and are negative.    Physical Exam Updated Vital Signs BP (!) 177/92 (BP Location: Left Arm)   Pulse 83   Temp 98.8 F (37.1 C) (Oral)   Resp 16   SpO2 100%   Physical Exam  Constitutional: He appears well-developed and well-nourished.  Non-toxic appearance. No distress.  HENT:  Head: Normocephalic and atraumatic.  Eyes: Pupils are equal, round, and reactive to light. Conjunctivae and EOM are normal. Right eye exhibits no discharge. Left eye exhibits no discharge.  Neck: Normal range of motion. Neck supple. No neck rigidity. No edema and no erythema present.  Cardiovascular: Normal rate, regular rhythm and intact distal pulses.  Pulmonary/Chest: Effort normal and breath sounds normal. No respiratory distress. He has no wheezes. He has no rhonchi. He has no rales.  Respiration even and unlabored  Abdominal: Soft. He exhibits no distension. There is tenderness (mild) in the epigastric area. There is no rigidity, no rebound, no guarding, no tenderness at McBurney's point and negative Murphy's sign.  Genitourinary:  Genitourinary Comments: deferred  Neurological: He is alert.  Clear speech. CN III-XII grossly intact. Sensation grossly intact x 4. 5/5 symmetric grip strength. 5/5 strength with plantar/dorsiflexion bilaterally. Normal finger to nose. Negative pronator drift. Negative romberg. Ambulatory with steady gait.     Skin: Skin is warm and dry. No rash noted.  Psychiatric: He has a normal mood and affect. His behavior is normal.  Nursing note and vitals reviewed.    ED Treatments / Results  Labs (all labs ordered are listed, but only abnormal results are displayed) Labs Reviewed  URINALYSIS, ROUTINE W REFLEX MICROSCOPIC - Abnormal; Notable for the following components:      Result Value   Color, Urine STRAW (*)    All other components within normal limits  COMPREHENSIVE METABOLIC PANEL - Abnormal; Notable for the following components:   Glucose, Bld 124 (*)    AST 79 (*)    ALT 52 (*)    Total Bilirubin 1.4 (*)    All other components within normal limits  CBC  LIPASE, BLOOD  ROCKY MTN SPOTTED FVR ABS PNL(IGG+IGM)    EKG None  Radiology Ct Head Wo Contrast  Result Date: 03/15/2018 CLINICAL DATA:  Acute headache.  Hypertension. EXAM: CT HEAD WITHOUT CONTRAST TECHNIQUE: Contiguous axial images were obtained from the base of the skull through the vertex without intravenous contrast. COMPARISON:  None. FINDINGS: Brain: There is no mass, hemorrhage or extra-axial collection. The size and configuration of the ventricles and extra-axial CSF spaces are normal. There is no acute or chronic infarction. The brain parenchyma is normal. Vascular: No abnormal hyperdensity of the major intracranial arteries or dural venous sinuses. No intracranial atherosclerosis. Skull: The visualized skull base, calvarium and extracranial soft tissues are normal. Sinuses/Orbits: No fluid levels or advanced mucosal thickening of the visualized paranasal sinuses. No mastoid or middle ear effusion. The orbits are normal. IMPRESSION: Normal head CT. Electronically Signed   By: Ulyses Jarred M.D.   On: 03/15/2018 16:22    Procedures Procedures (including critical care time)  Medications Ordered in ED Medications  amLODipine (NORVASC) tablet 5 mg (5 mg Oral Given 03/15/18 1711)    Initial Impression / Assessment and Plan /  ED Course  I have reviewed the triage vital signs and the nursing notes.  Pertinent labs & imaging results that were available during my care of the patient were reviewed by me and considered in my medical decision making (see chart for details).  Patient presents to the ED with concerns for HTN, currently with headache, remaining sxs from PCP visit yesterday have resolved other than abdominal discomfort which is consistent with his usual pain related to gastritis. Patient is nontoxic appearing, resting comfortably, vitals WNL other than elevated BP with systolic running 202R-427C, BP seems to be worse when health care staff is at bedside as  opposed to when taken automatically. He has an overall benign physical exam, mild epigastric tenderness consistent with what he reports has been present with gastritis, no peritoneal signs. He has no focal neurologic deficits.   Labs per triage have been reviewed: No leukocytosis. No anemia. Renal function preserved. No significant electrolyte disturbances. Mild hyperglycemia at 124. Mild elevation in LFTs and total bilirubin- will require PCP recheck. Lipase WNL.   Given elevated BP with mild headache will further evaluate with CT head wo contrast. Patient without sudden onset/thunder clap headache to raise concern for [facility]. No fever or nuchal rigidity to raise concern for meningitis. No unilateral vision changes to raise concern for acute angle closure glaucoma and or giant cell arteritis. CT head negative, with lack of neurologic deficits doubt ischemic/hemorrhagic CVA. Patient's BP is elevated, however given negative CT, preserved renal function, lack of CP, doubt HTN emergency. He has had elevated BP reading with prior office visits on chart review since the beginning of 2019. I feel that starting him on an anti-hypertensive medication given he has a primary care provider to follow up with is reasonable.  Per J[facility] 8 guidelines will start Norvasc, first dose given in  the ER.   Regarding his tick exposure with array of sxs, somewhat concerning for RMSF. There is no concerning rash and he is afebrile. His LFTs are elevated, he is not hyponatremic or thrombocytopenic.  Given overall picture feel it is reasonable to place patient on doxycycline.   Discussed initiation of Norvasc and Doxycyline with patient as well has his daughter via phone at his request at length. I discussed results, treatment plan, need for PCP follow-up, and return precautions. Provided opportunity for questions, patient and his daugther confirmed understanding and are in agreement with plan.   Findings and plan of care discussed with supervising physician Dr. Tyrone Nine who is in agreement.    Final Clinical Impressions(s) / ED Diagnoses   Final diagnoses:  Hypertension, unspecified type  Tick bite, subsequent encounter    ED Discharge Orders         Ordered    amLODipine (NORVASC) 5 MG tablet  Daily     03/15/18 1702    doxycycline (VIBRAMYCIN) 100 MG capsule  2 times daily     03/15/18 1702           Niraj Kudrna, Roscoe R, PA-C 03/16/18 Damascus, Dan, DO 03/16/18 1616

## 2018-03-15 NOTE — Discharge Instructions (Addendum)
You were seen in the emergency department today for elevated blood pressure.  Your blood pressure is elevated in the emergency department.  Your labs were overall reassuring, your liver function tests are bit elevated, we suspect this may be due to a tickborne illness, but it can be caused by other things.  Your CT scan was normal.  We are starting you on 2 new medications today: -Norvasc (amlodipine)-this is a blood pressure medication, we would like you to take this daily, we gave your first dose in the emergency department. -Doxycycline-this is an antibiotic to treat for possible [facility] spotted fever, take this once in the morning and once in the evening for the next 7 days.  Take this with food as it can cause stomach upset.  Drink a full glass of water when taking this antibiotic.  We have prescribed you new medication(s) today. Discuss the medications prescribed today with your pharmacist as they can have adverse effects and interactions with your other medicines including over the counter and prescribed medications. Seek medical evaluation if you start to experience new or abnormal symptoms after taking one of these medicines, seek care immediately if you start to experience difficulty breathing, feeling of your throat closing, facial swelling, or rash as these could be indications of a more serious allergic reaction  We would like you to follow-up closely with your primary care provider within 3 to 5 days for reevaluation of your symptoms as well as a recheck of your blood pressure.  Your primary care provider may want to adjust the blood pressure medication, discontinue it, or start you on an alternative medicine.  Please return to the ER for new or worsening symptoms including but not limited to fever, inability to keep fluids down, change in vision, worsening headache, chest pain, trouble breathing, numbness, weakness, problems speaking, facial asymmetry, or any other concerns that you may  have.

## 2018-03-15 NOTE — Telephone Encounter (Signed)
Pt called c/o high blood pressure and dizziness. BP readings are as follows: 183/119, 169/109, 191/132. Pt has no h/o high blood pressure but in the office notes dated  03/14/18 pt's BP was 154/96. Pt c/o dizziness, episodes yesterday of blurred vision and right ar, aching and stiff neck.  Care advice given per ED disposition. Pt plans to go to [facility] ED.   Reason for Disposition . [3] Systolic BP  >= 664 OR Diastolic >= 403 AND [4] cardiac or neurologic symptoms (e.g., chest pain, difficulty breathing, unsteady gait, blurred vision)  Answer Assessment - Initial Assessment Questions 1. BLOOD PRESSURE: "What is the blood pressure?" "Did you take at least two measurements 5 minutes apart?"     183/119; 169/109 laying in bed; 191/132 2. ONSET: "When did you take your blood pressure?"     11:30 11:35 11:40 3. HOW: "How did you obtain the blood pressure?" (e.g., visiting nurse, automatic home BP monitor)     Wrist BP machine 4. HISTORY: "Do you have a history of high blood pressure?"     no 5. MEDICATIONS: "Are you taking any medications for blood pressure?" "Have you missed any doses recently?"     no 6. OTHER SYMPTOMS: "Do you have any symptoms?" (e.g., headache, chest pain, blurred vision, difficulty breathing, weakness)     Dizziness, blurred vision yesterday, right arm aching and stiff neck  7. PREGNANCY: "Is there any chance you are pregnant?" "When was your last menstrual period?"     n/a  Protocols used: HIGH BLOOD PRESSURE-A-AH

## 2018-03-15 NOTE — ED Provider Notes (Signed)
Patient placed in [facility] pathway, seen and evaluated   Chief Complaint: HTN  HPI:   52 y/o male with a h/o of asthma and GERD presenting with a chief complaint of HTN.  His blood pressure was 183/120 when he checked it at 10 AM.    No h/o of HTN, but previously had problems with high BP when he was taking Symbicort, which has since been changed to Flovent.  He has been keeping a log of his blood pressure and checking it 1-2 times a week.  Systolic normally runs in the 120s to 130s.  He reports that he was seen at urgent care yesterday because he was unable to get an appointment with his PCP.  He states that he found an engorged tick on him over the weekend.  Yesterday, he presented to urgent care with general malaise, joint pain that was diffuse but more pronounced in his right elbow and ankles, bilateral blurred vision, and headache.  He treated his headache at home with Tylenol with no improvement.  No history of chronic headaches.  He had one episode of nonbloody, nonbilious emesis yesterday along with his other symptoms.  He also reports a history of almost daily vomiting since 2017.   Denies chest pain, dyspnea, diplopia, or decreased urine output.  ROS: Headache, Dizziness, blurred vision, joint pain, emesis, epigastric pain, malaise   Physical Exam:   Gen: No distress  Neuro: Awake and Alert  Skin: Warm    Focused Exam: Pulpils are equal round and reactive.  EOMs are intact.  Lungs are clear to all station bilaterally.   Initiation of care has begun. The patient has been counseled on the process, plan, and necessity for staying for the completion/evaluation, and the remainder of the medical screening examination    Joanne Gavel, PA-C 03/15/18 1333    Virgel Manifold, MD 03/17/18 1035

## 2018-03-15 NOTE — ED Triage Notes (Addendum)
Patient to ED c/o hypertension (no history of) - states he was at the doctor yesterday for possible lyme disease (given oral antibiotics) and told his BP was high (417B systolic). Patient checked BP at home this morning and got two readings above 160/110. Patient denies headache, vision changes.

## 2018-03-17 LAB — RMSF, IGG, IFA: RMSF, IGG, IFA: 1:64 {titer} — ABNORMAL HIGH

## 2018-03-17 LAB — ROCKY MTN SPOTTED FVR ABS PNL(IGG+IGM)
RMSF IgG: POSITIVE — AB
RMSF IgM: 0.61 index (ref 0.00–0.89)

## 2018-03-20 ENCOUNTER — Ambulatory Visit: Payer: Commercial Managed Care - PPO | Admitting: Family Medicine

## 2018-03-20 ENCOUNTER — Encounter: Payer: Self-pay | Admitting: Family Medicine

## 2018-03-20 VITALS — BP 120/78 | HR 81 | Temp 98.4°F | Ht 71.5 in | Wt 249.2 lb

## 2018-03-20 DIAGNOSIS — I10 Essential (primary) hypertension: Secondary | ICD-10-CM | POA: Insufficient documentation

## 2018-03-20 DIAGNOSIS — A770 Spotted fever due to Rickettsia rickettsii: Secondary | ICD-10-CM

## 2018-03-20 MED ORDER — AMLODIPINE BESYLATE 5 MG PO TABS: 5.0000 mg | ORAL_TABLET | Freq: Every day | ORAL | 1 refills | Status: DC

## 2018-03-20 NOTE — Progress Notes (Signed)
Dr. Frederico Hamman T. Dalilah Curlin, MD, [facility] [place], [address] Phone: [phone] Fax: [phone]  03/20/2018  Patient: Clarence Alvarez, MRN: [id number], DOB: [birth year], 52 y.o.  Primary Physician:  Owens Loffler, MD   Chief Complaint  Patient presents with  . Hospitalization Follow-up    HTN & Tick Bite   Subjective:   Clarence Alvarez is a 52 y.o. very pleasant male patient who presents with the following:  RMSF, IgG proven with negative IgM  F/u hospital, BP and tick bite. Could not keep it together, kept getting cold. Neck was killing him and had a lot of abdominal pains. Woke up and was feeling really bad, knees, elbows, stiff.   On doxy  HTN, acutely was very high, 200/105 range Started on norvasc 5 mg  Somewhat high with wrist BP cuff  Past Medical History, Surgical History, Social History, Family History, Problem List, Medications, and Allergies have been reviewed and updated if relevant.  Patient Active Problem List   Diagnosis Date Noted  . Lumbar pain with radiation down right leg 12/23/2016  . OSA (obstructive sleep apnea) 01/26/2013  . ALLERGIC RHINITIS, SEASONAL 09/15/2009  . Asthma 09/08/2006    Past Medical History:  Diagnosis Date  . Allergic rhinitis, seasonal   . History of adenomatous polyp of colon    03-25-2014  tubular adenoma's /  hyperplastic  . History of prostatitis   . Left ureteral stone   . Mild obstructive sleep apnea    per study 2014  . Moderate persistent asthma    followed by pcp-- uncomplicated    Past Surgical History:  Procedure Laterality Date  . anterior cervucal    . COLONOSCOPY  03/25/2014  . ESOPHAGOGASTRODUODENOSCOPY (EGD) WITH PROPOFOL N/A 02/24/2018   Procedure: ESOPHAGOGASTRODUODENOSCOPY (EGD) WITH PROPOFOL;  Surgeon: Jonathon Bellows, MD;  Location: [facility] ENDOSCOPY;  Service: Gastroenterology;  Laterality: N/A;  . SHOULDER ARTHROSCOPY WITH ROTATOR CUFF  REPAIR AND SUBACROMIAL DECOMPRESSION Left 07-24-2010  dr Noemi Chapel @  [facility]   and Labrum tear debridement    Social History   Socioeconomic History  . Marital status: Divorced    Spouse name: Not on file  . Number of children: Not on file  . Years of education: Not on file  . Highest education level: Not on file  Occupational History  . 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 Financial resource strain: Not on file  . Food insecurity:    Worry: Not on file    Inability: Not on file  . Transportation needs:    Medical: Not on file    Non-medical: Not on file  Tobacco Use  . Smoking status: Former Smoker    Years: 3.00    Last attempt to quit: 07/12/1984    Years since quitting: 33.7  . Smokeless tobacco: Former Systems developer    Types: Snuff  . Tobacco comment: "EXPERIMENTED" W/SMOKING--NOT EVEN A PACK X 1 WEEK.   Substance and Sexual Activity  . Alcohol use: Yes    Alcohol/week: 3.0 standard drinks    Types: 3 Shots of liquor per week    Comment: 1-2 drinks per week  . Drug use: No  . Sexual activity: Not on file    Comment: vasectomy-- 2010  Lifestyle  . Physical activity:    Days per week: Not on file  Minutes per session: Not on file  . Stress: Not on file  Relationships  . Social connections:    Talks on phone: Not on file    Gets together: Not on file    Attends religious service: Not on file    Active member of club or organization: Not on file    Attends meetings of clubs or organizations: Not on file    Relationship status: Not on file  . Intimate partner violence:    Fear of current or ex partner: Not on file    Emotionally abused: Not on file    Physically abused: Not on file    Forced sexual activity: Not on file  Other Topics Concern  . Not on file  Social History Narrative  . Not on file    Family History  Problem Relation Age of Onset  . Diabetes Maternal  Grandmother   . Colon cancer Maternal Grandmother 87  . Diabetes Mother   . Hepatitis C Mother   . Prostate cancer Father   . Diabetes Paternal Grandmother   . Mental illness Brother   . Mental illness Sister   . Mental illness Daughter   . Stomach cancer Maternal Grandfather   . COPD Other        Aunt  . Diabetes Other        siblings  . Pancreatic cancer Paternal Aunt   . Esophageal cancer Neg Hx   . Rectal cancer Neg Hx     No Known Allergies  Medication list reviewed and updated in full in [place].   GEN: No acute illnesses, no fevers, chills. GI: No n/v/d, eating normally Pulm: No SOB Interactive and getting along well at home.  Otherwise, ROS is as per the HPI.  Objective:   BP 120/78   Pulse 81   Temp 98.4 F (36.9 C) (Oral)   Ht 5' 11.5" (1.816 m)   Wt 249 lb 4 oz (113.1 kg)   BMI 34.28 kg/m   GEN: WDWN, NAD, Non-toxic, A & O x 3 HEENT: Atraumatic, Normocephalic. Neck supple. No masses, No LAD. Ears and Nose: No external deformity. CV: RRR, No M/G/R. No JVD. No thrill. No extra heart sounds. PULM: CTA B, no wheezes, crackles, rhonchi. No retractions. No resp. distress. No accessory muscle use. EXTR: No c/c/e NEURO Normal gait.  PSYCH: Normally interactive. Conversant. Not depressed or anxious appearing.  Calm demeanor.   Laboratory and Imaging Data: Results for orders placed or performed during the hospital encounter of 03/15/18  Urinalysis, Routine w reflex microscopic  Result Value Ref Range   Color, Urine STRAW (A) YELLOW   APPearance CLEAR CLEAR   Specific Gravity, Urine 1.006 1.005 - 1.030   pH 7.0 5.0 - 8.0   Glucose, UA NEGATIVE NEGATIVE mg/dL   Hgb urine dipstick NEGATIVE NEGATIVE   Bilirubin Urine NEGATIVE NEGATIVE   Ketones, ur NEGATIVE NEGATIVE mg/dL   Protein, ur NEGATIVE NEGATIVE mg/dL   Nitrite NEGATIVE NEGATIVE   Leukocytes, UA NEGATIVE NEGATIVE  Rocky mtn spotted fvr abs pnl(IgG+IgM)  Result Value Ref Range   RMSF  IgG Positive (A) Negative   RMSF IgM 0.61 0.00 - 0.89 index  CBC  Result Value Ref Range   WBC 4.0 4.0 - 10.5 K/uL   RBC 5.25 4.22 - 5.81 MIL/uL   Hemoglobin 16.2 13.0 - 17.0 g/dL   HCT 48.6 39.0 - 52.0 %   MCV 92.6 78.0 - 100.0 fL   MCH 30.9 26.0 -  34.0 pg   MCHC 33.3 30.0 - 36.0 g/dL   RDW 12.8 11.5 - 15.5 %   Platelets 173 150 - 400 K/uL  Comprehensive metabolic panel  Result Value Ref Range   Sodium 137 135 - 145 mmol/L   Potassium 3.7 3.5 - 5.1 mmol/L   Chloride 100 98 - 111 mmol/L   CO2 28 22 - 32 mmol/L   Glucose, Bld 124 (H) 70 - 99 mg/dL   BUN 14 6 - 20 mg/dL   Creatinine, Ser 1.22 0.61 - 1.24 mg/dL   Calcium 9.2 8.9 - 10.3 mg/dL   Total Protein 7.2 6.5 - 8.1 g/dL   Albumin 4.0 3.5 - 5.0 g/dL   AST 79 (H) 15 - 41 U/L   ALT 52 (H) 0 - 44 U/L   Alkaline Phosphatase 68 38 - 126 U/L   Total Bilirubin 1.4 (H) 0.3 - 1.2 mg/dL   GFR calc non Af Amer >60 >60 mL/min   GFR calc Af Amer >60 >60 mL/min   Anion gap 9 5 - 15  Lipase, blood  Result Value Ref Range   Lipase 26 11 - 51 U/L  RMSF, IgG, IFA  Result Value Ref Range   RMSF, IGG, IFA 1:64 (H) Neg <1:64    Ct Head Wo Contrast  Result Date: 03/15/2018 CLINICAL DATA:  Acute headache.  Hypertension. EXAM: CT HEAD WITHOUT CONTRAST TECHNIQUE: Contiguous axial images were obtained from the base of the skull through the vertex without intravenous contrast. COMPARISON:  None. FINDINGS: Brain: There is no mass, hemorrhage or extra-axial collection. The size and configuration of the ventricles and extra-axial CSF spaces are normal. There is no acute or chronic infarction. The brain parenchyma is normal. Vascular: No abnormal hyperdensity of the major intracranial arteries or dural venous sinuses. No intracranial atherosclerosis. Skull: The visualized skull base, calvarium and extracranial soft tissues are normal. Sinuses/Orbits: No fluid levels or advanced mucosal thickening of the visualized paranasal sinuses. No mastoid or middle  ear effusion. The orbits are normal. IMPRESSION: Normal head CT. Electronically Signed   By: Ulyses Jarred M.D.   On: 03/15/2018 16:22    Assessment and Plan:   RMSF [facility] spotted fever)  Essential hypertension  Acute RMSF with adequate treatment by doxy  I suspect his acutely high BP partially relate to acute RMSF  BP stable now on norvasc 5 mg  Follow-up: regular CPX only  Meds ordered this encounter  Medications  . amLODipine (NORVASC) 5 MG tablet    Sig: Take 1 tablet (5 mg total) by mouth daily.    Dispense:  90 tablet    Refill:  1   No orders of the defined types were placed in this encounter.   Signed,  Maud Deed. Dhruv Christina, MD   Allergies as of 03/20/2018   No Known Allergies     Medication List        Accurate as of 03/20/18  2:01 PM. Always use your most recent med list.          albuterol 108 (90 Base) MCG/ACT inhaler Commonly known as:  PROVENTIL HFA;VENTOLIN HFA INHALE 2 PUFFS 4 TIMES A DAY   amLODipine 5 MG tablet Commonly known as:  NORVASC Take 1 tablet (5 mg total) by mouth daily.   cetirizine 10 MG tablet Commonly known as:  ZYRTEC Take 10 mg by mouth every morning.   doxycycline 100 MG capsule Commonly known as:  VIBRAMYCIN Take 1 capsule (100 mg total) by mouth  2 (two) times daily.   fluticasone 50 MCG/ACT nasal spray Commonly known as:  FLONASE Place 2 sprays into both nostrils daily.   MEGA MULTIVITAMIN FOR MEN Tabs Take 1 tablet by mouth daily.   montelukast 10 MG tablet Commonly known as:  SINGULAIR Take 1 tablet (10 mg total) by mouth at bedtime.   omeprazole 20 MG capsule Commonly known as:  PRILOSEC Take 1 capsule (20 mg total) by mouth daily.

## 2018-04-10 ENCOUNTER — Other Ambulatory Visit: Payer: Self-pay | Admitting: Family Medicine

## 2018-04-18 NOTE — Progress Notes (Signed)
[facility] Consultation      Assessment and Plan:  Allergic asthma with dyspnea on exertion. - Exacerbated recently by cat exposure.  Cannot take Symbicort because it causes high blood pressure.  He has not been taking his Flovent and Singulair very regularly. - Encouraged to use Flovent 2 puffs twice daily, rinse mouth after use, use Singulair every night, encouraged to use these regularly, and every day as using them regularly can help prevent further asthma attacks. - If not improved at next visit will consider checking IgE, Rast. -He has already received a flu shot.  GERD. - Symptomatic GERD, recently had an EGD which showed possible achalasia.  This may be contributing to the patient's of globus sensation in his throat. - Discussed that reflux can contribute to asthma exacerbation, therefore is important to treat this as recommended by gastroenterology.  Currently he is recommended to undergo a esophageal manometry.  Mild obstructive sleep apnea. - HST in 2014 with AHI of 10. - Recommend initiation of CPAP, however patient is not interested in restarting CPAP at this time.   Date: 04/18/2018  MRN# [id number] Clarence Alvarez 07-09-1966    Clarence Alvarez is a 52 y.o. old male seen in consultation for chief complaint of:    Chief Complaint  Patient presents with  . Shortness of Breath    pt d/c Symbicort due to increased blood pressures. He still uses Flovent and Albuterol as needed.  . Allergic Reaction    pt has reaction with cats.  . Cough    occasional with yellow mucus  . Wheezing    occasional with chest tightness.    HPI:  The patient is a 52 year old male with persistent asthma symptoms.  He also has known issues with GERD and obstructive sleep apnea, not currently on CPAP.  At last visit his Flovent was changed to Symbicort, he was asked to continue Singulair.  He however developed elevated blood pressure while on Symbicort, therefore switch  back to Flovent.  He was referred to gastroenterology for further work-up of GERD, as he was complaining of a globus sensation in his throat.  He underwent EGD on 02/24/2018 with which was positive for H. pylori, findings were also suggestive of achalasia.  He was treated for H. Pylori and was recommended to undergo esophageal manometry.   Since then he felt that he did quite well with flovent, however he had an exposure to a cat and this flared up his asthma. He did not know he was allergic to cats before that. He then went back to the symbicort to "get out of duress", and his BP then went up.  He is taking singulair about 3 days per week. He has been taking flovent 2 puffs bid, about 4 or 5 days per week.   **EGD 02/24/2018>> findings suggestive of achalasia, positive for H.pylori.  **Office spirometry 01/03/2018>> FVC is 87% predicted, FEV1 is 76% predicted, ratio is 69%.  This test is suggestive of mild obstruction. **Abs Eosinophil 12/06/17>> 200.  **Chest x-ray 08/14/2017>> imaging personally reviewed, lungs are normal. **Home sleep test 02/24/2013>> patient's weight was 242 pounds, AHI was 10.  Medication:    Current Outpatient Medications:  .  albuterol (PROVENTIL HFA;VENTOLIN HFA) 108 (90 Base) MCG/ACT inhaler, INHALE 2 PUFFS 4 TIMES A DAY, Disp: 8.5 Inhaler, Rfl: 5 .  amLODipine (NORVASC) 5 MG tablet, Take 1 tablet (5 mg total) by mouth daily., Disp: 90 tablet, Rfl: 1 .  cetirizine (ZYRTEC) 10 MG  tablet, Take 10 mg by mouth every morning. , Disp: , Rfl:  .  doxycycline (VIBRAMYCIN) 100 MG capsule, Take 1 capsule (100 mg total) by mouth 2 (two) times daily., Disp: 14 capsule, Rfl: 0 .  FLOVENT HFA 110 MCG/ACT inhaler, TAKE 1 PUFF BY MOUTH TWICE A DAY, Disp: 12 g, Rfl: 5 .  fluticasone (FLONASE) 50 MCG/ACT nasal spray, Place 2 sprays into both nostrils daily., Disp: 16 g, Rfl: 11 .  montelukast (SINGULAIR) 10 MG tablet, Take 1 tablet (10 mg total) by mouth at bedtime., Disp: 30 tablet, Rfl: 3 .   Multiple Vitamins-Minerals (MEGA MULTIVITAMIN FOR MEN) TABS, Take 1 tablet by mouth daily., Disp: , Rfl:  .  omeprazole (PRILOSEC) 20 MG capsule, Take 1 capsule (20 mg total) by mouth daily., Disp: 60 capsule, Rfl: 1   Allergies:  Patient has no known allergies.  Review of Systems:  Constitutional: Feels well. Cardiovascular: Denies chest pain, exertional chest pain.  Pulmonary: Denies hemoptysis, pleuritic chest pain.   The remainder of systems were reviewed and were found to be negative other than what is documented in the HPI.    Physical Examination:   VS: BP 136/90 (BP Location: Left Arm, Cuff Size: Large)   Pulse 64   Resp 16   Ht 5' 11.5" (1.816 m)   Wt 253 lb (114.8 kg)   SpO2 98%   BMI 34.79 kg/m   General Appearance: No distress  Neuro:without focal findings, mental status, speech normal, alert and oriented HEENT: PERRLA, EOM intact Pulmonary: No wheezing, No rales  CardiovascularNormal S1,S2.  No m/r/g.  Abdomen: Benign, Soft, non-tender, No masses Renal:  No costovertebral tenderness  GU:  No performed at this time. Endoc: No evident thyromegaly, no signs of acromegaly or Cushing features Skin:   warm, no rashes, no ecchymosis  Extremities: normal, no cyanosis, clubbing.      LABORATORY PANEL:   CBC No results for input(s): WBC, HGB, HCT, PLT in the last 168 hours. ------------------------------------------------------------------------------------------------------------------  Chemistries  No results for input(s): NA, K, CL, CO2, GLUCOSE, BUN, CREATININE, CALCIUM, MG, AST, ALT, ALKPHOS, BILITOT in the last 168 hours.  Invalid input(s): GFRCGP ------------------------------------------------------------------------------------------------------------------  Cardiac Enzymes No results for input(s): TROPONINI in the last 168 hours. ------------------------------------------------------------  RADIOLOGY:  No results found.     Thank  you for the  consultation and for allowing [facility] to assist in the care of your patient. Our recommendations are noted above.  Please contact us if we can be of further service.   Marda Stalker, M.D., F.C.C.P.  Board Certified in Internal Medicine, Pulmonary Medicine, [place], and Sleep Medicine.  [facility] Office Number: [phone]   04/18/2018

## 2018-04-19 ENCOUNTER — Encounter: Payer: Self-pay | Admitting: Internal Medicine

## 2018-04-19 ENCOUNTER — Ambulatory Visit: Payer: Commercial Managed Care - PPO | Admitting: Internal Medicine

## 2018-04-19 ENCOUNTER — Ambulatory Visit (INDEPENDENT_AMBULATORY_CARE_PROVIDER_SITE_OTHER): Payer: Commercial Managed Care - PPO | Admitting: Gastroenterology

## 2018-04-19 VITALS — BP 149/85 | HR 72 | Ht 71.5 in | Wt 253.4 lb

## 2018-04-19 VITALS — BP 136/90 | HR 64 | Resp 16 | Ht 71.5 in | Wt 253.0 lb

## 2018-04-19 DIAGNOSIS — B9681 Helicobacter pylori [H. pylori] as the cause of diseases classified elsewhere: Secondary | ICD-10-CM | POA: Diagnosis not present

## 2018-04-19 DIAGNOSIS — R0602 Shortness of breath: Secondary | ICD-10-CM

## 2018-04-19 DIAGNOSIS — K2970 Gastritis, unspecified, without bleeding: Secondary | ICD-10-CM | POA: Diagnosis not present

## 2018-04-19 DIAGNOSIS — R1310 Dysphagia, unspecified: Secondary | ICD-10-CM | POA: Diagnosis not present

## 2018-04-19 NOTE — Progress Notes (Signed)
Jonathon Bellows MD, MRCP(U.K) [address]  [place]  [place],  [address]  Main: [phone]  Fax: [phone]   Primary Care Physician: Owens Loffler, MD  Primary Gastroenterologist:  Dr. Jonathon Bellows   No chief complaint on file.   HPI: Clarence Alvarez is a 52 y.o. male    Summary of history :  She is here today to follow up for GERD, dyspagia and globus. H/o asthma. GERD > 15 years   Interval history   02/14/2018-  04/19/2018  02/24/18: EGD: A hypertonic lower esophageal sphincter was found. There was moderate resistance to endoscope advancement into the stomach. The Z-line was regular. The gastroesophageal junction and cardia were normal on retroflexed view. Biopsies were obtained from the proximal and distal esophagus with cold forceps for histology of suspected eosinophilic esophagitis.  Gastric biopsies showed H pylori gastritis , features of reflux esophagitis seen .   Since last visit no issues swallowing . Finished antibiotics for H pylori - completed > 4 weeks back. At the same time was bitten by a tick and was diagnosed with [place] fever . Had unexplained abdominal pain , treated with doxycycline - feeling better. .  Some occasional issues with swallowing at ttimes , thinks he may have cut down on his food due to issues with swallowing over the years.   Current Outpatient Medications  Medication Sig Dispense Refill  . albuterol (PROVENTIL HFA;VENTOLIN HFA) 108 (90 Base) MCG/ACT inhaler INHALE 2 PUFFS 4 TIMES A DAY 8.5 Inhaler 5  . amLODipine (NORVASC) 5 MG tablet Take 1 tablet (5 mg total) by mouth daily. 90 tablet 1  . cetirizine (ZYRTEC) 10 MG tablet Take 10 mg by mouth every morning.     Marland Kitchen doxycycline (VIBRAMYCIN) 100 MG capsule Take 1 capsule (100 mg total) by mouth 2 (two) times daily. 14 capsule 0  . FLOVENT HFA 110 MCG/ACT inhaler TAKE 1 PUFF BY MOUTH TWICE A DAY 12 g 5  . fluticasone (FLONASE) 50 MCG/ACT nasal spray Place 2 sprays into  both nostrils daily. 16 g 11  . montelukast (SINGULAIR) 10 MG tablet Take 1 tablet (10 mg total) by mouth at bedtime. 30 tablet 3  . Multiple Vitamins-Minerals (MEGA MULTIVITAMIN FOR MEN) TABS Take 1 tablet by mouth daily.    Marland Kitchen omeprazole (PRILOSEC) 20 MG capsule Take 1 capsule (20 mg total) by mouth daily. 60 capsule 1   No current facility-administered medications for this visit.     Allergies as of 04/19/2018  . (No Known Allergies)    ROS:  General: Negative for anorexia, weight loss, fever, chills, fatigue, weakness. ENT: Negative for hoarseness, difficulty swallowing , nasal congestion. CV: Negative for chest pain, angina, palpitations, dyspnea on exertion, peripheral edema.  Respiratory: Negative for dyspnea at rest, dyspnea on exertion, cough, sputum, wheezing.  GI: See history of present illness. GU:  Negative for dysuria, hematuria, urinary incontinence, urinary frequency, nocturnal urination.  Endo: Negative for unusual weight change.    Physical Examination:   There were no vitals taken for this visit.  General: Well-nourished, well-developed in no acute distress.  Eyes: No icterus. Conjunctivae pink. Mouth: Oropharyngeal mucosa moist and pink , no lesions erythema or exudate. Lungs: Clear to auscultation bilaterally. Non-labored. Heart: Regular rate and rhythm, no murmurs rubs or gallops.  Abdomen: Bowel sounds are normal, nontender, nondistended, no hepatosplenomegaly or masses, no abdominal bruits or hernia , no rebound or guarding.   Extremities: No lower extremity edema. No clubbing or deformities.  Neuro: Alert and oriented x 3.  Grossly intact. Skin: Warm and dry, no jaundice.   Psych: Alert and cooperative, normal mood and affect.   Imaging Studies: No results found.  Assessment and Plan:   Clarence Alvarez is a 52 y.o. y/o male here to follow up for H pylori gastritis S/p testing as well as for dysphagia. EGD showed hypertonic LES.   Plan  1 . Breath  test to check for H pylori eradication  2. Esophageal manometry and Ph testing to evaluate for achalasia.     Dr Jonathon Bellows  MD,MRCP [facility]) Follow up in 4-5 weeks

## 2018-04-19 NOTE — Patient Instructions (Addendum)
Try to use flovent 2 puffs twice daily every day.  Continue singulair every night.   The two of these together can help prevent asthma attacks.

## 2018-04-21 ENCOUNTER — Other Ambulatory Visit: Payer: Self-pay

## 2018-04-21 DIAGNOSIS — R1310 Dysphagia, unspecified: Secondary | ICD-10-CM

## 2018-04-21 DIAGNOSIS — K219 Gastro-esophageal reflux disease without esophagitis: Secondary | ICD-10-CM

## 2018-05-04 ENCOUNTER — Encounter
Admission: RE | Disposition: A | Discharge status: Home / Self Care | Payer: Self-pay | Source: Ambulatory Visit | Attending: Gastroenterology

## 2018-05-04 ENCOUNTER — Ambulatory Visit
Admission: RE | Admit: 2018-05-04 | Discharge: 2018-05-04 | Disposition: A | Discharge status: Home / Self Care | Payer: Commercial Managed Care - PPO | Source: Ambulatory Visit | Attending: Gastroenterology | Admitting: Gastroenterology

## 2018-05-04 ENCOUNTER — Encounter: Payer: Self-pay | Admitting: *Deleted

## 2018-05-04 ENCOUNTER — Encounter: Payer: Self-pay | Admitting: Anesthesiology

## 2018-05-04 DIAGNOSIS — R1310 Dysphagia, unspecified: Secondary | ICD-10-CM | POA: Insufficient documentation

## 2018-05-04 DIAGNOSIS — R079 Chest pain, unspecified: Secondary | ICD-10-CM | POA: Diagnosis not present

## 2018-05-04 DIAGNOSIS — R0789 Other chest pain: Secondary | ICD-10-CM

## 2018-05-04 DIAGNOSIS — R12 Heartburn: Secondary | ICD-10-CM

## 2018-05-04 HISTORY — PX: 24 HOUR PH STUDY: SHX5419

## 2018-05-04 HISTORY — DX: Essential (primary) hypertension: I10

## 2018-05-04 HISTORY — PX: ESOPHAGEAL MANOMETRY: SHX5429

## 2018-05-04 SURGERY — MANOMETRY, ESOPHAGUS

## 2018-05-04 MED ORDER — LIDOCAINE HCL URETHRAL/MUCOSAL 2 % EX GEL
1.0000 "application " | Freq: Once | CUTANEOUS | Status: AC
  Administered 2018-05-04: 1 via TOPICAL

## 2018-05-04 MED ORDER — LIDOCAINE HCL URETHRAL/MUCOSAL 2 % EX GEL
CUTANEOUS | Status: AC
  Administered 2018-05-04: 1 via TOPICAL
  Filled 2018-05-04: qty 5

## 2018-05-04 MED ORDER — BUTAMBEN-TETRACAINE-BENZOCAINE 2-2-14 % EX AERO
INHALATION_SPRAY | CUTANEOUS | Status: AC
  Administered 2018-05-04: 1 via TOPICAL
  Filled 2018-05-04: qty 5

## 2018-05-04 MED ORDER — BUTAMBEN-TETRACAINE-BENZOCAINE 2-2-14 % EX AERO
1.0000 | INHALATION_SPRAY | Freq: Once | CUTANEOUS | Status: AC
  Administered 2018-05-04: 1 via TOPICAL

## 2018-05-04 SURGICAL SUPPLY — 2 items
FACESHIELD LNG OPTICON STERILE (SAFETY) IMPLANT
GLOVE BIO SURGEON STRL SZ8 (GLOVE) ×4 IMPLANT

## 2018-05-09 ENCOUNTER — Encounter: Payer: Self-pay | Admitting: Gastroenterology

## 2018-05-23 ENCOUNTER — Encounter: Payer: Self-pay | Admitting: Gastroenterology

## 2018-06-07 ENCOUNTER — Encounter: Payer: Self-pay | Admitting: Gastroenterology

## 2018-06-07 ENCOUNTER — Ambulatory Visit: Payer: Self-pay | Admitting: Gastroenterology

## 2018-06-07 DIAGNOSIS — R12 Heartburn: Secondary | ICD-10-CM

## 2018-06-22 ENCOUNTER — Other Ambulatory Visit: Payer: Self-pay | Admitting: Gastroenterology

## 2018-07-13 DIAGNOSIS — M25511 Pain in right shoulder: Secondary | ICD-10-CM | POA: Diagnosis not present

## 2018-07-17 ENCOUNTER — Telehealth: Payer: Self-pay

## 2018-07-17 DIAGNOSIS — R1310 Dysphagia, unspecified: Secondary | ICD-10-CM

## 2018-07-17 DIAGNOSIS — K219 Gastro-esophageal reflux disease without esophagitis: Secondary | ICD-10-CM

## 2018-07-17 NOTE — Telephone Encounter (Signed)
Pt said for 2 days having throbbing pain in lower back,neck and head that comes and goes. Pt cannot set time pain last. Pain level is 5 and pt has never had this type pain before. Pt also gets SOB when has pain. Pt said abdomen distended; tremor feeling in lower abd. BP has been elevated recently; pt taking BP med. Last night BP 154/86.No UTI symptoms. Pt has tightness in chest on and off. Pt feels "fuzzy" in head and pt feels "off"the patient is concerned about an aneurysm; pt's father has hx of aneurysm also. Pt is going to [facility] ED for eval.FYI to Dr [name],

## 2018-07-17 NOTE — Telephone Encounter (Signed)
That sounds like a reasonable course of action.

## 2018-07-18 ENCOUNTER — Telehealth: Payer: Self-pay | Admitting: Gastroenterology

## 2018-07-18 NOTE — Telephone Encounter (Signed)
Pt is calling to get refill on rx Ameprizole send to [facility] on [address] in [place] he is scheduled for f/u apt 08/21/18 and has been put on a cancellation list for sooner apt

## 2018-07-19 ENCOUNTER — Other Ambulatory Visit: Payer: Self-pay

## 2018-07-19 MED ORDER — OMEPRAZOLE 20 MG PO CPDR: 20.0000 mg | DELAYED_RELEASE_CAPSULE | Freq: Every day | ORAL | 1 refills | Status: DC

## 2018-07-19 NOTE — Telephone Encounter (Signed)
Mr. Kozlov notified as instructed by telephone.  Patient states understanding.  

## 2018-07-19 NOTE — Telephone Encounter (Addendum)
Spoke with Clarence Alvarez.  He states he did not go to the ER because he thought his symptoms were stress related.  He states he still has the SOB on exertion and when lifting heavy things he becomes lightheaded.  He does not feel it is related to his asthma or GERD.  He states the headache and back pain is better.  Just a little pressure but nothing like it was.  His blood pressure is 149/90 and he is taking his amlodipine daily.  He states he dad was just diagnosis with a AAA and the doctor told him it could be hereditary.  He states he was done some research on AAA and was concerned the symptoms he was having could be related to a aneurysm as well.  He also states he stay bloated all the time.  He states Dr. [name] did a CT which didn't really show anything.  He has made an appointment to see Dr. Vicente Males, GI on 08/21/2018 but would like to be seen in [place] which would be closer for him.  He is asking Dr. [name] to put in referral for GI in [place].

## 2018-07-19 NOTE — Addendum Note (Signed)
Addended by: Owens Loffler on: 07/19/2018 03:28 PM   Modules accepted: Orders

## 2018-07-19 NOTE — Telephone Encounter (Signed)
I put in a referral to [facility] - I am not sure if they will be able to see him faster than his appt with Dr. Vicente Males.  He recently had a CT of the abdomen and pelvis with contrast that did NOT show a AAA or enlargement of his aorta. This should be highly reassuring to him.  We can always follow-up with additional studies in the future, as well.   He brings up a lot of symptoms - it is impossible for me to say it is benign. SOB with exertion can be serious. I am happy to see him at any point.

## 2018-07-19 NOTE — Telephone Encounter (Signed)
Pt want a new referral so he can go to [facility] in [place] because of distance he has to drive. Please advise pt.

## 2018-07-19 NOTE — Telephone Encounter (Signed)
I want to clarify this.  He called a few days ago and was having chest pains and many other problems.   He also has seen Dr. Vicente Males from GI in [place] - I am a little unclear about this message. Is he better from all of the listed problems below?  I can pretty easily send him to [facility], but I wanted to f/u about the many probs he was having and he said he was going to the ER??

## 2018-07-19 NOTE — Telephone Encounter (Signed)
Left message for Clarence Alvarez to return call to speak with me or triage nurse to follow up on his phone call from 07/17/2018.  No record that patient went to ER.  Dr. [name] is requesting clarification.

## 2018-07-19 NOTE — Telephone Encounter (Signed)
Spoke with pt and informed him that the omeprazole refill has been sent to his preferred pharmacy.

## 2018-07-26 ENCOUNTER — Telehealth: Payer: Self-pay | Admitting: Gastroenterology

## 2018-07-26 NOTE — Telephone Encounter (Signed)
Please contact the patient to determine what his concerns are. He transferred his GI care to Dr. Vicente Males.

## 2018-07-26 NOTE — Telephone Encounter (Signed)
Patient has moved back to [place].  He is still having tightness in his chest and bloating.  He has concerns competing the requested h pylori tx because he does not want to come off of his PPI.  He is advised that he should continue his care with Dr. Vicente Males while he is in the middle of his diagnostic testing and complete his workup for this problem with Dr. Vicente Males..  He will call back if he still wishes to transfer at a later date.

## 2018-08-21 ENCOUNTER — Encounter

## 2018-08-21 ENCOUNTER — Encounter: Payer: Self-pay | Admitting: Gastroenterology

## 2018-08-21 ENCOUNTER — Other Ambulatory Visit: Payer: Self-pay

## 2018-08-21 ENCOUNTER — Telehealth: Payer: Self-pay

## 2018-08-21 ENCOUNTER — Ambulatory Visit: Payer: Commercial Managed Care - PPO | Admitting: Gastroenterology

## 2018-08-21 VITALS — BP 129/81 | HR 85 | Ht 71.5 in | Wt 256.2 lb

## 2018-08-21 DIAGNOSIS — K2970 Gastritis, unspecified, without bleeding: Secondary | ICD-10-CM

## 2018-08-21 DIAGNOSIS — R109 Unspecified abdominal pain: Secondary | ICD-10-CM

## 2018-08-21 DIAGNOSIS — B9681 Helicobacter pylori [H. pylori] as the cause of diseases classified elsewhere: Secondary | ICD-10-CM

## 2018-08-21 NOTE — Telephone Encounter (Signed)
ERRO

## 2018-08-21 NOTE — Progress Notes (Signed)
Jonathon Bellows MD, MRCP(U.K) [address]  [place]  [place], [place] [address]  Main: [phone]  Fax: [phone]   Primary Care Physician: Owens Loffler, MD  Primary Gastroenterologist:  Dr. Jonathon Bellows   Chief Complaint  Patient presents with  . Follow-up    GERD, Hpylori    HPI: Clarence Alvarez is a 53 y.o. male   Summary of history :  He is here today to follow up for GERD, dyspagia and globus. H/o asthma. GERD > 15 years  02/24/18: EGD: A hypertonic lower esophageal sphincter was found. There was moderate resistance to endoscope advancement into the stomach. The Z-line was regular. The gastroesophageal junction and cardia were normal on retroflexed view. Biopsies were obtained from the proximal and distal esophagus with cold forceps for histology of suspected eosinophilic esophagitis.  Gastric biopsies showed H pylori gastritis , features of reflux esophagitis seen . Treated  Interval history  04/19/2018-08/21/2018  04/2018 : Esophageal manometry showed elevated IRP but did not meet criteria for achalasia. More of an EGJ outflow obstruction pattern.  Did not obtain H pylori breath test to check for eradication  Ph testing was not performed.   No issues with swallowing . On omeprazole 20 mg once a day . No symptoms.   At present only issue is abdominal cramps and bloating.   No artificial's  sugars .  Regular bowel movements which are soft .  Current Outpatient Medications  Medication Sig Dispense Refill  . albuterol (PROVENTIL HFA;VENTOLIN HFA) 108 (90 Base) MCG/ACT inhaler INHALE 2 PUFFS 4 TIMES A DAY 8.5 Inhaler 5  . amLODipine (NORVASC) 5 MG tablet Take 1 tablet (5 mg total) by mouth daily. 90 tablet 1  . cetirizine (ZYRTEC) 10 MG tablet Take 10 mg by mouth every morning.     Marland Kitchen FLOVENT HFA 110 MCG/ACT inhaler TAKE 1 PUFF BY MOUTH TWICE A DAY 12 g 5  . fluticasone (FLONASE) 50 MCG/ACT nasal spray Place 2 sprays into both nostrils daily. 16 g 11    . omeprazole (PRILOSEC) 20 MG capsule Take 1 capsule (20 mg total) by mouth daily. 90 capsule 1  . montelukast (SINGULAIR) 10 MG tablet Take 1 tablet (10 mg total) by mouth at bedtime. (Patient not taking: Reported on 08/21/2018) 30 tablet 3  . Multiple Vitamins-Minerals (MEGA MULTIVITAMIN FOR MEN) TABS Take 1 tablet by mouth daily.     No current facility-administered medications for this visit.     Allergies as of 08/21/2018  . (No Known Allergies)    ROS:  General: Negative for anorexia, weight loss, fever, chills, fatigue, weakness. ENT: Negative for hoarseness, difficulty swallowing , nasal congestion. CV: Negative for chest pain, angina, palpitations, dyspnea on exertion, peripheral edema.  Respiratory: Negative for dyspnea at rest, dyspnea on exertion, cough, sputum, wheezing.  GI: See history of present illness. GU:  Negative for dysuria, hematuria, urinary incontinence, urinary frequency, nocturnal urination.  Endo: Negative for unusual weight change.    Physical Examination:   BP 129/81   Pulse 85   Ht 5' 11.5" (1.816 m)   Wt 256 lb 3.2 oz (116.2 kg)   BMI 35.23 kg/m   General: Well-nourished, well-developed in no acute distress.  Eyes: No icterus. Conjunctivae pink. Mouth: Oropharyngeal mucosa moist and pink , no lesions erythema or exudate. Lungs: Clear to auscultation bilaterally. Non-labored. Heart: Regular rate and rhythm, no murmurs rubs or gallops.  Abdomen: Bowel sounds are normal, nontender, nondistended, no hepatosplenomegaly or masses, no abdominal bruits  or hernia , no rebound or guarding.   Extremities: No lower extremity edema. No clubbing or deformities. Neuro: Alert and oriented x 3.  Grossly intact. Skin: Warm and dry, no jaundice.   Psych: Alert and cooperative, normal mood and affect.   Imaging Studies: No results found.  Assessment and Plan:   Clarence Alvarez is a 53 y.o. y/o male  here to follow up for H pylori gastritis S/p testing as  well as for dysphagia. EGD showed hypertonic LES. Manometry showed elevated IRP but did not meet criteria for Achalasia . No issues with swallowing presently, only issue is bloating which can be from SIBO, also father had AAA and he is concerned if he could have one.   Plan  1 . Breath test to check for H pylori eradication  2. Low FODMAP diet +/- activated charcoal if negative then will consider testing for SIBO.  3. USG abdomen    Dr Jonathon Bellows  MD,MRCP [facility]) Follow up in 4 months

## 2018-08-23 ENCOUNTER — Encounter: Payer: Self-pay | Admitting: Gastroenterology

## 2018-08-23 LAB — H. PYLORI BREATH TEST: H pylori Breath Test: NEGATIVE

## 2018-08-24 ENCOUNTER — Ambulatory Visit
Admission: RE | Admit: 2018-08-24 | Discharge: 2018-08-24 | Disposition: A | Discharge status: Home / Self Care | Payer: Commercial Managed Care - PPO | Source: Ambulatory Visit | Attending: Gastroenterology | Admitting: Gastroenterology

## 2018-08-24 DIAGNOSIS — R109 Unspecified abdominal pain: Secondary | ICD-10-CM | POA: Diagnosis present

## 2018-08-30 NOTE — Telephone Encounter (Signed)
-----   Message from Jonathon Bellows, MD sent at 08/28/2018 10:39 AM EST ----- Inform USG shows fat in the liver  Provide information on fatty liver

## 2018-08-30 NOTE — Telephone Encounter (Signed)
Spoke with pt and informed him of ultrasound results. Pt is aware that I have sent Fatty Liver pt information via Mychart.

## 2018-09-05 ENCOUNTER — Other Ambulatory Visit: Payer: Self-pay | Admitting: Family Medicine

## 2018-09-15 ENCOUNTER — Telehealth: Payer: Self-pay

## 2018-09-15 NOTE — Telephone Encounter (Signed)
Pt left v/m requesting cb; pt has asthma and pt wants to know if Dr Clarence Alvarez would recommend a pneumonia vaccination for pt. Pt wants to do all preventative measures he can for his personal health.Please advise.

## 2018-09-16 NOTE — Telephone Encounter (Signed)
Yes, he should get a pneumovax-23

## 2018-09-18 NOTE — Telephone Encounter (Signed)
Left message for Clarence Alvarez that he needs a pneumovax 23 per Dr. [name].  I advised he can get that done at his pharmacy or call our office and schedule a nurse visit.

## 2018-10-01 ENCOUNTER — Other Ambulatory Visit: Payer: Self-pay | Admitting: Gastroenterology

## 2018-10-02 ENCOUNTER — Other Ambulatory Visit: Payer: Self-pay | Admitting: Family Medicine

## 2018-10-02 ENCOUNTER — Telehealth: Payer: Self-pay | Admitting: Family Medicine

## 2018-10-02 DIAGNOSIS — Z23 Encounter for immunization: Secondary | ICD-10-CM

## 2018-10-02 MED ORDER — PNEUMOCOCCAL VAC POLYVALENT 25 MCG/0.5ML IJ INJ: 0.5000 mL | INJECTION | Freq: Once | INTRAMUSCULAR | 0 refills | Status: AC

## 2018-10-02 NOTE — Telephone Encounter (Signed)
Patient was told he needs pneumovax 23.  Patient said [facility] told him he needs a rx sent to them before they can give him the shot.  Patient lives in [place] and said it would be more convenient to have the shot given at the pharmacy. Patient wants to know if the shot will have any side effects.

## 2018-10-02 NOTE — Telephone Encounter (Signed)
Rx for Pneumovax 23 vaccine [facility] [address].  Clarence Alvarez notified by telephone that prescription has been sent as requested.  Side Effects discussed with patient (redness or pain at injection site, feeling tired, fever and muscle aches).  I advised that he take a couple of tylenol the night he gets the vaccine to help with side effects.  Patient states understanding.

## 2018-10-14 ENCOUNTER — Other Ambulatory Visit: Payer: Self-pay | Admitting: Family Medicine

## 2018-11-13 ENCOUNTER — Other Ambulatory Visit: Payer: Self-pay

## 2018-11-13 NOTE — Telephone Encounter (Signed)
Spoke with pt and informed him that he has another 90- day refill on the Omeprazole. Pt stated the pharmacy would not fill the medication until our office confirmed the prescription. I have contacted pt pharmacy. Pt is aware that the prescription is now available.

## 2018-11-30 ENCOUNTER — Encounter (HOSPITAL_BASED_OUTPATIENT_CLINIC_OR_DEPARTMENT_OTHER): Payer: Self-pay

## 2018-11-30 ENCOUNTER — Other Ambulatory Visit: Payer: Self-pay

## 2018-11-30 DIAGNOSIS — S46011A Strain of muscle(s) and tendon(s) of the rotator cuff of right shoulder, initial encounter: Secondary | ICD-10-CM | POA: Diagnosis present

## 2018-11-30 HISTORY — DX: Strain of muscle(s) and tendon(s) of the rotator cuff of right shoulder, initial encounter: S46.011A

## 2018-11-30 NOTE — H&P (Signed)
Clarence Alvarez is an 53 y.o. male.   Chief Complaint: right shoulder rotator cuff tear HPI: [name] is a 53 year old who is seen for increasing right shoulder pain. We had injected  his right shoulder on 07-13-18 which only helped him temporarily. We previously injected his right shoulder in 2018 and that only helped temporarily. He underwent a new MRI of the right shoulder  on 11-15-18 which revealed a rotator cuff tear with impingement. He continues to have significant pain. He has pain with overhead use and activity with night pain as well.  We had arthroscoped his left shoulder and performed a left shoulder rotator cuff repair in 2012  and he did well. Medications: Amlodipine, Omeprazole, Zyrtec, Flovent and Albuterol inhaler.   Past Medical History:  Diagnosis Date  . Allergic rhinitis, seasonal   . Arthritis    right knee  . GERD (gastroesophageal reflux disease)   . History of adenomatous polyp of colon    03-25-2014  tubular adenoma's /  hyperplastic  . History of prostatitis   . Hypertension   . Left ureteral stone   . Mild obstructive sleep apnea    per study 2014, pt states he does not have sleep apnea  . Moderate persistent asthma    followed by pcp-- uncomplicated  . Traumatic complete tear of right rotator cuff 11/30/2018    Past Surgical History:  Procedure Laterality Date  . [place] STUDY N/A 05/04/2018   Procedure: [place] STUDY;  Surgeon: Jonathon Bellows, MD;  Location: [facility] ENDOSCOPY;  Service: Gastroenterology;  Laterality: N/A;  . anterior cervucal    . COLONOSCOPY  03/25/2014  . ESOPHAGEAL MANOMETRY N/A 05/04/2018   Procedure: ESOPHAGEAL MANOMETRY (EM);  Surgeon: Jonathon Bellows, MD;  Location: [facility] ENDOSCOPY;  Service: Gastroenterology;  Laterality: N/A;  . ESOPHAGOGASTRODUODENOSCOPY (EGD) WITH PROPOFOL N/A 02/24/2018   Procedure: ESOPHAGOGASTRODUODENOSCOPY (EGD) WITH PROPOFOL;  Surgeon: Jonathon Bellows, MD;  Location: [facility] ENDOSCOPY;  Service: Gastroenterology;   Laterality: N/A;  . KNEE SURGERY  2018  . SHOULDER ARTHROSCOPY WITH ROTATOR CUFF REPAIR AND SUBACROMIAL DECOMPRESSION Left 07-24-2010  dr Noemi Chapel @  [facility]   and Labrum tear debridement    Family History  Problem Relation Age of Onset  . Diabetes Maternal Grandmother   . Colon cancer Maternal Grandmother 87  . Diabetes Mother   . Hepatitis C Mother   . Prostate cancer Father   . Diabetes Paternal Grandmother   . Mental illness Brother   . Mental illness Sister   . Mental illness Daughter   . Stomach cancer Maternal Grandfather   . COPD Other        Aunt  . Diabetes Other        siblings  . Pancreatic cancer Paternal Aunt   . Esophageal cancer Neg Hx   . Rectal cancer Neg Hx    Social History:  reports that he quit smoking about 34 years ago. He quit after 3.00 years of use. He has quit using smokeless tobacco.  His smokeless tobacco use included snuff. He reports current alcohol use of about 3.0 standard drinks of alcohol per week. He reports that he does not use drugs.  Allergies: No Known Allergies  No medications prior to admission.    No results found for this or any previous visit (from the past 48 hour(s)). No results found.  Review of Systems  Constitutional: Negative.   HENT: Negative.   Eyes: Negative.   Respiratory: Negative.   Cardiovascular:  Negative.   Gastrointestinal: Negative.   Genitourinary: Negative.   Musculoskeletal: Positive for joint pain.  Skin: Negative.   Neurological: Negative.   Endo/Heme/Allergies: Negative.   Psychiatric/Behavioral: Negative.     Height 5' 11.5" (1.816 m), weight 108.9 kg. Physical Exam  Constitutional: He appears well-developed and well-nourished.  HENT:  Head: Atraumatic.  Eyes: Pupils are equal, round, and reactive to light. Conjunctivae are normal.  Neck: Neck supple.  Cardiovascular: Normal rate.  Respiratory: Effort normal.  GI: Soft.  Genitourinary:    Genitourinary Comments: Not pertinent to current  symptomatology therefore not examined.      Assessment Principal Problem:   Traumatic complete tear of right rotator cuff    Plan Right shoulder arthroscopy with rotator cuff repair and subacromial decompression distal clavicle excision.  The risks, benefits, and possible complications of the procedure were discussed in detail with the patient.  The patient is without question.  Tatiyana Foucher J Genevieve Arbaugh, PA-C 11/30/2018, 12:01 PM

## 2018-12-05 ENCOUNTER — Other Ambulatory Visit: Payer: Self-pay

## 2018-12-05 ENCOUNTER — Encounter (HOSPITAL_BASED_OUTPATIENT_CLINIC_OR_DEPARTMENT_OTHER)
Admission: RE | Admit: 2018-12-05 | Discharge: 2018-12-05 | Disposition: A | Discharge status: Home / Self Care | Payer: Commercial Managed Care - PPO | Source: Ambulatory Visit | Attending: Orthopedic Surgery | Admitting: Orthopedic Surgery

## 2018-12-05 ENCOUNTER — Other Ambulatory Visit (HOSPITAL_COMMUNITY)
Admission: RE | Admit: 2018-12-05 | Discharge: 2018-12-05 | Disposition: A | Discharge status: Home / Self Care | Payer: Commercial Managed Care - PPO | Source: Ambulatory Visit | Attending: Orthopedic Surgery | Admitting: Orthopedic Surgery

## 2018-12-05 DIAGNOSIS — X58XXXA Exposure to other specified factors, initial encounter: Secondary | ICD-10-CM | POA: Diagnosis not present

## 2018-12-05 DIAGNOSIS — G4733 Obstructive sleep apnea (adult) (pediatric): Secondary | ICD-10-CM | POA: Diagnosis not present

## 2018-12-05 DIAGNOSIS — E669 Obesity, unspecified: Secondary | ICD-10-CM | POA: Diagnosis not present

## 2018-12-05 DIAGNOSIS — Z818 Family history of other mental and behavioral disorders: Secondary | ICD-10-CM | POA: Diagnosis not present

## 2018-12-05 DIAGNOSIS — Z800 Family history of malignant neoplasm of digestive organs: Secondary | ICD-10-CM | POA: Diagnosis not present

## 2018-12-05 DIAGNOSIS — M75121 Complete rotator cuff tear or rupture of right shoulder, not specified as traumatic: Secondary | ICD-10-CM | POA: Diagnosis not present

## 2018-12-05 DIAGNOSIS — Z01812 Encounter for preprocedural laboratory examination: Secondary | ICD-10-CM | POA: Insufficient documentation

## 2018-12-05 DIAGNOSIS — Z825 Family history of asthma and other chronic lower respiratory diseases: Secondary | ICD-10-CM | POA: Diagnosis not present

## 2018-12-05 DIAGNOSIS — Z6836 Body mass index (BMI) 36.0-36.9, adult: Secondary | ICD-10-CM | POA: Diagnosis not present

## 2018-12-05 DIAGNOSIS — Z7951 Long term (current) use of inhaled steroids: Secondary | ICD-10-CM | POA: Diagnosis not present

## 2018-12-05 DIAGNOSIS — Z833 Family history of diabetes mellitus: Secondary | ICD-10-CM | POA: Diagnosis not present

## 2018-12-05 DIAGNOSIS — S46111A Strain of muscle, fascia and tendon of long head of biceps, right arm, initial encounter: Secondary | ICD-10-CM | POA: Diagnosis not present

## 2018-12-05 DIAGNOSIS — Z8042 Family history of malignant neoplasm of prostate: Secondary | ICD-10-CM | POA: Diagnosis not present

## 2018-12-05 DIAGNOSIS — Z79899 Other long term (current) drug therapy: Secondary | ICD-10-CM | POA: Diagnosis not present

## 2018-12-05 DIAGNOSIS — Z1159 Encounter for screening for other viral diseases: Secondary | ICD-10-CM | POA: Diagnosis not present

## 2018-12-05 DIAGNOSIS — Z87442 Personal history of urinary calculi: Secondary | ICD-10-CM | POA: Diagnosis not present

## 2018-12-05 DIAGNOSIS — Z87891 Personal history of nicotine dependence: Secondary | ICD-10-CM | POA: Diagnosis not present

## 2018-12-05 DIAGNOSIS — K219 Gastro-esophageal reflux disease without esophagitis: Secondary | ICD-10-CM | POA: Diagnosis not present

## 2018-12-05 DIAGNOSIS — M7541 Impingement syndrome of right shoulder: Secondary | ICD-10-CM | POA: Diagnosis not present

## 2018-12-05 DIAGNOSIS — J4540 Moderate persistent asthma, uncomplicated: Secondary | ICD-10-CM | POA: Diagnosis not present

## 2018-12-05 DIAGNOSIS — I10 Essential (primary) hypertension: Secondary | ICD-10-CM | POA: Diagnosis not present

## 2018-12-05 LAB — COMPREHENSIVE METABOLIC PANEL
ALT: 62 U/L — ABNORMAL HIGH (ref 0–44)
AST: 69 U/L — ABNORMAL HIGH (ref 15–41)
Albumin: 3.8 g/dL (ref 3.5–5.0)
Alkaline Phosphatase: 72 U/L (ref 38–126)
Anion gap: 9 (ref 5–15)
BUN: 10 mg/dL (ref 6–20)
CO2: 28 mmol/L (ref 22–32)
Calcium: 9.5 mg/dL (ref 8.9–10.3)
Chloride: 103 mmol/L (ref 98–111)
Creatinine, Ser: 1.24 mg/dL (ref 0.61–1.24)
GFR calc Af Amer: >60 mL/min (ref >60)
GFR calc non Af Amer: >60 mL/min (ref >60)
Glucose, Bld: 132 mg/dL — ABNORMAL HIGH (ref 70–99)
Potassium: 3.8 mmol/L (ref 3.5–5.1)
Sodium: 140 mmol/L (ref 135–145)
Total Bilirubin: 0.8 mg/dL (ref 0.3–1.2)
Total Protein: 6.9 g/dL (ref 6.5–8.1)

## 2018-12-05 LAB — CBC
HCT: 45.7 % (ref 39.0–52.0)
Hemoglobin: 15.4 g/dL (ref 13.0–17.0)
MCH: 30.9 pg (ref 26.0–34.0)
MCHC: 33.7 g/dL (ref 30.0–36.0)
MCV: 91.8 fL (ref 80.0–100.0)
Platelets: 178 10*3/uL (ref 150–400)
RBC: 4.98 MIL/uL (ref 4.22–5.81)
RDW: 12.4 % (ref 11.5–15.5)
WBC: 3.5 10*3/uL — ABNORMAL LOW (ref 4.0–10.5)
nRBC: 0 % (ref 0.0–0.2)

## 2018-12-05 LAB — SARS CORONAVIRUS 2 BY RT PCR (HOSPITAL ORDER, PERFORMED IN ~~LOC~~ HOSPITAL LAB): SARS Coronavirus 2: NEGATIVE

## 2018-12-05 NOTE — Progress Notes (Signed)
Ensure pre surgery drink given with instructions to complete by 0430 dos, pt verbalized understanding. 

## 2018-12-06 ENCOUNTER — Ambulatory Visit (HOSPITAL_BASED_OUTPATIENT_CLINIC_OR_DEPARTMENT_OTHER): Payer: Commercial Managed Care - PPO | Admitting: Anesthesiology

## 2018-12-06 ENCOUNTER — Encounter (HOSPITAL_BASED_OUTPATIENT_CLINIC_OR_DEPARTMENT_OTHER): Payer: Self-pay | Admitting: Anesthesiology

## 2018-12-06 ENCOUNTER — Encounter (HOSPITAL_BASED_OUTPATIENT_CLINIC_OR_DEPARTMENT_OTHER)
Admission: AD | Disposition: A | Discharge status: Home / Self Care | Payer: Self-pay | Source: Home / Self Care | Attending: Orthopedic Surgery

## 2018-12-06 ENCOUNTER — Ambulatory Visit (HOSPITAL_BASED_OUTPATIENT_CLINIC_OR_DEPARTMENT_OTHER)
Admission: AD | Admit: 2018-12-06 | Discharge: 2018-12-06 | Disposition: A | Discharge status: Home / Self Care | Payer: Commercial Managed Care - PPO | Attending: Orthopedic Surgery | Admitting: Orthopedic Surgery

## 2018-12-06 DIAGNOSIS — Z800 Family history of malignant neoplasm of digestive organs: Secondary | ICD-10-CM | POA: Insufficient documentation

## 2018-12-06 DIAGNOSIS — S46111A Strain of muscle, fascia and tendon of long head of biceps, right arm, initial encounter: Secondary | ICD-10-CM | POA: Insufficient documentation

## 2018-12-06 DIAGNOSIS — E669 Obesity, unspecified: Secondary | ICD-10-CM | POA: Insufficient documentation

## 2018-12-06 DIAGNOSIS — X58XXXA Exposure to other specified factors, initial encounter: Secondary | ICD-10-CM | POA: Insufficient documentation

## 2018-12-06 DIAGNOSIS — Z825 Family history of asthma and other chronic lower respiratory diseases: Secondary | ICD-10-CM | POA: Insufficient documentation

## 2018-12-06 DIAGNOSIS — M7541 Impingement syndrome of right shoulder: Secondary | ICD-10-CM | POA: Insufficient documentation

## 2018-12-06 DIAGNOSIS — Z6836 Body mass index (BMI) 36.0-36.9, adult: Secondary | ICD-10-CM | POA: Insufficient documentation

## 2018-12-06 DIAGNOSIS — M75121 Complete rotator cuff tear or rupture of right shoulder, not specified as traumatic: Secondary | ICD-10-CM | POA: Insufficient documentation

## 2018-12-06 DIAGNOSIS — K219 Gastro-esophageal reflux disease without esophagitis: Secondary | ICD-10-CM | POA: Insufficient documentation

## 2018-12-06 DIAGNOSIS — Z87442 Personal history of urinary calculi: Secondary | ICD-10-CM | POA: Insufficient documentation

## 2018-12-06 DIAGNOSIS — Z818 Family history of other mental and behavioral disorders: Secondary | ICD-10-CM | POA: Insufficient documentation

## 2018-12-06 DIAGNOSIS — I10 Essential (primary) hypertension: Secondary | ICD-10-CM | POA: Insufficient documentation

## 2018-12-06 DIAGNOSIS — G4733 Obstructive sleep apnea (adult) (pediatric): Secondary | ICD-10-CM | POA: Insufficient documentation

## 2018-12-06 DIAGNOSIS — Z87891 Personal history of nicotine dependence: Secondary | ICD-10-CM | POA: Insufficient documentation

## 2018-12-06 DIAGNOSIS — S46011A Strain of muscle(s) and tendon(s) of the rotator cuff of right shoulder, initial encounter: Secondary | ICD-10-CM | POA: Diagnosis present

## 2018-12-06 DIAGNOSIS — Z833 Family history of diabetes mellitus: Secondary | ICD-10-CM | POA: Insufficient documentation

## 2018-12-06 DIAGNOSIS — J4540 Moderate persistent asthma, uncomplicated: Secondary | ICD-10-CM | POA: Insufficient documentation

## 2018-12-06 DIAGNOSIS — Z79899 Other long term (current) drug therapy: Secondary | ICD-10-CM | POA: Insufficient documentation

## 2018-12-06 DIAGNOSIS — Z8042 Family history of malignant neoplasm of prostate: Secondary | ICD-10-CM | POA: Insufficient documentation

## 2018-12-06 DIAGNOSIS — Z7951 Long term (current) use of inhaled steroids: Secondary | ICD-10-CM | POA: Insufficient documentation

## 2018-12-06 HISTORY — DX: Unspecified osteoarthritis, unspecified site: M19.90

## 2018-12-06 HISTORY — PX: RESECTION DISTAL CLAVICAL: SHX5053

## 2018-12-06 HISTORY — PX: SHOULDER ARTHROSCOPY WITH ROTATOR CUFF REPAIR AND SUBACROMIAL DECOMPRESSION: SHX5686

## 2018-12-06 HISTORY — DX: Gastro-esophageal reflux disease without esophagitis: K21.9

## 2018-12-06 SURGERY — EXCISION, CLAVICLE, DISTAL, OPEN
Anesthesia: General | Site: Shoulder | Laterality: Right

## 2018-12-06 MED ORDER — LACTATED RINGERS IV SOLN
INTRAVENOUS | Status: DC
  Administered 2018-12-06 (×2): via INTRAVENOUS

## 2018-12-06 MED ORDER — FENTANYL CITRATE (PF) 100 MCG/2ML IJ SOLN
INTRAMUSCULAR | Status: AC
  Filled 2018-12-06: qty 2

## 2018-12-06 MED ORDER — SUGAMMADEX SODIUM 500 MG/5ML IV SOLN
INTRAVENOUS | Status: DC | PRN
  Administered 2018-12-06: 300 mg via INTRAVENOUS

## 2018-12-06 MED ORDER — BUPIVACAINE HCL (PF) 0.5 % IJ SOLN
INTRAMUSCULAR | Status: DC | PRN
  Administered 2018-12-06: 15 mL via PERINEURAL

## 2018-12-06 MED ORDER — CEFAZOLIN SODIUM-DEXTROSE 2-4 GM/100ML-% IV SOLN
INTRAVENOUS | Status: AC
  Filled 2018-12-06: qty 100

## 2018-12-06 MED ORDER — OXYCODONE HCL 5 MG PO TABS: 5.0000 mg | ORAL_TABLET | Freq: Once | ORAL | Status: DC | PRN

## 2018-12-06 MED ORDER — DEXAMETHASONE SODIUM PHOSPHATE 4 MG/ML IJ SOLN
INTRAMUSCULAR | Status: DC | PRN
  Administered 2018-12-06: 10 mg via INTRAVENOUS

## 2018-12-06 MED ORDER — POVIDONE-IODINE 10 % EX SWAB: 2.0000 "application " | Freq: Once | CUTANEOUS | Status: DC

## 2018-12-06 MED ORDER — ONDANSETRON HCL 4 MG/2ML IJ SOLN: 4.0000 mg | Freq: Once | INTRAMUSCULAR | Status: DC | PRN

## 2018-12-06 MED ORDER — LACTATED RINGERS IV SOLN
INTRAVENOUS | Status: DC
  Administered 2018-12-06: 10:00:00 via INTRAVENOUS

## 2018-12-06 MED ORDER — MIDAZOLAM HCL 2 MG/2ML IJ SOLN
INTRAMUSCULAR | Status: AC
  Filled 2018-12-06: qty 2

## 2018-12-06 MED ORDER — OXYCODONE HCL 5 MG PO TABS: 5.0000 mg | ORAL_TABLET | ORAL | 0 refills | Status: DC | PRN

## 2018-12-06 MED ORDER — BUPIVACAINE LIPOSOME 1.3 % IJ SUSP
INTRAMUSCULAR | Status: DC | PRN
  Administered 2018-12-06: 10 mL via PERINEURAL

## 2018-12-06 MED ORDER — SCOPOLAMINE 1 MG/3DAYS TD PT72: 1.0000 | MEDICATED_PATCH | Freq: Once | TRANSDERMAL | Status: DC | PRN

## 2018-12-06 MED ORDER — OXYCODONE HCL 5 MG/5ML PO SOLN: 5.0000 mg | Freq: Once | ORAL | Status: DC | PRN

## 2018-12-06 MED ORDER — CEFAZOLIN SODIUM-DEXTROSE 2-4 GM/100ML-% IV SOLN
2.0000 g | INTRAVENOUS | Status: AC
  Administered 2018-12-06 (×2): 2 g via INTRAVENOUS

## 2018-12-06 MED ORDER — SCOPOLAMINE 1 MG/3DAYS TD PT72: 1.0000 | MEDICATED_PATCH | TRANSDERMAL | Status: DC

## 2018-12-06 MED ORDER — CYCLOBENZAPRINE HCL 5 MG PO TABS: 5.0000 mg | ORAL_TABLET | Freq: Three times a day (TID) | ORAL | 0 refills | Status: DC | PRN

## 2018-12-06 MED ORDER — PROPOFOL 10 MG/ML IV BOLUS
INTRAVENOUS | Status: DC | PRN
  Administered 2018-12-06: 200 mg via INTRAVENOUS

## 2018-12-06 MED ORDER — POVIDONE-IODINE 7.5 % EX SOLN: Freq: Once | CUTANEOUS | Status: DC

## 2018-12-06 MED ORDER — FENTANYL CITRATE (PF) 100 MCG/2ML IJ SOLN
50.0000 ug | INTRAMUSCULAR | Status: DC | PRN
  Administered 2018-12-06 (×2): 50 ug via INTRAVENOUS

## 2018-12-06 MED ORDER — ROCURONIUM BROMIDE 100 MG/10ML IV SOLN
INTRAVENOUS | Status: DC | PRN
  Administered 2018-12-06: 50 mg via INTRAVENOUS

## 2018-12-06 MED ORDER — BUPIVACAINE-EPINEPHRINE (PF) 0.25% -1:200000 IJ SOLN
INTRAMUSCULAR | Status: AC
  Filled 2018-12-06: qty 30

## 2018-12-06 MED ORDER — MIDAZOLAM HCL 2 MG/2ML IJ SOLN
1.0000 mg | INTRAMUSCULAR | Status: DC | PRN
  Administered 2018-12-06: 10:00:00 2 mg via INTRAVENOUS
  Administered 2018-12-06: 1 mg via INTRAVENOUS

## 2018-12-06 MED ORDER — DEXTROSE 50 % IV SOLN
INTRAVENOUS | Status: AC
  Filled 2018-12-06: qty 50

## 2018-12-06 MED ORDER — SODIUM CHLORIDE 0.9 % IR SOLN
Status: DC | PRN
  Administered 2018-12-06: 12000 mL

## 2018-12-06 MED ORDER — FENTANYL CITRATE (PF) 100 MCG/2ML IJ SOLN: 25.0000 ug | INTRAMUSCULAR | Status: DC | PRN

## 2018-12-06 SURGICAL SUPPLY — 82 items
AID PSTN UNV HD RSTRNT DISP (MISCELLANEOUS) ×1
ANCH SUT SWLK 19.1X4.75 (Anchor) ×3 IMPLANT
ANCHOR SUT BIO SW 4.75X19.1 (Anchor) ×3 IMPLANT
APL SKNCLS STERI-STRIP NONHPOA (GAUZE/BANDAGES/DRESSINGS)
BENZOIN TINCTURE PRP APPL 2/3 (GAUZE/BANDAGES/DRESSINGS) IMPLANT
BLADE EXCALIBUR 4.0X13 (MISCELLANEOUS) IMPLANT
BLADE SURG 15 STRL LF DISP TIS (BLADE) IMPLANT
BLADE SURG 15 STRL SS (BLADE)
BNDG COHESIVE 4X5 TAN STRL (GAUZE/BANDAGES/DRESSINGS) IMPLANT
BURR OVAL 8 FLU 5.0X13 (MISCELLANEOUS) ×2 IMPLANT
CANNULA TWIST IN 8.25X7CM (CANNULA) ×2 IMPLANT
COVER WAND RF STERILE (DRAPES) IMPLANT
DECANTER SPIKE VIAL GLASS SM (MISCELLANEOUS) IMPLANT
DISSECTOR  3.8MM X 13CM (MISCELLANEOUS) ×1
DISSECTOR 3.8MM X 13CM (MISCELLANEOUS) ×1 IMPLANT
DRAPE SHOULDER BEACH CHAIR (DRAPES) ×2 IMPLANT
DRAPE U-SHAPE 47X51 STRL (DRAPES) ×4 IMPLANT
DRSG PAD ABDOMINAL 8X10 ST (GAUZE/BANDAGES/DRESSINGS) ×2 IMPLANT
DURAPREP 26ML APPLICATOR (WOUND CARE) ×2 IMPLANT
ELECT REM PT RETURN 9FT ADLT (ELECTROSURGICAL) ×2
ELECTRODE REM PT RTRN 9FT ADLT (ELECTROSURGICAL) ×1 IMPLANT
GAUZE SPONGE 4X4 12PLY STRL (GAUZE/BANDAGES/DRESSINGS) ×2 IMPLANT
GAUZE XEROFORM 1X8 LF (GAUZE/BANDAGES/DRESSINGS) ×2 IMPLANT
GLOVE BIO SURGEON STRL SZ 6.5 (GLOVE) ×1 IMPLANT
GLOVE BIO SURGEON STRL SZ7 (GLOVE) IMPLANT
GLOVE BIOGEL PI IND STRL 7.0 (GLOVE) IMPLANT
GLOVE BIOGEL PI IND STRL 7.5 (GLOVE) ×1 IMPLANT
GLOVE BIOGEL PI INDICATOR 7.0 (GLOVE) ×2
GLOVE BIOGEL PI INDICATOR 7.5 (GLOVE) ×1
GLOVE SS BIOGEL STRL SZ 7.5 (GLOVE) ×1 IMPLANT
GLOVE SUPERSENSE BIOGEL SZ 7.5 (GLOVE) ×1
GOWN STRL REUS W/ TWL LRG LVL3 (GOWN DISPOSABLE) ×2 IMPLANT
GOWN STRL REUS W/ TWL XL LVL3 (GOWN DISPOSABLE) ×1 IMPLANT
GOWN STRL REUS W/TWL LRG LVL3 (GOWN DISPOSABLE) ×4
GOWN STRL REUS W/TWL XL LVL3 (GOWN DISPOSABLE) ×2
LOOP 2 FIBERLINK CLOSED (SUTURE) ×1 IMPLANT
MANIFOLD NEPTUNE II (INSTRUMENTS) ×2 IMPLANT
NDL 1/2 CIR CATGUT .05X1.09 (NEEDLE) IMPLANT
NDL SAFETY ECLIPSE 18X1.5 (NEEDLE) ×1 IMPLANT
NDL SCORPION MULTI FIRE (NEEDLE) IMPLANT
NDL SUT 6 .5 CRC .975X.05 MAYO (NEEDLE) IMPLANT
NEEDLE 1/2 CIR CATGUT .05X1.09 (NEEDLE) IMPLANT
NEEDLE HYPO 18GX1.5 SHARP (NEEDLE) ×2
NEEDLE MAYO TAPER (NEEDLE)
NEEDLE SCORPION MULTI FIRE (NEEDLE) IMPLANT
PACK ARTHROSCOPY DSU (CUSTOM PROCEDURE TRAY) ×2 IMPLANT
PACK BASIN DAY SURGERY FS (CUSTOM PROCEDURE TRAY) ×2 IMPLANT
PAD ALCOHOL SWAB (MISCELLANEOUS) ×4 IMPLANT
PASSER SUT SWIFTSTITCH HIP CRT (INSTRUMENTS) ×1 IMPLANT
PENCIL BUTTON HOLSTER BLD 10FT (ELECTRODE) IMPLANT
PORT APPOLLO RF 90DEGREE MULTI (SURGICAL WAND) IMPLANT
RESTRAINT HEAD UNIVERSAL NS (MISCELLANEOUS) ×2 IMPLANT
SHEET MEDIUM DRAPE 40X70 STRL (DRAPES) IMPLANT
SLEEVE SCD COMPRESS KNEE MED (MISCELLANEOUS) IMPLANT
SLING ARM FOAM STRAP LRG (SOFTGOODS) IMPLANT
SLING ARM IMMOBILIZER MED (SOFTGOODS) IMPLANT
SLING ARM MED ADULT FOAM STRAP (SOFTGOODS) IMPLANT
SLING ARM XL FOAM STRAP (SOFTGOODS) IMPLANT
SLING ULTRA III MED (ORTHOPEDIC SUPPLIES) IMPLANT
SPONGE LAP 4X18 RFD (DISPOSABLE) IMPLANT
STRIP CLOSURE SKIN 1/2X4 (GAUZE/BANDAGES/DRESSINGS) IMPLANT
SUCTION FRAZIER HANDLE 10FR (MISCELLANEOUS)
SUCTION TUBE FRAZIER 10FR DISP (MISCELLANEOUS) IMPLANT
SUT ETHILON 3 0 PS 1 (SUTURE) ×2 IMPLANT
SUT FIBERWIRE #2 38 T-5 BLUE (SUTURE)
SUT PDS AB 2-0 CT2 27 (SUTURE) IMPLANT
SUT PROLENE 3 0 PS 2 (SUTURE) ×1 IMPLANT
SUT TIGER TAPE 7 IN WHITE (SUTURE) IMPLANT
SUT VIC AB 0 SH 27 (SUTURE) IMPLANT
SUT VIC AB 2-0 PS2 27 (SUTURE) IMPLANT
SUT VIC AB 2-0 SH 27 (SUTURE)
SUT VIC AB 2-0 SH 27XBRD (SUTURE) IMPLANT
SUTURE FIBERWR #2 38 T-5 BLUE (SUTURE) IMPLANT
SYR 5ML LL (SYRINGE) ×2 IMPLANT
SYR BULB 3OZ (MISCELLANEOUS) IMPLANT
TAPE FIBER 2MM 7IN #2 BLUE (SUTURE) IMPLANT
TAPE HYPAFIX 6X30 (GAUZE/BANDAGES/DRESSINGS) IMPLANT
TAPE STRIPS DRAPE STRL (GAUZE/BANDAGES/DRESSINGS) ×2 IMPLANT
TOWEL GREEN STERILE FF (TOWEL DISPOSABLE) ×2 IMPLANT
TUBE CONNECTING 20X1/4 (TUBING) ×1 IMPLANT
TUBING ARTHROSCOPY IRRIG 16FT (MISCELLANEOUS) ×2 IMPLANT
WATER STERILE IRR 1000ML POUR (IV SOLUTION) ×2 IMPLANT

## 2018-12-06 NOTE — Interval H&P Note (Signed)
History and Physical Interval Note:  12/06/2018 10:41 AM  Clarence Alvarez  has presented today for surgery, with the diagnosis of right shoulder impingement,cartilage disorder,rotator cuff tear.  The various methods of treatment have been discussed with the patient and family. After consideration of risks, benefits and other options for treatment, the patient has consented to  Procedure(s) with comments: RESECTION DISTAL CLAVICAL AND DEBRIDEMENT OF THE SHOULDER (Right) - SCALENE BLOCK SHOULDER ARTHROSCOPY WITH ROTATOR CUFF REPAIR AND SUBACROMIAL DECOMPRESSION (Right) - SCALENE BLOCK as a surgical intervention.  The patient's history has been reviewed, patient examined, no change in status, stable for surgery.  I have reviewed the patient's chart and labs.  Questions were answered to the patient's satisfaction.     Lorn Junes

## 2018-12-06 NOTE — Anesthesia Procedure Notes (Signed)
Anesthesia Regional Block: Interscalene brachial plexus block   Pre-Anesthetic Checklist: ,, timeout performed, Correct Patient, Correct Site, Correct Laterality, Correct Procedure, Correct Position, site marked, Risks and benefits discussed,  Surgical consent,  Pre-op evaluation,  At surgeon's request and post-op pain management  Laterality: Right  Prep: chloraprep       Needles:  Injection technique: Single-shot  Needle Type: Echogenic Stimulator Needle     Needle Length: 9cm  Needle Gauge: 21   Needle insertion depth: 6 cm   Additional Needles:   Procedures:,,,, ultrasound used (permanent image in chart),,,,  Narrative:  Start time: 12/06/2018 10:28 AM End time: 12/06/2018 10:33 AM Injection made incrementally with aspirations every 5 mL.  Performed by: Personally  Anesthesiologist: Josephine Igo, MD  Additional Notes: Timeout performed. Patient sedated. Relevant anatomy ID'd using [place]. Incremental 2-21ml injection of LA with frequent aspiration. Patient tolerated procedure well.        Right Interscalene Block

## 2018-12-06 NOTE — Transfer of Care (Signed)
Immediate Anesthesia Transfer of Care Note  Patient: Clarence Alvarez  Procedure(s) Performed: RESECTION DISTAL CLAVICAL AND DEBRIDEMENT OF THE SHOULDER (Right Shoulder) SHOULDER ARTHROSCOPY WITH ROTATOR CUFF REPAIR AND SUBACROMIAL DECOMPRESSION (Right Shoulder)  Patient Location: PACU  Anesthesia Type:GA combined with regional for post-op pain  Level of Consciousness: sedated  Airway & Oxygen Therapy: Patient Spontanous Breathing and Patient connected to face mask oxygen  Post-op Assessment: Report given to RN and Post -op Vital signs reviewed and stable  Post vital signs: Reviewed and stable  Last Vitals:  Vitals Value Taken Time  BP    Temp    Pulse 91 12/06/2018 12:40 PM  Resp 13 12/06/2018 12:40 PM  SpO2 100 % 12/06/2018 12:40 PM  Vitals shown include unvalidated device data.  Last Pain:  Vitals:   12/06/18 1012  TempSrc: Oral  PainSc: 0-No pain      Patients Stated Pain Goal: 0 (47/09/62 [id number])  Complications: No apparent anesthesia complications

## 2018-12-06 NOTE — Discharge Instructions (Signed)
Post Anesthesia Home Care Instructions  Activity: Get plenty of rest for the remainder of the day. A responsible individual must stay with you for 24 hours following the procedure.  For the next 24 hours, DO NOT: -Drive a car -Operate machinery -Drink alcoholic beverages -Take any medication unless instructed by your physician -Make any legal decisions or sign important papers.  Meals: Start with liquid foods such as gelatin or soup. Progress to regular foods as tolerated. Avoid greasy, spicy, heavy foods. If nausea and/or vomiting occur, drink only clear liquids until the nausea and/or vomiting subsides. Call your physician if vomiting continues.  Special Instructions/Symptoms: Your throat may feel dry or sore from the anesthesia or the breathing tube placed in your throat during surgery. If this causes discomfort, gargle with warm salt water. The discomfort should disappear within 24 hours.  If you had a scopolamine patch placed behind your ear for the management of post- operative nausea and/or vomiting:  1. The medication in the patch is effective for 72 hours, after which it should be removed.  Wrap patch in a tissue and discard in the trash. Wash hands thoroughly with soap and water. 2. You may remove the patch earlier than 72 hours if you experience unpleasant side effects which may include dry mouth, dizziness or visual disturbances. 3. Avoid touching the patch. Wash your hands with soap and water after contact with the patch.      Regional Anesthesia Blocks  1. Numbness or the inability to move the "blocked" extremity may last from 3-48 hours after placement. The length of time depends on the medication injected and your individual response to the medication. If the numbness is not going away after 48 hours, call your surgeon.  2. The extremity that is blocked will need to be protected until the numbness is gone and the  Strength has returned. Because you cannot feel it, you  will need to take extra care to avoid injury. Because it may be weak, you may have difficulty moving it or using it. You may not know what position it is in without looking at it while the block is in effect.  3. For blocks in the legs and feet, returning to weight bearing and walking needs to be done carefully. You will need to wait until the numbness is entirely gone and the strength has returned. You should be able to move your leg and foot normally before you try and bear weight or walk. You will need someone to be with you when you first try to ensure you do not fall and possibly risk injury.  4. Bruising and tenderness at the needle site are common side effects and will resolve in a few days.  5. Persistent numbness or new problems with movement should be communicated to the surgeon or the [facility] ([phone])/ [facility] ([phone]).  Information for Discharge Teaching: EXPAREL (bupivacaine liposome injectable suspension)   Your surgeon or anesthesiologist gave you EXPAREL(bupivacaine) to help control your pain after surgery.   EXPAREL is a local anesthetic that provides pain relief by numbing the tissue around the surgical site.  EXPAREL is designed to release pain medication over time and can control pain for up to 72 hours.  Depending on how you respond to EXPAREL, you may require less pain medication during your recovery.  Possible side effects:  Temporary loss of sensation or ability to move in the area where bupivacaine was injected.  Nausea, vomiting, constipation  Rarely,   numbness and tingling in your mouth or lips, lightheadedness, or anxiety may occur.  Call your doctor right away if you think you may be experiencing any of these sensations, or if you have other questions regarding possible side effects.  Follow all other discharge instructions given to you by your surgeon or nurse. Eat a healthy diet and drink plenty of water or other  fluids.  If you return to the hospital for any reason within 96 hours following the administration of EXPAREL, it is important for health care providers to know that you have received this anesthetic. A teal colored band has been placed on your arm with the date, time and amount of EXPAREL you have received in order to alert and inform your health care providers. Please leave this armband in place for the full 96 hours following administration, and then you may remove the band. 

## 2018-12-06 NOTE — Anesthesia Procedure Notes (Addendum)
Procedure Name: Intubation Date/Time: 12/06/2018 11:04 AM Performed by: Willa Frater, CRNA Pre-anesthesia Checklist: Patient identified, Emergency Drugs available, Suction available and Patient being monitored Patient Re-evaluated:Patient Re-evaluated prior to induction Oxygen Delivery Method: Circle system utilized Preoxygenation: Pre-oxygenation with 100% oxygen Induction Type: IV induction Ventilation: Mask ventilation without difficulty Laryngoscope Size: Mac and 4 Grade View: Grade I Tube type: Oral Tube size: 8.0 mm Number of attempts: 1 Airway Equipment and Method: Stylet and Oral airway Placement Confirmation: ETT inserted through vocal cords under direct vision,  positive ETCO2 and breath sounds checked- equal and bilateral Secured at: 22 cm Tube secured with: Tape Dental Injury: Teeth and Oropharynx as per pre-operative assessment

## 2018-12-06 NOTE — Progress Notes (Signed)
Assisted Dr. Royce Macadamia with right, ultrasound guided, interscalene  block. Side rails up, monitors on throughout procedure. See vital signs in flow sheet. Tolerated Procedure well.

## 2018-12-06 NOTE — Anesthesia Preprocedure Evaluation (Addendum)
Anesthesia Evaluation  Patient identified by MRN, date of birth, ID band Patient awake    Reviewed: Allergy & Precautions, NPO status , Patient's Chart, lab work & pertinent test results  Airway Mallampati: II  TM Distance: >3 FB Neck ROM: Full    Dental  (+) Upper Dentures   Pulmonary asthma , sleep apnea , former smoker,    Pulmonary exam normal breath sounds clear to auscultation       Cardiovascular hypertension, Pt. on medications Normal cardiovascular exam Rhythm:Regular Rate:Normal     Neuro/Psych negative neurological ROS  negative psych ROS   GI/Hepatic Neg liver ROS, GERD  Medicated and Controlled,  Endo/Other  Obesity  Renal/GU Hx/o renal calculus  negative genitourinary   Musculoskeletal  (+) Arthritis , Osteoarthritis,  Torn right rotator cuff Impingement syndrome right shoulder   Abdominal (+) + obese,   Peds  Hematology negative hematology ROS (+)   Anesthesia Other Findings   Reproductive/Obstetrics                            Anesthesia Physical Anesthesia Plan  ASA: II  Anesthesia Plan: General   Post-op Pain Management:  Regional for Post-op pain   Induction: Intravenous  PONV Risk Score and Plan: 3 and Scopolamine patch - Pre-op, Midazolam, Ondansetron, Dexamethasone and Treatment may vary due to age or medical condition  Airway Management Planned: Oral ETT  Additional Equipment:   Intra-op Plan:   Post-operative Plan: Extubation in OR  Informed Consent: I have reviewed the patients History and Physical, chart, labs and discussed the procedure including the risks, benefits and alternatives for the proposed anesthesia with the patient or authorized representative who has indicated his/her understanding and acceptance.     Dental advisory given  Plan Discussed with: CRNA and Surgeon  Anesthesia Plan Comments:         Anesthesia Quick  Evaluation

## 2018-12-06 NOTE — Anesthesia Postprocedure Evaluation (Signed)
Anesthesia Post Note  Patient: Clarence Alvarez  Procedure(s) Performed: RESECTION DISTAL CLAVICAL AND DEBRIDEMENT OF THE SHOULDER (Right Shoulder) SHOULDER ARTHROSCOPY WITH ROTATOR CUFF REPAIR AND SUBACROMIAL DECOMPRESSION (Right Shoulder)     Patient location during evaluation: PACU Anesthesia Type: General Level of consciousness: awake and alert and oriented Pain management: pain level controlled Vital Signs Assessment: post-procedure vital signs reviewed and stable Respiratory status: spontaneous breathing, nonlabored ventilation and respiratory function stable Cardiovascular status: blood pressure returned to baseline and stable Postop Assessment: no apparent nausea or vomiting Anesthetic complications: no    Last Vitals:  Vitals:   12/06/18 1245 12/06/18 1302  BP: (!) 132/93 (!) 144/90  Pulse: 85 91  Resp: 13 19  Temp:    SpO2: 100% 95%    Last Pain:  Vitals:   12/06/18 1335  TempSrc:   PainSc: 0-No pain                 Verlon Pischke A.

## 2018-12-07 ENCOUNTER — Encounter (HOSPITAL_BASED_OUTPATIENT_CLINIC_OR_DEPARTMENT_OTHER): Payer: Self-pay | Admitting: Orthopedic Surgery

## 2018-12-07 NOTE — Op Note (Signed)
NAME: Clarence Alvarez, Clarence Alvarez MEDICAL RECORD PN:[id number] ACCOUNT [id number] DATE OF BIRTH:Nov 27, 1965 FACILITY: [facility] LOCATION: [facility] PHYSICIAN:Faige Seely Venetia Maxon, MD  OPERATIVE REPORT  DATE OF PROCEDURE:  12/06/2018  PREOPERATIVE DIAGNOSES:    1.  Right shoulder acute traumatic rotator cuff tear. 2.  Right shoulder acute traumatic labrum and partial biceps tendon tear. 3.  Right shoulder chronic nontraumatic impingement.  POSTOPERATIVE DIAGNOSES: 1.  Right shoulder acute traumatic rotator cuff tear. 2.  Right shoulder acute traumatic labrum and partial biceps tendon tear. 3.  Right shoulder chronic nontraumatic impingement.  PROCEDURE PERFORMED: 1.  Right shoulder examination under anesthesia followed by arthroscopically assisted rotator cuff repair using Arthrex double-row repair with 1 medial row anchor, 1 lateral row anchor. 2.  Right shoulder biceps tenodesis using Arthrex loop-and-tack technique. 3.  Right shoulder labrum and rotator cuff tear debridement--extensive. 4.  Right shoulder subacromial decompression.  SURGEON:  Elsie Saas, MD  ASSISTANT:  Matthew Saras, PA   ANESTHESIA:  General.  OPERATIVE TIME:  One hour.  COMPLICATIONS:  None.  INDICATION:  The patient is a 53 year old who has had significant right shoulder pain for the past 8-12 months with repetitive lifting.  Exam and MRI have revealed a rotator cuff tear, partial labrum and biceps tendon tear with impingement.  He has  failed conservative care and is now to undergo arthroscopy and repair.  DESCRIPTION OF PROCEDURE:  The patient was brought to the operating room on 12/06/2018 after an interscalene block was placed in the holding room by Anesthesia.  He was placed on the operating table in supine position.  He received antibiotics  preoperatively for prophylaxis.  After being placed under general anesthesia, his right shoulder was examined.  He had full range of motion, and his shoulder was stable  to ligamentous exam.  He was then placed in a beach-chair position, and his shoulder  and arm were prepped using sterile DuraPrep and draped using sterile technique.  Time-out procedure was called and the correct right shoulder identified.  Initially, through a posterior arthroscopic portal, the arthroscope with a pump attached was placed  in through an anterior portal and an arthroscopic probe was placed.  On initial inspection, the articular cartilage in the glenohumeral joint was intact.  He had partial tearing in the anterior, superior and posterior labrum 25%, which was debrided.   The anterior inferior labrum and anterior inferior glenohumeral ligament complex was intact.  The biceps tendon anchor was torn, and the biceps tendon had a 50% tear as well, which was felt to be amenable to a biceps tenodesis.  The rotator cuff showed a  complete tear of the supraspinatus, partial tear of the subscapularis 25-30%, which was debrided.  The teres minor and infraspinatus were intact.  Inferior capsular recess was free of pathology.  At this point, the biceps tenodesis was carried out using  the Arthrex loop-and-tack technique.  The FiberWire suture was placed around the biceps tendon and locked in position, and then the medial aspect of the biceps was cut and then the superior labrum was debrided down to a stable base.  The tenodesis was  then carried out with the Arthrex SwiveLock anchor being placed in the intraarticular portion of the bicipital groove with firm and tight fixation.  At this point, subacromial space was entered and a lateral arthroscopic portal was made.  A large amount  of bursitis was resected.  Impingement was noted as the undersurface of the acromion was digging into the rotator cuff tear, and a  subacromial decompression was carried out, removing 6-8 mm of the undersurface of the anterior, anterolateral, and  anteromedial acromion and CA ligament release carried out as well.  The [facility] joint  was not pathologic and thus was not resected.  At this point, through an accessory lateral portal, the rotator cuff tear was repaired, primarily with 1 medial row SwiveLock  anchor and 1 lateral row anchor.  The medial row anchor had the sutures passed through the rotator cuff tear in a mattress suture technique and then the medial row tied down tightly and then all of the sutures placed in the lateral row anchor and  deployed with excellent fixation and anatomic reduction of the rotator cuff tear.  The shoulder could then be brought through a full range of motion with no impingement on the repair.  At this point, I felt that all pathology had been satisfactorily  addressed.  The instruments were removed.  Portals were closed with 3-0 nylon suture.  Sterile dressings and a sling applied and the patient awakened and taken to recovery room in stable condition.  FOLLOWUP CARE:  The patient will be followed as an outpatient on oxycodone and Flexeril and an abduction sling.  He will be seen back in the office in a week for sutures out and followup.  LN/NUANCE  D:12/07/2018 T:12/07/2018 JOB:[id number]

## 2019-01-28 ENCOUNTER — Telehealth: Payer: Self-pay | Admitting: Family Medicine

## 2019-01-29 NOTE — Telephone Encounter (Signed)
Please schedule patient for a CPE anytime now. Thank you

## 2019-01-30 NOTE — Telephone Encounter (Signed)
Call pt to schedule cpx. Dr copland next cpx 10/5. Pt stated he needed cpx in august because of insurance.  Is it ok to work in?

## 2019-01-31 NOTE — Telephone Encounter (Signed)
yes

## 2019-02-01 NOTE — Telephone Encounter (Signed)
Labs 7/27 cpx 7/30 Pt aware

## 2019-02-02 ENCOUNTER — Other Ambulatory Visit: Payer: Self-pay | Admitting: Family Medicine

## 2019-02-02 DIAGNOSIS — Z131 Encounter for screening for diabetes mellitus: Secondary | ICD-10-CM

## 2019-02-02 DIAGNOSIS — Z0000 Encounter for general adult medical examination without abnormal findings: Secondary | ICD-10-CM

## 2019-02-05 ENCOUNTER — Other Ambulatory Visit: Payer: Self-pay

## 2019-02-05 ENCOUNTER — Other Ambulatory Visit (INDEPENDENT_AMBULATORY_CARE_PROVIDER_SITE_OTHER): Payer: Commercial Managed Care - PPO

## 2019-02-05 DIAGNOSIS — Z0000 Encounter for general adult medical examination without abnormal findings: Secondary | ICD-10-CM

## 2019-02-05 DIAGNOSIS — Z131 Encounter for screening for diabetes mellitus: Secondary | ICD-10-CM | POA: Diagnosis not present

## 2019-02-05 LAB — CBC WITH DIFFERENTIAL/PLATELET
Basophils Absolute: 0 10*3/uL (ref 0.0–0.1)
Basophils Relative: 1.1 % (ref 0.0–3.0)
Eosinophils Absolute: 0.1 10*3/uL (ref 0.0–0.7)
Eosinophils Relative: 3.3 % (ref 0.0–5.0)
HCT: 45 % (ref 39.0–52.0)
Hemoglobin: 15.1 g/dL (ref 13.0–17.0)
Lymphocytes Relative: 66.3 % — ABNORMAL HIGH (ref 12.0–46.0)
Lymphs Abs: 2.2 10*3/uL (ref 0.7–4.0)
MCHC: 33.6 g/dL (ref 30.0–36.0)
MCV: 91.6 fl (ref 78.0–100.0)
Monocytes Absolute: 0.3 10*3/uL (ref 0.1–1.0)
Monocytes Relative: 8.9 % (ref 3.0–12.0)
Neutro Abs: 0.7 10*3/uL — ABNORMAL LOW (ref 1.4–7.7)
Neutrophils Relative %: 20.4 % — ABNORMAL LOW (ref 43.0–77.0)
Platelets: 169 10*3/uL (ref 150.0–400.0)
RBC: 4.91 Mil/uL (ref 4.22–5.81)
RDW: 13.3 % (ref 11.5–15.5)
WBC: 3.3 10*3/uL — ABNORMAL LOW (ref 4.0–10.5)

## 2019-02-05 LAB — LIPID PANEL
Cholesterol: 197 mg/dL (ref 0–200)
HDL: 26.9 mg/dL — ABNORMAL LOW (ref >39.00)
Total CHOL/HDL Ratio: 7
Triglycerides: 549 mg/dL — ABNORMAL HIGH (ref 0.0–149.0)

## 2019-02-05 LAB — HEPATIC FUNCTION PANEL
ALT: 33 U/L (ref 0–53)
AST: 42 U/L — ABNORMAL HIGH (ref 0–37)
Albumin: 4.2 g/dL (ref 3.5–5.2)
Alkaline Phosphatase: 79 U/L (ref 39–117)
Bilirubin, Direct: 0.1 mg/dL (ref 0.0–0.3)
Total Bilirubin: 0.7 mg/dL (ref 0.2–1.2)
Total Protein: 7.4 g/dL (ref 6.0–8.3)

## 2019-02-05 LAB — BASIC METABOLIC PANEL
BUN: 15 mg/dL (ref 6–23)
CO2: 31 mEq/L (ref 19–32)
Calcium: 9.5 mg/dL (ref 8.4–10.5)
Chloride: 101 mEq/L (ref 96–112)
Creatinine, Ser: 1.29 mg/dL (ref 0.40–1.50)
GFR: 70.07 mL/min (ref >60.00)
Glucose, Bld: 111 mg/dL — ABNORMAL HIGH (ref 70–99)
Potassium: 3.9 mEq/L (ref 3.5–5.1)
Sodium: 139 mEq/L (ref 135–145)

## 2019-02-05 LAB — LDL CHOLESTEROL, DIRECT: Direct LDL: 76 mg/dL

## 2019-02-05 LAB — HEMOGLOBIN A1C: Hgb A1c MFr Bld: 6.7 % — ABNORMAL HIGH (ref 4.6–6.5)

## 2019-02-06 LAB — PSA, TOTAL WITH REFLEX TO PSA, FREE: PSA, Total: 0.4 ng/mL (ref <=4.0)

## 2019-02-08 ENCOUNTER — Ambulatory Visit (INDEPENDENT_AMBULATORY_CARE_PROVIDER_SITE_OTHER): Payer: Commercial Managed Care - PPO | Admitting: Family Medicine

## 2019-02-08 ENCOUNTER — Other Ambulatory Visit: Payer: Self-pay

## 2019-02-08 ENCOUNTER — Encounter: Payer: Self-pay | Admitting: Family Medicine

## 2019-02-08 VITALS — BP 130/82 | HR 91 | Temp 98.3°F | Ht 71.5 in | Wt 261.0 lb

## 2019-02-08 DIAGNOSIS — Z0000 Encounter for general adult medical examination without abnormal findings: Secondary | ICD-10-CM | POA: Diagnosis not present

## 2019-02-08 DIAGNOSIS — E119 Type 2 diabetes mellitus without complications: Secondary | ICD-10-CM | POA: Diagnosis not present

## 2019-02-08 DIAGNOSIS — Z1211 Encounter for screening for malignant neoplasm of colon: Secondary | ICD-10-CM

## 2019-02-08 NOTE — Patient Instructions (Signed)

## 2019-02-08 NOTE — Progress Notes (Signed)
Clarence Gurry T. Deion Forgue, MD Primary Care and [place] at [facility] [place], [address] Phone: [phone]  FAX: [phone]  [name] - 53 y.o. male  MRN [id number]  Date of Birth: Apr 30, 1966  Visit Date: 02/08/2019  PCP: Owens Loffler, MD  Referred by: Owens Loffler, MD  Chief Complaint  Patient presents with  . Annual Exam   Patient Care Team: Owens Loffler, MD as PCP - General (Family Medicine) Subjective:   Clarence Alvarez is a 53 y.o. pleasant patient who presents with the following:  Preventative Health Maintenance Visit:  Health Maintenance Summary Reviewed and updated, unless pt declines services.  Tobacco History Reviewed. Alcohol: No concerns, no excessive use - every other day, 2 a day Exercise Habits: Some activity, rec at least 30 mins 5 times a week STD concerns: no risk or activity to increase risk Drug Use: None Encouraged self-testicular check  New onset DM Work has taken a lot out of him. Managing his pain.   04/01/2019 - colon recall  Doing cardio  Using flovent BID Using some flonase, too  Lab Results  Component Value Date   HGBA1C 6.7 (H) 02/05/2019   Wt Readings from Last 3 Encounters:  02/08/19 261 lb (118.4 kg)  12/06/18 263 lb 7.2 oz (119.5 kg)  08/21/18 256 lb 3.2 oz (116.2 kg)     Health Maintenance  Topic Date Due  . INFLUENZA VACCINE  02/10/2019  . COLONOSCOPY  03/26/2019  . TETANUS/TDAP  12/13/2027  . Hepatitis C Screening  Completed  . HIV Screening  Completed   Immunization History  Administered Date(s) Administered  . Influenza Whole 04/11/2012  . Influenza-Unspecified 03/12/2016, 04/10/2017  . Pneumococcal Polysaccharide-23 10/02/2018  . Td 05/12/2004  . Tdap 12/12/2017   Patient Active Problem List   Diagnosis Date Noted  . Traumatic complete tear of right rotator cuff 11/30/2018  . Essential hypertension 03/20/2018  . Lumbar pain  with radiation down right leg 12/23/2016  . OSA (obstructive sleep apnea) 01/26/2013  . ALLERGIC RHINITIS, SEASONAL 09/15/2009  . Asthma 09/08/2006   Past Medical History:  Diagnosis Date  . Allergic rhinitis, seasonal   . Arthritis    right knee  . GERD (gastroesophageal reflux disease)   . History of adenomatous polyp of colon    03-25-2014  tubular adenoma's /  hyperplastic  . History of prostatitis   . Hypertension   . Left ureteral stone   . Mild obstructive sleep apnea    per study 2014, pt states he does not have sleep apnea  . Moderate persistent asthma    followed by pcp-- uncomplicated  . MRSA infection 2010   buttocks  . Traumatic complete tear of right rotator cuff 11/30/2018   Past Surgical History:  Procedure Laterality Date  . [place] STUDY N/A 05/04/2018   Procedure: [place] STUDY;  Surgeon: Jonathon Bellows, MD;  Location[facility] ENDOSCOPY;  Service: Gastroenterology;  Laterality: N/A;  . anterior cervucal    . COLONOSCOPY  03/25/2014  . ESOPHAGEAL MANOMETRY N/A 05/04/2018   Procedure: ESOPHAGEAL MANOMETRY (EM);  Surgeon: Jonathon Bellows, MD;  Location: [facility] ENDOSCOPY;  Service: Gastroenterology;  Laterality: N/A;  . ESOPHAGOGASTRODUODENOSCOPY (EGD) WITH PROPOFOL N/A 02/24/2018   Procedure: ESOPHAGOGASTRODUODENOSCOPY (EGD) WITH PROPOFOL;  Surgeon: Jonathon Bellows, MD;  Location: [facility] ENDOSCOPY;  Service: Gastroenterology;  Laterality: N/A;  . KNEE SURGERY  2018  . RESECTION DISTAL CLAVICAL Right 12/06/2018   Procedure: RESECTION  DISTAL CLAVICAL AND DEBRIDEMENT OF THE SHOULDER;  Surgeon: Elsie Saas, MD;  Location: [place];  Service: Orthopedics;  Laterality: Right;  SCALENE BLOCK  . SHOULDER ARTHROSCOPY WITH ROTATOR CUFF REPAIR AND SUBACROMIAL DECOMPRESSION Left 07-24-2010  dr Noemi Chapel @  [facility]   and Labrum tear debridement  . SHOULDER ARTHROSCOPY WITH ROTATOR CUFF REPAIR AND SUBACROMIAL DECOMPRESSION Right 12/06/2018   Procedure: SHOULDER ARTHROSCOPY  WITH ROTATOR CUFF REPAIR AND SUBACROMIAL DECOMPRESSION;  Surgeon: Elsie Saas, MD;  Location: [place];  Service: Orthopedics;  Laterality: Right;  SCALENE BLOCK   Social History   Socioeconomic History  . Marital status: Divorced    Spouse name: Not on file  . Number of children: Not on file  . Years of education: Not on file  . Highest education level: Not on file  Occupational History  . 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 Financial resource strain: Not on file  . Food insecurity    Worry: Not on file    Inability: Not on file  . Transportation needs    Medical: Not on file    Non-medical: Not on file  Tobacco Use  . Smoking status: Former Smoker    Years: 3.00    Quit date: 07/12/1984    Years since quitting: 34.6  . Smokeless tobacco: Former Systems developer    Types: Snuff  . Tobacco comment: "EXPERIMENTED" W/SMOKING--NOT EVEN A PACK X 1 WEEK.   Substance and Sexual Activity  . Alcohol use: Yes    Alcohol/week: 3.0 standard drinks    Types: 3 Shots of liquor per week    Comment: 1-2 drinks per week  . Drug use: No  . Sexual activity: Not on file    Comment: vasectomy-- 2010  Lifestyle  . Physical activity    Days per week: Not on file    Minutes per session: Not on file  . Stress: Not on file  Relationships  . Social Herbalist on phone: Not on file    Gets together: Not on file    Attends religious service: Not on file    Active member of club or organization: Not on file    Attends meetings of clubs or organizations: Not on file    Relationship status: Not on file  . Intimate partner violence    Fear of current or ex partner: Not on file    Emotionally abused: Not on file    Physically abused: Not on file    Forced sexual activity: Not on file  Other Topics Concern  . Not on file  Social History Narrative  . Not on file   Family  History  Problem Relation Age of Onset  . Diabetes Maternal Grandmother   . Colon cancer Maternal Grandmother 87  . Diabetes Mother   . Hepatitis C Mother   . Prostate cancer Father   . Diabetes Paternal Grandmother   . Mental illness Brother   . Mental illness Sister   . Mental illness Daughter   . Stomach cancer Maternal Grandfather   . COPD Other        Aunt  . Diabetes Other        siblings  . Pancreatic cancer Paternal Aunt   . Esophageal cancer Neg Hx   . Rectal cancer Neg Hx    No Known Allergies  Medication list has been  reviewed and updated.   General: Denies fever, chills, sweats. No significant weight loss. Eyes: Denies blurring,significant itching ENT: Denies earache, sore throat, and hoarseness. Cardiovascular: Denies chest pains, palpitations, dyspnea on exertion Respiratory: Denies cough, dyspnea at rest,wheeezing Breast: no concerns about lumps GI: Denies nausea, vomiting, diarrhea, constipation, change in bowel habits, abdominal pain, melena, hematochezia GU: Denies penile discharge, ED, urinary flow / outflow problems. No STD concerns. Musculoskeletal: Denies back pain, joint pain Derm: Denies rash, itching Neuro: Denies  paresthesias, frequent falls, frequent headaches Psych: Denies depression, anxiety Endocrine: Denies cold intolerance, heat intolerance, polydipsia Heme: Denies enlarged lymph nodes Allergy: No hayfever  Objective:   BP 130/82   Pulse 91   Temp 98.3 F (36.8 C) (Temporal)   Ht 5' 11.5" (1.816 m)   Wt 261 lb (118.4 kg)   SpO2 96%   BMI 35.89 kg/m  Ideal Body Weight: Weight in (lb) to have BMI = 25: 181.4  Ideal Body Weight: Weight in (lb) to have BMI = 25: 181.4 No exam data present Depression screen [facility] 2/9 02/08/2019 12/12/2017  Decreased Interest 0 0  Down, Depressed, Hopeless 0 1  PHQ - 2 Score 0 1     GEN: well developed, well nourished, no acute distress Eyes: conjunctiva and lids normal, PERRLA, EOMI ENT: TM  clear, nares clear, oral exam WNL Neck: supple, no lymphadenopathy, no thyromegaly, no JVD Pulm: clear to auscultation and percussion, respiratory effort normal CV: regular rate and rhythm, S1-S2, no murmur, rub or gallop, no bruits, peripheral pulses normal and symmetric, no cyanosis, clubbing, edema or varicosities GI: soft, non-tender; no hepatosplenomegaly, masses; active bowel sounds all quadrants GU: no hernia, testicular mass, penile discharge Lymph: no cervical, axillary or inguinal adenopathy MSK: gait normal, muscle tone and strength WNL, no joint swelling, effusions, discoloration, crepitus  SKIN: clear, good turgor, color WNL, no rashes, lesions, or ulcerations Neuro: normal mental status, normal strength, sensation, and motion Psych: alert; oriented to person, place and time, normally interactive and not anxious or depressed in appearance. All labs reviewed with patient. Results for orders placed or performed in visit on 02/05/19  PSA, Total with Reflex to PSA, Free  Result Value Ref Range   PSA, Total 0.4 < OR = 4.0 ng/mL  Hemoglobin A1c  Result Value Ref Range   Hgb A1c MFr Bld 6.7 (H) 4.6 - 6.5 %  CBC with Differential/Platelet  Result Value Ref Range   WBC 3.3 (L) 4.0 - 10.5 K/uL   RBC 4.91 4.22 - 5.81 Mil/uL   Hemoglobin 15.1 13.0 - 17.0 g/dL   HCT 45.0 39.0 - 52.0 %   MCV 91.6 78.0 - 100.0 fl   MCHC 33.6 30.0 - 36.0 g/dL   RDW 13.3 11.5 - 15.5 %   Platelets 169.0 150.0 - 400.0 K/uL   Neutrophils Relative % 20.4 (L) 43.0 - 77.0 %   Lymphocytes Relative 66.3 (H) 12.0 - 46.0 %   Monocytes Relative 8.9 3.0 - 12.0 %   Eosinophils Relative 3.3 0.0 - 5.0 %   Basophils Relative 1.1 0.0 - 3.0 %   Neutro Abs 0.7 (L) 1.4 - 7.7 K/uL   Lymphs Abs 2.2 0.7 - 4.0 K/uL   Monocytes Absolute 0.3 0.1 - 1.0 K/uL   Eosinophils Absolute 0.1 0.0 - 0.7 K/uL   Basophils Absolute 0.0 0.0 - 0.1 K/uL  Basic metabolic panel  Result Value Ref Range   Sodium 139 135 - 145 mEq/L    Potassium 3.9 3.5 - 5.1  mEq/L   Chloride 101 96 - 112 mEq/L   CO2 31 19 - 32 mEq/L   Glucose, Bld 111 (H) 70 - 99 mg/dL   BUN 15 6 - 23 mg/dL   Creatinine, Ser 1.29 0.40 - 1.50 mg/dL   Calcium 9.5 8.4 - 10.5 mg/dL   GFR 70.07 >60.00 mL/min  Hepatic function panel  Result Value Ref Range   Total Bilirubin 0.7 0.2 - 1.2 mg/dL   Bilirubin, Direct 0.1 0.0 - 0.3 mg/dL   Alkaline Phosphatase 79 39 - 117 U/L   AST 42 (H) 0 - 37 U/L   ALT 33 0 - 53 U/L   Total Protein 7.4 6.0 - 8.3 g/dL   Albumin 4.2 3.5 - 5.2 g/dL  Lipid panel  Result Value Ref Range   Cholesterol 197 0 - 200 mg/dL   Triglycerides (H) 0.0 - 149.0 mg/dL    549.0 Triglyceride is over 400; calculations on Lipids are invalid.   HDL 26.90 (L) >39.00 mg/dL   Total CHOL/HDL Ratio 7   LDL cholesterol, direct  Result Value Ref Range   Direct LDL 76.0 mg/dL    Assessment and Plan:     ICD-10-CM   1. Healthcare maintenance  Z00.00   2. Type 2 diabetes mellitus without complication, without long-term current use of insulin (HCC)  E11.9 Ambulatory referral to diabetic education  3. Screening for malignant neoplasm of colon  Z12.11 Ambulatory referral to Gastroenterology   New onset DM, trial of diet and exercise with close f/u Colon recall DM education  Health Maintenance Exam: The patient's preventative maintenance and recommended screening tests for an annual wellness exam were reviewed in full today. Brought up to date unless services declined.  Counselled on the importance of diet, exercise, and its role in overall health and mortality. The patient's FH and SH was reviewed, including their home life, tobacco status, and drug and alcohol status.  Follow-up in 1 year for physical exam or additional follow-up below.  Follow-up: Return in about 3 months (around 05/11/2019) for Diabetes. Or follow-up in 1 year if not noted.  No orders of the defined types were placed in this encounter.  Medications Discontinued During  This Encounter  Medication Reason  . oxyCODONE (OXY IR/ROXICODONE) 5 MG immediate release tablet Completed Course  . cyclobenzaprine (FLEXERIL) 5 MG tablet Completed Course   Orders Placed This Encounter  Procedures  . Ambulatory referral to Gastroenterology  . Ambulatory referral to diabetic education    Signed,  Frederico Hamman T. Maziah Smola, MD   Allergies as of 02/08/2019   No Known Allergies     Medication List       Accurate as of February 08, 2019  8:55 AM. If you have any questions, ask your nurse or doctor.        STOP taking these medications   cyclobenzaprine 5 MG tablet Commonly known as: FLEXERIL Stopped by: Owens Loffler, MD   oxyCODONE 5 MG immediate release tablet Commonly known as: Oxy IR/ROXICODONE Stopped by: Owens Loffler, MD     TAKE these medications   amLODipine 5 MG tablet Commonly known as: NORVASC TAKE 1 TABLET BY MOUTH EVERY DAY   cetirizine 10 MG tablet Commonly known as: ZYRTEC Take 10 mg by mouth every morning.   Flovent HFA 110 MCG/ACT inhaler Generic drug: fluticasone TAKE 1 PUFF BY MOUTH TWICE A DAY   fluticasone 50 MCG/ACT nasal spray Commonly known as: FLONASE Place 2 sprays into both nostrils daily.   HYDROcodone-acetaminophen 5-325  MG tablet Commonly known as: NORCO/VICODIN   omeprazole 20 MG capsule Commonly known as: PRILOSEC Take 1 capsule (20 mg total) by mouth daily.   ProAir HFA 108 (90 Base) MCG/ACT inhaler Generic drug: albuterol INHALE 2 PUFFS 4 TIMES A DAY   vitamin C 100 MG tablet Take 100 mg by mouth daily.

## 2019-02-19 ENCOUNTER — Encounter: Payer: Self-pay | Admitting: Gastroenterology

## 2019-02-27 ENCOUNTER — Other Ambulatory Visit: Payer: Self-pay | Admitting: Family Medicine

## 2019-03-12 ENCOUNTER — Telehealth: Payer: Self-pay | Admitting: Gastroenterology

## 2019-03-12 NOTE — Telephone Encounter (Signed)
Pt is concerned about waiting until 2022 for recall and stated Grandmother was dx with colon cancer at the age of 28.  Became deceased at the age of 24 from colon cancer. Pt would like to be able to have one before sad recall date.

## 2019-03-12 NOTE — Telephone Encounter (Signed)
Dr. Fuller Plan, please review and advise on recall.

## 2019-03-13 NOTE — Telephone Encounter (Signed)
Personal history of adenomatous colon polyp in 03/2014 so current guidelines recommend a 7 year surveillance interval. GM history of colon cancer at age 53 adds little if any risk for him. However I'm OK with colonoscopy at 5 year interval which is 03/2019 if he prefers.

## 2019-03-13 NOTE — Telephone Encounter (Signed)
Patient prefers not to wait and will come for pre-visit and colonoscopy 9/11 and 9/25.

## 2019-03-23 ENCOUNTER — Ambulatory Visit (AMBULATORY_SURGERY_CENTER): Payer: Self-pay | Admitting: *Deleted

## 2019-03-23 ENCOUNTER — Other Ambulatory Visit: Payer: Self-pay

## 2019-03-23 VITALS — Temp 97.5°F | Ht 71.5 in | Wt 264.4 lb

## 2019-03-23 DIAGNOSIS — Z86010 Personal history of colonic polyps: Secondary | ICD-10-CM

## 2019-03-23 MED ORDER — SUPREP BOWEL PREP KIT 17.5-3.13-1.6 GM/177ML PO SOLN: ORAL | 0 refills | Status: DC

## 2019-03-23 NOTE — Progress Notes (Signed)
No egg or soy allergy  No home oxygen used   No diet medications taken  No constipation issues per pt  No anesthesia or intubation problems per pt  Pt is aware that care partner will wait in the car during procedure; if they feel like they will be too hot to wait in the car; they may wait in the lobby.  We want them to wear a mask (we do not have any that we can provide them), practice social distancing, and we will check their temperatures when they get here.  I did remind patient that their care partner needs to stay in the parking lot the entire time. Pt will wear mask into building.  Registered in [employer] coupon given

## 2019-03-27 ENCOUNTER — Telehealth: Payer: Self-pay | Admitting: Family Medicine

## 2019-03-27 ENCOUNTER — Encounter: Payer: Self-pay | Admitting: Gastroenterology

## 2019-03-27 ENCOUNTER — Other Ambulatory Visit: Payer: Self-pay | Admitting: Family Medicine

## 2019-03-27 DIAGNOSIS — E119 Type 2 diabetes mellitus without complications: Secondary | ICD-10-CM

## 2019-03-27 DIAGNOSIS — E785 Hyperlipidemia, unspecified: Secondary | ICD-10-CM

## 2019-03-27 MED ORDER — OMEPRAZOLE 20 MG PO CPDR: 20.0000 mg | DELAYED_RELEASE_CAPSULE | Freq: Every day | ORAL | 3 refills | Status: DC

## 2019-03-27 NOTE — Telephone Encounter (Signed)
Patient stated that he is really needing the OMERPRAZOLE. He stated that Dr Vicente Males had previously prescribed this medication but he can not get anyone in the office to send the refills after the pharmacy contacting them mulitple times.  Patient stated he has been without this medication 4 days. And really needs to see if this could be sent if possible.    [facility]   Patient requested a call back

## 2019-03-27 NOTE — Telephone Encounter (Signed)
Refills sent as requested.  Left message for Patricelli that [facility] to [facility] [address]

## 2019-03-27 NOTE — Telephone Encounter (Signed)
Can you help get him a fasting lab appointment?   FLP, E78.5 hyperlipidemia A1c, elevated blood sugars BMET: elevated blood sugar

## 2019-03-27 NOTE — Telephone Encounter (Signed)
Lab orders are in, lab appt made, f/u appt made with Copland

## 2019-03-27 NOTE — Telephone Encounter (Signed)
Patient stated that at his lab visit he was suppose to be fasting. He did admit that with his morning medication he did drink a supplement drink. So he believes that this effected the labs. Patient is really wanting to redo these labs to see if maybe the drink he took that morning effected it in anyway .

## 2019-04-05 ENCOUNTER — Telehealth: Payer: Self-pay

## 2019-04-05 NOTE — Telephone Encounter (Signed)
Patient answered "NO" to all covid-19 questions °

## 2019-04-05 NOTE — Telephone Encounter (Signed)
Covid-19 screening questions   Do you now or have you had a fever in the last 14 days?  Do you have any respiratory symptoms of shortness of breath or cough now or in the last 14 days?  Do you have any family members or close contacts with diagnosed or suspected Covid-19 in the past 14 days?  Have you been tested for Covid-19 and found to be positive?       

## 2019-04-06 ENCOUNTER — Encounter: Payer: Self-pay | Admitting: Gastroenterology

## 2019-04-06 ENCOUNTER — Other Ambulatory Visit: Payer: Self-pay

## 2019-04-06 ENCOUNTER — Ambulatory Visit (AMBULATORY_SURGERY_CENTER): Payer: Commercial Managed Care - PPO | Admitting: Gastroenterology

## 2019-04-06 VITALS — BP 144/89 | HR 61 | Temp 97.6°F | Resp 13 | Ht 71.5 in | Wt 264.4 lb

## 2019-04-06 DIAGNOSIS — D120 Benign neoplasm of cecum: Secondary | ICD-10-CM

## 2019-04-06 DIAGNOSIS — D125 Benign neoplasm of sigmoid colon: Secondary | ICD-10-CM | POA: Diagnosis not present

## 2019-04-06 DIAGNOSIS — D124 Benign neoplasm of descending colon: Secondary | ICD-10-CM | POA: Diagnosis not present

## 2019-04-06 DIAGNOSIS — D122 Benign neoplasm of ascending colon: Secondary | ICD-10-CM

## 2019-04-06 DIAGNOSIS — D123 Benign neoplasm of transverse colon: Secondary | ICD-10-CM

## 2019-04-06 DIAGNOSIS — Z86010 Personal history of colonic polyps: Secondary | ICD-10-CM

## 2019-04-06 MED ORDER — SODIUM CHLORIDE 0.9 % IV SOLN: 500.0000 mL | Freq: Once | INTRAVENOUS | Status: DC

## 2019-04-06 NOTE — Progress Notes (Signed)
Called to room to assist during endoscopic procedure.  Patient ID and intended procedure confirmed with present staff. Received instructions for my participation in the procedure from the performing physician.  

## 2019-04-06 NOTE — Op Note (Signed)
[place] Patient Name: Clarence Alvarez Procedure Date: 04/06/2019 2:36 PM MRN: [id number] Endoscopist: Ladene Artist , MD Age: 53 Referring MD:  Date of Birth: Jun 21, 1966 Gender: Male Account #: [id number] Procedure:                Colonoscopy Indications:              Surveillance: Personal history of adenomatous                            polyps on last colonoscopy 5 years ago Medicines:                Monitored Anesthesia Care Procedure:                Pre-Anesthesia Assessment:                           - Prior to the procedure, a History and Physical                            was performed, and patient medications and                            allergies were reviewed. The patient's tolerance of                            previous anesthesia was also reviewed. The risks                            and benefits of the procedure and the sedation                            options and risks were discussed with the patient.                            All questions were answered, and informed consent                            was obtained. Prior Anticoagulants: The patient has                            taken no previous anticoagulant or antiplatelet                            agents. ASA Grade Assessment: II - A patient with                            mild systemic disease. After reviewing the risks                            and benefits, the patient was deemed in                            satisfactory condition to undergo the procedure.  After obtaining informed consent, the colonoscope                            was passed under direct vision. Throughout the                            procedure, the patient's blood pressure, pulse, and                            oxygen saturations were monitored continuously. The                            Colonoscope was introduced through the anus and                            advanced to the the cecum,  identified by                            appendiceal orifice and ileocecal valve. The                            ileocecal valve, appendiceal orifice, and rectum                            were photographed. The quality of the bowel                            preparation was good. The colonoscopy was performed                            without difficulty. The patient tolerated the                            procedure well. Scope In: 2:43:21 PM Scope Out: 3:01:08 PM Scope Withdrawal Time: 0 hours 14 minutes 52 seconds  Total Procedure Duration: 0 hours 17 minutes 47 seconds  Findings:                 The perianal and digital rectal examinations were                            normal.                           A 12 mm polyp was found in the transverse colon.                            The polyp was semi-pedunculated. The polyp was                            removed with a hot snare. Resection and retrieval                            were complete.  Four sessile polyps were found in the sigmoid                            colon, descending colon, ascending colon and cecum.                            The polyps were 6 to 7 mm in size. These polyps                            were removed with a cold snare. Resection and                            retrieval were complete.                           A single medium-mouthed diverticulum was found in                            the ascending colon.                           The exam was otherwise without abnormality on                            direct and retroflexion views. Complications:            No immediate complications. Estimated blood loss:                            None. Estimated Blood Loss:     Estimated blood loss: none. Impression:               - One 12 mm polyp in the transverse colon, removed                            with a hot snare. Resected and retrieved.                           - Four 6 to 7 mm  polyps in the sigmoid colon, in                            the descending colon, in the ascending colon and in                            the cecum, removed with a cold snare. Resected and                            retrieved.                           - Ascending colon diverticulum.                           - The examination was otherwise normal on direct  and retroflexion views. Recommendation:           - Repeat colonoscopy in 3 years for surveillance.                           - Patient has a contact number available for                            emergencies. The signs and symptoms of potential                            delayed complications were discussed with the                            patient. Return to normal activities tomorrow.                            Written discharge instructions were provided to the                            patient.                           - Resume previous diet.                           - Continue present medications.                           - Await pathology results.                           - No aspirin, ibuprofen, naproxen, or other                            non-steroidal anti-inflammatory drugs for 2 weeks                            after polyp removal. Ladene Artist, MD 04/06/2019 3:08:02 PM This report has been signed electronically.

## 2019-04-06 NOTE — Progress Notes (Addendum)
Clarence Alvarez  Pt's states no medical or surgical changes since previsit or office visit.  Pt reported he ate [place] bacon and a boiled egg yesterday at 07:30.  Reported clear yellow liquid on last trip to the restroom. maw

## 2019-04-06 NOTE — Progress Notes (Signed)
Report given to PACU, vss 

## 2019-04-06 NOTE — Patient Instructions (Signed)
YOU HAD AN ENDOSCOPIC PROCEDURE TODAY AT THE [facility]:   Refer to the procedure report that was given to you for any specific questions about what was found during the examination.  If the procedure report does not answer your questions, please call your gastroenterologist to clarify.  If you requested that your care partner not be given the details of your procedure findings, then the procedure report has been included in a sealed envelope for you to review at your convenience later.  **Handout given on polyps**   YOU SHOULD EXPECT: Some feelings of bloating in the abdomen. Passage of more gas than usual.  Walking can help get rid of the air that was put into your GI tract during the procedure and reduce the bloating. If you had a lower endoscopy (such as a colonoscopy or flexible sigmoidoscopy) you may notice spotting of blood in your stool or on the toilet paper. If you underwent a bowel prep for your procedure, you may not have a normal bowel movement for a few days.  Please Note:  You might notice some irritation and congestion in your nose or some drainage.  This is from the oxygen used during your procedure.  There is no need for concern and it should clear up in a day or so.  SYMPTOMS TO REPORT IMMEDIATELY:   Following lower endoscopy (colonoscopy or flexible sigmoidoscopy):  Excessive amounts of blood in the stool  Significant tenderness or worsening of abdominal pains  Swelling of the abdomen that is new, acute  Fever of 100F or higher   For urgent or emergent issues, a gastroenterologist can be reached at any hour by calling [phone].   DIET:  We do recommend a small meal at first, but then you may proceed to your regular diet.  Drink plenty of fluids but you should avoid alcoholic beverages for 24 hours.  ACTIVITY:  You should plan to take it easy for the rest of today and you should NOT DRIVE or use heavy machinery until tomorrow (because of the sedation  medicines used during the test).    FOLLOW UP: Our staff will call the number listed on your records 48-72 hours following your procedure to check on you and address any questions or concerns that you may have regarding the information given to you following your procedure. If we do not reach you, we will leave a message.  We will attempt to reach you two times.  During this call, we will ask if you have developed any symptoms of COVID 19. If you develop any symptoms (ie: fever, flu-like symptoms, shortness of breath, cough etc.) before then, please call [phone].  If you test positive for Covid 19 in the 2 weeks post procedure, please call and report this information to us.    If any biopsies were taken you will be contacted by phone or by letter within the next 1-3 weeks.  Please call us at [phone] if you have not heard about the biopsies in 3 weeks.    SIGNATURES/CONFIDENTIALITY: You and/or your care partner have signed paperwork which will be entered into your electronic medical record.  These signatures attest to the fact that that the information above on your After Visit Summary has been reviewed and is understood.  Full responsibility of the confidentiality of this discharge information lies with you and/or your care-partner. 

## 2019-04-10 ENCOUNTER — Telehealth: Payer: Self-pay

## 2019-04-10 NOTE — Telephone Encounter (Signed)
Called [phone] and left a messaged we tried to reach pt for a follow up call. maw

## 2019-04-10 NOTE — Telephone Encounter (Signed)
No answer, left message to call if having any issues or concerns, B.Burlin Mcnair RN 

## 2019-04-13 ENCOUNTER — Encounter: Payer: Self-pay | Admitting: Gastroenterology

## 2019-05-06 ENCOUNTER — Other Ambulatory Visit: Payer: Self-pay | Admitting: Family Medicine

## 2019-05-14 ENCOUNTER — Other Ambulatory Visit: Payer: Self-pay

## 2019-05-14 ENCOUNTER — Other Ambulatory Visit (INDEPENDENT_AMBULATORY_CARE_PROVIDER_SITE_OTHER): Payer: Commercial Managed Care - PPO

## 2019-05-14 DIAGNOSIS — E119 Type 2 diabetes mellitus without complications: Secondary | ICD-10-CM

## 2019-05-14 DIAGNOSIS — E785 Hyperlipidemia, unspecified: Secondary | ICD-10-CM

## 2019-05-14 LAB — BASIC METABOLIC PANEL
BUN: 16 mg/dL (ref 6–23)
CO2: 30 mEq/L (ref 19–32)
Calcium: 9.5 mg/dL (ref 8.4–10.5)
Chloride: 103 mEq/L (ref 96–112)
Creatinine, Ser: 1.12 mg/dL (ref 0.40–1.50)
GFR: 82.4 mL/min (ref >60.00)
Glucose, Bld: 98 mg/dL (ref 70–99)
Potassium: 4.9 mEq/L (ref 3.5–5.1)
Sodium: 140 mEq/L (ref 135–145)

## 2019-05-14 LAB — HEMOGLOBIN A1C: Hgb A1c MFr Bld: 6.1 % (ref 4.6–6.5)

## 2019-05-14 LAB — LIPID PANEL
Cholesterol: 176 mg/dL (ref 0–200)
HDL: 42.9 mg/dL (ref >39.00)
LDL Cholesterol: 114 mg/dL — ABNORMAL HIGH (ref 0–99)
NonHDL: 133.5
Total CHOL/HDL Ratio: 4
Triglycerides: 97 mg/dL (ref 0.0–149.0)
VLDL: 19.4 mg/dL (ref 0.0–40.0)

## 2019-05-16 ENCOUNTER — Other Ambulatory Visit: Payer: Self-pay

## 2019-05-16 ENCOUNTER — Ambulatory Visit (INDEPENDENT_AMBULATORY_CARE_PROVIDER_SITE_OTHER): Payer: Commercial Managed Care - PPO | Admitting: Family Medicine

## 2019-05-16 ENCOUNTER — Encounter: Payer: Self-pay | Admitting: Family Medicine

## 2019-05-16 VITALS — BP 110/70 | HR 66 | Temp 97.6°F | Ht 71.5 in | Wt 250.5 lb

## 2019-05-16 DIAGNOSIS — E119 Type 2 diabetes mellitus without complications: Secondary | ICD-10-CM

## 2019-05-16 DIAGNOSIS — E785 Hyperlipidemia, unspecified: Secondary | ICD-10-CM | POA: Diagnosis not present

## 2019-05-16 HISTORY — DX: Type 2 diabetes mellitus without complications: E11.9

## 2019-05-16 NOTE — Progress Notes (Signed)
Clarence Alvarez T. Albie Bazin, MD Primary Care and [place] at [facility] [place], [address] Phone: [phone]  FAX: [phone]  SAAID YOUNGBLUT - 53 y.o. male  MRN [id number]  Date of Birth: 09-May-1966  Visit Date: 05/16/2019  PCP: Owens Loffler, MD  Referred by: Owens Loffler, MD  Chief Complaint  Patient presents with  . Diabetes   Subjective:   Clarence Alvarez is a 53 y.o. very pleasant male patient who presents with the following:  Diabetes Mellitus: Tolerating Medications: none Compliance with diet: fair, Body mass index is 34.45 kg/m. Exercie daily Avg blood sugars at home: not checking Foot problems: none Hypoglycemia: none No nausea, vomitting, blurred vision, polyuria.  Lab Results  Component Value Date   HGBA1C 6.1 05/14/2019   HGBA1C 6.7 (H) 02/05/2019   HGBA1C 6.1 08/26/2016   Lab Results  Component Value Date   LDLCALC 114 (H) 05/14/2019   CREATININE 1.12 05/14/2019    Wt Readings from Last 3 Encounters:  05/16/19 250 lb 8 oz (113.6 kg)  04/06/19 264 lb 6.4 oz (119.9 kg)  03/23/19 264 lb 6.4 oz (119.9 kg)    The 10-year ASCVD risk score Mikey Bussing DC Jr., et al., 2013) is: 14.7%   Values used to calculate the score:     Age: 53 years     Sex: Male     Is Non-Hispanic African American: Yes     Diabetic: Yes     Tobacco smoker: No     Systolic Blood Pressure: [id number] mmHg     Is BP treated: Yes     HDL Cholesterol: 42.9 mg/dL     Total Cholesterol: 176 mg/dL   R swelling in chest - follow  Lipids: Doing well, stable. No meds Panel reviewed with patient.  Lipids:    Component Value Date/Time   CHOL 176 05/14/2019 0819   TRIG 97.0 05/14/2019 0819   HDL 42.90 05/14/2019 0819   LDLDIRECT 76.0 02/05/2019 0816   VLDL 19.4 05/14/2019 0819   CHOLHDL 4 05/14/2019 0819    Lab Results  Component Value Date   ALT 33 02/05/2019   AST 42 (H) 02/05/2019   ALKPHOS 79 02/05/2019   BILITOT  0.7 02/05/2019     Past Medical History, Surgical History, Social History, Family History, Problem List, Medications, and Allergies have been reviewed and updated if relevant.  Patient Active Problem List   Diagnosis Date Noted  . Traumatic complete tear of right rotator cuff 11/30/2018  . Essential hypertension 03/20/2018  . OSA (obstructive sleep apnea) 01/26/2013  . ALLERGIC RHINITIS, SEASONAL 09/15/2009  . Asthma 09/08/2006    Past Medical History:  Diagnosis Date  . Allergic rhinitis, seasonal   . Allergy   . Arthritis    right knee  . GERD (gastroesophageal reflux disease)   . History of adenomatous polyp of colon    03-25-2014  tubular adenoma's /  hyperplastic  . History of prostatitis   . Hypertension   . Lactose intolerance   . Left ureteral stone    2018  . Mild obstructive sleep apnea    per study 2014, pt states he does not have sleep apnea  . Moderate persistent asthma    followed by pcp-- uncomplicated  . MRSA infection 2010   buttocks  . Sleep apnea    no CPAP used  . Traumatic complete tear of right rotator cuff 11/30/2018    Past Surgical History:  Procedure  Laterality Date  . [place] STUDY N/A 05/04/2018   Procedure: [place] STUDY;  Surgeon: Jonathon Bellows, MD;  Location: [facility] END[facility]COPY;  Service: Gastroenterology;  Laterality: N/A;  . anterior cervucal  2017  . COLONOSCOPY  03/25/2014  . ESOPHAGEAL MANOMETRY N/A 05/04/2018   Procedure: ESOPHAGEAL MANOMETRY (EM);  Surgeon: Jonathon Bellows, MD;  Location: [facility] ENDOSCOPY;  Service: Gastroenterology;  Laterality: N/A;  . ESOPHAGOGASTRODUODENOSCOPY (EGD) WITH PROPOFOL N/A 02/24/2018   Procedure: ESOPHAGOGASTRODUODENOSCOPY (EGD) WITH PROPOFOL;  Surgeon: Jonathon Bellows, MD;  Location: [facility] ENDOSCOPY;  Service: Gastroenterology;  Laterality: N/A;  . KNEE SURGERY Right 2016  . RESECTION DISTAL CLAVICAL Right 12/06/2018   Procedure: RESECTION DISTAL CLAVICAL AND DEBRIDEMENT OF THE SHOULDER;  Surgeon: Elsie Saas, MD;  Location: [place];  Service: Orthopedics;  Laterality: Right;  SCALENE BLOCK  . SHOULDER ARTHROSCOPY WITH ROTATOR CUFF REPAIR AND SUBACROMIAL DECOMPRESSION Left 07-24-2010  dr Noemi Chapel @  [facility]   and Labrum tear debridement  . SHOULDER ARTHROSCOPY WITH ROTATOR CUFF REPAIR AND SUBACROMIAL DECOMPRESSION Right 12/06/2018   Procedure: SHOULDER ARTHROSCOPY WITH ROTATOR CUFF REPAIR AND SUBACROMIAL DECOMPRESSION;  Surgeon: Elsie Saas, MD;  Location: [place];  Service: Orthopedics;  Laterality: Right;  SCALENE BLOCK  . UPPER GASTROINTESTINAL ENDOSCOPY      Social History   Socioeconomic History  . Marital status: Divorced    Spouse name: Not on file  . Number of children: Not on file  . Years of education: Not on file  . Highest education level: Not on file  Occupational History  . 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 Financial resource strain: Not on file  . Food insecurity    Worry: Not on file    Inability: Not on file  . Transportation needs    Medical: Not on file    Non-medical: Not on file  Tobacco Use  . Smoking status: Former Smoker    Years: 3.00    Types: Cigarettes    Quit date: 07/12/1984    Years since quitting: 34.8  . Smokeless tobacco: Former Systems developer    Types: Snuff    Quit date: 2017  . Tobacco comment: "EXPERIMENTED" W/SMOKING--NOT EVEN A PACK X 1 WEEK.   Substance and Sexual Activity  . Alcohol use: Yes    Alcohol/week: 3.0 standard drinks    Types: 3 Shots of liquor per week    Comment: 1-2 drinks per week  . Drug use: No  . Sexual activity: Not on file    Comment: vasectomy-- 2010  Lifestyle  . Physical activity    Days per week: Not on file    Minutes per session: Not on file  . Stress: Not on file  Relationships  . Social Herbalist on phone: Not on file    Gets together: Not on file    Attends  religious service: Not on file    Active member of club or organization: Not on file    Attends meetings of clubs or organizations: Not on file    Relationship status: Not on file  . Intimate partner violence    Fear of current or ex partner: Not on file    Emotionally abused: Not on file    Physically abused: Not on file    Forced sexual activity: Not on file  Other Topics Concern  . Not on  file  Social History Narrative  . Not on file    Family History  Problem Relation Age of Onset  . Diabetes Maternal Grandmother   . Colon cancer Maternal Grandmother        dx age 59 - recurance at age 54  . Diabetes Mother   . Hepatitis C Mother   . Prostate cancer Father   . Colon cancer Father        dx - 27's  . Diabetes Paternal Grandmother   . Mental illness Brother   . Mental illness Sister   . Mental illness Daughter   . Stomach cancer Maternal Grandfather   . COPD Other        Aunt  . Diabetes Other        siblings  . Pancreatic cancer Paternal Aunt   . Esophageal cancer Neg Hx   . Rectal cancer Neg Hx     No Known Allergies  Medication list reviewed and updated in full in [place].   GEN: No acute illnesses, no fevers, chills. GI: No n/v/d, eating normally Pulm: No SOB Interactive and getting along well at home.  Otherwise, ROS is as per the HPI.  Objective:   BP 110/70   Pulse 66   Temp 97.6 F (36.4 C) (Temporal)   Ht 5' 11.5" (1.816 m)   Wt 250 lb 8 oz (113.6 kg)   SpO2 96%   BMI 34.45 kg/m   GEN: WDWN, NAD, Non-toxic, A & O x 3 HEENT: Atraumatic, Normocephalic. Neck supple. No masses, No LAD. Ears and Nose: No external deformity. EXTR: No c/c/e NEURO Normal gait.  PSYCH: Normally interactive. Conversant. Not depressed or anxious appearing.  Calm demeanor.  Chest: there is mild enlargement around the nipple slightly larger than the contralateral side  Laboratory and Imaging Data: Results for orders placed or performed in visit on  05/14/19  Hemoglobin A1c  Result Value Ref Range   Hgb A1c MFr Bld 6.1 4.6 - 6.5 %  Basic metabolic panel  Result Value Ref Range   Sodium 140 135 - 145 mEq/L   Potassium 4.9 3.5 - 5.1 mEq/L   Chloride 103 96 - 112 mEq/L   CO2 30 19 - 32 mEq/L   Glucose, Bld 98 70 - 99 mg/dL   BUN 16 6 - 23 mg/dL   Creatinine, Ser 1.12 0.40 - 1.50 mg/dL   Calcium 9.5 8.4 - 10.5 mg/dL   GFR 82.40 >60.00 mL/min  Lipid panel  Result Value Ref Range   Cholesterol 176 0 - 200 mg/dL   Triglycerides 97.0 0.0 - 149.0 mg/dL   HDL 42.90 >39.00 mg/dL   VLDL 19.4 0.0 - 40.0 mg/dL   LDL Cholesterol 114 (H) 0 - 99 mg/dL   Total CHOL/HDL Ratio 4    NonHDL 133.50      Assessment and Plan:     ICD-10-CM   1. Diabetes mellitus type 2, diet-controlled (HCC)  E11.9 Hemoglobin A1c  2. Hyperlipidemia, unspecified hyperlipidemia type  E78.5 Lipid panel   Recheck blood work in 2 months Great off of all meds  3 weeks only at mild enlargement - will follow, call back if enlargig or changing in 4-6 weeks, or not resolved  Follow-up: No follow-ups on file.  No orders of the defined types were placed in this encounter.  Orders Placed This Encounter  Procedures  . Hemoglobin A1c  . Lipid panel    Signed,  Frederico Hamman T. Aseret Hoffman, MD   Outpatient  Encounter Medications as of 05/16/2019  Medication Sig  . amLODipine (NORVASC) 5 MG tablet TAKE 1 TABLET BY MOUTH EVERY DAY  . Ascorbic Acid (VITAMIN C) 100 MG tablet Take 1,000 mg by mouth daily.   . cetirizine (ZYRTEC) 10 MG tablet Take 10 mg by mouth every morning.   Marland Kitchen FLOVENT HFA 110 MCG/ACT inhaler INHALE 1 PUFF BY MOUTH TWICE A DAY  . fluticasone (FLONASE) 50 MCG/ACT nasal spray Place 2 sprays into both nostrils daily.  Marland Kitchen omeprazole (PRILOSEC) 20 MG capsule Take 1 capsule (20 mg total) by mouth daily.  Marland Kitchen PROAIR HFA 108 (90 Base) MCG/ACT inhaler INHALE 2 PUFFS 4 TIMES A DAY  . [DISCONTINUED] albuterol (PROVENTIL,VENTOLIN) 90 MCG/ACT inhaler Inhale 2 puffs into  the lungs every 6 (six) hours as needed for wheezing.   No facility-administered encounter medications on file as of 05/16/2019.

## 2019-05-17 ENCOUNTER — Other Ambulatory Visit: Payer: Self-pay | Admitting: Family Medicine

## 2019-06-12 ENCOUNTER — Other Ambulatory Visit: Payer: Self-pay

## 2019-06-12 DIAGNOSIS — Z20822 Contact with and (suspected) exposure to covid-19: Secondary | ICD-10-CM

## 2019-06-14 LAB — NOVEL CORONAVIRUS, NAA: SARS-CoV-2, NAA: NOT DETECTED

## 2019-06-18 ENCOUNTER — Telehealth: Payer: Self-pay | Admitting: Family Medicine

## 2019-06-18 DIAGNOSIS — R222 Localized swelling, mass and lump, trunk: Secondary | ICD-10-CM

## 2019-06-18 NOTE — Telephone Encounter (Signed)
Patient called.  Patient had an office visit with Dr.Copland in November and there was an enlargement on right chest.  Dr.Copland told patient to monitor it and let him know of any changes.  Patient said it's gotten larger and it's tender around the nipple area.  Patient can be reached on his work cell phone or personal cell phone.

## 2019-06-19 ENCOUNTER — Other Ambulatory Visit: Payer: Self-pay | Admitting: Family Medicine

## 2019-06-19 DIAGNOSIS — R222 Localized swelling, mass and lump, trunk: Secondary | ICD-10-CM

## 2019-06-19 NOTE — Telephone Encounter (Signed)
diag mammo ordered, please let him know

## 2019-06-19 NOTE — Telephone Encounter (Signed)
Mr. Cianciulli notified as instructed by telephone.

## 2019-06-29 ENCOUNTER — Other Ambulatory Visit: Payer: Self-pay

## 2019-06-29 ENCOUNTER — Ambulatory Visit
Admission: RE | Admit: 2019-06-29 | Discharge: 2019-06-29 | Disposition: A | Discharge status: Home / Self Care | Payer: Commercial Managed Care - PPO | Source: Ambulatory Visit | Attending: Family Medicine | Admitting: Family Medicine

## 2019-06-29 ENCOUNTER — Ambulatory Visit: Payer: Commercial Managed Care - PPO

## 2019-06-29 DIAGNOSIS — R222 Localized swelling, mass and lump, trunk: Secondary | ICD-10-CM

## 2019-07-09 ENCOUNTER — Other Ambulatory Visit: Payer: Self-pay

## 2019-07-09 ENCOUNTER — Other Ambulatory Visit (INDEPENDENT_AMBULATORY_CARE_PROVIDER_SITE_OTHER): Payer: Commercial Managed Care - PPO

## 2019-07-09 DIAGNOSIS — E785 Hyperlipidemia, unspecified: Secondary | ICD-10-CM | POA: Diagnosis not present

## 2019-07-09 DIAGNOSIS — E119 Type 2 diabetes mellitus without complications: Secondary | ICD-10-CM | POA: Diagnosis not present

## 2019-07-09 LAB — LIPID PANEL
Cholesterol: 187 mg/dL (ref 0–200)
HDL: 35.7 mg/dL — ABNORMAL LOW (ref >39.00)
LDL Cholesterol: 117 mg/dL — ABNORMAL HIGH (ref 0–99)
NonHDL: 151.75
Total CHOL/HDL Ratio: 5
Triglycerides: 174 mg/dL — ABNORMAL HIGH (ref 0.0–149.0)
VLDL: 34.8 mg/dL (ref 0.0–40.0)

## 2019-07-09 LAB — HEMOGLOBIN A1C: Hgb A1c MFr Bld: 6 % (ref 4.6–6.5)

## 2019-07-26 LAB — HM DIABETES EYE EXAM

## 2019-08-24 ENCOUNTER — Ambulatory Visit: Payer: Commercial Managed Care - PPO | Attending: Internal Medicine

## 2019-08-24 DIAGNOSIS — Z20822 Contact with and (suspected) exposure to covid-19: Secondary | ICD-10-CM

## 2019-08-25 LAB — NOVEL CORONAVIRUS, NAA: SARS-CoV-2, NAA: NOT DETECTED

## 2019-09-19 ENCOUNTER — Other Ambulatory Visit: Payer: Self-pay

## 2019-09-19 ENCOUNTER — Ambulatory Visit (INDEPENDENT_AMBULATORY_CARE_PROVIDER_SITE_OTHER): Payer: Commercial Managed Care - PPO | Admitting: Family Medicine

## 2019-09-19 ENCOUNTER — Telehealth: Payer: Self-pay | Admitting: *Deleted

## 2019-09-19 ENCOUNTER — Encounter: Payer: Self-pay | Admitting: Family Medicine

## 2019-09-19 VITALS — BP 181/82 | Temp 97.5°F | Ht 71.5 in | Wt 240.0 lb

## 2019-09-19 DIAGNOSIS — J4521 Mild intermittent asthma with (acute) exacerbation: Secondary | ICD-10-CM | POA: Diagnosis not present

## 2019-09-19 MED ORDER — PREDNISONE 20 MG PO TABS: ORAL_TABLET | ORAL | 0 refills | Status: DC

## 2019-09-19 NOTE — Progress Notes (Signed)
     Azrael Maddix T. Lior Cartelli, MD Primary Care and [place] at [facility] [place], [address] Phone: [phone]  FAX: [phone]  Clarence Alvarez - 54 y.o. male  MRN [id number]  Date of Birth: 1966-03-10  Visit Date: 09/19/2019  PCP: Owens Loffler, MD  Referred by: Owens Loffler, MD Chief Complaint  Patient presents with  . Asthma    Chest Tightness & Mucous.  Has had 5 days of prednisone (see phone note)   Virtual Visit via Video Note:  I connected with  Clarence Alvarez on 09/19/2019 12:00 PM EST by a video enabled telemedicine application and verified that I am speaking with the correct person using two identifiers.   Location patient: home computer, tablet, or smartphone Location provider: work or home office Consent: Verbal consent directly obtained from Clarence Alvarez. Persons participating in the virtual visit: patient, provider  I discussed the limitations of evaluation and management by telemedicine and the availability of in person appointments. The patient expressed understanding and agreed to proceed.  History of Present Illness:  Patient has some done significant asthma and he uses Flovent twice daily as a preventative.  Right now the last few days he has had to take albuterol 4-5 times a day he feels tight in his chest.  He is also had some extensive mucus production.  Currently has a few days of prednisone and 40 which is seem to help, but today is his last day.  He also has a lot of nasal congestion.  He does note that he has quite a bit of allergic response and he is also taking some antihistamines.  Review of Systems as above: See pertinent positives and pertinent negatives per HPI No acute distress verbally   Observations/Objective/Exam:  An attempt was made to discern vital signs over the phone and per patient if applicable and possible.   General:    Alert, Oriented, appears well and in no acute  distress  Pulmonary:     On inspection no signs of respiratory distress.  Psych / Neurological:     Pleasant and cooperative.  Assessment and Plan:    ICD-10-CM   1. Mild intermittent asthma with exacerbation  J45.21    Acute asthma exacerbation in the setting of illness, allergy driven, cannot exclude COVID-19 vaccine.  I discussed the assessment and treatment plan with the patient. The patient was provided an opportunity to ask questions and all were answered. The patient agreed with the plan and demonstrated an understanding of the instructions.   The patient was advised to call back or seek an in-person evaluation if the symptoms worsen or if the condition fails to improve as anticipated.  Follow-up: prn unless noted otherwise below No follow-ups on file.  Meds ordered this encounter  Medications  . predniSONE (DELTASONE) 20 MG tablet    Sig: 3 tabs po daily for 3 days, then 2 tabs po daily for 3 days, then 1 tab po daily for 3 days    Dispense:  18 tablet    Refill:  0   No orders of the defined types were placed in this encounter.   Signed,  Maud Deed. Maryetta Shafer, MD

## 2019-09-19 NOTE — Telephone Encounter (Signed)
Spoke to patient and a virtual visit was scheduled today with Dr. [name] at 12:00 noon.

## 2019-09-19 NOTE — Telephone Encounter (Signed)
Patient left a voicemail stating that he is having a flare-up with his asthma. Patient stated that he recently took his covid vaccine and not sure if that has caused some of his problems. Patient stated that an advocate gave him five days of prednisone and he does not feel it is enough. Patient stated that he is having some mucus and tightness in his chest. Patient requested additional Prednisone. Called patient and got his voicemail. Left message for patient to call back ASAP. Patient will need a virtual visit and Dr. [name] has an opening today at noon.

## 2019-09-22 ENCOUNTER — Other Ambulatory Visit: Payer: Self-pay | Admitting: Family Medicine

## 2019-09-28 ENCOUNTER — Other Ambulatory Visit: Payer: Self-pay | Admitting: Family Medicine

## 2019-09-28 ENCOUNTER — Ambulatory Visit: Payer: Commercial Managed Care - PPO | Admitting: Family Medicine

## 2019-09-28 ENCOUNTER — Telehealth: Payer: Self-pay

## 2019-09-28 MED ORDER — PREDNISONE 20 MG PO TABS: ORAL_TABLET | ORAL | 0 refills | Status: DC

## 2019-09-28 MED ORDER — ALBUTEROL SULFATE (2.5 MG/3ML) 0.083% IN NEBU: 2.5000 mg | INHALATION_SOLUTION | Freq: Four times a day (QID) | RESPIRATORY_TRACT | 1 refills | Status: DC | PRN

## 2019-09-28 NOTE — Telephone Encounter (Signed)
Pt notified as instructed that neb machine rx is at front desk [facility] for pick up and pt will take to DME store and neb solution and prednisone has been sent to [facility]. Pt was most appreciative and feels reassured and at ease about getting covid vaccine on 09/29/19.

## 2019-09-28 NOTE — Telephone Encounter (Signed)
Rx written for nebulizer machine which he will need to take to DME.   Sent albuterol neb to pharmacy.

## 2019-09-28 NOTE — Telephone Encounter (Signed)
Reasonable to have prednisone Rx on hand - fill if needed.  Does he need nebulizer machine or just albuterol neb Rx for home?

## 2019-09-28 NOTE — Telephone Encounter (Signed)
Pt needed a nebulizer machine and the albuterol for the nebulizer for home use.

## 2019-09-28 NOTE — Telephone Encounter (Signed)
Pt had first covid vaccine on 09/08/19 which pt feels exacerbated asthma. Pt had virtual visit with Dr [name] on 09/19/19. Pt has been taking Prednisone for 2 wks and pt is taking last dose of Prednisone 09/28/19.pt is better but still having some respiratory symptoms; pt still has tightness in chest but albuterol is helping; pt using albuterol 2 - 3 x a day instead of 4-5 x a day as when seen on 09/19/19. Pt has wheezing upon exertion and continues with dry cough and runny nose. No fever,head or chest congestion,or SOB. Pt said he saw [facility] end of 2020 and was recommended to continue Flovent,Zyrtec, asthma maintenance meds and albuterol 2 -3 times daily. Pt did not have to schedule FU with pulmonology. Dr [name] out of office and pt wanting to know if should take 2nd covid vaccine that is scheduled for 09/29/19. I spoke with Dr Danise Mina; He said sounds like pt condition has improved and he said to get total coverage from covid vaccine it should be OK to take 2nd covid vaccine on 09-29-19.Dr Danise Mina said to observe for worsening symptoms of asthma; because pts asthma was exacerbated by 1st covid vaccine does not mean he would have a problem with 2nd vaccine.if pt does have worsening symptoms to be evaluated.pt voiced understanding but still has some concerns or reservations about getting 2nd covid vaccine and request to send note to Dr [name] to get his recommendation of whether to take the 2nd covid vaccine on 09/29/19 or not; to see if Dr [name] would send in prednisone to have on hand in case pt needed it if he does take the 2nd covid vaccine and pt wants to know if could get in home nebulizer. Pt said can respond by mychart or by phone. [facility].

## 2019-10-20 ENCOUNTER — Other Ambulatory Visit: Payer: Self-pay | Admitting: Family Medicine

## 2020-02-12 ENCOUNTER — Other Ambulatory Visit: Payer: Self-pay | Admitting: Family Medicine

## 2020-02-12 DIAGNOSIS — E119 Type 2 diabetes mellitus without complications: Secondary | ICD-10-CM

## 2020-02-12 DIAGNOSIS — E785 Hyperlipidemia, unspecified: Secondary | ICD-10-CM

## 2020-02-12 DIAGNOSIS — Z125 Encounter for screening for malignant neoplasm of prostate: Secondary | ICD-10-CM

## 2020-02-12 DIAGNOSIS — I10 Essential (primary) hypertension: Secondary | ICD-10-CM

## 2020-02-12 DIAGNOSIS — Z79899 Other long term (current) drug therapy: Secondary | ICD-10-CM

## 2020-02-19 ENCOUNTER — Other Ambulatory Visit: Payer: Self-pay

## 2020-02-19 ENCOUNTER — Other Ambulatory Visit (INDEPENDENT_AMBULATORY_CARE_PROVIDER_SITE_OTHER): Payer: Commercial Managed Care - PPO

## 2020-02-19 DIAGNOSIS — Z125 Encounter for screening for malignant neoplasm of prostate: Secondary | ICD-10-CM

## 2020-02-19 DIAGNOSIS — E785 Hyperlipidemia, unspecified: Secondary | ICD-10-CM

## 2020-02-19 DIAGNOSIS — Z79899 Other long term (current) drug therapy: Secondary | ICD-10-CM | POA: Diagnosis not present

## 2020-02-19 DIAGNOSIS — E119 Type 2 diabetes mellitus without complications: Secondary | ICD-10-CM

## 2020-02-19 LAB — MICROALBUMIN / CREATININE URINE RATIO
Creatinine,U: 190 mg/dL
Microalb Creat Ratio: 0.4 mg/g (ref 0.0–30.0)
Microalb, Ur: <0.7 mg/dL (ref 0.0–1.9)

## 2020-02-19 LAB — CBC WITH DIFFERENTIAL/PLATELET
Basophils Absolute: 0 10*3/uL (ref 0.0–0.1)
Basophils Relative: 1.4 % (ref 0.0–3.0)
Eosinophils Absolute: 0.2 10*3/uL (ref 0.0–0.7)
Eosinophils Relative: 5.8 % — ABNORMAL HIGH (ref 0.0–5.0)
HCT: 45.3 % (ref 39.0–52.0)
Hemoglobin: 15.4 g/dL (ref 13.0–17.0)
Lymphocytes Relative: 56.2 % — ABNORMAL HIGH (ref 12.0–46.0)
Lymphs Abs: 1.5 10*3/uL (ref 0.7–4.0)
MCHC: 34 g/dL (ref 30.0–36.0)
MCV: 91.1 fl (ref 78.0–100.0)
Monocytes Absolute: 0.3 10*3/uL (ref 0.1–1.0)
Monocytes Relative: 10.4 % (ref 3.0–12.0)
Neutro Abs: 0.7 10*3/uL — ABNORMAL LOW (ref 1.4–7.7)
Neutrophils Relative %: 26.2 % — ABNORMAL LOW (ref 43.0–77.0)
Platelets: 168 10*3/uL (ref 150.0–400.0)
RBC: 4.97 Mil/uL (ref 4.22–5.81)
RDW: 13.7 % (ref 11.5–15.5)
WBC: 2.8 10*3/uL — ABNORMAL LOW (ref 4.0–10.5)

## 2020-02-19 LAB — HEPATIC FUNCTION PANEL
ALT: 36 U/L (ref 0–53)
AST: 36 U/L (ref 0–37)
Albumin: 4.3 g/dL (ref 3.5–5.2)
Alkaline Phosphatase: 56 U/L (ref 39–117)
Bilirubin, Direct: 0.1 mg/dL (ref 0.0–0.3)
Total Bilirubin: 0.4 mg/dL (ref 0.2–1.2)
Total Protein: 7.4 g/dL (ref 6.0–8.3)

## 2020-02-19 LAB — BASIC METABOLIC PANEL
BUN: 16 mg/dL (ref 6–23)
CO2: 33 mEq/L — ABNORMAL HIGH (ref 19–32)
Calcium: 9.8 mg/dL (ref 8.4–10.5)
Chloride: 101 mEq/L (ref 96–112)
Creatinine, Ser: 1.25 mg/dL (ref 0.40–1.50)
GFR: 72.38 mL/min (ref >60.00)
Glucose, Bld: 125 mg/dL — ABNORMAL HIGH (ref 70–99)
Potassium: 4.6 mEq/L (ref 3.5–5.1)
Sodium: 140 mEq/L (ref 135–145)

## 2020-02-19 LAB — LIPID PANEL
Cholesterol: 170 mg/dL (ref 0–200)
HDL: 33.6 mg/dL — ABNORMAL LOW (ref >39.00)
LDL Cholesterol: 120 mg/dL — ABNORMAL HIGH (ref 0–99)
NonHDL: 136.72
Total CHOL/HDL Ratio: 5
Triglycerides: 85 mg/dL (ref 0.0–149.0)
VLDL: 17 mg/dL (ref 0.0–40.0)

## 2020-02-19 LAB — HEMOGLOBIN A1C: Hgb A1c MFr Bld: 5.9 % (ref 4.6–6.5)

## 2020-02-20 LAB — PSA, TOTAL WITH REFLEX TO PSA, FREE: PSA, Total: 0.4 ng/mL (ref <=4.0)

## 2020-02-27 ENCOUNTER — Other Ambulatory Visit: Payer: Self-pay

## 2020-02-27 ENCOUNTER — Ambulatory Visit (INDEPENDENT_AMBULATORY_CARE_PROVIDER_SITE_OTHER): Payer: Commercial Managed Care - PPO | Admitting: Family Medicine

## 2020-02-27 ENCOUNTER — Encounter: Payer: Self-pay | Admitting: Family Medicine

## 2020-02-27 VITALS — BP 129/84 | HR 71 | Temp 98.1°F | Ht 71.5 in | Wt 249.5 lb

## 2020-02-27 DIAGNOSIS — Z0000 Encounter for general adult medical examination without abnormal findings: Secondary | ICD-10-CM | POA: Diagnosis not present

## 2020-02-27 NOTE — Progress Notes (Signed)
Clarence Notarianni T. Vali Capano, MD, [name] at [facility] [place], [address]  Phone: [phone]  FAX: [phone]  Clarence Alvarez - 54 y.o. male  MRN [id number]  Date of Birth: [birth year]  Date: 02/27/2020  PCP: Owens Loffler, MD  Referral: Owens Loffler, MD  Chief Complaint  Patient presents with  . Annual Exam    This visit occurred during the SARS-CoV-2 public health emergency.  Safety protocols were in place, including screening questions prior to the visit, additional usage of staff PPE, and extensive cleaning of exam room while observing appropriate contact time as indicated for disinfecting solutions.   Patient Care Team: Owens Loffler, MD as PCP - General (Family Medicine) Subjective:   Clarence Alvarez is a 54 y.o. pleasant patient who presents with the following:  Preventative Health Maintenance Visit:  Health Maintenance Summary Reviewed and updated, unless pt declines services.  Tobacco History Reviewed. Alcohol noe Exercise Habits: Some activity, rec at least 30 mins 5 times a week STD concerns: no risk or activity to increase risk Drug Use: None  Health Maintenance  Topic Date Due  . FOOT EXAM  Never done  . OPHTHALMOLOGY EXAM  Never done  . INFLUENZA VACCINE  02/10/2020  . HEMOGLOBIN A1C  08/21/2020  . URINE MICROALBUMIN  02/18/2021  . COLONOSCOPY  04/05/2022  . TETANUS/TDAP  12/13/2027  . PNEUMOCOCCAL POLYSACCHARIDE VACCINE AGE 59-64 HIGH RISK  Completed  . COVID-19 Vaccine  Completed  . Hepatitis C Screening  Completed  . HIV Screening  Completed   Immunization History  Administered Date(s) Administered  . Influenza Whole 04/11/2012  . Influenza-Unspecified 03/12/2016, 04/10/2017, 03/12/2018, 04/06/2019  . PFIZER SARS-COV-2 Vaccination 09/08/2019, 09/29/2019  . Pneumococcal Polysaccharide-23 10/02/2018  . Td 05/12/2004  . Tdap 12/12/2017    Diabetes Mellitus: Tolerating Medications: yes Compliance with diet: fair, Body mass index is 34.31 kg/m. Exercise: minimal / intermittent Avg blood sugars at home: not checking Foot problems: none Hypoglycemia: none No nausea, vomitting, blurred vision, polyuria.  Lab Results  Component Value Date   HGBA1C 5.9 02/19/2020   HGBA1C 6.0 07/09/2019   HGBA1C 6.1 05/14/2019   Lab Results  Component Value Date   MICROALBUR <0.7 02/19/2020   LDLCALC 120 (H) 02/19/2020   CREATININE 1.25 02/19/2020    Wt Readings from Last 3 Encounters:  02/27/20 249 lb 8 oz (113.2 kg)  09/19/19 240 lb (108.9 kg)  05/16/19 250 lb 8 oz (113.6 kg)     Patient Active Problem List   Diagnosis Date Noted  . Diabetes mellitus type 2, diet-controlled ([place]) 05/16/2019  . Traumatic complete tear of right rotator cuff 11/30/2018  . Essential hypertension 03/20/2018  . Degeneration of intervertebral disc of cervical spine without disc herniation 02/29/2016  . OSA (obstructive sleep apnea) 01/26/2013  . ALLERGIC RHINITIS, SEASONAL 09/15/2009  . Asthma 09/08/2006    Past Medical History:  Diagnosis Date  . Allergic rhinitis, seasonal   . Arthritis    right knee  . GERD (gastroesophageal reflux disease)   . History of adenomatous polyp of colon    03-25-2014  tubular adenoma's /  hyperplastic  . Hypertension   . Lactose intolerance   . Left ureteral stone    2018  . Mild obstructive sleep apnea    per study 2014, pt states he does not have sleep apnea  . Moderate persistent asthma    followed by pcp-- uncomplicated  .  MRSA infection 2010   buttocks  . Sleep apnea    no CPAP used  . Traumatic complete tear of right rotator cuff 11/30/2018    Past Surgical History:  Procedure Laterality Date  . [place] STUDY N/A 05/04/2018   Procedure: [place] STUDY;  Surgeon: Jonathon Bellows, MD;  Location: [facility] ENDOSCOPY;  Service: Gastroenterology;  Laterality: N/A;  . anterior cervucal  2017  .  COLONOSCOPY  03/25/2014  . ESOPHAGEAL MANOMETRY N/A 05/04/2018   Procedure: ESOPHAGEAL MANOMETRY (EM);  Surgeon: Jonathon Bellows, MD;  Location[facility] ENDOSCOPY;  Service: Gastroenterology;  Laterality: N/A;  . ESOPHAGOGASTRODUODENOSCOPY (EGD) WITH PROPOFOL N/A 02/24/2018   Procedure: ESOPHAGOGASTRODUODENOSCOPY (EGD) WITH PROPOFOL;  Surgeon: Jonathon Bellows, MD;  Location: [facility] ENDOSCOPY;  Service: Gastroenterology;  Laterality: N/A;  . KNEE SURGERY Right 2016  . RESECTION DISTAL CLAVICAL Right 12/06/2018   Procedure: RESECTION DISTAL CLAVICAL AND DEBRIDEMENT OF THE SHOULDER;  Surgeon: Elsie Saas, MD;  Location: [place];  Service: Orthopedics;  Laterality: Right;  SCALENE BLOCK  . SHOULDER ARTHROSCOPY WITH ROTATOR CUFF REPAIR AND SUBACROMIAL DECOMPRESSION Left 07-24-2010  dr Noemi Chapel @  [facility]   and Labrum tear debridement  . SHOULDER ARTHROSCOPY WITH ROTATOR CUFF REPAIR AND SUBACROMIAL DECOMPRESSION Right 12/06/2018   Procedure: SHOULDER ARTHROSCOPY WITH ROTATOR CUFF REPAIR AND SUBACROMIAL DECOMPRESSION;  Surgeon: Elsie Saas, MD;  Location: [place];  Service: Orthopedics;  Laterality: Right;  SCALENE BLOCK  . UPPER GASTROINTESTINAL ENDOSCOPY      Family History  Problem Relation Age of Onset  . Diabetes Maternal Grandmother   . Colon cancer Maternal Grandmother        dx age 45 - recurance at age 28  . Diabetes Mother   . Hepatitis C Mother   . Prostate cancer Father   . Colon cancer Father        dx - 61's  . Diabetes Paternal Grandmother   . Mental illness Brother   . Mental illness Sister   . Mental illness Daughter   . Stomach cancer Maternal Grandfather   . COPD Other        Aunt  . Diabetes Other        siblings  . Pancreatic cancer Paternal Aunt   . Esophageal cancer Neg Hx   . Rectal cancer Neg Hx     Past Medical History, Surgical History, Social History, Family History, Problem List, Medications, and Allergies have been reviewed and  updated if relevant.  Review of Systems: Pertinent positives are listed above.  Otherwise, a full 14 point review of systems has been done in full and it is negative except where it is noted positive.  Objective:   BP 129/84   Pulse 71   Temp 98.1 F (36.7 C) (Temporal)   Ht 5' 11.5" (1.816 m)   Wt 249 lb 8 oz (113.2 kg)   BMI 34.31 kg/m  Ideal Body Weight: Weight in (lb) to have BMI = 25: 181.4  Ideal Body Weight: Weight in (lb) to have BMI = 25: 181.4 No exam data present Depression screen [facility] 2/9 02/27/2020 02/08/2019 12/12/2017  Decreased Interest 0 0 0  Down, Depressed, Hopeless 0 0 1  PHQ - 2 Score 0 0 1     GEN: well developed, well nourished, no acute distress Eyes: conjunctiva and lids normal, PERRLA, EOMI ENT: TM clear, nares clear, oral exam WNL Neck: supple, no lymphadenopathy, no thyromegaly, no JVD Pulm: clear to auscultation and percussion, respiratory effort  normal CV: regular rate and rhythm, S1-S2, no murmur, rub or gallop, no bruits, peripheral pulses normal and symmetric, no cyanosis, clubbing, edema or varicosities GI: soft, non-tender; no hepatosplenomegaly, masses; active bowel sounds all quadrants GU: no hernia, testicular mass, penile discharge Lymph: no cervical, axillary or inguinal adenopathy MSK: gait normal, muscle tone and strength WNL, no joint swelling, effusions, discoloration, crepitus  SKIN: clear, good turgor, color WNL, no rashes, lesions, or ulcerations Neuro: normal mental status, normal strength, sensation, and motion Psych: alert; oriented to person, place and time, normally interactive and not anxious or depressed in appearance.  All labs reviewed with patient. Results for orders placed or performed in visit on 02/19/20  PSA, Total with Reflex to PSA, Free  Result Value Ref Range   PSA, Total 0.4 < OR = 4.0 ng/mL  Microalbumin / creatinine urine ratio  Result Value Ref Range   Microalb, Ur <0.7 0.0 - 1.9 mg/dL   Creatinine,U 190.0  mg/dL   Microalb Creat Ratio 0.4 0.0 - 30.0 mg/g  Hemoglobin A1c  Result Value Ref Range   Hgb A1c MFr Bld 5.9 4.6 - 6.5 %  CBC with Differential/Platelet  Result Value Ref Range   WBC 2.8 (L) 4.0 - 10.5 K/uL   RBC 4.97 4.22 - 5.81 Mil/uL   Hemoglobin 15.4 13.0 - 17.0 g/dL   HCT 45.3 39 - 52 %   MCV 91.1 78.0 - 100.0 fl   MCHC 34.0 30.0 - 36.0 g/dL   RDW 13.7 11.5 - 15.5 %   Platelets 168.0 150 - 400 K/uL   Neutrophils Relative % 26.2 (L) 43 - 77 %   Lymphocytes Relative 56.2 Repeated and verified X2. (H) 12 - 46 %   Monocytes Relative 10.4 3 - 12 %   Eosinophils Relative 5.8 (H) 0 - 5 %   Basophils Relative 1.4 0 - 3 %   Neutro Abs 0.7 (L) 1.4 - 7.7 K/uL   Lymphs Abs 1.5 0.7 - 4.0 K/uL   Monocytes Absolute 0.3 0 - 1 K/uL   Eosinophils Absolute 0.2 0 - 0 K/uL   Basophils Absolute 0.0 0 - 0 K/uL  Basic metabolic panel  Result Value Ref Range   Sodium 140 135 - 145 mEq/L   Potassium 4.6 3.5 - 5.1 mEq/L   Chloride 101 96 - 112 mEq/L   CO2 33 (H) 19 - 32 mEq/L   Glucose, Bld 125 (H) 70 - 99 mg/dL   BUN 16 6 - 23 mg/dL   Creatinine, Ser 1.25 0.40 - 1.50 mg/dL   GFR 72.38 >60.00 mL/min   Calcium 9.8 8.4 - 10.5 mg/dL  Hepatic function panel  Result Value Ref Range   Total Bilirubin 0.4 0.2 - 1.2 mg/dL   Bilirubin, Direct 0.1 0.0 - 0.3 mg/dL   Alkaline Phosphatase 56 39 - 117 U/L   AST 36 0 - 37 U/L   ALT 36 0 - 53 U/L   Total Protein 7.4 6.0 - 8.3 g/dL   Albumin 4.3 3.5 - 5.2 g/dL  Lipid panel  Result Value Ref Range   Cholesterol 170 0 - 200 mg/dL   Triglycerides 85.0 0 - 149 mg/dL   HDL 33.60 (L) >39.00 mg/dL   VLDL 17.0 0.0 - 40.0 mg/dL   LDL Cholesterol 120 (H) 0 - 99 mg/dL   Total CHOL/HDL Ratio 5    NonHDL 136.72     Assessment and Plan:     ICD-10-CM   1. 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  I applauded him for quitting drinking entirely.  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 Brought up to date unless services declined.  Counselled on the importance of diet, exercise, and its role in overall health and mortality. The patient's FH and SH was reviewed, including their home life, tobacco status, and drug and alcohol status.  Follow-up in 1 year for physical exam or additional follow-up below.  Follow-up: No follow-ups on file. Or follow-up in 1 year if not noted.  No orders of the defined types were placed in this encounter.  Medications Discontinued During This Encounter  Medication Reason  . Ascorbic Acid (VITAMIN C) 100 MG tablet Completed Course  . fexofenadine-pseudoephedrine (ALLEGRA-D) 60-120 MG 12 hr tablet Completed Course  . guaiFENesin (MUCINEX) 600 MG 12 hr tablet Completed Course  . predniSONE (DELTASONE) 20 MG tablet Completed Course   No orders of the defined types were placed in this encounter.   Signed,  Maud Deed. Raford Brissett, MD   Allergies as of 02/27/2020   No Known Allergies     Medication List       Accurate as of February 27, 2020  2:10 PM. If you have any questions, ask your nurse or doctor.        STOP taking these medications   fexofenadine-pseudoephedrine 60-120 MG 12 hr tablet Commonly known as: ALLEGRA-D Stopped by: Owens Loffler, MD   guaiFENesin 600 MG 12 hr tablet Commonly known as: MUCINEX Stopped by: Owens Loffler, MD   predniSONE 20 MG tablet Commonly known as: DELTASONE Stopped by: Owens Loffler, MD   vitamin C 100 MG tablet Stopped by: Owens Loffler, MD     TAKE these medications   albuterol (2.5 MG/3ML) 0.083% nebulizer solution Commonly known as: PROVENTIL Take 3 mLs (2.5 mg total) by nebulization every 6 (six) hours as needed for wheezing or shortness of breath.   albuterol 108 (90 Base) MCG/ACT inhaler Commonly known  as: VENTOLIN HFA INHALE 2 PUFFS BY MOUTH 4 TIMES A DAY   amLODipine 5 MG tablet Commonly known as: NORVASC TAKE 1 TABLET BY MOUTH EVERY DAY   cetirizine 10 MG tablet Commonly known as: ZYRTEC Take 10 mg by mouth every morning.   Flovent HFA 110 MCG/ACT inhaler Generic drug: fluticasone INHALE 1 PUFF BY MOUTH TWICE A DAY   fluticasone 50 MCG/ACT nasal spray Commonly known as: FLONASE SPRAY 2 SPRAYS INTO EACH NOSTRIL EVERY DAY   omeprazole 20 MG capsule Commonly known as: PRILOSEC Take 1 capsule (20 mg total) by mouth daily.

## 2020-03-06 ENCOUNTER — Encounter: Payer: Self-pay | Admitting: Family Medicine

## 2020-04-05 ENCOUNTER — Other Ambulatory Visit: Payer: Self-pay | Admitting: Family Medicine

## 2020-05-11 NOTE — Progress Notes (Signed)
Clarence Pandolfi T. Ethan Kasperski, MD, [place] at [facility] [place], [address]  Phone: [phone]  FAX: [phone]  Clarence Alvarez - 54 y.o. male  MRN [id number]  Date of Birth: 03-12-1966  Date: 05/14/2020  PCP: Owens Loffler, MD  Referral: Owens Loffler, MD  Chief Complaint  Patient presents with  . Just hasn't been feeling yoursefl lately    This visit occurred during the SARS-CoV-2 public health emergency.  Safety protocols were in place, including screening questions prior to the visit, additional usage of staff PPE, and extensive cleaning of exam room while observing appropriate contact time as indicated for disinfecting solutions.   Subjective:   Clarence Alvarez is a 54 y.o. very pleasant male patient with Body mass index is 34.11 kg/m. who presents with the following:  He presents today, and he wants to talk about some recent ED.  Having some issues with some ed.  Has some neck and back pain off and on.  Energy is down.  Drinking more caffeine.   Wants to transition out of school.  Trying to move to the city of [place].  Daughters with some different dynamics.  Grandchildren.  Sleeping about 5-6 hours.    Sleep apnea?  Not working out as much.  Review of Systems is noted in the HPI, as appropriate  Objective:   BP 140/80   Pulse 70   Temp (!) 96.9 F (36.1 C) (Temporal)   Ht 5' 11.5" (1.816 m)   Wt 248 lb (112.5 kg)   SpO2 96%   BMI 34.11 kg/m   GEN: No acute distress; alert,appropriate. PULM: Breathing comfortably in no respiratory distress PSYCH: Normally interactive.   Laboratory and Imaging Data:  Assessment and Plan:     ICD-10-CM   1. Erectile dysfunction due to diseases classified elsewhere  N52.1 Testos,Total,Free and SHBG (Male)    TSH  2. Other fatigue  R53.83 Testos,Total,Free and SHBG (Male)    TSH   Total encounter time: 30  minutes. This includes total time spent on the day of encounter.  We had a long conversation about erectile dysfunction in general including psychogenic versus arterial in a setting of hypertension and diabetes.  He is having quite a bit of stress in his home life and multiple fronts.  He does not think he has been getting morning erections all that well compared to prior years, and he has less drive as well as losing some erections while he is having intercourse, and this has not been an issue before.  He is very stressed with his work situation as well.  Check testosterone and TSH.  If his testosterone is low, and he would possibly consider going on supplementation.  He does understand the risk including increased risk of prostate cancer.  No orders of the defined types were placed in this encounter.  There are no discontinued medications. Orders Placed This Encounter  Procedures  . Testos,Total,Free and SHBG (Male)  . TSH    Follow-up: No follow-ups on file.  Signed,  Maud Deed. Vickey Ewbank, MD   Outpatient Encounter Medications as of 05/14/2020  Medication Sig  . albuterol (PROVENTIL) (2.5 MG/3ML) 0.083% nebulizer solution Take 3 mLs (2.5 mg total) by nebulization every 6 (six) hours as needed for wheezing or shortness of breath.  Marland Kitchen albuterol (VENTOLIN HFA) 108 (90 Base) MCG/ACT inhaler INHALE 2 PUFFS BY MOUTH 4 TIMES A DAY  . amLODipine (  NORVASC) 5 MG tablet TAKE 1 TABLET BY MOUTH EVERY DAY  . cetirizine (ZYRTEC) 10 MG tablet Take 10 mg by mouth every morning.   Marland Kitchen FLOVENT HFA 110 MCG/ACT inhaler INHALE 1 PUFF BY MOUTH TWICE A DAY  . fluticasone (FLONASE) 50 MCG/ACT nasal spray SPRAY 2 SPRAYS INTO EACH NOSTRIL EVERY DAY  . omeprazole (PRILOSEC) 20 MG capsule TAKE 1 CAPSULE BY MOUTH EVERY DAY  . [DISCONTINUED] albuterol (PROVENTIL,VENTOLIN) 90 MCG/ACT inhaler Inhale 2 puffs into the lungs every 6 (six) hours as needed for wheezing.   No facility-administered encounter medications  on file as of 05/14/2020.

## 2020-05-14 ENCOUNTER — Other Ambulatory Visit: Payer: Self-pay

## 2020-05-14 ENCOUNTER — Encounter: Payer: Self-pay | Admitting: Family Medicine

## 2020-05-14 ENCOUNTER — Ambulatory Visit (INDEPENDENT_AMBULATORY_CARE_PROVIDER_SITE_OTHER): Payer: Commercial Managed Care - PPO | Admitting: Family Medicine

## 2020-05-14 VITALS — BP 140/80 | HR 70 | Temp 96.9°F | Ht 71.5 in | Wt 248.0 lb

## 2020-05-14 DIAGNOSIS — N521 Erectile dysfunction due to diseases classified elsewhere: Secondary | ICD-10-CM

## 2020-05-14 DIAGNOSIS — R5383 Other fatigue: Secondary | ICD-10-CM

## 2020-05-14 LAB — TSH: TSH: 3.42 u[IU]/mL (ref 0.35–4.50)

## 2020-05-17 LAB — TESTOS,TOTAL,FREE AND SHBG (FEMALE)
Free Testosterone: 63.4 pg/mL (ref 35.0–155.0)
Sex Hormone Binding: 75 nmol/L — ABNORMAL HIGH (ref 10–50)
Testosterone, Total, LC-MS-MS: 712 ng/dL (ref 250–1100)

## 2020-05-18 MED ORDER — SILDENAFIL CITRATE 100 MG PO TABS: 50.0000 mg | ORAL_TABLET | Freq: Every day | ORAL | 11 refills | Status: DC | PRN

## 2020-05-18 NOTE — Addendum Note (Signed)
Addended by: Owens Loffler on: 05/18/2020 08:17 AM   Modules accepted: Orders

## 2020-05-20 ENCOUNTER — Other Ambulatory Visit: Payer: Self-pay | Admitting: Family Medicine

## 2020-05-22 ENCOUNTER — Telehealth: Payer: Commercial Managed Care - PPO | Admitting: Physician Assistant

## 2020-05-22 DIAGNOSIS — R300 Dysuria: Secondary | ICD-10-CM

## 2020-05-22 NOTE — Progress Notes (Signed)
Based on what you shared with me, I feel your condition warrants further evaluation and I recommend that you be seen for a face to face office visit.  Male bladder infections are not very common.  We worry about prostate or kidney conditions.  The standard of care is to examine the abdomen and kidneys, and to do a urine and blood test to make sure that something more serious is not going on.  We recommend that you see a provider today.  If your doctor's office is closed [facility] has the following Urgent Cares:    NOTE: If you entered your credit card information for this eVisit, you will not be charged. You may see a "hold" on your card for the $35 but that hold will drop off and you will not have a charge processed.   If you are having a true medical emergency please call 911.     For an urgent face to face visit, [facility] has four urgent care centers for your convenience:    NEW:  [facility] Urgent [place], [place] [address] .  Monday - Friday 10 am - 6 pm    . [facility]    [phone]                  Get Driving Directions  [address] [place], [place] [address] . 10 am to 8 pm Monday-Friday . 12 pm to 8 pm Saturday-Sunday   . [facility]gent [facility] at [place]                  Get Driving Directions  [address], [place], [place] [address] . 8 am to 8 pm Monday-Friday . 9 am to 6 pm Saturday . 11 am to 6 pm Sunday   . [facility] Urgent Care at [place]                  Get Driving Directions   [address] [place], [place] [address] . 8 am to 8 pm Monday-Friday . 8 am to 4 pm Saturday-Sunday    . [facility] Urgent Care at [place]                    Get Driving Directions  [phone]  [address], [place], [place] [address]  . Monday-Friday, 12 PM to 6 PM    Your e-visit  answers were reviewed by a board certified advanced clinical practitioner to complete your personal care plan.  Thank you for using e-Visits.

## 2020-05-23 ENCOUNTER — Encounter: Payer: Self-pay | Admitting: Family Medicine

## 2020-05-23 ENCOUNTER — Telehealth: Payer: Self-pay | Admitting: *Deleted

## 2020-05-23 ENCOUNTER — Ambulatory Visit (INDEPENDENT_AMBULATORY_CARE_PROVIDER_SITE_OTHER): Payer: Commercial Managed Care - PPO | Admitting: Family Medicine

## 2020-05-23 DIAGNOSIS — R109 Unspecified abdominal pain: Secondary | ICD-10-CM

## 2020-05-23 DIAGNOSIS — M79605 Pain in left leg: Secondary | ICD-10-CM

## 2020-05-23 DIAGNOSIS — J302 Other seasonal allergic rhinitis: Secondary | ICD-10-CM | POA: Diagnosis not present

## 2020-05-23 MED ORDER — PREDNISONE 20 MG PO TABS: ORAL_TABLET | ORAL | 0 refills | Status: DC

## 2020-05-23 NOTE — Progress Notes (Signed)
Virtual Visit via Video Note  I connected with Clarence Alvarez on 05/23/20 at  4:00 PM EST by a video enabled telemedicine application and verified that I am speaking with the correct person using two identifiers.  Location: Patient: At his job Provider: [place] Persons participating in virtual visit: Patient, provider   I discussed the limitations of evaluation and management by telemedicine and the availability of in person appointments. The patient expressed understanding and agreed to proceed.  History of Present Illness: Chief Complaint  Patient presents with   Headache    detected water leak in house on Monday 05/19/2020 blackmold.  Pt states after cleaning up the mold he developed symptoms of headache, sorethroat, yellow mucus, and sinus drainage.   Groin Pain    x3 wks radiates to back area  and lower left leg   This is a 54 yo male who presents today for virtual visit for above chief complaint. He  has a past medical history of Allergic rhinitis, seasonal, Arthritis, GERD (gastroesophageal reflux disease), History of adenomatous polyp of colon, Hypertension, Lactose intolerance, Left ureteral stone, Mild obstructive sleep apnea, Moderate persistent asthma, MRSA infection (2010), Sleep apnea, and Traumatic complete tear of right rotator cuff (11/30/2018).  Headache/nasal congestion/watery eyes-patient has had symptoms for about 5 days and noticed them after he did some work behind his dishwasher where he found yellow and black growth. He reports postnasal drip, pain behind his eyes, occasional chest tightness. He is fully vaccinated against COVID-19. He had a negative rapid test at home several days ago. He has a PCR test pending from today. He has a history of asthma and seasonal allergies. He denies fever, shortness of breath. He resumed his fluticasone nasal spray this week. He has not been using his albuterol inhaler. He is typically maintained on Flovent 110 mcg twice  daily. He has also resumed his Allegra this week. He has not taken anything additionally for headache. It is about the same. No visual changes. He has been able to work without difficulty. He reports that he gets this every fall and it is cleared up with Z-Pak and prednisone.  Patient reports about a 1 month history of various pains that started in his left groin and then seemed to move to the right side. He is noticed some right low back flank pain that is nagging and comes and goes. He has some left pain from his buttock to the back of his leg and calf. He denies any swelling or redness of the area. This pain is aching and comes and goes. He has not taken anything for this pain. He reports that he has a very physical job with a lot of heavy lifting but it is not out of the ordinary for him. No nausea, vomiting, bowel or bladder disturbance.   Observations/Objective: Patient is alert and answers questions appropriately. He is wearing a mask during our video visit. I am easily able to understand him and he is normally conversive without increased work of breathing. He had one episode of dry cough during our interview. He is ambulating without difficulty. His mood and affect are appropriate. There were no vitals taken for this visit. Wt Readings from Last 3 Encounters:  05/14/20 248 lb (112.5 kg)  02/27/20 249 lb 8 oz (113.2 kg)  09/19/19 240 lb (108.9 kg)   BP Readings from Last 3 Encounters:  05/14/20 140/80  02/27/20 129/84  09/19/19 (!) 181/82    Assessment and Plan: 1. Seasonal  allergic rhinitis, unspecified trigger -I do not think he would benefit from antibiotics at this time and we discussed this. I am okay to give him a short course of prednisone. He was advised to continue his fluticasone nasal spray, Flovent inhaler, albuterol as needed, Allegra and can take Tylenol as needed pain -Follow-up precautions reviewed - predniSONE (DELTASONE) 20 MG tablet; Take 2 tablets daily x 3 days then  1 tablet daily x 4 days  Dispense: 10 tablet; Refill: 0 -He is awaiting COVID-19 PCR test that was collected today  2. Right flank pain -He has a history of kidney stones and this cannot be ruled out in a video visit. He has been symptomatic for several weeks so I think he can wait until Monday for an in person visit as long as he does not have worsening symptoms or problems with urination  3. Left leg pain -Seems to be originating in his back, as per #2, cannot rule out kidney stone or musculoskeletal origin. I do not have a high suspicion for a DVT as this was been going on for 3 to 4 weeks now. He was advised to have an appointment in the office at the beginning of next week for in person evaluation   Clarence Reamer, FNP-BC  [facility], [place]  05/23/2020 4:32 PM   Follow Up Instructions:    I discussed the assessment and treatment plan with the patient. The patient was provided an opportunity to ask questions and all were answered. The patient agreed with the plan and demonstrated an understanding of the instructions.   The patient was advised to call back or seek an in-person evaluation if the symptoms worsen or if the condition fails to improve as anticipated.    Clarence Beck, FNP

## 2020-05-23 NOTE — Telephone Encounter (Signed)
Noted  

## 2020-05-23 NOTE — Telephone Encounter (Signed)
Patient called stating that he has several issues going on. Patient stated that he has [employer] and did a telemedicine call with them. Patient stated that he was advised that he should be seen by his doctor.  Patient stated that he has a sore throat, headache, yellow mucus and sinus drainage. Patient stated that he is fully vaccinated and does not think that he has covid. 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 Patient stated that he also is having pain in his lower groin area that goes down into his left leg and back. Patient stated that he was told that he could have a UTI but patient does not think that is the case. 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 Patient stated that these symptoms have been going on for 2-3 weeks. Patient scheduled for a virtual visit today at 4:00 pm with Tor Netters NP. Patient was advised that he should get a PCR test to confirm that he does not have covid and he agreed. Patient was given ER precautions and verbalized understanding.

## 2020-05-30 ENCOUNTER — Telehealth: Payer: Self-pay

## 2020-05-30 NOTE — Telephone Encounter (Signed)
Pt left v/m; had video visit with Glenda Chroman FNP 05/23/20.pt is feeling better but not completely cleared; pt still has some chest congestion; the prednisone helped the nasal congestion and h/a but still has not completely cleared the chest congestion; pt has used the albuterol and mucinex but pt still thinks needs extension on prednisone taper to completely get rid of chest congestion. Pt request cb after reviewed. [facility] [place]; Glenda Chroman out of office and sending note to Gentry Fitz NP.

## 2020-05-30 NOTE — Telephone Encounter (Signed)
If he's getting better then I don't recommend any additional prednisone, symptoms should continue to improve. Continue Flovent, Flonase, Allegra or Zyrtec. Stop Mucinex if ineffective. I assume his PCR Covid test was negative?  Needs to make sure he's hydrating well with water to thin mucous.   If he has fevers, SOB, increased chest congestion then I recommend chest xray.

## 2020-05-30 NOTE — Telephone Encounter (Signed)
Mr. Elmes notified as instructed by telephone.  Patient states understanding.  

## 2020-06-11 ENCOUNTER — Ambulatory Visit (INDEPENDENT_AMBULATORY_CARE_PROVIDER_SITE_OTHER)
Admission: RE | Admit: 2020-06-11 | Discharge: 2020-06-11 | Disposition: A | Discharge status: Home / Self Care | Payer: Commercial Managed Care - PPO | Source: Ambulatory Visit | Attending: Family Medicine | Admitting: Family Medicine

## 2020-06-11 ENCOUNTER — Other Ambulatory Visit: Payer: Self-pay

## 2020-06-11 ENCOUNTER — Ambulatory Visit (INDEPENDENT_AMBULATORY_CARE_PROVIDER_SITE_OTHER): Payer: Commercial Managed Care - PPO | Admitting: Family Medicine

## 2020-06-11 ENCOUNTER — Encounter: Payer: Self-pay | Admitting: Family Medicine

## 2020-06-11 VITALS — BP 124/80 | HR 72 | Temp 98.3°F | Ht 71.5 in | Wt 246.5 lb

## 2020-06-11 DIAGNOSIS — R110 Nausea: Secondary | ICD-10-CM

## 2020-06-11 DIAGNOSIS — M67952 Unspecified disorder of synovium and tendon, left thigh: Secondary | ICD-10-CM | POA: Diagnosis not present

## 2020-06-11 DIAGNOSIS — M7062 Trochanteric bursitis, left hip: Secondary | ICD-10-CM | POA: Diagnosis not present

## 2020-06-11 DIAGNOSIS — J3489 Other specified disorders of nose and nasal sinuses: Secondary | ICD-10-CM

## 2020-06-11 DIAGNOSIS — W57XXXA Bitten or stung by nonvenomous insect and other nonvenomous arthropods, initial encounter: Secondary | ICD-10-CM

## 2020-06-11 DIAGNOSIS — M25552 Pain in left hip: Secondary | ICD-10-CM | POA: Diagnosis not present

## 2020-06-11 DIAGNOSIS — M1612 Unilateral primary osteoarthritis, left hip: Secondary | ICD-10-CM | POA: Diagnosis not present

## 2020-06-11 DIAGNOSIS — S30863A Insect bite (nonvenomous) of scrotum and testes, initial encounter: Secondary | ICD-10-CM

## 2020-06-11 MED ORDER — PREDNISONE 20 MG PO TABS: ORAL_TABLET | ORAL | 0 refills | Status: DC

## 2020-06-11 MED ORDER — DOXYCYCLINE HYCLATE 100 MG PO TABS: 100.0000 mg | ORAL_TABLET | Freq: Two times a day (BID) | ORAL | 0 refills | Status: AC

## 2020-06-11 NOTE — Progress Notes (Signed)
Brandell Maready T. Sigmund Morera, MD, [place] at [facility] [place], [address]  Phone: [phone]  FAX: [phone]  Clarence Alvarez - 54 y.o. male  MRN [id number]  Date of Birth: 1965/10/26  Date: 06/11/2020  PCP: Owens Loffler, MD  Referral: Owens Loffler, MD  Chief Complaint  Patient presents with  . Hip Pain    Started in buttocks and now has moved to Left hip  . Insect Bite    Tick on testicle    This visit occurred during the SARS-CoV-2 public health emergency.  Safety protocols were in place, including screening questions prior to the visit, additional usage of staff PPE, and extensive cleaning of exam room while observing appropriate contact time as indicated for disinfecting solutions.   Subjective:   Clarence Alvarez is a 54 y.o. very pleasant male patient with Body mass index is 33.9 kg/m. who presents with the following:  Radiating low back pain to the L buttocks: ? In his hip. Very intense.  Took some alleve and it felt better.   Has not done all that much.  Has moved some furniture.  Leg will get weak some -this will be on the left side He has pain in the posterior buttocks as well as in the anterior groin.  He does have pain with rotational movements.  He denies radicular symptoms, numbness, or weakening down the entirety of the leg on the left.  No bowel or bladder incontinence.  Tick bite on testicle: Felt some blood on testible.   Does not feel well.  Headaches, nauseated.   He also reports some runny nose and nasopharynx congestion  Immunization History  Administered Date(s) Administered  . Influenza Whole 04/11/2012  . Influenza-Unspecified 03/12/2016, 04/10/2017, 03/12/2018, 04/06/2019, 05/02/2020  . PFIZER SARS-COV-2 Vaccination 09/08/2019, 09/29/2019  . Pneumococcal Polysaccharide-23 10/02/2018  . Td 05/12/2004  . Tdap 12/12/2017     Review of  Systems is noted in the HPI, as appropriate   Objective:   BP 124/80   Pulse 72   Temp 98.3 F (36.8 C) (Temporal)   Ht 5' 11.5" (1.816 m)   Wt 246 lb 8 oz (111.8 kg)   SpO2 96%   BMI 33.90 kg/m   GEN: No acute distress; alert,appropriate. PULM: Breathing comfortably in no respiratory distress PSYCH: Normally interactive.     HIP EXAM: SIDE: Left ROM: Abduction, Flexion, Internal and External range of motion: Approximate 20% loss of range of motion/20 degrees loss of motion in the left hip with the hip flexed to 90 degrees.  Abduction is grossly preserved Pain with terminal IROM and EROM: Pain with terminal internal range of motion as well as external range of motion. GTB: Mildly tender to palpation He is also tender to palpation on the left upper pelvic rim. SLR: NEG Knees: No effusion Piriformis: NT at direct palpation Str: flexion: 5/5 abduction: 5/5 adduction: 5/5 Strength testing non-tender  Range of motion at  the waist: Flexion: normal Extension: normal Lateral bending: normal Rotation: all normal  No echymosis or edema Rises to examination table with no difficulty Gait: non antalgic  Inspection/Deformity: N Paraspinus Tenderness: Modest L3-S1  B Ankle Dorsiflexion (L5,4): 5/5 B Great Toe Dorsiflexion (L5,4): 5/5 Rise/Squat (L4): WNL  SENSORY B Medial Foot (L4): WNL B Dorsum (L5): WNL B Lateral (S1): WNL Light Touch: WNL Pinprick: WNL  REFLEXES Knee (L4): 2+ Ankle (S1): 2+  B SLR, seated: neg  B SLR, supine: neg B FABER: Groin pain, left B Reverse FABER: neg B Greater Troch: Mild tenderness left  B Log Roll: neg B Sciatic Notch: NT   Throat cervical lymph nodes are all normal.  Cardiovascular and pulmonary exam are normal.  Radiology: DG HIP UNILAT WITH PELVIS 2-3 VIEWS LEFT  Result Date: 06/11/2020 CLINICAL DATA:  Evaluate for osteoarthritis, left hip and deep groin pain EXAM: DG HIP (WITH OR WITHOUT PELVIS) 2-3V LEFT COMPARISON:  None.  FINDINGS: There is no evidence of hip fracture or dislocation. Mild degenerative spurring of the left hip. Pelvic phleboliths. IMPRESSION: Mild degenerative change left hip.  No acute osseous abnormality. Electronically Signed   By: Dahlia Bailiff MD   On: 06/11/2020 16:48    Assessment and Plan:     ICD-10-CM   1. Acute hip pain, left  M25.552 DG HIP UNILAT WITH PELVIS 2-3 VIEWS LEFT  2. Arthritis of left hip  M16.12   3. Trochanteric bursitis of left hip  M70.62   4. Tendinopathy of left gluteus medius  M67.952   5. Tick bite of scrotum, initial encounter  T03.546F    W57.XXXA   6. Nausea  R11.0   7. Rhinorrhea  J34.89    In a setting of systemic symptoms, aching, feeling unwell, fatigue, headache, rhinorrhea status post tick bite, tick borne illness would be the highest in the differential.  Given the COVID-19 pandemic, it is entirely appropriate to obtain his COVID-19 test prior to returning to work.  Exam consistent with arthritis of the left hip with exacerbation.  He also does have some insertional tendinopathy of the left gluteus medius as well as some trochanteric bursitis.  Continue to try to work on flexibility and fitness, and I am going to burst the patient with some steroids today to see if this calm down to the symptoms.  Otherwise naproxen or ibuprofen before work would be reasonable.  Meds ordered this encounter  Medications  . doxycycline (VIBRA-TABS) 100 MG tablet    Sig: Take 1 tablet (100 mg total) by mouth 2 (two) times daily for 14 days.    Dispense:  28 tablet    Refill:  0  . predniSONE (DELTASONE) 20 MG tablet    Sig: 2 tabs po for 4 days, then 1 tab po for 4 days    Dispense:  12 tablet    Refill:  0   Medications Discontinued During This Encounter  Medication Reason  . predniSONE (DELTASONE) 20 MG tablet Completed Course   Orders Placed This Encounter  Procedures  . DG HIP UNILAT WITH PELVIS 2-3 VIEWS LEFT    Follow-up: No follow-ups on  file.  Signed,  Maud Deed. Jaymond Waage, MD   Outpatient Encounter Medications as of 06/11/2020  Medication Sig  . albuterol (PROVENTIL) (2.5 MG/3ML) 0.083% nebulizer solution Take 3 mLs (2.5 mg total) by nebulization every 6 (six) hours as needed for wheezing or shortness of breath.  Marland Kitchen albuterol (VENTOLIN HFA) 108 (90 Base) MCG/ACT inhaler INHALE 2 PUFFS BY MOUTH 4 TIMES A DAY  . amLODipine (NORVASC) 5 MG tablet TAKE 1 TABLET BY MOUTH EVERY DAY  . Ascorbic Acid (VITAMIN C PO) Take by mouth daily.  . cetirizine (ZYRTEC) 10 MG tablet Take 10 mg by mouth every morning.   Marland Kitchen FLOVENT HFA 110 MCG/ACT inhaler INHALE 1 PUFF BY MOUTH TWICE A DAY  . fluticasone (FLONASE) 50 MCG/ACT nasal spray SPRAY 2 SPRAYS INTO EACH NOSTRIL EVERY DAY  . omeprazole (PRILOSEC) 20  MG capsule TAKE 1 CAPSULE BY MOUTH EVERY DAY  . sildenafil (VIAGRA) 100 MG tablet Take 0.5-1 tablets (50-100 mg total) by mouth daily as needed for erectile dysfunction.  Marland Kitchen doxycycline (VIBRA-TABS) 100 MG tablet Take 1 tablet (100 mg total) by mouth 2 (two) times daily for 14 days.  . predniSONE (DELTASONE) 20 MG tablet 2 tabs po for 4 days, then 1 tab po for 4 days  . [DISCONTINUED] albuterol (PROVENTIL,VENTOLIN) 90 MCG/ACT inhaler Inhale 2 puffs into the lungs every 6 (six) hours as needed for wheezing.  . [DISCONTINUED] predniSONE (DELTASONE) 20 MG tablet Take 2 tablets daily x 3 days then 1 tablet daily x 4 days   No facility-administered encounter medications on file as of 06/11/2020.

## 2020-06-12 ENCOUNTER — Encounter: Payer: Self-pay | Admitting: Family Medicine

## 2020-07-12 DIAGNOSIS — S36039A Unspecified laceration of spleen, initial encounter: Secondary | ICD-10-CM

## 2020-07-12 HISTORY — DX: Unspecified laceration of spleen, initial encounter: S36.039A

## 2020-07-17 ENCOUNTER — Telehealth: Payer: Self-pay

## 2020-07-17 NOTE — Telephone Encounter (Signed)
Received call from patient states that he tested positive on 07/18/2019 for covid. Symptoms started 07/15/2019. Has had vaccination and booster.  He does have history of asthma. Describes symptoms as mild at this time. He is having some tightness in chest and wheezing that improves with rescue inhaler. He is not taking more than 3 times a day. He does have albuterol nebulizer but has not felt need to take at this time. Only other symptom is headache. I have reviewed all red words with patient as well. If any he will go to Ed. He does not have way to monitor or check his oxygen at home. I have scheduled for a virtual with Dr. Milinda Antis 07/18/2020 at 11:45

## 2020-07-17 NOTE — Telephone Encounter (Signed)
Will see him then 

## 2020-07-18 ENCOUNTER — Other Ambulatory Visit: Payer: Self-pay

## 2020-07-18 ENCOUNTER — Telehealth (INDEPENDENT_AMBULATORY_CARE_PROVIDER_SITE_OTHER): Payer: Commercial Managed Care - PPO | Admitting: Family Medicine

## 2020-07-18 ENCOUNTER — Encounter: Payer: Self-pay | Admitting: Family Medicine

## 2020-07-18 DIAGNOSIS — U071 COVID-19: Secondary | ICD-10-CM | POA: Insufficient documentation

## 2020-07-18 MED ORDER — PREDNISONE 10 MG PO TABS: ORAL_TABLET | ORAL | 0 refills | Status: DC

## 2020-07-18 NOTE — Assessment & Plan Note (Signed)
In pt fully immunized with booster Mild reactive airway symptoms (h/o asthma)  Px prednisone taper (disc side eff) inst to treat symptoms (may try mucinex or mucinex dm) Continue current allergy medicines Update if worse or new symptoms ER parameters discussed

## 2020-07-18 NOTE — Progress Notes (Unsigned)
Virtual Visit via Video Note  I connected with Clarence Alvarez on 07/18/20 at 12:00 PM EST by a video enabled telemedicine application and verified that I am speaking with the correct person using two identifiers.  Location: Patient: home Provider: office   I discussed the limitations of evaluation and management by telemedicine and the availability of in person appointments. The patient expressed understanding and agreed to proceed.  Parties involved in encounter  Patient: Clarence Alvarez  Provider:  Loura Pardon MD   History of Present Illness: 55 yo pf of Dr [name] presents with covid 59  He has a history of asthma   Tested positive on 07/16/20   (wife also tested pos)  Nasal congestion and runny nose Yellow mucous   Prod cough-yellow mucous Wheezing - has to use albuterol 2-4 times per day  Some chest tightness not going away  With exertion a little short of breath (walking up stairs)   Has not used neb machine   Headache -takes advil  Had some pressure behind eyes -improved now   No ST or ear pain    No fever or chills/aches Did not loose taste or smell  Had some nausea -mild  (limiting food intake) -not bad    otc advil Zyrtec  Lots of fluids  Has not taken mucinex yet    he is covid immunized with booster (pfizer)   On med list  Albuterol mdi and neb solun  flovent hfa 110  flonase ns   Patient Active Problem List   Diagnosis Date Noted  . COVID-19 07/18/2020  . Diabetes mellitus type 2, diet-controlled ([place]) 05/16/2019  . Traumatic complete tear of right rotator cuff 11/30/2018  . Essential hypertension 03/20/2018  . Degeneration of intervertebral disc of cervical spine without disc herniation 02/29/2016  . OSA (obstructive sleep apnea) 01/26/2013  . ALLERGIC RHINITIS, SEASONAL 09/15/2009  . Asthma 09/08/2006   Past Medical History:  Diagnosis Date  . Allergic rhinitis, seasonal   . Arthritis    right knee  . GERD (gastroesophageal reflux  disease)   . History of adenomatous polyp of colon    03-25-2014  tubular adenoma's /  hyperplastic  . Hypertension   . Lactose intolerance   . Left ureteral stone    2018  . Mild obstructive sleep apnea    per study 2014, pt states he does not have sleep apnea  . Moderate persistent asthma    followed by pcp-- uncomplicated  . MRSA infection 2010   buttocks  . Sleep apnea    no CPAP used  . Traumatic complete tear of right rotator cuff 11/30/2018   Past Surgical History:  Procedure Laterality Date  . [place] STUDY N/A 05/04/2018   Procedure: [place] STUDY;  Surgeon: Jonathon Bellows, MD;  Location: [facility] ENDOSCOPY;  Service: Gastroenterology;  Laterality: N/A;  . anterior cervucal  2017  . COLONOSCOPY  03/25/2014  . ESOPHAGEAL MANOMETRY N/A 05/04/2018   Procedure: ESOPHAGEAL MANOMETRY (EM);  Surgeon: Jonathon Bellows, MD;  Location: [facility] ENDOSCOPY;  Service: Gastroenterology;  Laterality: N/A;  . ESOPHAGOGASTRODUODENOSCOPY (EGD) WITH PROPOFOL N/A 02/24/2018   Procedure: ESOPHAGOGASTRODUODENOSCOPY (EGD) WITH PROPOFOL;  Surgeon: Jonathon Bellows, MD;  Location: [facility] ENDOSCOPY;  Service: Gastroenterology;  Laterality: N/A;  . KNEE SURGERY Right 2016  . RESECTION DISTAL CLAVICAL Right 12/06/2018   Procedure: RESECTION DISTAL CLAVICAL AND DEBRIDEMENT OF THE SHOULDER;  Surgeon: Elsie Saas, MD;  Location: [place];  Service: Orthopedics;  Laterality: Right;  SCALENE BLOCK  . SHOULDER ARTHROSCOPY WITH ROTATOR CUFF REPAIR AND SUBACROMIAL DECOMPRESSION Left 07-24-2010  dr Noemi Chapel @  [facility]   and Labrum tear debridement  . SHOULDER ARTHROSCOPY WITH ROTATOR CUFF REPAIR AND SUBACROMIAL DECOMPRESSION Right 12/06/2018   Procedure: SHOULDER ARTHROSCOPY WITH ROTATOR CUFF REPAIR AND SUBACROMIAL DECOMPRESSION;  Surgeon: Elsie Saas, MD;  Location: [place];  Service: Orthopedics;  Laterality: Right;  SCALENE BLOCK  . UPPER GASTROINTESTINAL ENDOSCOPY     Social History    Tobacco Use  . Smoking status: Former Smoker    Years: 3.00    Types: Cigarettes    Quit date: 07/12/1984    Years since quitting: 36.0  . Smokeless tobacco: Former Systems developer    Types: Snuff    Quit date: 2017  . Tobacco comment: "EXPERIMENTED" W/SMOKING--NOT EVEN A PACK X 1 WEEK.   Vaping Use  . Vaping Use: Never used  Substance Use Topics  . Alcohol use: Yes    Alcohol/week: 3.0 standard drinks    Types: 3 Shots of liquor per week    Comment: 1-2 drinks per week  . Drug use: No   Family History  Problem Relation Age of Onset  . Diabetes Maternal Grandmother   . Colon cancer Maternal Grandmother        dx age 31 - recurance at age 37  . Diabetes Mother   . Hepatitis C Mother   . Prostate cancer Father   . Colon cancer Father        dx - 72's  . Diabetes Paternal Grandmother   . Mental illness Brother   . Mental illness Sister   . Mental illness Daughter   . Stomach cancer Maternal Grandfather   . COPD Other        Aunt  . Diabetes Other        siblings  . Pancreatic cancer Paternal Aunt   . Esophageal cancer Neg Hx   . Rectal cancer Neg Hx    No Known Allergies Current Outpatient Medications on File Prior to Visit  Medication Sig Dispense Refill  . albuterol (PROVENTIL) (2.5 MG/3ML) 0.083% nebulizer solution Take 3 mLs (2.5 mg total) by nebulization every 6 (six) hours as needed for wheezing or shortness of breath. 150 mL 1  . albuterol (VENTOLIN HFA) 108 (90 Base) MCG/ACT inhaler INHALE 2 PUFFS BY MOUTH 4 TIMES A DAY 8.5 g 5  . amLODipine (NORVASC) 5 MG tablet TAKE 1 TABLET BY MOUTH EVERY DAY 30 tablet 5  . Ascorbic Acid (VITAMIN C PO) Take by mouth daily.    . cetirizine (ZYRTEC) 10 MG tablet Take 10 mg by mouth every morning.     Marland Kitchen FLOVENT HFA 110 MCG/ACT inhaler INHALE 1 PUFF BY MOUTH TWICE A DAY 12 g 5  . fluticasone (FLONASE) 50 MCG/ACT nasal spray SPRAY 2 SPRAYS INTO EACH NOSTRIL EVERY DAY 16 mL 11  . omeprazole (PRILOSEC) 20 MG capsule TAKE 1 CAPSULE BY MOUTH  EVERY DAY 30 capsule 11  . sildenafil (VIAGRA) 100 MG tablet Take 0.5-1 tablets (50-100 mg total) by mouth daily as needed for erectile dysfunction. 5 tablet 11  . [DISCONTINUED] albuterol (PROVENTIL,VENTOLIN) 90 MCG/ACT inhaler Inhale 2 puffs into the lungs every 6 (six) hours as needed for wheezing. 17 g 2   No current facility-administered medications on file prior to visit.   Review of Systems  Constitutional: Negative for chills, fever and malaise/fatigue.  HENT: Positive for congestion. Negative for ear pain, sinus pain and sore  throat.   Eyes: Negative for blurred vision, discharge and redness.  Respiratory: Positive for cough, sputum production and wheezing. Negative for shortness of breath and stridor.   Cardiovascular: Negative for chest pain, palpitations and leg swelling.  Gastrointestinal: Negative for abdominal pain, diarrhea, nausea and vomiting.  Musculoskeletal: Negative for myalgias.  Skin: Negative for rash.  Neurological: Negative for dizziness and headaches.    Observations/Objective: Patient appears well, in no distress Weight is baseline  No facial swelling or asymmetry Normal voice-not hoarse and no slurred speech  (sounds congested) No obvious tremor or mobility impairment Moving neck and UEs normally Able to hear the call well  No wheeze or shortness of breath during interview  He does occ clear throat  Talkative and mentally sharp with no cognitive changes No skin changes on face or neck , no rash or pallor Affect is normal    Assessment and Plan: Problem List Items Addressed This Visit      Other   COVID-19    In pt fully immunized with booster Mild reactive airway symptoms (h/o asthma)  Px prednisone taper (disc side eff) inst to treat symptoms (may try mucinex or mucinex dm) Continue current allergy medicines Update if worse or new symptoms ER parameters discussed            Follow Up Instructions: Keep drinking fluids and rest  Use  albuterol inhaler or nebulizer treatment as needed  Take the prednisone as directed (if not helpful let us know) Continue to isolate yourself  Continue allergy medications  mucinex or mucinex dm may be helpful for cough and congestion  Nasal saline spray may help nasal congestion as well  If symptoms become severe (especially if short of breath) go to the ER Otherwise please let us know if symptoms worsen or do not improve    I discussed the assessment and treatment plan with the patient. The patient was provided an opportunity to ask questions and all were answered. The patient agreed with the plan and demonstrated an understanding of the instructions.   The patient was advised to call back or seek an in-person evaluation if the symptoms worsen or if the condition fails to improve as anticipated.     Loura Pardon, MD

## 2020-07-18 NOTE — Patient Instructions (Signed)
Keep drinking fluids and rest  Use albuterol inhaler or nebulizer treatment as needed  Take the prednisone as directed (if not helpful let us know) Continue to isolate yourself  Continue allergy medications  mucinex or mucinex dm may be helpful for cough and congestion  Nasal saline spray may help nasal congestion as well  If symptoms become severe (especially if short of breath) go to the ER Otherwise please let us know if symptoms worsen or do not improve

## 2020-07-29 ENCOUNTER — Telehealth: Payer: Self-pay

## 2020-07-29 MED ORDER — PREDNISONE 10 MG PO TABS
ORAL_TABLET | ORAL | 0 refills | Status: DC
Start: 2020-07-29 — End: 2020-08-21

## 2020-07-29 NOTE — Telephone Encounter (Signed)
Pt reports his breathing is not labored but he still has chest congestion. Denies actual SOB or chest pain. Pt reports he is using his albuterol inhaler PRN and nebulizer QD.  Advised pt of PCP instructions of what to be aware of and to alert clinic or go to ER if any symptoms worsen. Advised prednisone will be sent in but if symptoms do not improve to contact office. Pt would like sent to [facility] [place]. Sent in script. Pt verbalized understanding.

## 2020-07-29 NOTE — Telephone Encounter (Signed)
[place] Night - Client TELEPHONE ADVICE RECORD [facility] Patient Name: Clarence Alvarez Gender: Male DOB: December 13, 1965 Age: 55 Y 1 M 3 D Return Phone Number: [phone] (Primary), [phone] (Secondary) Address: City/State/Zip: [place] [address] Client [facility] [place] Night - Client Client Site [place] Physician Owens Loffler - MD Contact Type Call Who Is Calling Patient / Member / Family / Caregiver Call Type Triage / Clinical Relationship To Patient Self Return Phone Number [phone] (Primary) Chief Complaint CHEST PAIN (>=21 years) - pain, pressure, heaviness or tightness Reason for Call Symptomatic / Request for [place] states he was placed on prednisone on the 7th and 8th due to asthmatic symptoms exacerbated by COVID. Has been also using nebulizer when lungs distressed. Finishing up the tapering dose. Still have chest congestion and using Mucinex. Should the prednisone be extended for another five days or should he continue using nebulizer and Mucinex? Translation No Nurse Assessment Nurse: Ysidro Evert, RN, Levada Dy Date/Time (Eastern Time): 07/26/2020 8:53:25 AM Confirm and document reason for call. If symptomatic, describe symptoms. ---Caller states he tested positive for Covid early last week. He has chest congestion and has been taking prednisone. His symptoms are improving. No fever Does the patient have any new or worsening symptoms? ---Yes Will a triage be completed? ---Yes Related visit to physician within the last 2 weeks? ---No Does the PT have any chronic conditions? (i.e. diabetes, asthma, this includes High risk factors for pregnancy, etc.) ---Yes List chronic conditions. ---asthma, gerd Is this a behavioral health or substance abuse call? ---No Guidelines Guideline Title Affirmed Question Affirmed Notes Nurse Date/Time  (Eastern Time) COVID-19 - Diagnosed or Suspected [1] COVID-19 diagnosed by positive lab test (e.g., PCR, rapid selftest kit) AND [2] mild symptoms (e.g., cough, Ysidro Evert, RN, Levada Dy 07/26/2020 8:56:34 AM PLEASE NOTE: All timestamps contained within this report are represented as [place] Standard Time. CONFIDENTIALTY NOTICE: This fax transmission is intended only for the addressee. It contains information that is legally privileged, confidential or otherwise protected from use or disclosure. If you are not the intended recipient, you are strictly prohibited from reviewing, disclosing, copying using or disseminating any of this information or taking any action in reliance on or regarding this information. If you have received this fax in error, please notify us immediately by telephone so that we can arrange for its return to [place]. Phone: [phone], Toll-Free: [phone], Fax: [phone] Page: 2 of 2 Call Id: [id number] Guidelines Guideline Title Affirmed Question Affirmed Notes Nurse Date/Time Eilene Ghazi Time) fever, others) AND [1] no complications or SOB Disp. Time Eilene Ghazi Time) Disposition Final User 07/26/2020 8:51:53 AM Send to Urgent Queue Dineen Kid 07/26/2020 9:01:57 AM Home Care Yes Ysidro Evert, RN, Marin Shutter Disagree/Comply Comply Caller Understands Yes PreDisposition Did not know what to do Care Advice Given Per Guideline HOME CARE: * You should be able to treat this at home. CALL BACK IF: * Chest pain or difficulty breathing occurs * Fever over 103 F (39.4 C) CARE ADVICE given per COVID-19 - DIAGNOSED OR SUSPECTED (Adult) guideline. * You become worse

## 2020-07-29 NOTE — Telephone Encounter (Signed)
I pended another prednisone taper to send to pharmacy of choice    Watch closely- if short of breath or new chest pain- please alert [place] and go to ER if needed  (could indicate something else like pneumonia or blood clot)  If wheezing increases- same  Please keep [place] posted  If not improving with prednisone let us know (would need to be seen in person at covid clinic/etc)   Thanks

## 2020-08-07 ENCOUNTER — Telehealth: Payer: Self-pay

## 2020-08-07 NOTE — Telephone Encounter (Signed)
Pt said recently had + covid; now pt has new symptom with sharp debilitating pain on rt lower back on and off and sometimes feels like rt leg is going to collapse and leg does not want to work. The back pain started 08/01/20 and this week pt said the back pain has worsened and the rt leg wants to give way. ? Nerve problems or not sure what going on. Pt not having any trouble grasping with hands or no slurred speech or H/A. No available appts at [facility] and today the back pain is worse. Pain level is 8. Pt is presently at work but will probably go to [facility] on [address] for eval shortly. Pt will cb with update. Sending note to Dr [name].

## 2020-08-07 NOTE — Telephone Encounter (Signed)
Reasonable POC.  Covid would not usually act this way.

## 2020-08-08 ENCOUNTER — Other Ambulatory Visit: Payer: Self-pay

## 2020-08-08 ENCOUNTER — Ambulatory Visit
Admission: EM | Admit: 2020-08-08 | Discharge: 2020-08-08 | Disposition: A | Discharge status: Home / Self Care | Payer: Commercial Managed Care - PPO

## 2020-08-08 DIAGNOSIS — M5441 Lumbago with sciatica, right side: Secondary | ICD-10-CM

## 2020-08-08 MED ORDER — TIZANIDINE HCL 2 MG PO TABS: 2.0000 mg | ORAL_TABLET | Freq: Four times a day (QID) | ORAL | 0 refills | Status: AC | PRN

## 2020-08-08 NOTE — ED Provider Notes (Signed)
[facility]    CSN: [id number] Arrival date & time: 08/08/20  1145      History   Chief Complaint Chief Complaint  Patient presents with  . Back Pain    HPI Clarence Alvarez is a 55 y.o. male  With history as below presenting for worsening right low back pain.  Patient endorsing history of this, as well as left-sided back pain.  Pain radiates down right leg to behind knee.  Denies trauma/injury to the affected area and does not recall an inciting event.  Denies fever, saddle area anesthesia, lower extremity numbness/weakness, urinary retention, fecal incontinence.  Past Medical History:  Diagnosis Date  . Allergic rhinitis, seasonal   . Arthritis    right knee  . GERD (gastroesophageal reflux disease)   . History of adenomatous polyp of colon    03-25-2014  tubular adenoma's /  hyperplastic  . Hypertension   . Lactose intolerance   . Left ureteral stone    2018  . Mild obstructive sleep apnea    per study 2014, pt states he does not have sleep apnea  . Moderate persistent asthma    followed by pcp-- uncomplicated  . MRSA infection 2010   buttocks  . Sleep apnea    no CPAP used  . Traumatic complete tear of right rotator cuff 11/30/2018    Patient Active Problem List   Diagnosis Date Noted  . COVID-19 07/18/2020  . Diabetes mellitus type 2, diet-controlled ([place]) 05/16/2019  . Traumatic complete tear of right rotator cuff 11/30/2018  . Essential hypertension 03/20/2018  . Degeneration of intervertebral disc of cervical spine without disc herniation 02/29/2016  . OSA (obstructive sleep apnea) 01/26/2013  . ALLERGIC RHINITIS, SEASONAL 09/15/2009  . Asthma 09/08/2006    Past Surgical History:  Procedure Laterality Date  . [place] STUDY N/A 05/04/2018   Procedure: [place] STUDY;  Surgeon: Jonathon Bellows, MD;  Location: [facility] ENDOSCOPY;  Service: Gastroenterology;  Laterality: N/A;  . anterior cervucal  2017  . COLONOSCOPY  03/25/2014  . ESOPHAGEAL  MANOMETRY N/A 05/04/2018   Procedure: ESOPHAGEAL MANOMETRY (EM);  Surgeon: Jonathon Bellows, MD;  Location: [facility] ENDOSCOPY;  Service: Gastroenterology;  Laterality: N/A;  . ESOPHAGOGASTRODUODENOSCOPY (EGD) WITH PROPOFOL N/A 02/24/2018   Procedure: ESOPHAGOGASTRODUODENOSCOPY (EGD) WITH PROPOFOL;  Surgeon: Jonathon Bellows, MD;  Location: [facility] ENDOSCOPY;  Service: Gastroenterology;  Laterality: N/A;  . KNEE SURGERY Right 2016  . RESECTION DISTAL CLAVICAL Right 12/06/2018   Procedure: RESECTION DISTAL CLAVICAL AND DEBRIDEMENT OF THE SHOULDER;  Surgeon: Elsie Saas, MD;  Location: [place];  Service: Orthopedics;  Laterality: Right;  SCALENE BLOCK  . SHOULDER ARTHROSCOPY WITH ROTATOR CUFF REPAIR AND SUBACROMIAL DECOMPRESSION Left 07-24-2010  dr Noemi Chapel @  [facility]   and Labrum tear debridement  . SHOULDER ARTHROSCOPY WITH ROTATOR CUFF REPAIR AND SUBACROMIAL DECOMPRESSION Right 12/06/2018   Procedure: SHOULDER ARTHROSCOPY WITH ROTATOR CUFF REPAIR AND SUBACROMIAL DECOMPRESSION;  Surgeon: Elsie Saas, MD;  Location: [place];  Service: Orthopedics;  Laterality: Right;  SCALENE BLOCK  . UPPER GASTROINTESTINAL ENDOSCOPY         Home Medications    Prior to Admission medications   Medication Sig Start Date End Date Taking? Authorizing Provider  tiZANidine (ZANAFLEX) 2 MG tablet Take 1 tablet (2 mg total) by mouth every 6 (six) hours as needed for up to 5 days for muscle spasms. 08/08/20 08/13/20 Yes Hall-Potvin, [place], PA-C  albuterol (PROVENTIL) (2.5 MG/3ML) 0.083% nebulizer solution Take  3 mLs (2.5 mg total) by nebulization every 6 (six) hours as needed for wheezing or shortness of breath. 09/28/19   Ria Bush, MD  albuterol (VENTOLIN HFA) 108 (90 Base) MCG/ACT inhaler INHALE 2 PUFFS BY MOUTH 4 TIMES A DAY 10/22/19   Copland, Frederico Hamman, MD  amLODipine (NORVASC) 5 MG tablet TAKE 1 TABLET BY MOUTH EVERY DAY 04/05/20   Copland, Frederico Hamman, MD  Ascorbic Acid (VITAMIN C PO) Take by  mouth daily.    [provider]  cetirizine (ZYRTEC) 10 MG tablet Take 10 mg by mouth every morning.     [provider]  FLOVENT HFA 110 MCG/ACT inhaler INHALE 1 PUFF BY MOUTH TWICE A DAY 05/07/19   Copland, Frederico Hamman, MD  fluticasone (FLONASE) 50 MCG/ACT nasal spray SPRAY 2 SPRAYS INTO EACH NOSTRIL EVERY DAY 05/20/20   Copland, Frederico Hamman, MD  omeprazole (PRILOSEC) 20 MG capsule TAKE 1 CAPSULE BY MOUTH EVERY DAY 04/05/20   Copland, Frederico Hamman, MD  predniSONE (DELTASONE) 10 MG tablet Take 4 pills once daily by mouth for 3 days, then 3 pills daily for 3 days, then 2 pills daily for 3 days then 1 pill daily for 3 days then stop 07/29/20   Tower, Wynelle Fanny, MD  sildenafil (VIAGRA) 100 MG tablet Take 0.5-1 tablets (50-100 mg total) by mouth daily as needed for erectile dysfunction. 05/18/20   Copland, Frederico Hamman, MD  albuterol (PROVENTIL,VENTOLIN) 90 MCG/ACT inhaler Inhale 2 puffs into the lungs every 6 (six) hours as needed for wheezing. 02/15/11 11/30/18  Katherina Mires, MD    Family History Family History  Problem Relation Age of Onset  . Diabetes Maternal Grandmother   . Colon cancer Maternal Grandmother        dx age 33 - recurance at age 44  . Diabetes Mother   . Hepatitis C Mother   . Prostate cancer Father   . Colon cancer Father        dx - 70's  . Diabetes Paternal Grandmother   . Mental illness Brother   . Mental illness Sister   . Mental illness Daughter   . Stomach cancer Maternal Grandfather   . COPD Other        Aunt  . Diabetes Other        siblings  . Pancreatic cancer Paternal Aunt   . Esophageal cancer Neg Hx   . Rectal cancer Neg Hx     Social History Social History   Tobacco Use  . Smoking status: Former Smoker    Years: 3.00    Types: Cigarettes    Quit date: 07/12/1984    Years since quitting: 36.0  . Smokeless tobacco: Former Systems developer    Types: Snuff    Quit date: 2017  . Tobacco comment: "EXPERIMENTED" W/SMOKING--NOT EVEN A PACK X 1 WEEK.   Vaping Use  .  Vaping Use: Never used  Substance Use Topics  . Alcohol use: Not Currently    Alcohol/week: 3.0 standard drinks    Types: 3 Shots of liquor per week    Comment: 1-2 drinks per week  . Drug use: No     Allergies   Patient has no known allergies.   Review of Systems As per HPI   Physical Exam Triage Vital Signs ED Triage Vitals  Enc Vitals Group     BP 08/08/20 1220 (!) 143/91     Pulse Rate 08/08/20 1220 77     Resp 08/08/20 1220 18     Temp 08/08/20 1220 97.9  F (36.6 C)     Temp Source 08/08/20 1220 Oral     SpO2 08/08/20 1220 96 %     Weight --      Height --      Head Circumference --      Peak Flow --      Pain Score 08/08/20 1221 7     Pain Loc --      Pain Edu? --      Excl. in [place]? --    No data found.  Updated Vital Signs BP (!) 143/91 (BP Location: Left Arm)   Pulse 77   Temp 97.9 F (36.6 C) (Oral)   Resp 18   SpO2 96%   Visual Acuity Right Eye Distance:   Left Eye Distance:   Bilateral Distance:    Right Eye Near:   Left Eye Near:    Bilateral Near:     Physical Exam Constitutional:      General: He is not in acute distress. HENT:     Head: Normocephalic and atraumatic.  Eyes:     General: No scleral icterus.    Pupils: Pupils are equal, round, and reactive to light.  Cardiovascular:     Rate and Rhythm: Normal rate.  Pulmonary:     Effort: Pulmonary effort is normal. No respiratory distress.     Breath sounds: No wheezing.  Musculoskeletal:        General: Tenderness present. No swelling. Normal range of motion.     Comments: Diffuse right lumbar tenderness that spares sspinous process, PSIS.  Positive SLR right, negative left.  Skin:    Coloration: Skin is not jaundiced or pale.     Findings: No bruising or rash.  Neurological:     General: No focal deficit present.     Mental Status: He is alert and oriented to person, place, and time.      UC Treatments / Results  Labs (all labs ordered are listed, but only abnormal  results are displayed) Labs Reviewed - No data to display  EKG   Radiology No results found.  Procedures Procedures (including critical care time)  Medications Ordered in UC Medications - No data to display  Initial Impression / Assessment and Plan / UC Course  I have reviewed the triage vital signs and the nursing notes.  Pertinent labs & imaging results that were available during my care of the patient were reviewed by me and considered in my medical decision making (see chart for details).     H&P consistent with acute right-sided low back pain with right-sided sciatica.  Will treat supportively as below, continue following up with PCP, Ortho.  Return precautions discussed, pt verbalized understanding and is agreeable to plan. Final Clinical Impressions(s) / UC Diagnoses   Final diagnoses:  Acute right-sided low back pain with right-sided sciatica   Discharge Instructions   None    ED Prescriptions    Medication Sig Dispense Auth. Provider   tiZANidine (ZANAFLEX) 2 MG tablet Take 1 tablet (2 mg total) by mouth every 6 (six) hours as needed for up to 5 days for muscle spasms. 20 tablet Hall-Potvin, [place], PA-C     I have reviewed the PDMP during this encounter.   Hall-Potvin, [place], [place] 08/08/20 1820

## 2020-08-08 NOTE — ED Triage Notes (Signed)
Pt c/o lower back pain radiating down rt leg x2wks. Denies injury. States had covid 01/06, place prednisone d/t hx of asthma is currently still on prednisone.

## 2020-08-19 ENCOUNTER — Telehealth: Payer: Self-pay | Admitting: *Deleted

## 2020-08-19 NOTE — Telephone Encounter (Signed)
I am happy to evaluate him.

## 2020-08-19 NOTE — Telephone Encounter (Signed)
Patient called stating that he called several weeks ago and was advised to go to an Urgent Care. Patient stated that he went and was given a muscle relaxer. Patient stated that he continues to have the pain that comes and goes. Patient stated over the weekend he was fine. Patient stated yesterday and last night the pain started back. Patient stated that the pain woke him up during the night. Patient stated that he can not see Dr. Noemi Chapel until 08/25/20, but needs something to help with the pain until that appointment. Offered patient an appointment tomorrow which he declined. Patient stated that Dr. [name] is his PCP so appointment was scheduled with him on 08/21/20 at 3:40. Patient was advised that this message will go back to Dr. [name] for his review but he is out of the office until Thursday. [facility]acy  [facility] [employer] [facility]

## 2020-08-21 ENCOUNTER — Other Ambulatory Visit: Payer: Self-pay

## 2020-08-21 ENCOUNTER — Ambulatory Visit (INDEPENDENT_AMBULATORY_CARE_PROVIDER_SITE_OTHER): Payer: Commercial Managed Care - PPO | Admitting: Family Medicine

## 2020-08-21 ENCOUNTER — Encounter: Payer: Self-pay | Admitting: Family Medicine

## 2020-08-21 VITALS — BP 126/82 | HR 93 | Temp 98.4°F | Ht 71.5 in | Wt 255.8 lb

## 2020-08-21 DIAGNOSIS — J4541 Moderate persistent asthma with (acute) exacerbation: Secondary | ICD-10-CM | POA: Diagnosis not present

## 2020-08-21 DIAGNOSIS — M5416 Radiculopathy, lumbar region: Secondary | ICD-10-CM | POA: Diagnosis not present

## 2020-08-21 MED ORDER — INDOMETHACIN 50 MG PO CAPS: 50.0000 mg | ORAL_CAPSULE | Freq: Three times a day (TID) | ORAL | 1 refills | Status: DC

## 2020-08-21 MED ORDER — CYCLOBENZAPRINE HCL 10 MG PO TABS
5.0000 mg | ORAL_TABLET | Freq: Every evening | ORAL | 1 refills | Status: DC | PRN
Start: 2020-08-21 — End: 2020-11-19

## 2020-08-21 NOTE — Progress Notes (Signed)
Kevaughn Ewing T. Carmeline Kowal, MD, [place], [id number]  Phone: [phone]  FAX: [phone]  ONESIMO LINGARD - 55 y.o. male  MRN [id number]  Date of Birth: 1966/06/02  Date: 08/21/2020  PCP: Owens Loffler, MD  Referral: Owens Loffler, MD  Chief Complaint  Patient presents with  . Back Pain    Seen in ED 08/08/2020    This visit occurred during the SARS-CoV-2 public health emergency.  Safety protocols were in place, including screening questions prior to the visit, additional usage of staff PPE, and extensive cleaning of exam room while observing appropriate contact time as indicated for disinfecting solutions.   Subjective:   [name] is a 55 y.o. very pleasant male patient with Body mass index is 35.17 kg/m. who presents with the following:  [facility] 08/08/2020:  R >> L acute with prior back pain. R back.  Was doing fine, and then he caught Covid in January.  At that point he has been having some ongoing SOB and wheezing, and he has been on some prolonged courses of oral steroids.  1/7 - until yesterday until now.  Was still having a lot of asthma, using his nebulizer.    Got some Zanaflex in UC - taking every 6 hours.  Not sleeping well, but he has been taking Zanaflex several times a day, and he fell asleep at times when that was less than ideal.  R leg - going numb but it will come and go. Doing some calisthenic.   Has gained some weight.  Pain got really bad, none since then. R leg will lag. All radicular.   3 weeks in total with the R radiculopathy.  Review of Systems is noted in the HPI, as appropriate   Objective:   BP 126/82   Pulse 93   Temp 98.4 F (36.9 C) (Temporal)   Ht 5' 11.5" (1.816 m)   Wt 255 lb 12 oz (116 kg)   BMI 35.17 kg/m    Range of motion at  the waist: Flexion, extension, lateral bending and rotation:  He does have some limitations in extension and forward flexion, these do cause him some pain.  No echymosis or edema Rises to examination table with mild difficulty Gait: minimally antalgic  Inspection/Deformity: N Paraspinus Tenderness: He does have some tenderness around L3-S1 bilaterally.  B Ankle Dorsiflexion (L5,4): 5/5 B Great Toe Dorsiflexion (L5,4): 5/5 Heel Walk (L5): WNL Toe Walk (S1): WNL Rise/Squat (L4): WNL, mild pain  SENSORY B Medial Foot (L4): WNL B Dorsum (L5): WNL B Lateral (S1): WNL Light Touch: WNL Pinprick: WNL  REFLEXES Knee (L4): 1+ Ankle (S1): 1+  B SLR, seated: neg B SLR, supine: neg B FABER: neg B Reverse FABER: neg B Greater Troch: NT B Log Roll: neg B Sciatic Notch: NT   Radiology: No results found.  Assessment and Plan:     ICD-10-CM   1. Lumbar radiculopathy, acute  M54.16   2. Moderate persistent asthma with exacerbation  J45.41    Acute onset right back pain with exacerbation of prior back pain.  I would like to avoid additional steroids if possible.  Trial of Indocin plus Flexeril at nighttime only.  Continue with some basic stretching, and he is very active throughout the day.  If his symptoms persist, formal physical therapy would be reasonable.  He has seen Dr. Ron Agee in the past  as well.  Increase Flovent dosing to twice daily.  I would like to keep him off of oral steroids if possible.  Suspected COVID-19 is playing a significant role here.  Meds ordered this encounter  Medications  . indomethacin (INDOCIN) 50 MG capsule    Sig: Take 1 capsule (50 mg total) by mouth 3 (three) times daily with meals.    Dispense:  60 capsule    Refill:  1  . cyclobenzaprine (FLEXERIL) 10 MG tablet    Sig: Take 0.5-1 tablets (5-10 mg total) by mouth at bedtime as needed for muscle spasms.    Dispense:  30 tablet    Refill:  1   Medications Discontinued During This Encounter  Medication Reason  . predniSONE (DELTASONE) 10 MG tablet  Completed Course   No orders of the defined types were placed in this encounter.   Follow-up: No follow-ups on file.  Signed,  Maud Deed. Lailyn Appelbaum, MD   Outpatient Encounter Medications as of 08/21/2020  Medication Sig  . albuterol (PROVENTIL) (2.5 MG/3ML) 0.083% nebulizer solution Take 3 mLs (2.5 mg total) by nebulization every 6 (six) hours as needed for wheezing or shortness of breath.  Marland Kitchen albuterol (VENTOLIN HFA) 108 (90 Base) MCG/ACT inhaler INHALE 2 PUFFS BY MOUTH 4 TIMES A DAY  . amLODipine (NORVASC) 5 MG tablet TAKE 1 TABLET BY MOUTH EVERY DAY  . Ascorbic Acid (VITAMIN C PO) Take by mouth daily.  . cetirizine (ZYRTEC) 10 MG tablet Take 10 mg by mouth every morning.   . cyclobenzaprine (FLEXERIL) 10 MG tablet Take 0.5-1 tablets (5-10 mg total) by mouth at bedtime as needed for muscle spasms.  Marland Kitchen FLOVENT HFA 110 MCG/ACT inhaler INHALE 1 PUFF BY MOUTH TWICE A DAY  . fluticasone (FLONASE) 50 MCG/ACT nasal spray SPRAY 2 SPRAYS INTO EACH NOSTRIL EVERY DAY  . indomethacin (INDOCIN) 50 MG capsule Take 1 capsule (50 mg total) by mouth 3 (three) times daily with meals.  Marland Kitchen omeprazole (PRILOSEC) 20 MG capsule TAKE 1 CAPSULE BY MOUTH EVERY DAY  . sildenafil (VIAGRA) 100 MG tablet Take 0.5-1 tablets (50-100 mg total) by mouth daily as needed for erectile dysfunction.  . [DISCONTINUED] albuterol (PROVENTIL,VENTOLIN) 90 MCG/ACT inhaler Inhale 2 puffs into the lungs every 6 (six) hours as needed for wheezing.  . [DISCONTINUED] predniSONE (DELTASONE) 10 MG tablet Take 4 pills once daily by mouth for 3 days, then 3 pills daily for 3 days, then 2 pills daily for 3 days then 1 pill daily for 3 days then stop   No facility-administered encounter medications on file as of 08/21/2020.

## 2020-08-26 ENCOUNTER — Telehealth: Payer: Self-pay | Admitting: *Deleted

## 2020-08-26 NOTE — Telephone Encounter (Signed)
Patient called stating that he thinks that he has a sinus infection. Patent stated that he started about a week and a half ago with a headache, dry cough, sinus pressure and drainage. Patient stated when he blows his nose in the mornings there is blood mixed in with the congestion. Patient stated that he had covid back in January and feels that he is having these problems post covid. . Patient stated that he was on Prednisone for almost the whole month of January and does not want to be on that again.Patient denies a fever or SOB other than what he usually has because of asthma.   Advised patient that our providers will ususally not send in an antibiotic without at least a virtual visit. Patient declined a visit. Patient stated that he was recently in and requested that this message go back to Dr. [name]. Advised patient that Dr. [name] is out of the office today and he verbalized understanding. Patient stated that it will be okay to hear back from him tomorrow. [facility]

## 2020-08-27 MED ORDER — AMOXICILLIN-POT CLAVULANATE 875-125 MG PO TABS: 1.0000 | ORAL_TABLET | Freq: Two times a day (BID) | ORAL | 0 refills | Status: DC

## 2020-08-27 NOTE — Telephone Encounter (Signed)
[facility] [address] [facility] instructed by Dr. [name].  Mr. Preece notified of this via telephone.

## 2020-08-27 NOTE — Telephone Encounter (Signed)
Reasonable  Augmentin 875 mg, 1 po BID, #20

## 2020-11-11 ENCOUNTER — Other Ambulatory Visit: Payer: Self-pay | Admitting: Family Medicine

## 2020-11-19 ENCOUNTER — Other Ambulatory Visit: Payer: Self-pay | Admitting: Family Medicine

## 2020-11-19 NOTE — Telephone Encounter (Signed)
Last office visit 08/21/2020 for back pain.  Last refilled 08/21/2020 for #30 with 1 refill.  No future appointments.

## 2020-12-10 DIAGNOSIS — Z5189 Encounter for other specified aftercare: Secondary | ICD-10-CM

## 2020-12-10 HISTORY — DX: Encounter for other specified aftercare: Z51.89

## 2020-12-12 ENCOUNTER — Emergency Department (HOSPITAL_COMMUNITY)
Admission: EM | Admit: 2020-12-12 | Discharge: 2020-12-12 | Disposition: A | Discharge status: Home / Self Care | Payer: No Typology Code available for payment source | Source: Home / Self Care | Attending: Emergency Medicine | Admitting: Emergency Medicine

## 2020-12-12 ENCOUNTER — Emergency Department (HOSPITAL_COMMUNITY): Payer: No Typology Code available for payment source

## 2020-12-12 ENCOUNTER — Encounter (HOSPITAL_BASED_OUTPATIENT_CLINIC_OR_DEPARTMENT_OTHER): Payer: Self-pay | Admitting: Orthopaedic Surgery

## 2020-12-12 ENCOUNTER — Encounter (HOSPITAL_COMMUNITY): Payer: Self-pay | Admitting: Pharmacy Technician

## 2020-12-12 ENCOUNTER — Other Ambulatory Visit: Payer: Self-pay

## 2020-12-12 DIAGNOSIS — Z87891 Personal history of nicotine dependence: Secondary | ICD-10-CM | POA: Insufficient documentation

## 2020-12-12 DIAGNOSIS — Z7952 Long term (current) use of systemic steroids: Secondary | ICD-10-CM | POA: Insufficient documentation

## 2020-12-12 DIAGNOSIS — Y990 Civilian activity done for income or pay: Secondary | ICD-10-CM | POA: Insufficient documentation

## 2020-12-12 DIAGNOSIS — I10 Essential (primary) hypertension: Secondary | ICD-10-CM | POA: Insufficient documentation

## 2020-12-12 DIAGNOSIS — Z8616 Personal history of COVID-19: Secondary | ICD-10-CM | POA: Insufficient documentation

## 2020-12-12 DIAGNOSIS — S36039A Unspecified laceration of spleen, initial encounter: Secondary | ICD-10-CM | POA: Diagnosis not present

## 2020-12-12 DIAGNOSIS — Z79899 Other long term (current) drug therapy: Secondary | ICD-10-CM | POA: Insufficient documentation

## 2020-12-12 DIAGNOSIS — S42012A Anterior displaced fracture of sternal end of left clavicle, initial encounter for closed fracture: Secondary | ICD-10-CM

## 2020-12-12 DIAGNOSIS — E119 Type 2 diabetes mellitus without complications: Secondary | ICD-10-CM | POA: Insufficient documentation

## 2020-12-12 DIAGNOSIS — J45909 Unspecified asthma, uncomplicated: Secondary | ICD-10-CM | POA: Insufficient documentation

## 2020-12-12 DIAGNOSIS — S42022A Displaced fracture of shaft of left clavicle, initial encounter for closed fracture: Secondary | ICD-10-CM

## 2020-12-12 DIAGNOSIS — W01198A Fall on same level from slipping, tripping and stumbling with subsequent striking against other object, initial encounter: Secondary | ICD-10-CM | POA: Insufficient documentation

## 2020-12-12 DIAGNOSIS — K661 Hemoperitoneum: Secondary | ICD-10-CM | POA: Diagnosis not present

## 2020-12-12 DIAGNOSIS — W19XXXA Unspecified fall, initial encounter: Secondary | ICD-10-CM

## 2020-12-12 HISTORY — DX: Anterior displaced fracture of sternal end of left clavicle, initial encounter for closed fracture: S42.012A

## 2020-12-12 MED ORDER — CYCLOBENZAPRINE HCL 10 MG PO TABS
10.0000 mg | ORAL_TABLET | Freq: Once | ORAL | Status: AC
  Administered 2020-12-12: 10 mg via ORAL
  Filled 2020-12-12: qty 1

## 2020-12-12 MED ORDER — OXYCODONE HCL 5 MG PO TABS
5.0000 mg | ORAL_TABLET | Freq: Four times a day (QID) | ORAL | 0 refills | Status: DC | PRN
Start: 2020-12-12 — End: 2020-12-24

## 2020-12-12 MED ORDER — OXYCODONE-ACETAMINOPHEN 5-325 MG PO TABS
2.0000 | ORAL_TABLET | Freq: Once | ORAL | Status: AC
  Administered 2020-12-12: 2 via ORAL
  Filled 2020-12-12: qty 2

## 2020-12-12 NOTE — ED Provider Notes (Signed)
[facility] EMERGENCY DEPARTMENT Provider Note   CSN: [id number] Arrival date & time: 12/12/20  0850     History Chief Complaint  Patient presents with  . Fall  . Shoulder Injury    Clarence Alvarez is a 55 y.o. male.  55 year old male with history of diabetes (not insulin dependent), HTN, presents with left shoulder pain after a mechanical fall at work today. Patient was walking at work, carrying an arm load of things when he pushed a board out of his way with his foot causing him to fall, landing on his left shoulder. No LOC, not anticoagulated, no other injuries, has been ambulatory since the fall without difficulty. Prior rotator cuff surgery with Dr. Noemi Chapel.         Past Medical History:  Diagnosis Date  . Allergic rhinitis, seasonal   . Arthritis    right knee  . GERD (gastroesophageal reflux disease)   . History of adenomatous polyp of colon    03-25-2014  tubular adenoma's /  hyperplastic  . Hypertension   . Lactose intolerance   . Left ureteral stone    2018  . Mild obstructive sleep apnea    per study 2014, pt states he does not have sleep apnea  . Moderate persistent asthma    followed by pcp-- uncomplicated  . MRSA infection 2010   buttocks  . Sleep apnea    no CPAP used  . Traumatic complete tear of right rotator cuff 11/30/2018    Patient Active Problem List   Diagnosis Date Noted  . COVID-19 07/18/2020  . Diabetes mellitus type 2, diet-controlled ([place]) 05/16/2019  . Traumatic complete tear of right rotator cuff 11/30/2018  . Essential hypertension 03/20/2018  . Degeneration of intervertebral disc of cervical spine without disc herniation 02/29/2016  . OSA (obstructive sleep apnea) 01/26/2013  . ALLERGIC RHINITIS, SEASONAL 09/15/2009  . Asthma 09/08/2006    Past Surgical History:  Procedure Laterality Date  . [place] STUDY N/A 05/04/2018   Procedure: [place] STUDY;  Surgeon: Jonathon Bellows, MD;  Location: [facility] ENDOSCOPY;   Service: Gastroenterology;  Laterality: N/A;  . anterior cervucal  2017  . COLONOSCOPY  03/25/2014  . ESOPHAGEAL MANOMETRY N/A 05/04/2018   Procedure: ESOPHAGEAL MANOMETRY (EM);  Surgeon: Jonathon Bellows, MD;  Location: [facility] ENDOSCOPY;  Service: Gastroenterology;  Laterality: N/A;  . ESOPHAGOGASTRODUODENOSCOPY (EGD) WITH PROPOFOL N/A 02/24/2018   Procedure: ESOPHAGOGASTRODUODENOSCOPY (EGD) WITH PROPOFOL;  Surgeon: Jonathon Bellows, MD;  Location: [facility] ENDOSCOPY;  Service: Gastroenterology;  Laterality: N/A;  . KNEE SURGERY Right 2016  . RESECTION DISTAL CLAVICAL Right 12/06/2018   Procedure: RESECTION DISTAL CLAVICAL AND DEBRIDEMENT OF THE SHOULDER;  Surgeon: Elsie Saas, MD;  Location: [facility];  Service: Orthopedics;  Laterality: Right;  SCALENE BLOCK  . SHOULDER ARTHROSCOPY WITH ROTATOR CUFF REPAIR AND SUBACROMIAL DECOMPRESSION Left 07-24-2010  dr Noemi Chapel @  [facility]   and Labrum tear debridement  . SHOULDER ARTHROSCOPY WITH ROTATOR CUFF REPAIR AND SUBACROMIAL DECOMPRESSION Right 12/06/2018   Procedure: SHOULDER ARTHROSCOPY WITH ROTATOR CUFF REPAIR AND SUBACROMIAL DECOMPRESSION;  Surgeon: Elsie Saas, MD;  Location: [place];  Service: Orthopedics;  Laterality: Right;  SCALENE BLOCK  . UPPER GASTROINTESTINAL ENDOSCOPY         Family History  Problem Relation Age of Onset  . Diabetes Maternal Grandmother   . Colon cancer Maternal Grandmother        dx age 7 - recurance at age 52  .  Diabetes Mother   . Hepatitis C Mother   . Prostate cancer Father   . Colon cancer Father        dx - 78's  . Diabetes Paternal Grandmother   . Mental illness Brother   . Mental illness Sister   . Mental illness Daughter   . Stomach cancer Maternal Grandfather   . COPD Other        Aunt  . Diabetes Other        siblings  . Pancreatic cancer Paternal Aunt   . Esophageal cancer Neg Hx   . Rectal cancer Neg Hx     Social History   Tobacco Use  . Smoking status: Former  Smoker    Years: 3.00    Types: Cigarettes    Quit date: 07/12/1984    Years since quitting: 36.4  . Smokeless tobacco: Former Systems developer    Types: Snuff    Quit date: 2017  . Tobacco comment: "EXPERIMENTED" W/SMOKING--NOT EVEN A PACK X 1 WEEK.   Vaping Use  . Vaping Use: Never used  Substance Use Topics  . Alcohol use: Not Currently    Alcohol/week: 3.0 standard drinks    Types: 3 Shots of liquor per week    Comment: 1-2 drinks per week  . Drug use: No    Home Medications Prior to Admission medications   Medication Sig Start Date End Date Taking? Authorizing Provider  oxyCODONE (ROXICODONE) 5 MG immediate release tablet Take 1 tablet (5 mg total) by mouth every 6 (six) hours as needed for moderate pain or severe pain. 12/12/20  Yes Tacy Learn, PA-C  albuterol (PROVENTIL) (2.5 MG/3ML) 0.083% nebulizer solution Take 3 mLs (2.5 mg total) by nebulization every 6 (six) hours as needed for wheezing or shortness of breath. 09/28/19   Ria Bush, MD  albuterol (VENTOLIN HFA) 108 (90 Base) MCG/ACT inhaler INHALE 2 PUFFS BY MOUTH 4 TIMES A DAY 10/22/19   Copland, Frederico Hamman, MD  amLODipine (NORVASC) 5 MG tablet TAKE 1 TABLET BY MOUTH EVERY DAY 11/11/20   Copland, Frederico Hamman, MD  amoxicillin-clavulanate (AUGMENTIN) 875-125 MG tablet Take 1 tablet by mouth 2 (two) times daily. 08/27/20   Copland, Frederico Hamman, MD  Ascorbic Acid (VITAMIN C PO) Take by mouth daily.    [provider]  cetirizine (ZYRTEC) 10 MG tablet Take 10 mg by mouth every morning.     [provider]  cyclobenzaprine (FLEXERIL) 10 MG tablet TAKE 0.5-1 TABLETS (5-10 MG TOTAL) BY MOUTH AT BEDTIME AS NEEDED FOR MUSCLE SPASMS. 11/19/20 11/19/21  Copland, Frederico Hamman, MD  FLOVENT HFA 110 MCG/ACT inhaler INHALE 1 PUFF BY MOUTH TWICE A DAY 05/07/19   Copland, Frederico Hamman, MD  fluticasone (FLONASE) 50 MCG/ACT nasal spray SPRAY 2 SPRAYS INTO EACH NOSTRIL EVERY DAY 05/20/20   Copland, Frederico Hamman, MD  indomethacin (INDOCIN) 50 MG capsule Take 1  capsule (50 mg total) by mouth 3 (three) times daily with meals. 08/21/20   Copland, Frederico Hamman, MD  omeprazole (PRILOSEC) 20 MG capsule TAKE 1 CAPSULE BY MOUTH EVERY DAY 04/05/20   Copland, Frederico Hamman, MD  sildenafil (VIAGRA) 100 MG tablet Take 0.5-1 tablets (50-100 mg total) by mouth daily as needed for erectile dysfunction. 05/18/20   Copland, Frederico Hamman, MD  albuterol (PROVENTIL,VENTOLIN) 90 MCG/ACT inhaler Inhale 2 puffs into the lungs every 6 (six) hours as needed for wheezing. 02/15/11 11/30/18  Katherina Mires, MD    Allergies    Patient has no known allergies.  Review of Systems   Review of  Systems  Constitutional: Negative for fever.  Musculoskeletal: Positive for arthralgias and myalgias. Negative for back pain, gait problem, joint swelling, neck pain and neck stiffness.  Skin: Negative for color change, rash and wound.  Allergic/Immunologic: Positive for immunocompromised state.  Neurological: Negative for weakness and numbness.  Hematological: Does not bruise/bleed easily.  Psychiatric/Behavioral: Negative for confusion.    Physical Exam Updated Vital Signs BP (!) 161/93   Pulse 85   Temp 97.7 F (36.5 C) (Oral)   Resp 18   SpO2 95%   Physical Exam Vitals and nursing note reviewed.  Constitutional:      General: He is not in acute distress.    Appearance: He is well-developed. He is not diaphoretic.  HENT:     Head: Normocephalic and atraumatic.  Eyes:     Extraocular Movements: Extraocular movements intact.     Pupils: Pupils are equal, round, and reactive to light.  Cardiovascular:     Pulses: Normal pulses.  Pulmonary:     Effort: Pulmonary effort is normal.  Musculoskeletal:        General: Swelling, tenderness and deformity present.     Left elbow: Normal.     Right wrist: Normal.     Left wrist: Normal.     Cervical back: Normal, normal range of motion and neck supple. No tenderness or bony tenderness. No pain with movement. Normal range of motion.     Thoracic  back: No tenderness or bony tenderness.     Lumbar back: No tenderness or bony tenderness.     Comments: Deformity over left clavicle, radial pulse present, sensation intact.   Skin:    General: Skin is warm and dry.     Findings: No erythema or rash.  Neurological:     Mental Status: He is alert and oriented to person, place, and time.     Sensory: No sensory deficit.     Motor: No weakness.  Psychiatric:        Behavior: Behavior normal.     ED Results / Procedures / Treatments   Labs (all labs ordered are listed, but only abnormal results are displayed) Labs Reviewed - No data to display  EKG None  Radiology DG Clavicle Left  Result Date: 12/12/2020 CLINICAL DATA:  55 year old male status post fall with pain. EXAM: LEFT CLAVICLE - 2+ VIEWS COMPARISON:  Left shoulder series today reported separately. Chest radiographs 08/14/2017. FINDINGS: Chronic lower cervical ACDF. Comminuted fracture of the left midclavicle with mildly displaced butterfly fragments, over riding, 1/2 shaft width inferior displacement, and inferior angulation of the distal fragment. The acromioclavicular and coracoclavicular intervals appear to remain normal. Negative visible left lung apex. IMPRESSION: Comminuted left midclavicle fracture. Electronically Signed   By: Genevie Ann M.D.   On: 12/12/2020 09:51   DG Shoulder Left  Result Date: 12/12/2020 CLINICAL DATA:  Fall.  Shoulder pain EXAM: LEFT SHOULDER - 2+ VIEW COMPARISON:  None FINDINGS: Midshaft LEFT clavicle fracture with caudal angulation. Mild comminution at the fracture plane. LEFT shoulder joint tact.  Anterior cervical fusion noted IMPRESSION: Midshaft LEFT clavicle fracture. Electronically Signed   By: Suzy Bouchard M.D.   On: 12/12/2020 09:53    Procedures Procedures   Medications Ordered in ED Medications  oxyCODONE-acetaminophen (PERCOCET/ROXICET) 5-325 MG per tablet 2 tablet (2 tablets Oral Given 12/12/20 0903)  cyclobenzaprine (FLEXERIL)  tablet 10 mg (10 mg Oral Given 12/12/20 6063)    ED Course  I have reviewed the triage vital signs and the nursing  notes.  Pertinent labs & imaging results that were available during my care of the patient were reviewed by me and considered in my medical decision making (see chart for details).  Clinical Course as of 12/12/20 1019  Fri Dec 12, 3020  3332 55 year old male with left clavicle deformity and left shoulder pain after a mechanical fall at work today, no LOC. No midline neck or back tenderness. Strong radial pulse with sensation intact left arm. XR shows displaced clavicle fracture. Given percocet for pain as well as flexeril for spasms. Placed in shoulder immobilizer and referred to his orthopedist for follow up.  Given rx for oxycodone (norco shortage), has rx for flexeril on file.  [LM]    Clinical Course User Index [LM] Roque Lias   MDM Rules/Calculators/A&P                          Final Clinical Impression(s) / ED Diagnoses Final diagnoses:  Closed displaced fracture of shaft of left clavicle, initial encounter  Fall, initial encounter    Rx / DC Orders ED Discharge Orders         Ordered    oxyCODONE (ROXICODONE) 5 MG immediate release tablet  Every 6 hours PRN        12/12/20 0946           Tacy Learn, PA-C 12/12/20 1019    Lacretia Leigh, MD 12/14/20 1636

## 2020-12-12 NOTE — ED Notes (Signed)
Pt unable to elevate left shoulder due to pain.

## 2020-12-12 NOTE — ED Notes (Signed)
Patient transported to X-ray 

## 2020-12-12 NOTE — ED Triage Notes (Signed)
Pt has 2+ left radial pulse, cap refill less than 3 sec, warm to touch, sensation intact, 5/5 left hand grip.

## 2020-12-12 NOTE — ED Triage Notes (Signed)
Pt here pov with reports of falling and hitting the ground on his L shoulder. Pt with pain radiating from L shoulder down his back. Pt denies LOC, denies blood thinners.

## 2020-12-12 NOTE — Progress Notes (Signed)
Spoke w/ via phone for pre-op interview---pt Lab needs dos----  I state, ekg             Lab results------none COVID test -----patient states asymptomatic no test needed Arrive at -------930 am 12-17-2020 NPO after MN NO Solid Food.  Clear liquids from MN until---830 am then npo Med rec completed Medications to take morning of surgery -----albuterol nebulizer, albuterol inhaler pnr/bring inhaler, amlodipine, flovent, omeprazole, oxycodone prn,  zyrtec Diabetic medication -----n/a Patient instructed no nail polish to be worn day of surgery Patient instructed to bring photo id and insurance card day of surgery Patient aware to have Driver (ride ) / caregiver wife jeanette will stay    for 24 hours after surgery  Patient Special Instructions -----none Pre-Op special Istructions -----none Patient verbalized understanding of instructions that were given at this phone interview. Patient denies shortness of breath, chest pain, fever, cough at this phone interview.

## 2020-12-12 NOTE — ED Notes (Signed)
Pt states that he is not driving and that he is having someone to pick him up from the hospital.

## 2020-12-12 NOTE — Discharge Instructions (Addendum)
Wear shoulder immobilizer. Take oxycodone as needed for pain as prescribed.  Take Flexeril as previously prescribed for muscle spasms as needed. Do not drive or operate machinery while taking oxycodone or flexeril.  These medications can cause constipation, take Colace as needed as directed. Apply ice to area for 20 minutes at a time.  Follow up with your orthopedist.

## 2020-12-13 ENCOUNTER — Emergency Department (HOSPITAL_COMMUNITY): Payer: No Typology Code available for payment source

## 2020-12-13 ENCOUNTER — Inpatient Hospital Stay (HOSPITAL_COMMUNITY)
Admission: EM | Admit: 2020-12-13 | Discharge: 2020-12-20 | DRG: 981 | Disposition: A | Discharge status: Home / Self Care | Payer: No Typology Code available for payment source | Attending: General Surgery | Admitting: General Surgery

## 2020-12-13 ENCOUNTER — Encounter (HOSPITAL_COMMUNITY): Payer: Self-pay

## 2020-12-13 ENCOUNTER — Other Ambulatory Visit: Payer: Self-pay

## 2020-12-13 DIAGNOSIS — E119 Type 2 diabetes mellitus without complications: Secondary | ICD-10-CM | POA: Diagnosis present

## 2020-12-13 DIAGNOSIS — Z91040 Latex allergy status: Secondary | ICD-10-CM

## 2020-12-13 DIAGNOSIS — G4733 Obstructive sleep apnea (adult) (pediatric): Secondary | ICD-10-CM | POA: Diagnosis present

## 2020-12-13 DIAGNOSIS — I10 Essential (primary) hypertension: Secondary | ICD-10-CM | POA: Diagnosis present

## 2020-12-13 DIAGNOSIS — Z7951 Long term (current) use of inhaled steroids: Secondary | ICD-10-CM

## 2020-12-13 DIAGNOSIS — T148XXA Other injury of unspecified body region, initial encounter: Secondary | ICD-10-CM

## 2020-12-13 DIAGNOSIS — K661 Hemoperitoneum: Secondary | ICD-10-CM | POA: Diagnosis present

## 2020-12-13 DIAGNOSIS — Z87891 Personal history of nicotine dependence: Secondary | ICD-10-CM | POA: Diagnosis not present

## 2020-12-13 DIAGNOSIS — S2242XA Multiple fractures of ribs, left side, initial encounter for closed fracture: Secondary | ICD-10-CM | POA: Diagnosis present

## 2020-12-13 DIAGNOSIS — Z419 Encounter for procedure for purposes other than remedying health state, unspecified: Secondary | ICD-10-CM

## 2020-12-13 DIAGNOSIS — Z8042 Family history of malignant neoplasm of prostate: Secondary | ICD-10-CM

## 2020-12-13 DIAGNOSIS — S3600XA Unspecified injury of spleen, initial encounter: Secondary | ICD-10-CM

## 2020-12-13 DIAGNOSIS — S36039A Unspecified laceration of spleen, initial encounter: Principal | ICD-10-CM | POA: Diagnosis present

## 2020-12-13 DIAGNOSIS — J4540 Moderate persistent asthma, uncomplicated: Secondary | ICD-10-CM | POA: Diagnosis present

## 2020-12-13 DIAGNOSIS — R000 Tachycardia, unspecified: Secondary | ICD-10-CM | POA: Diagnosis present

## 2020-12-13 DIAGNOSIS — Z825 Family history of asthma and other chronic lower respiratory diseases: Secondary | ICD-10-CM | POA: Diagnosis not present

## 2020-12-13 DIAGNOSIS — S27809A Unspecified injury of diaphragm, initial encounter: Secondary | ICD-10-CM

## 2020-12-13 DIAGNOSIS — Z818 Family history of other mental and behavioral disorders: Secondary | ICD-10-CM | POA: Diagnosis not present

## 2020-12-13 DIAGNOSIS — Z86010 Personal history of colonic polyps: Secondary | ICD-10-CM | POA: Diagnosis not present

## 2020-12-13 DIAGNOSIS — Z6835 Body mass index (BMI) 35.0-35.9, adult: Secondary | ICD-10-CM

## 2020-12-13 DIAGNOSIS — Z79899 Other long term (current) drug therapy: Secondary | ICD-10-CM

## 2020-12-13 DIAGNOSIS — I959 Hypotension, unspecified: Secondary | ICD-10-CM | POA: Diagnosis present

## 2020-12-13 DIAGNOSIS — Z833 Family history of diabetes mellitus: Secondary | ICD-10-CM | POA: Diagnosis not present

## 2020-12-13 DIAGNOSIS — W010XXA Fall on same level from slipping, tripping and stumbling without subsequent striking against object, initial encounter: Secondary | ICD-10-CM | POA: Diagnosis present

## 2020-12-13 DIAGNOSIS — R112 Nausea with vomiting, unspecified: Secondary | ICD-10-CM | POA: Diagnosis not present

## 2020-12-13 DIAGNOSIS — Z20822 Contact with and (suspected) exposure to covid-19: Secondary | ICD-10-CM | POA: Diagnosis present

## 2020-12-13 DIAGNOSIS — Z800 Family history of malignant neoplasm of digestive organs: Secondary | ICD-10-CM

## 2020-12-13 DIAGNOSIS — S42022A Displaced fracture of shaft of left clavicle, initial encounter for closed fracture: Secondary | ICD-10-CM | POA: Diagnosis present

## 2020-12-13 DIAGNOSIS — E669 Obesity, unspecified: Secondary | ICD-10-CM | POA: Diagnosis present

## 2020-12-13 DIAGNOSIS — Z8614 Personal history of Methicillin resistant Staphylococcus aureus infection: Secondary | ICD-10-CM

## 2020-12-13 DIAGNOSIS — Z8616 Personal history of COVID-19: Secondary | ICD-10-CM | POA: Diagnosis not present

## 2020-12-13 DIAGNOSIS — D62 Acute posthemorrhagic anemia: Secondary | ICD-10-CM | POA: Diagnosis not present

## 2020-12-13 DIAGNOSIS — K219 Gastro-esophageal reflux disease without esophagitis: Secondary | ICD-10-CM | POA: Diagnosis present

## 2020-12-13 DIAGNOSIS — Y990 Civilian activity done for income or pay: Secondary | ICD-10-CM | POA: Diagnosis not present

## 2020-12-13 DIAGNOSIS — N179 Acute kidney failure, unspecified: Secondary | ICD-10-CM | POA: Diagnosis present

## 2020-12-13 DIAGNOSIS — S42009A Fracture of unspecified part of unspecified clavicle, initial encounter for closed fracture: Secondary | ICD-10-CM | POA: Diagnosis present

## 2020-12-13 DIAGNOSIS — Z87442 Personal history of urinary calculi: Secondary | ICD-10-CM

## 2020-12-13 DIAGNOSIS — R55 Syncope and collapse: Secondary | ICD-10-CM | POA: Diagnosis present

## 2020-12-13 DIAGNOSIS — E739 Lactose intolerance, unspecified: Secondary | ICD-10-CM | POA: Diagnosis present

## 2020-12-13 LAB — CBC WITH DIFFERENTIAL/PLATELET
Abs Immature Granulocytes: 0.05 10*3/uL (ref 0.00–0.07)
Basophils Absolute: 0 10*3/uL (ref 0.0–0.1)
Basophils Relative: 0 %
Eosinophils Absolute: 0 10*3/uL (ref 0.0–0.5)
Eosinophils Relative: 0 %
HCT: 38 % — ABNORMAL LOW (ref 39.0–52.0)
Hemoglobin: 12.2 g/dL — ABNORMAL LOW (ref 13.0–17.0)
Immature Granulocytes: 1 %
Lymphocytes Relative: 14 %
Lymphs Abs: 1.4 10*3/uL (ref 0.7–4.0)
MCH: 29.5 pg (ref 26.0–34.0)
MCHC: 32.1 g/dL (ref 30.0–36.0)
MCV: 91.8 fL (ref 80.0–100.0)
Monocytes Absolute: 0.8 10*3/uL (ref 0.1–1.0)
Monocytes Relative: 8 %
Neutro Abs: 7.2 10*3/uL (ref 1.7–7.7)
Neutrophils Relative %: 77 %
Platelets: 179 10*3/uL (ref 150–400)
RBC: 4.14 MIL/uL — ABNORMAL LOW (ref 4.22–5.81)
RDW: 12.9 % (ref 11.5–15.5)
WBC: 9.5 10*3/uL (ref 4.0–10.5)
nRBC: 0 % (ref 0.0–0.2)

## 2020-12-13 LAB — ABO/RH: ABO/RH(D): A POS

## 2020-12-13 LAB — URINALYSIS, ROUTINE W REFLEX MICROSCOPIC
Bacteria, UA: NONE SEEN
Glucose, UA: NEGATIVE mg/dL
Hgb urine dipstick: NEGATIVE
Ketones, ur: 5 mg/dL — AB
Leukocytes,Ua: NEGATIVE
Nitrite: NEGATIVE
Protein, ur: 30 mg/dL — AB
Specific Gravity, Urine: 1.029 (ref 1.005–1.030)
pH: 5 (ref 5.0–8.0)

## 2020-12-13 LAB — COMPREHENSIVE METABOLIC PANEL
ALT: 35 U/L (ref 0–44)
AST: 46 U/L — ABNORMAL HIGH (ref 15–41)
Albumin: 3.7 g/dL (ref 3.5–5.0)
Alkaline Phosphatase: 59 U/L (ref 38–126)
Anion gap: 9 (ref 5–15)
BUN: 22 mg/dL — ABNORMAL HIGH (ref 6–20)
CO2: 23 mmol/L (ref 22–32)
Calcium: 8.5 mg/dL — ABNORMAL LOW (ref 8.9–10.3)
Chloride: 102 mmol/L (ref 98–111)
Creatinine, Ser: 1.26 mg/dL — ABNORMAL HIGH (ref 0.61–1.24)
GFR, Estimated: >60 mL/min (ref >60)
Glucose, Bld: 209 mg/dL — ABNORMAL HIGH (ref 70–99)
Potassium: 4.4 mmol/L (ref 3.5–5.1)
Sodium: 134 mmol/L — ABNORMAL LOW (ref 135–145)
Total Bilirubin: 0.7 mg/dL (ref 0.3–1.2)
Total Protein: 6.5 g/dL (ref 6.5–8.1)

## 2020-12-13 LAB — CBC
HCT: 34.7 % — ABNORMAL LOW (ref 39.0–52.0)
HCT: 31.3 % — ABNORMAL LOW (ref 39.0–52.0)
Hemoglobin: 11.3 g/dL — ABNORMAL LOW (ref 13.0–17.0)
Hemoglobin: 10.2 g/dL — ABNORMAL LOW (ref 13.0–17.0)
MCH: 30.3 pg (ref 26.0–34.0)
MCH: 29.7 pg (ref 26.0–34.0)
MCHC: 32.6 g/dL (ref 30.0–36.0)
MCHC: 32.6 g/dL (ref 30.0–36.0)
MCV: 93 fL (ref 80.0–100.0)
MCV: 91.3 fL (ref 80.0–100.0)
Platelets: 179 10*3/uL (ref 150–400)
Platelets: 182 10*3/uL (ref 150–400)
RBC: 3.73 MIL/uL — ABNORMAL LOW (ref 4.22–5.81)
RBC: 3.43 MIL/uL — ABNORMAL LOW (ref 4.22–5.81)
RDW: 12.9 % (ref 11.5–15.5)
RDW: 12.9 % (ref 11.5–15.5)
WBC: 10 10*3/uL (ref 4.0–10.5)
WBC: 9.6 10*3/uL (ref 4.0–10.5)
nRBC: 0 % (ref 0.0–0.2)
nRBC: 0 % (ref 0.0–0.2)

## 2020-12-13 LAB — RESP PANEL BY RT-PCR (FLU A&B, COVID) ARPGX2
Influenza A by PCR: NEGATIVE
Influenza B by PCR: NEGATIVE
SARS Coronavirus 2 by RT PCR: NEGATIVE

## 2020-12-13 LAB — TYPE AND SCREEN
ABO/RH(D): A POS
Antibody Screen: NEGATIVE

## 2020-12-13 LAB — HIV ANTIBODY (ROUTINE TESTING W REFLEX): HIV Screen 4th Generation wRfx: NONREACTIVE

## 2020-12-13 LAB — PROTIME-INR
INR: 1.3 — ABNORMAL HIGH (ref 0.8–1.2)
Prothrombin Time: 15.7 seconds — ABNORMAL HIGH (ref 11.4–15.2)

## 2020-12-13 LAB — LACTIC ACID, PLASMA
Lactic Acid, Venous: 6.3 mmol/L (ref 0.5–1.9)
Lactic Acid, Venous: 6.8 mmol/L (ref 0.5–1.9)

## 2020-12-13 LAB — TROPONIN I (HIGH SENSITIVITY)
Troponin I (High Sensitivity): <2 ng/L (ref <18)
Troponin I (High Sensitivity): 5 ng/L (ref <18)

## 2020-12-13 LAB — APTT: aPTT: 28 seconds (ref 24–36)

## 2020-12-13 LAB — MRSA PCR SCREENING: MRSA by PCR: NEGATIVE

## 2020-12-13 MED ORDER — FENTANYL CITRATE (PF) 100 MCG/2ML IJ SOLN
50.0000 ug | Freq: Once | INTRAMUSCULAR | Status: AC
Start: 2020-12-13 — End: 2020-12-13
  Administered 2020-12-13: 50 ug via INTRAVENOUS
  Filled 2020-12-13: qty 2

## 2020-12-13 MED ORDER — OXYCODONE HCL 5 MG PO TABS
5.0000 mg | ORAL_TABLET | ORAL | Status: DC | PRN
  Administered 2020-12-13 (×2): 5 mg via ORAL
  Administered 2020-12-15 – 2020-12-20 (×15): 10 mg via ORAL
  Filled 2020-12-13: qty 1
  Filled 2020-12-13 (×5): qty 2
  Filled 2020-12-13: qty 1
  Filled 2020-12-13 (×10): qty 2

## 2020-12-13 MED ORDER — FENTANYL CITRATE (PF) 100 MCG/2ML IJ SOLN
50.0000 ug | Freq: Once | INTRAMUSCULAR | Status: AC
  Administered 2020-12-13: 50 ug via INTRAVENOUS
  Filled 2020-12-13: qty 2

## 2020-12-13 MED ORDER — PANTOPRAZOLE SODIUM 40 MG IV SOLR: 40.0000 mg | Freq: Every day | INTRAVENOUS | Status: DC

## 2020-12-13 MED ORDER — CHLORHEXIDINE GLUCONATE CLOTH 2 % EX PADS
6.0000 | MEDICATED_PAD | Freq: Every day | CUTANEOUS | Status: DC
  Administered 2020-12-13 – 2020-12-20 (×6): 6 via TOPICAL

## 2020-12-13 MED ORDER — ACETAMINOPHEN 500 MG PO TABS
1000.0000 mg | ORAL_TABLET | Freq: Four times a day (QID) | ORAL | Status: DC
  Administered 2020-12-13 – 2020-12-20 (×19): 1000 mg via ORAL
  Filled 2020-12-13 (×21): qty 2

## 2020-12-13 MED ORDER — FLUTICASONE PROPIONATE HFA 110 MCG/ACT IN AERO: 1.0000 | INHALATION_SPRAY | Freq: Two times a day (BID) | RESPIRATORY_TRACT | Status: DC

## 2020-12-13 MED ORDER — LACTATED RINGERS IV SOLN: INTRAVENOUS | Status: DC

## 2020-12-13 MED ORDER — PANTOPRAZOLE SODIUM 40 MG PO TBEC: 40.0000 mg | DELAYED_RELEASE_TABLET | Freq: Every day | ORAL | Status: DC

## 2020-12-13 MED ORDER — BUDESONIDE 0.25 MG/2ML IN SUSP
0.2500 mg | Freq: Two times a day (BID) | RESPIRATORY_TRACT | Status: DC
  Administered 2020-12-14 – 2020-12-19 (×7): 0.25 mg via RESPIRATORY_TRACT
  Filled 2020-12-13 (×13): qty 2

## 2020-12-13 MED ORDER — KETOROLAC TROMETHAMINE 30 MG/ML IJ SOLN
15.0000 mg | Freq: Once | INTRAMUSCULAR | Status: AC
  Administered 2020-12-13: 15 mg via INTRAVENOUS
  Filled 2020-12-13: qty 1

## 2020-12-13 MED ORDER — SODIUM CHLORIDE 0.9 % IV BOLUS
1000.0000 mL | Freq: Once | INTRAVENOUS | Status: AC
  Administered 2020-12-13: 1000 mL via INTRAVENOUS

## 2020-12-13 MED ORDER — ALBUTEROL SULFATE HFA 108 (90 BASE) MCG/ACT IN AERS: 2.0000 | INHALATION_SPRAY | Freq: Four times a day (QID) | RESPIRATORY_TRACT | Status: DC | PRN

## 2020-12-13 MED ORDER — MORPHINE SULFATE (PF) 2 MG/ML IV SOLN: 2.0000 mg | INTRAVENOUS | Status: DC | PRN

## 2020-12-13 MED ORDER — IOHEXOL 350 MG/ML SOLN
100.0000 mL | Freq: Once | INTRAVENOUS | Status: AC | PRN
  Administered 2020-12-13: 100 mL via INTRAVENOUS

## 2020-12-13 MED ORDER — PROCHLORPERAZINE EDISYLATE 10 MG/2ML IJ SOLN
10.0000 mg | Freq: Four times a day (QID) | INTRAMUSCULAR | Status: DC | PRN
  Administered 2020-12-13 – 2020-12-14 (×4): 10 mg via INTRAVENOUS
  Filled 2020-12-13 (×4): qty 2

## 2020-12-13 MED ORDER — METHOCARBAMOL 500 MG PO TABS
1000.0000 mg | ORAL_TABLET | Freq: Three times a day (TID) | ORAL | Status: DC
  Administered 2020-12-13: 1000 mg via ORAL
  Filled 2020-12-13 (×2): qty 2

## 2020-12-13 MED ORDER — HYDROMORPHONE HCL 1 MG/ML IJ SOLN
0.5000 mg | INTRAMUSCULAR | Status: DC | PRN
  Administered 2020-12-13 – 2020-12-20 (×17): 1 mg via INTRAVENOUS
  Filled 2020-12-13 (×17): qty 1

## 2020-12-13 MED ORDER — SODIUM CHLORIDE (PF) 0.9 % IJ SOLN
INTRAMUSCULAR | Status: AC
  Filled 2020-12-13: qty 50

## 2020-12-13 MED ORDER — SODIUM CHLORIDE 0.9 % IV SOLN
12.5000 mg | Freq: Four times a day (QID) | INTRAVENOUS | Status: DC | PRN
  Filled 2020-12-13: qty 0.5

## 2020-12-13 MED ORDER — ONDANSETRON 4 MG PO TBDP: 4.0000 mg | ORAL_TABLET | Freq: Four times a day (QID) | ORAL | Status: DC | PRN

## 2020-12-13 MED ORDER — OXYCODONE HCL 5 MG PO TABS: 5.0000 mg | ORAL_TABLET | Freq: Four times a day (QID) | ORAL | Status: DC | PRN

## 2020-12-13 MED ORDER — KCL IN DEXTROSE-NACL 20-5-0.9 MEQ/L-%-% IV SOLN
INTRAVENOUS | Status: DC
  Filled 2020-12-13: qty 1000

## 2020-12-13 MED ORDER — ONDANSETRON HCL 4 MG/2ML IJ SOLN
4.0000 mg | Freq: Once | INTRAMUSCULAR | Status: AC
  Administered 2020-12-13: 4 mg via INTRAVENOUS
  Filled 2020-12-13: qty 2

## 2020-12-13 MED ORDER — BISACODYL 10 MG RE SUPP
10.0000 mg | Freq: Every day | RECTAL | Status: DC | PRN
  Administered 2020-12-13 – 2020-12-16 (×2): 10 mg via RECTAL
  Filled 2020-12-13 (×2): qty 1

## 2020-12-13 MED ORDER — ALBUTEROL SULFATE (2.5 MG/3ML) 0.083% IN NEBU
2.5000 mg | INHALATION_SOLUTION | Freq: Four times a day (QID) | RESPIRATORY_TRACT | Status: DC | PRN
  Administered 2020-12-14 – 2020-12-19 (×3): 2.5 mg via RESPIRATORY_TRACT
  Filled 2020-12-13 (×4): qty 3

## 2020-12-13 MED ORDER — PANTOPRAZOLE SODIUM 40 MG IV SOLR
40.0000 mg | Freq: Every day | INTRAVENOUS | Status: DC
  Administered 2020-12-13 – 2020-12-17 (×5): 40 mg via INTRAVENOUS
  Filled 2020-12-13 (×5): qty 40

## 2020-12-13 MED ORDER — ONDANSETRON HCL 4 MG/2ML IJ SOLN
4.0000 mg | Freq: Four times a day (QID) | INTRAMUSCULAR | Status: DC | PRN
  Administered 2020-12-13 – 2020-12-18 (×7): 4 mg via INTRAVENOUS
  Filled 2020-12-13 (×7): qty 2

## 2020-12-13 MED ORDER — MORPHINE SULFATE (PF) 2 MG/ML IV SOLN: 1.0000 mg | INTRAVENOUS | Status: DC | PRN

## 2020-12-13 MED ORDER — OXYCODONE HCL 5 MG/5ML PO SOLN: 5.0000 mg | ORAL | Status: DC | PRN

## 2020-12-13 NOTE — ED Notes (Signed)
[facility] called for transport. 

## 2020-12-13 NOTE — ED Notes (Signed)
[facility] here to transport [facility] to [facility]

## 2020-12-13 NOTE — ED Notes (Signed)
Trauma M.d page for lactic acid of 6.3. Waiting for the call back.

## 2020-12-13 NOTE — ED Notes (Signed)
ED TO INPATIENT HANDOFF REPORT  ED Nurse Name and Phone #: [phone]  S Name/Age/Gender Clarence Alvarez 55 y.o. male Room/Bed: WA13/WA13  Code Status   Code Status: Not on file  Home/SNF/Other Home Patient oriented to: self, place, time and situation Is this baseline? Yes   Triage Complete: Triage complete  Chief Complaint Spleen laceration [W96.759F]  Triage Note Pt came in with c/o syncope and L rib pain. Pt broke his clavicle today from a fall at work and is scheduled for surgery on Wed. Pt was seen at [facility] and [facility]. He was given 10mg  of oxycodone and 5mg  of Flexeril. Pt got home, got up and went to change thermostat temp when he passed out. Pt is also c/o L rib injury. Pt also passed out when EMS got there. EMS states that pt's radial pulses disappeared but he recovered quickly    Allergies Allergies  Allergen Reactions  . Lactose Intolerance (Gi)     Gas and bloating  . Latex     Skin irritation dsicoloration/blisters    Level of Care/Admitting Diagnosis ED Disposition    ED Disposition Condition [facility] Area: [place] [100100] Level of Care: ICU [6] May admit patient to [facility] or [facility] if equivalent level of care is available:: No Covid Evaluation: Asymptomatic Screening Protocol (No Symptoms) Diagnosis: Sp leen laceration [638466] Admitting Physician: TRAUMA MD [[id number]] Attending Physician: TRAUMA MD [[id number]] Estimated length of stay: 5 - 7 days Certification:: I certify this patient will need inpatient services for at least 2 midnights       B Medical/Surgery History Past Medical History:  Diagnosis Date  . Allergic rhinitis, seasonal   . Anterior displaced fracture of sternal end of left clavicle, initial encounter for closed fracture 12/12/2020  . Arthritis    right knee and both shoulders and bursisits  . Concussion age 46   no residual  . GERD (gastroesophageal reflux disease)   . History of  adenomatous polyp of colon    03-25-2014  tubular adenoma's /  hyperplastic  . History of hiatal hernia   . History of kidney stones 10-23-16   passed on own  . Hypertension   . Left ureteral stone    2016-10-23  . Mild obstructive sleep apnea    per study Oct 23, 2012, pt states he does not have sleep apnea  . Moderate persistent asthma    followed by pcp-- uncomplicated  . MRSA infection 10/23/2008   buttocks  . Pneumonia x 3 last time October 23, 2009  . Pre-diabetes   . Sleep apnea    no CPAP used  . Traumatic complete tear of right rotator cuff 11/30/2018  . Wears dentures    full  . Wears glasses    Past Surgical History:  Procedure Laterality Date  . [place] STUDY N/A 05/04/2018   Procedure: [place] STUDY;  Surgeon: Jonathon Bellows, MD;  Location: [facility] ENDOSCOPY;  Service: Gastroenterology;  Laterality: N/A;  . anterior cervucal  10/24/2015   with fusion  . COLONOSCOPY  03/25/2014 , 10-23-2017  . ESOPHAGEAL MANOMETRY N/A 05/04/2018   Procedure: ESOPHAGEAL MANOMETRY (EM);  Surgeon: Jonathon Bellows, MD;  Location: [facility] ENDOSCOPY;  Service: Gastroenterology;  Laterality: N/A;  . ESOPHAGOGASTRODUODENOSCOPY (EGD) WITH PROPOFOL N/A 02/24/2018   Procedure: ESOPHAGOGASTRODUODENOSCOPY (EGD) WITH PROPOFOL;  Surgeon: Jonathon Bellows, MD;  Location: [facility] ENDOSCOPY;  Service: Gastroenterology;  Laterality: N/A;  . KNEE SURGERY Right 23-Oct-2013  . RESECTION DISTAL CLAVICAL Right  12/06/2018   Procedure: RESECTION DISTAL CLAVICAL AND DEBRIDEMENT OF THE SHOULDER;  Surgeon: Elsie Saas, MD;  Location: [place];  Service: Orthopedics;  Laterality: Right;  SCALENE BLOCK  . right thumb surgery  2019   trigger finger release  . SHOULDER ARTHROSCOPY WITH ROTATOR CUFF REPAIR AND SUBACROMIAL DECOMPRESSION Left 07-24-2010  dr Noemi Chapel @  [facility]   and Labrum tear debridement  . SHOULDER ARTHROSCOPY WITH ROTATOR CUFF REPAIR AND SUBACROMIAL DECOMPRESSION Right 12/06/2018   Procedure: SHOULDER ARTHROSCOPY WITH ROTATOR CUFF REPAIR AND  SUBACROMIAL DECOMPRESSION;  Surgeon: Elsie Saas, MD;  Location: [place];  Service: Orthopedics;  Laterality: Right;  SCALENE BLOCK  . UPPER GASTROINTESTINAL ENDOSCOPY  2019     A IV Location/Drains/Wounds Patient Lines/Drains/Airways Status    Active Line/Drains/Airways    Name Placement date Placement time Site Days   Peripheral IV 12/13/20 20 G Anterior;Distal;Right Forearm 12/13/20  0059  Forearm  less than 1   Peripheral IV 12/13/20 20 G Right Antecubital 12/13/20  0452  Antecubital  less than 1   Airway 8 mm 12/06/18  1104  -- 738   Incision (Closed) 12/06/18 Shoulder Right 12/06/18  1232  -- 738          Intake/Output Last 24 hours No intake or output data in the 24 hours ending 12/13/20 0728  Labs/Imaging Results for orders placed or performed during the hospital encounter of 12/13/20 (from the past 48 hour(s))  CBC with Differential     Status: Abnormal   Collection Time: 12/13/20  2:21 AM  Result Value Ref Range   WBC 9.5 4.0 - 10.5 K/uL   RBC 4.14 (L) 4.22 - 5.81 MIL/uL   Hemoglobin 12.2 (L) 13.0 - 17.0 g/dL   HCT 38.0 (L) 39.0 - 52.0 %   MCV 91.8 80.0 - 100.0 fL   MCH 29.5 26.0 - 34.0 pg   MCHC 32.1 30.0 - 36.0 g/dL   RDW 12.9 11.5 - 15.5 %   Platelets 179 150 - 400 K/uL   nRBC 0.0 0.0 - 0.2 %   Neutrophils Relative % 77 %   Neutro Abs 7.2 1.7 - 7.7 K/uL   Lymphocytes Relative 14 %   Lymphs Abs 1.4 0.7 - 4.0 K/uL   Monocytes Relative 8 %   Monocytes Absolute 0.8 0.1 - 1.0 K/uL   Eosinophils Relative 0 %   Eosinophils Absolute 0.0 0.0 - 0.5 K/uL   Basophils Relative 0 %   Basophils Absolute 0.0 0.0 - 0.1 K/uL   Immature Granulocytes 1 %   Abs Immature Granulocytes 0.05 0.00 - 0.07 K/uL    Comment: Performed at [facility], [place] [address], [place], [place] [address]  Comprehensive metabolic panel     Status: Abnormal   Collection Time: 12/13/20  2:21 AM  Result Value Ref Range   Sodium 134 (L) 135 - 145 mmol/L    Potassium 4.4 3.5 - 5.1 mmol/L   Chloride 102 98 - 111 mmol/L   CO2 23 22 - 32 mmol/L   Glucose, Bld 209 (H) 70 - 99 mg/dL    Comment: Glucose reference range applies only to samples taken after fasting for at least 8 hours.   BUN 22 (H) 6 - 20 mg/dL   Creatinine, Ser 1.26 (H) 0.61 - 1.24 mg/dL   Calcium 8.5 (L) 8.9 - 10.3 mg/dL   Total Protein 6.5 6.5 - 8.1 g/dL   Albumin 3.7 3.5 - 5.0 g/dL   AST  46 (H) 15 - 41 U/L   ALT 35 0 - 44 U/L   Alkaline Phosphatase 59 38 - 126 U/L   Total Bilirubin 0.7 0.3 - 1.2 mg/dL   GFR, Estimated >60 >60 mL/min    Comment: (NOTE) Calculated using the CKD-EPI Creatinine Equation (2021)    Anion gap 9 5 - 15    Comment: Performed at [facility], [place] [address] [place], [place] [address]  Urinalysis, Routine w reflex microscopic Urine, Random     Status: Abnormal   Collection Time: 12/13/20  5:51 AM  Result Value Ref Range   Color, Urine AMBER (A) YELLOW    Comment: BIOCHEMICALS MAY BE AFFECTED BY COLOR   APPearance CLEAR CLEAR   Specific Gravity, Urine 1.029 1.005 - 1.030   pH 5.0 5.0 - 8.0   Glucose, UA NEGATIVE NEGATIVE mg/dL   Hgb urine dipstick NEGATIVE NEGATIVE   Bilirubin Urine SMALL (A) NEGATIVE   Ketones, ur 5 (A) NEGATIVE mg/dL   Protein, ur 30 (A) NEGATIVE mg/dL   Nitrite NEGATIVE NEGATIVE   Leukocytes,Ua NEGATIVE NEGATIVE   RBC / HPF 0-5 0 - 5 RBC/hpf   WBC, UA 6-10 0 - 5 WBC/hpf   Bacteria, UA NONE SEEN NONE SEEN   Squamous Epithelial / LPF 0-5 0 - 5   Mucus PRESENT    Hyaline Casts, UA PRESENT     Comment: Performed at [facility], [place] [address] [place], [place] [address]  Troponin I (High Sensitivity)     Status: None   Collection Time: 12/13/20  5:55 AM  Result Value Ref Range   Troponin I (High Sensitivity) <2 <18 ng/L    Comment: (NOTE) Elevated high sensitivity troponin I (hsTnI) values and significant  changes across serial measurements may suggest ACS but many other  chronic  and acute conditions are known to elevate hsTnI results.  Refer to the "Links" section for chest pain algorithms and additional  guidance. Performed at [facility], [place] [address], [place], [place] [address]   Type and screen [place]     Status: None (Preliminary result)   Collection Time: 12/13/20  6:30 AM  Result Value Ref Range   ABO/RH(D) PENDING    Antibody Screen PENDING    Sample Expiration      12/16/2020,2359 Performed at [facility], [place] [address] [place], [place] [address]   APTT     Status: None   Collection Time: 12/13/20  6:30 AM  Result Value Ref Range   aPTT 28 24 - 36 seconds    Comment: Performed at [facility], [place] [address] [place], [place] [address]  Protime-INR     Status: Abnormal   Collection Time: 12/13/20  6:30 AM  Result Value Ref Range   Prothrombin Time 15.7 (H) 11.4 - 15.2 seconds   INR 1.3 (H) 0.8 - 1.2    Comment: (NOTE) INR goal varies based on device and disease states. Performed at [facility], [place] [address], [place], [place] [address]   Lactic acid, plasma     Status: Abnormal   Collection Time: 12/13/20  6:30 AM  Result Value Ref Range   Lactic Acid, Venous 6.3 (HH) 0.5 - 1.9 mmol/L    Comment: CRITICAL RESULT CALLED TO, READ BACK BY AND VERIFIED WITH: GARRISON,G. RN AT [phone] 12/13/20 MULLINS,T Performed at [facility], [place] [address], [place], [place] [address]  DG Chest 2 View  Result Date: 12/13/2020 CLINICAL DATA:  Pain. Status post fall at work. Recent clavicle fracture. EXAM: CHEST - 2 VIEW COMPARISON:  X-ray clavicle 12/12/2020, chest x-ray 08/14/2017 FINDINGS: The heart size and mediastinal contours are within normal limits. Bibasilar streaky airspace opacities likely atelectasis. No focal consolidation. Limited evaluation on lateral view due to overlying arm. No pulmonary edema. No pleural effusion. No  pneumothorax. Redemonstration of acute comminuted proximal to mid left clavicular fracture. Cervical surgical hardware. IMPRESSION: 1. No definite active cardiopulmonary disease with markedly limited evaluation on lateral view due to overlying arm. 2. Acute comminuted proximal to mid left clavicular fracture. Electronically Signed   By: Iven Finn M.D.   On: 12/13/2020 02:23   DG Clavicle Left  Result Date: 12/12/2020 CLINICAL DATA:  55 year old male status post fall with pain. EXAM: LEFT CLAVICLE - 2+ VIEWS COMPARISON:  Left shoulder series today reported separately. Chest radiographs 08/14/2017. FINDINGS: Chronic lower cervical ACDF. Comminuted fracture of the left midclavicle with mildly displaced butterfly fragments, over riding, 1/2 shaft width inferior displacement, and inferior angulation of the distal fragment. The acromioclavicular and coracoclavicular intervals appear to remain normal. Negative visible left lung apex. IMPRESSION: Comminuted left midclavicle fracture. Electronically Signed   By: Genevie Ann M.D.   On: 12/12/2020 09:51   CT Head Wo Contrast  Result Date: 12/13/2020 CLINICAL DATA:  Syncope.  Left rib pain. EXAM: CT HEAD WITHOUT CONTRAST CT CERVICAL SPINE WITHOUT CONTRAST TECHNIQUE: Multidetector CT imaging of the head and cervical spine was performed following the standard protocol without intravenous contrast. Multiplanar CT image reconstructions of the cervical spine were also generated. COMPARISON:  CT head 03/15/2018 FINDINGS: CT HEAD FINDINGS Brain: No evidence of large-territorial acute infarction. No parenchymal hemorrhage. No mass lesion. No extra-axial collection. No mass effect or midline shift. No hydrocephalus. Basilar cisterns are patent. Vascular: No hyperdense vessel. Atherosclerotic calcifications are present within the cavernous internal carotid arteries. Skull: No acute fracture or focal lesion. Sinuses/Orbits: Paranasal sinuses and mastoid air cells are clear. The  orbits are unremarkable. Other: None. CT CERVICAL SPINE FINDINGS Alignment: Normal. Skull base and vertebrae: Anterior and interbody fusion of the C7-T1 levels. Multilevel degenerative changes of the spine most prominent at the C5-C6 level. No acute fracture. No aggressive appearing focal osseous lesion or focal pathologic process. Soft tissues and spinal canal: No prevertebral fluid or swelling. No visible canal hematoma. Upper chest: No definite pneumothorax. Other: Nondisplaced acute fracture of the left posterior second rib (7:33, 6:97, 8:48). Question nondisplaced acute fracture of the posterior left third rib (8:57, 7:42). IMPRESSION: 1. Nondisplaced acute fracture of the left posterior second rib. Question nondisplaced acute fracture of the posterior left third rib. No definite associated pneumothorax. 2.  No acute intracranial abnormality. 3. No acute displaced fracture or traumatic listhesis of the cervical spine. Electronically Signed   By: Iven Finn M.D.   On: 12/13/2020 02:34   CT Cervical Spine Wo Contrast  Result Date: 12/13/2020 CLINICAL DATA:  Syncope.  Left rib pain. EXAM: CT HEAD WITHOUT CONTRAST CT CERVICAL SPINE WITHOUT CONTRAST TECHNIQUE: Multidetector CT imaging of the head and cervical spine was performed following the standard protocol without intravenous contrast. Multiplanar CT image reconstructions of the cervical spine were also generated. COMPARISON:  CT head 03/15/2018 FINDINGS: CT HEAD FINDINGS Brain: No evidence of large-territorial acute infarction. No parenchymal hemorrhage. No mass lesion. No extra-axial collection. No mass effect or midline shift. No hydrocephalus. Basilar cisterns are patent. Vascular: No hyperdense vessel.  Atherosclerotic calcifications are present within the cavernous internal carotid arteries. Skull: No acute fracture or focal lesion. Sinuses/Orbits: Paranasal sinuses and mastoid air cells are clear. The orbits are unremarkable. Other: None. CT  CERVICAL SPINE FINDINGS Alignment: Normal. Skull base and vertebrae: Anterior and interbody fusion of the C7-T1 levels. Multilevel degenerative changes of the spine most prominent at the C5-C6 level. No acute fracture. No aggressive appearing focal osseous lesion or focal pathologic process. Soft tissues and spinal canal: No prevertebral fluid or swelling. No visible canal hematoma. Upper chest: No definite pneumothorax. Other: Nondisplaced acute fracture of the left posterior second rib (7:33, 6:97, 8:48). Question nondisplaced acute fracture of the posterior left third rib (8:57, 7:42). IMPRESSION: 1. Nondisplaced acute fracture of the left posterior second rib. Question nondisplaced acute fracture of the posterior left third rib. No definite associated pneumothorax. 2.  No acute intracranial abnormality. 3. No acute displaced fracture or traumatic listhesis of the cervical spine. Electronically Signed   By: Iven Finn M.D.   On: 12/13/2020 02:34   DG Shoulder Left  Result Date: 12/12/2020 CLINICAL DATA:  Fall.  Shoulder pain EXAM: LEFT SHOULDER - 2+ VIEW COMPARISON:  None FINDINGS: Midshaft LEFT clavicle fracture with caudal angulation. Mild comminution at the fracture plane. LEFT shoulder joint tact.  Anterior cervical fusion noted IMPRESSION: Midshaft LEFT clavicle fracture. Electronically Signed   By: Suzy Bouchard M.D.   On: 12/12/2020 09:53   CT Angio Chest/Abd/Pel for Dissection W and/or Wo Contrast  Result Date: 12/13/2020 CLINICAL DATA:  c/o syncope and L rib pain. Pt broke his clavicle today from a fall at work and is scheduled for surgery on Wed EXAM: CT ANGIOGRAPHY CHEST, ABDOMEN AND PELVIS TECHNIQUE: Non-contrast CT of the chest was initially obtained. Multidetector CT imaging through the chest, abdomen and pelvis was performed using the standard protocol during bolus administration of intravenous contrast. Multiplanar reconstructed images and MIPs were obtained and reviewed to evaluate  the vascular anatomy. CONTRAST:  15mL OMNIPAQUE IOHEXOL 350 MG/ML SOLN COMPARISON:  CT abdomen pelvis 09/08/2017 FINDINGS: Markedly limited evaluation of the thorax and upper abdomen due to streak artifact originating from the left upper extremity along the patient's side. CTA CHEST FINDINGS Cardiovascular: Preferential opacification of the pulmonary artery with no pulmonary embolus. Normal heart size. No significant pericardial effusion. The thoracic aorta is normal in caliber. No definite thoracic aorta injury. No atherosclerotic plaque of the thoracic aorta. No coronary artery calcifications. Mediastinum/Nodes: No pneumomediastinum. No mediastinal hematoma. No enlarged mediastinal, hilar, or axillary lymph nodes. Thyroid gland, trachea, and esophagus demonstrate no significant findings. Lungs/Pleura: No focal consolidation. No pulmonary nodule. No pulmonary mass. Bilateral lower lobe atelectasis. No pulmonary contusion or laceration. Trace left pleural fluid. No definite left pneumothorax with a possible foci of gas along the pleura noted posteriorly (7:15). No right pleural fluid. No right pneumothorax. Musculoskeletal: Partially visualized comminuted mid to distal clavicular fracture. Acute fracture of the left posterior third nondisplaced, posterior fourth nondisplaced, posterior fifth nondisplaced, anterolateral fifth displaced, posterolateral sixth displaced, posterior sixth nondisplaced, posterolateral seventh displaced, posterior seventh nondisplaced, comminuted posterior eighth, posterolateral eighth minimally displaced, posterior ninth nondisplaced, posterior tenth minimally displaced. Review of the MIP images confirms the above findings. CTA ABDOMEN AND PELVIS FINDINGS VASCULAR Aorta: Normal caliber aorta without aneurysm, dissection, vasculitis or significant stenosis. Celiac: Patent without evidence of aneurysm, dissection, vasculitis or significant stenosis. SMA: Patent without evidence of aneurysm,  dissection, vasculitis or significant stenosis. Renals: Both renal arteries are patent without evidence of aneurysm, dissection,  vasculitis, fibromuscular dysplasia or significant stenosis. IMA: Patent without evidence of aneurysm, dissection, vasculitis or significant stenosis. Inflow: Patent without evidence of aneurysm, dissection, vasculitis or significant stenosis. Veins: No obvious venous abnormality within the limitations of this arterial phase study. Review of the MIP images confirms the above findings. NON-VASCULAR Diaphragm: Liely left hemidiaphragmatic injury. Hepatobiliary: No focal liver abnormality is seen. No gallstones, gallbladder wall thickening, or biliary dilatation. Markedly limited evaluation for hepatic injury due to timing of contrast. Pancreas: No focal lesion. Normal pancreatic contour. No surrounding inflammatory changes. No main pancreatic ductal dilatation. Spleen: The splenic parenchyma is not well visualized with poorly defined splenic borders. Markedly limited evaluation due to timing of contrast and streak artifact originating from the left upper extremity. Normal in size without focal abnormality. Adrenals/Urinary Tract: No adrenal nodule bilaterally. No nephrolithiasis, no hydronephrosis, and no contour-deforming renal mass. No ureterolithiasis or hydroureter. The urinary bladder is unremarkable. Stomach/Bowel: Stomach is within normal limits. No evidence of bowel wall thickening or dilatation. Appendix appears normal. Lymphatic: No lymphadenopathy. Reproductive: Prostate is unremarkable. Other: At least moderate volume hemoperitoneum with both perisplenic and perihepatic fluid noted. Question slightly higher density fluid surrounding the spleen. Perisplenic fat stranding. No intraperitoneal free gas. No organized fluid collection. Musculoskeletal: No abdominal wall hernia or abnormality. No suspicious lytic or blastic osseous lesions. No acute displaced fracture. Multilevel  degenerative changes of the spine. Review of the MIP images confirms the above findings. IMPRESSION: 1. Splenic injury with perisplenic hemorrhage and moderate volume hemoperitoneum. Possible shattered spleen with markedly limited evaluation due to timing of contrast and streak artifact originating from the left upper extremity. Unable to evaluate for active bleeding on this arterial phase only study of the abdomen/pelvis. 2. Trace left pleural fluid with limited evaluation due to streak artifact. No definite left pneumothorax with a possible foci of gas along the pleura posteriorly. 3. Associated multiple consecutive left rib fractures (4-10th) with several ribs broken in a couple of different places including the 5th, 6th, 7th, and 8th ribs. 4. Partially visualized comminuted mid to distal left clavicular fracture. 5. Likely left hemidiaphragmatic injury. 6. A hepatic injury cannot be excluded due to timing of contrast. These results were called by telephone at the time of interpretation on 12/13/2020 at 5:58 am to provider [facility] , who verbally acknowledged these results. Electronically Signed   By: Iven Finn M.D.   On: 12/13/2020 06:16    Pending Labs Unresulted Labs (From admission, onward)          Start     Ordered   12/13/20 0605  Resp Panel by RT-PCR (Flu A&B, Covid) Nasopharyngeal Swab  (Tier 2 - Symptomatic/asymptomatic with Precautions )  Once,   STAT       Question Answer Comment  Is this test for diagnosis or screening Screening   Symptomatic for COVID-19 as defined by CDC No   Hospitalized for COVID-19 No   Admitted to ICU for COVID-19 No   Previously tested for COVID-19 Yes   Resident in a congregate (group) care setting No   Employed in healthcare setting No   Has patient completed COVID vaccination(s) (2 doses of Pfizer/Moderna 1 dose of [employer]) Unknown      12/13/20 0604   12/13/20 0605  Lactic acid, plasma  Now then every 2 hours,   STAT      12/13/20  0604   Signed and Held  HIV Antibody (routine testing w rflx)  (HIV Antibody (Routine testing w  reflex) panel)  Once,   R        Signed and Held   Signed and Held  CBC  Now then every 6 hours,   R      Signed and Held   Signed and Held  Comprehensive metabolic panel  Tomorrow morning,   R        Signed and Held          Vitals/Pain Today's Vitals   12/13/20 0530 12/13/20 0545 12/13/20 0700 12/13/20 0715  BP: 112/77 115/64 108/69 110/74  Pulse: 98 (!) 102  (!) 108  Resp: (!) 25 (!) 23  (!) 32  Temp:      TempSrc:      SpO2: 97% 99%  99%  PainSc:   5      Isolation Precautions Airborne and Contact precautions  Medications Medications  sodium chloride (PF) 0.9 % injection (has no administration in time range)  promethazine (PHENERGAN) 12.5 mg in sodium chloride 0.9 % 50 mL IVPB (has no administration in time range)  sodium chloride 0.9 % bolus 1,000 mL (0 mLs Intravenous Stopped 12/13/20 0400)  ketorolac (TORADOL) 30 MG/ML injection 15 mg (15 mg Intravenous Given 12/13/20 0258)  sodium chloride 0.9 % bolus 1,000 mL (0 mLs Intravenous Stopped 12/13/20 0505)  iohexol (OMNIPAQUE) 350 MG/ML injection 100 mL (100 mLs Intravenous Contrast Given 12/13/20 0510)  fentaNYL (SUBLIMAZE) injection 50 mcg (50 mcg Intravenous Given 12/13/20 0637)  ondansetron (ZOFRAN) injection 4 mg (4 mg Intravenous Given 12/13/20 0637)    Mobility walks Moderate fall risk   Focused Assessments .   R Recommendations: See Admitting Provider Note  Report given to:   Additional Notes: n/a

## 2020-12-13 NOTE — ED Notes (Signed)
Pt. Documented in error see above note in chart. 

## 2020-12-13 NOTE — Progress Notes (Signed)
Critical Lab Result  Test: LA Critical Value: 6.8 Date and time results received: 12/13/20 0930 Name of Provider Notified: A. Bobbye Morton, MD  MD at bedside and aware.  Candy Sledge, RN

## 2020-12-13 NOTE — ED Notes (Signed)
Trauma surgeon at bedside.  

## 2020-12-13 NOTE — ED Provider Notes (Signed)
[facility] DEPT Provider Note   CSN: [id number] Arrival date & time: 12/13/20  0106     History Chief Complaint  Patient presents with  . Loss of Consciousness  . Rib Injury    Clarence Alvarez is a 55 y.o. male.  Patient to ED after syncopal episode at home tonight around 12:30. He was seen yesterday after a mechanical fall where he landed on his left side. He was evaluated and diagnosed with a comminuted clavicle fracture. He went to [facility] after the ED and surgery was arranged for next week. He then went home. He took a Percocet 5 mg at 3:30 and again at 11:30. Around 12:30 he got up from the couch and walked to the thermostat to check the temperature, got dizzy, flushed and woke up on the floor. He denies having chest pain or SOB. EMS was called who reports another syncopal episode while in route. Here, the patient reports persistent pain in the left lateral chest since the fall yesterday. He reports abdominal pain and distention that started tonight and that is better when he belches and passes gas, which he has been doing with increased frequency tonight.   The history is provided by the patient and the spouse. No language interpreter was used.       Past Medical History:  Diagnosis Date  . Allergic rhinitis, seasonal   . Anterior displaced fracture of sternal end of left clavicle, initial encounter for closed fracture 12/12/2020  . Arthritis    right knee and both shoulders and bursisits  . Concussion age 47   no residual  . GERD (gastroesophageal reflux disease)   . History of adenomatous polyp of colon    03-25-2014  tubular adenoma's /  hyperplastic  . History of hiatal hernia   . History of kidney stones 19-Oct-2016   passed on own  . Hypertension   . Left ureteral stone    10/19/16  . Mild obstructive sleep apnea    per study 10/19/2012, pt states he does not have sleep apnea  . Moderate persistent asthma    followed by pcp--  uncomplicated  . MRSA infection Oct 19, 2008   buttocks  . Pneumonia x 3 last time 10/19/2009  . Pre-diabetes   . Sleep apnea    no CPAP used  . Traumatic complete tear of right rotator cuff 11/30/2018  . Wears dentures    full  . Wears glasses     Patient Active Problem List   Diagnosis Date Noted  . COVID-19 07/18/2020  . Diabetes mellitus type 2, diet-controlled ([place]) 05/16/2019  . Traumatic complete tear of right rotator cuff 11/30/2018  . Essential hypertension 03/20/2018  . Degeneration of intervertebral disc of cervical spine without disc herniation 02/29/2016  . OSA (obstructive sleep apnea) 01/26/2013  . ALLERGIC RHINITIS, SEASONAL 09/15/2009  . Asthma 09/08/2006    Past Surgical History:  Procedure Laterality Date  . [place] STUDY N/A 05/04/2018   Procedure: [place] STUDY;  Surgeon: Jonathon Bellows, MD;  Location: [facility] ENDOSCOPY;  Service: Gastroenterology;  Laterality: N/A;  . anterior cervucal  2015-10-20   with fusion  . COLONOSCOPY  03/25/2014 , 10/19/17  . ESOPHAGEAL MANOMETRY N/A 05/04/2018   Procedure: ESOPHAGEAL MANOMETRY (EM);  Surgeon: Jonathon Bellows, MD;  Location: [facility] ENDOSCOPY;  Service: Gastroenterology;  Laterality: N/A;  . ESOPHAGOGASTRODUODENOSCOPY (EGD) WITH PROPOFOL N/A 02/24/2018   Procedure: ESOPHAGOGASTRODUODENOSCOPY (EGD) WITH PROPOFOL;  Surgeon: Jonathon Bellows, MD;  Location: [facility] ENDOSCOPY;  Service: Gastroenterology;  Laterality: N/A;  . KNEE SURGERY Right 2015  . RESECTION DISTAL CLAVICAL Right 12/06/2018   Procedure: RESECTION DISTAL CLAVICAL AND DEBRIDEMENT OF THE SHOULDER;  Surgeon: Elsie Saas, MD;  Location: [facility];  Service: Orthopedics;  Laterality: Right;  SCALENE BLOCK  . right thumb surgery  2019   trigger finger release  . SHOULDER ARTHROSCOPY WITH ROTATOR CUFF REPAIR AND SUBACROMIAL DECOMPRESSION Left 07-24-2010  dr Noemi Chapel @  [facility]   and Labrum tear debridement  . SHOULDER ARTHROSCOPY WITH ROTATOR CUFF REPAIR AND SUBACROMIAL  DECOMPRESSION Right 12/06/2018   Procedure: SHOULDER ARTHROSCOPY WITH ROTATOR CUFF REPAIR AND SUBACROMIAL DECOMPRESSION;  Surgeon: Elsie Saas, MD;  Location: [facility];  Service: Orthopedics;  Laterality: Right;  SCALENE BLOCK  . UPPER GASTROINTESTINAL ENDOSCOPY  2019       Family History  Problem Relation Age of Onset  . Diabetes Maternal Grandmother   . Colon cancer Maternal Grandmother        dx age 43 - recurance at age 16  . Diabetes Mother   . Hepatitis C Mother   . Prostate cancer Father   . Colon cancer Father        dx - 76's  . Diabetes Paternal Grandmother   . Mental illness Brother   . Mental illness Sister   . Mental illness Daughter   . Stomach cancer Maternal Grandfather   . COPD Other        Aunt  . Diabetes Other        siblings  . Pancreatic cancer Paternal Aunt   . Esophageal cancer Neg Hx   . Rectal cancer Neg Hx     Social History   Tobacco Use  . Smoking status: Former Smoker    Years: 3.00    Types: Cigarettes    Quit date: 07/12/1984    Years since quitting: 36.4  . Smokeless tobacco: Former Systems developer    Types: Snuff    Quit date: 2017  . Tobacco comment: "EXPERIMENTED" W/SMOKING--NOT EVEN A PACK X 1 WEEK.   Vaping Use  . Vaping Use: Never used  Substance Use Topics  . Alcohol use: Not Currently    Alcohol/week: 3.0 standard drinks    Types: 3 Shots of liquor per week  . Drug use: No    Home Medications Prior to Admission medications   Medication Sig Start Date End Date Taking? Authorizing Provider  albuterol (PROVENTIL) (2.5 MG/3ML) 0.083% nebulizer solution Take 3 mLs (2.5 mg total) by nebulization every 6 (six) hours as needed for wheezing or shortness of breath. 09/28/19   Ria Bush, MD  albuterol (VENTOLIN HFA) 108 (90 Base) MCG/ACT inhaler INHALE 2 PUFFS BY MOUTH 4 TIMES A DAY Patient taking differently: 2 puffs every 6 (six) hours as needed. 10/22/19   Copland, Frederico Hamman, MD  amLODipine (NORVASC) 5 MG tablet TAKE  1 TABLET BY MOUTH EVERY DAY 11/11/20   Copland, Frederico Hamman, MD  cetirizine (ZYRTEC) 10 MG tablet Take 10 mg by mouth every morning.     [provider]  cyclobenzaprine (FLEXERIL) 10 MG tablet TAKE 0.5-1 TABLETS (5-10 MG TOTAL) BY MOUTH AT BEDTIME AS NEEDED FOR MUSCLE SPASMS. Patient taking differently: Take 10 mg by mouth at bedtime as needed for muscle spasms. 11/19/20 11/19/21  Copland, Frederico Hamman, MD  FLOVENT HFA 110 MCG/ACT inhaler INHALE 1 PUFF BY MOUTH TWICE A DAY 05/07/19   Copland, Frederico Hamman, MD  fluticasone (FLONASE) 50 MCG/ACT nasal spray SPRAY 2 SPRAYS INTO [facility]  NOSTRIL EVERY DAY 05/20/20   Copland, Frederico Hamman, MD  indomethacin (INDOCIN) 50 MG capsule Take 1 capsule (50 mg total) by mouth 3 (three) times daily with meals. Patient taking differently: Take 50 mg by mouth 3 (three) times daily with meals. Takes 2 x day with meals 08/21/20   Copland, Frederico Hamman, MD  omeprazole (PRILOSEC) 20 MG capsule TAKE 1 CAPSULE BY MOUTH EVERY DAY 04/05/20   Copland, Frederico Hamman, MD  OVER THE COUNTER MEDICATION Mega men plus mvi 1 tab daily    [provider]  oxyCODONE (ROXICODONE) 5 MG immediate release tablet Take 1 tablet (5 mg total) by mouth every 6 (six) hours as needed for moderate pain or severe pain. 12/12/20   Tacy Learn, PA-C  sildenafil (VIAGRA) 100 MG tablet Take 0.5-1 tablets (50-100 mg total) by mouth daily as needed for erectile dysfunction. 05/18/20   Copland, Frederico Hamman, MD  albuterol (PROVENTIL,VENTOLIN) 90 MCG/ACT inhaler Inhale 2 puffs into the lungs every 6 (six) hours as needed for wheezing. 02/15/11 11/30/18  Katherina Mires, MD    Allergies    Lactose intolerance (gi) and Latex  Review of Systems   Review of Systems  Constitutional: Negative for chills and fever.  HENT: Negative.   Respiratory: Negative.  Negative for cough and shortness of breath.   Cardiovascular: Positive for chest pain (Left lateral).  Gastrointestinal: Positive for abdominal distention and abdominal pain.  Negative for nausea and vomiting.  Musculoskeletal: Negative for back pain.       Known left clavicle fracture.  Skin: Negative.  Negative for color change and wound.  Neurological: Positive for syncope.    Physical Exam Updated Vital Signs BP 104/76   Pulse (!) 107   Temp 98.2 F (36.8 C) (Oral)   Resp (!) 24   SpO2 95%   Physical Exam Vitals and nursing note reviewed.  Constitutional:      Appearance: He is well-developed.  HENT:     Head: Normocephalic.  Cardiovascular:     Rate and Rhythm: Regular rhythm. Tachycardia present.  Pulmonary:     Effort: Pulmonary effort is normal.     Breath sounds: Normal breath sounds.  Abdominal:     General: There is distension.     Palpations: Abdomen is soft.     Tenderness: There is abdominal tenderness (Diffuse). There is no guarding or rebound.  Musculoskeletal:        General: Normal range of motion.     Cervical back: Normal range of motion and neck supple.  Skin:    General: Skin is warm and dry.     Findings: No rash.  Neurological:     Mental Status: He is alert and oriented to person, place, and time.     Sensory: No sensory deficit.     Motor: No weakness.     Coordination: Coordination normal.     ED Results / Procedures / Treatments   Labs (all labs ordered are listed, but only abnormal results are displayed) Labs Reviewed  CBC WITH DIFFERENTIAL/PLATELET - Abnormal; Notable for the following components:      Result Value   RBC 4.14 (*)    Hemoglobin 12.2 (*)    HCT 38.0 (*)    All other components within normal limits  COMPREHENSIVE METABOLIC PANEL - Abnormal; Notable for the following components:   Sodium 134 (*)    Glucose, Bld 209 (*)    BUN 22 (*)    Creatinine, Ser 1.26 (*)    Calcium  8.5 (*)    AST 46 (*)    All other components within normal limits  URINALYSIS, ROUTINE W REFLEX MICROSCOPIC  TROPONIN I (HIGH SENSITIVITY)   Results for orders placed or performed during the hospital encounter of  12/13/20  CBC with Differential  Result Value Ref Range   WBC 9.5 4.0 - 10.5 K/uL   RBC 4.14 (L) 4.22 - 5.81 MIL/uL   Hemoglobin 12.2 (L) 13.0 - 17.0 g/dL   HCT 38.0 (L) 39.0 - 52.0 %   MCV 91.8 80.0 - 100.0 fL   MCH 29.5 26.0 - 34.0 pg   MCHC 32.1 30.0 - 36.0 g/dL   RDW 12.9 11.5 - 15.5 %   Platelets 179 150 - 400 K/uL   nRBC 0.0 0.0 - 0.2 %   Neutrophils Relative % 77 %   Neutro Abs 7.2 1.7 - 7.7 K/uL   Lymphocytes Relative 14 %   Lymphs Abs 1.4 0.7 - 4.0 K/uL   Monocytes Relative 8 %   Monocytes Absolute 0.8 0.1 - 1.0 K/uL   Eosinophils Relative 0 %   Eosinophils Absolute 0.0 0.0 - 0.5 K/uL   Basophils Relative 0 %   Basophils Absolute 0.0 0.0 - 0.1 K/uL   Immature Granulocytes 1 %   Abs Immature Granulocytes 0.05 0.00 - 0.07 K/uL  Comprehensive metabolic panel  Result Value Ref Range   Sodium 134 (L) 135 - 145 mmol/L   Potassium 4.4 3.5 - 5.1 mmol/L   Chloride 102 98 - 111 mmol/L   CO2 23 22 - 32 mmol/L   Glucose, Bld 209 (H) 70 - 99 mg/dL   BUN 22 (H) 6 - 20 mg/dL   Creatinine, Ser 1.26 (H) 0.61 - 1.24 mg/dL   Calcium 8.5 (L) 8.9 - 10.3 mg/dL   Total Protein 6.5 6.5 - 8.1 g/dL   Albumin 3.7 3.5 - 5.0 g/dL   AST 46 (H) 15 - 41 U/L   ALT 35 0 - 44 U/L   Alkaline Phosphatase 59 38 - 126 U/L   Total Bilirubin 0.7 0.3 - 1.2 mg/dL   GFR, Estimated >60 >60 mL/min   Anion gap 9 5 - 15    EKG EKG Interpretation  Date/Time:  Saturday December 13 2020 02:01:21 EDT Ventricular Rate:  105 PR Interval:  144 QRS Duration: 77 QT Interval:  336 QTC Calculation: 444 R Axis:   65 Text Interpretation: Sinus tachycardia Confirmed by Dory Horn) on 12/13/2020 4:37:34 AM   Radiology DG Chest 2 View  Result Date: 12/13/2020 CLINICAL DATA:  Pain. Status post fall at work. Recent clavicle fracture. EXAM: CHEST - 2 VIEW COMPARISON:  X-ray clavicle 12/12/2020, chest x-ray 08/14/2017 FINDINGS: The heart size and mediastinal contours are within normal limits. Bibasilar streaky  airspace opacities likely atelectasis. No focal consolidation. Limited evaluation on lateral view due to overlying arm. No pulmonary edema. No pleural effusion. No pneumothorax. Redemonstration of acute comminuted proximal to mid left clavicular fracture. Cervical surgical hardware. IMPRESSION: 1. No definite active cardiopulmonary disease with markedly limited evaluation on lateral view due to overlying arm. 2. Acute comminuted proximal to mid left clavicular fracture. Electronically Signed   By: Iven Finn M.D.   On: 12/13/2020 02:23   DG Clavicle Left  Result Date: 12/12/2020 CLINICAL DATA:  55 year old male status post fall with pain. EXAM: LEFT CLAVICLE - 2+ VIEWS COMPARISON:  Left shoulder series today reported separately. Chest radiographs 08/14/2017. FINDINGS: Chronic lower cervical ACDF. Comminuted fracture of the left midclavicle  with mildly displaced butterfly fragments, over riding, 1/2 shaft width inferior displacement, and inferior angulation of the distal fragment. The acromioclavicular and coracoclavicular intervals appear to remain normal. Negative visible left lung apex. IMPRESSION: Comminuted left midclavicle fracture. Electronically Signed   By: Genevie Ann M.D.   On: 12/12/2020 09:51   CT Head Wo Contrast  Result Date: 12/13/2020 CLINICAL DATA:  Syncope.  Left rib pain. EXAM: CT HEAD WITHOUT CONTRAST CT CERVICAL SPINE WITHOUT CONTRAST TECHNIQUE: Multidetector CT imaging of the head and cervical spine was performed following the standard protocol without intravenous contrast. Multiplanar CT image reconstructions of the cervical spine were also generated. COMPARISON:  CT head 03/15/2018 FINDINGS: CT HEAD FINDINGS Brain: No evidence of large-territorial acute infarction. No parenchymal hemorrhage. No mass lesion. No extra-axial collection. No mass effect or midline shift. No hydrocephalus. Basilar cisterns are patent. Vascular: No hyperdense vessel. Atherosclerotic calcifications are present  within the cavernous internal carotid arteries. Skull: No acute fracture or focal lesion. Sinuses/Orbits: Paranasal sinuses and mastoid air cells are clear. The orbits are unremarkable. Other: None. CT CERVICAL SPINE FINDINGS Alignment: Normal. Skull base and vertebrae: Anterior and interbody fusion of the C7-T1 levels. Multilevel degenerative changes of the spine most prominent at the C5-C6 level. No acute fracture. No aggressive appearing focal osseous lesion or focal pathologic process. Soft tissues and spinal canal: No prevertebral fluid or swelling. No visible canal hematoma. Upper chest: No definite pneumothorax. Other: Nondisplaced acute fracture of the left posterior second rib (7:33, 6:97, 8:48). Question nondisplaced acute fracture of the posterior left third rib (8:57, 7:42). IMPRESSION: 1. Nondisplaced acute fracture of the left posterior second rib. Question nondisplaced acute fracture of the posterior left third rib. No definite associated pneumothorax. 2.  No acute intracranial abnormality. 3. No acute displaced fracture or traumatic listhesis of the cervical spine. Electronically Signed   By: Iven Finn M.D.   On: 12/13/2020 02:34   CT Cervical Spine Wo Contrast  Result Date: 12/13/2020 CLINICAL DATA:  Syncope.  Left rib pain. EXAM: CT HEAD WITHOUT CONTRAST CT CERVICAL SPINE WITHOUT CONTRAST TECHNIQUE: Multidetector CT imaging of the head and cervical spine was performed following the standard protocol without intravenous contrast. Multiplanar CT image reconstructions of the cervical spine were also generated. COMPARISON:  CT head 03/15/2018 FINDINGS: CT HEAD FINDINGS Brain: No evidence of large-territorial acute infarction. No parenchymal hemorrhage. No mass lesion. No extra-axial collection. No mass effect or midline shift. No hydrocephalus. Basilar cisterns are patent. Vascular: No hyperdense vessel. Atherosclerotic calcifications are present within the cavernous internal carotid arteries.  Skull: No acute fracture or focal lesion. Sinuses/Orbits: Paranasal sinuses and mastoid air cells are clear. The orbits are unremarkable. Other: None. CT CERVICAL SPINE FINDINGS Alignment: Normal. Skull base and vertebrae: Anterior and interbody fusion of the C7-T1 levels. Multilevel degenerative changes of the spine most prominent at the C5-C6 level. No acute fracture. No aggressive appearing focal osseous lesion or focal pathologic process. Soft tissues and spinal canal: No prevertebral fluid or swelling. No visible canal hematoma. Upper chest: No definite pneumothorax. Other: Nondisplaced acute fracture of the left posterior second rib (7:33, 6:97, 8:48). Question nondisplaced acute fracture of the posterior left third rib (8:57, 7:42). IMPRESSION: 1. Nondisplaced acute fracture of the left posterior second rib. Question nondisplaced acute fracture of the posterior left third rib. No definite associated pneumothorax. 2.  No acute intracranial abnormality. 3. No acute displaced fracture or traumatic listhesis of the cervical spine. Electronically Signed   By: Clelia Croft.D.  On: 12/13/2020 02:34   DG Shoulder Left  Result Date: 12/12/2020 CLINICAL DATA:  Fall.  Shoulder pain EXAM: LEFT SHOULDER - 2+ VIEW COMPARISON:  None FINDINGS: Midshaft LEFT clavicle fracture with caudal angulation. Mild comminution at the fracture plane. LEFT shoulder joint tact.  Anterior cervical fusion noted IMPRESSION: Midshaft LEFT clavicle fracture. Electronically Signed   By: Suzy Bouchard M.D.   On: 12/12/2020 09:53    Procedures Procedures CRITICAL CARE Performed by: Dewaine Oats   Total critical care time: 65 minutes  Critical care time was exclusive of separately billable procedures and treating other patients.  Critical care was necessary to treat or prevent imminent or life-threatening deterioration.  Critical care was time spent personally by me on the following activities: development of treatment  plan with patient and/or surrogate as well as nursing, discussions with consultants, evaluation of patient's response to treatment, examination of patient, obtaining history from patient or surrogate, ordering and performing treatments and interventions, ordering and review of laboratory studies, ordering and review of radiographic studies, pulse oximetry and re-evaluation of patient's condition.   Medications Ordered in ED Medications  sodium chloride (PF) 0.9 % injection (has no administration in time range)  sodium chloride 0.9 % bolus 1,000 mL (0 mLs Intravenous Stopped 12/13/20 0400)  ketorolac (TORADOL) 30 MG/ML injection 15 mg (15 mg Intravenous Given 12/13/20 0258)  sodium chloride 0.9 % bolus 1,000 mL (0 mLs Intravenous Stopped 12/13/20 0505)  iohexol (OMNIPAQUE) 350 MG/ML injection 100 mL (100 mLs Intravenous Contrast Given 12/13/20 0510)    ED Course  I have reviewed the triage vital signs and the nursing notes.  Pertinent labs & imaging results that were available during my care of the patient were reviewed by me and considered in my medical decision making (see chart for details).    MDM Rules/Calculators/A&P                         2:15 - patient evaluated for syncopal episode x 2 tonight as detailed in the HPI. He is mildly tachycardic to 106, normal blood pressure at 126/87. EKG Sinus tachycardia. Labs pending.   2:50 - Blood pressure now soft at 108/66, still tachycardic. No further syncopal episodes. IV fluids ordered. CXR shows left posterior rib fractures. Lungs field clear caveat that his splinted arm rests across his chest.  4:00 - no change in blood pressure or heart rate, will start 2nd bag. 4:30 - patient remains hypotensive after 1 liter of fluid with systolic of 865, tachycardic. Complains of progressive abdominal pain. On re-exam, abdomen is more distended. Not peritonitic.  Breathing rate increasing. No hypoxia. CTA chest abd pel ordered, concern for dissection, splenic  injury, internal bleeding. 6:00 - call from radiology. High suspicion for Grade 3 or 4 injury, can't rule out shatter but states poor contrast timing so less than optimal study. Dr. Marlou Starks paged immediately who will see the patient and evaluate. Patient and family updated on diagnosis.   Final Clinical Impression(s) / ED Diagnoses Final diagnoses:  None    Rx / DC Orders ED Discharge Orders    None       Dennie Bible 12/13/20 0610    Palumbo, April, MD 12/13/20 Springer, April, MD 12/13/20 [id number]

## 2020-12-13 NOTE — TOC CAGE-AID Note (Signed)
Transition of Care [facility]) - CAGE-AID Screening   Patient Details  Name: Clarence Alvarez MRN: [id number] Date of Birth: 20-Aug-1965  Transition of Care [facility]) CM/SW Contact:    Oretha Milch, LCSW Phone Number: 12/13/2020, 11:38 AM   Clinical Narrative: CSW met with patient to engage in CAGE/AID screen per trauma protocol. Patient presented in pain but engaged in conversation. Patient had his wife and daughter and requested them to stay during conversation. Patient declines any alcohol use or mental health and reports when his wife left him a few years ago he had difficulties but turned to his faith and has been sober since then. Patient inquired about HCPOA information and CSW provided materials and discussed the process with them. CSW encouraged patient and family to reach out with any questions     CAGE-AID Screening:    Have You Ever Felt You Ought to Cut Down on Your Drinking or Drug Use?: No Have People Annoyed You By [employer] Your Drinking Or Drug Use?: No Have You Felt Bad Or Guilty About Your Drinking Or Drug Use?: No Have You Ever Had a Drink or Used Drugs First Thing In The Morning to Steady Your Nerves or to Get Rid of a Hangover?: No CAGE-AID Score: 0  Substance Abuse Education Offered: No

## 2020-12-13 NOTE — H&P (Signed)
Clarence Alvarez is an 55 y.o. male.   Chief Complaint: fall HPI: The patient is a 55 year old black male who fell at work yesterday and landed on his left side. There was LOC. Complained of chest pain. Evaluated in ER with xrays and found to have a clavicle fx. Sent home and fell again this am. Was having abd pain yesterday. Came to ER with some hypotension and mild tachycardia. Responding to fluids.  Past Medical History:  Diagnosis Date  . Allergic rhinitis, seasonal   . Anterior displaced fracture of sternal end of left clavicle, initial encounter for closed fracture 12/12/2020  . Arthritis    right knee and both shoulders and bursisits  . Concussion age 65   no residual  . GERD (gastroesophageal reflux disease)   . History of adenomatous polyp of colon    03-25-2014  tubular adenoma's /  hyperplastic  . History of hiatal hernia   . History of kidney stones October 08, 2016   passed on own  . Hypertension   . Left ureteral stone    10-08-2016  . Mild obstructive sleep apnea    per study Oct 08, 2012, pt states he does not have sleep apnea  . Moderate persistent asthma    followed by pcp-- uncomplicated  . MRSA infection 10-08-08   buttocks  . Pneumonia x 3 last time 10/08/2009  . Pre-diabetes   . Sleep apnea    no CPAP used  . Traumatic complete tear of right rotator cuff 11/30/2018  . Wears dentures    full  . Wears glasses     Past Surgical History:  Procedure Laterality Date  . [place] STUDY N/A 05/04/2018   Procedure: [place] STUDY;  Surgeon: Jonathon Bellows, MD;  Location: [facility] ENDOSCOPY;  Service: Gastroenterology;  Laterality: N/A;  . anterior cervucal  2015/10/09   with fusion  . COLONOSCOPY  03/25/2014 , 08-Oct-2017  . ESOPHAGEAL MANOMETRY N/A 05/04/2018   Procedure: ESOPHAGEAL MANOMETRY (EM);  Surgeon: Jonathon Bellows, MD;  Location: [facility] ENDOSCOPY;  Service: Gastroenterology;  Laterality: N/A;  . ESOPHAGOGASTRODUODENOSCOPY (EGD) WITH PROPOFOL N/A 02/24/2018   Procedure: ESOPHAGOGASTRODUODENOSCOPY (EGD) WITH  PROPOFOL;  Surgeon: Jonathon Bellows, MD;  Location: [facility] ENDOSCOPY;  Service: Gastroenterology;  Laterality: N/A;  . KNEE SURGERY Right October 08, 2013  . RESECTION DISTAL CLAVICAL Right 12/06/2018   Procedure: RESECTION DISTAL CLAVICAL AND DEBRIDEMENT OF THE SHOULDER;  Surgeon: Elsie Saas, MD;  Location: [place];  Service: Orthopedics;  Laterality: Right;  SCALENE BLOCK  . right thumb surgery  2017-10-08   trigger finger release  . SHOULDER ARTHROSCOPY WITH ROTATOR CUFF REPAIR AND SUBACROMIAL DECOMPRESSION Left 07-24-2010  dr Noemi Chapel @  [facility]   and Labrum tear debridement  . SHOULDER ARTHROSCOPY WITH ROTATOR CUFF REPAIR AND SUBACROMIAL DECOMPRESSION Right 12/06/2018   Procedure: SHOULDER ARTHROSCOPY WITH ROTATOR CUFF REPAIR AND SUBACROMIAL DECOMPRESSION;  Surgeon: Elsie Saas, MD;  Location: [place];  Service: Orthopedics;  Laterality: Right;  SCALENE BLOCK  . UPPER GASTROINTESTINAL ENDOSCOPY  10-08-2017    Family History  Problem Relation Age of Onset  . Diabetes Maternal Grandmother   . Colon cancer Maternal Grandmother        dx age 55 - recurance at age 33  . Diabetes Mother   . Hepatitis C Mother   . Prostate cancer Father   . Colon cancer Father        dx - 53's  . Diabetes Paternal Grandmother   . Mental illness Brother   .  Mental illness Sister   . Mental illness Daughter   . Stomach cancer Maternal Grandfather   . COPD Other        Aunt  . Diabetes Other        siblings  . Pancreatic cancer Paternal Aunt   . Esophageal cancer Neg Hx   . Rectal cancer Neg Hx    Social History:  reports that he quit smoking about 36 years ago. His smoking use included cigarettes. He quit after 3.00 years of use. He quit smokeless tobacco use about 5 years ago.  His smokeless tobacco use included snuff. He reports previous alcohol use of about 3.0 standard drinks of alcohol per week. He reports that he does not use drugs.  Allergies:  Allergies  Allergen Reactions  .  Lactose Intolerance (Gi)     Gas and bloating  . Latex     Skin irritation dsicoloration/blisters    (Not in a hospital admission)   Results for orders placed or performed during the hospital encounter of 12/13/20 (from the past 48 hour(s))  CBC with Differential     Status: Abnormal   Collection Time: 12/13/20  2:21 AM  Result Value Ref Range   WBC 9.5 4.0 - 10.5 K/uL   RBC 4.14 (L) 4.22 - 5.81 MIL/uL   Hemoglobin 12.2 (L) 13.0 - 17.0 g/dL   HCT 38.0 (L) 39.0 - 52.0 %   MCV 91.8 80.0 - 100.0 fL   MCH 29.5 26.0 - 34.0 pg   MCHC 32.1 30.0 - 36.0 g/dL   RDW 12.9 11.5 - 15.5 %   Platelets 179 150 - 400 K/uL   nRBC 0.0 0.0 - 0.2 %   Neutrophils Relative % 77 %   Neutro Abs 7.2 1.7 - 7.7 K/uL   Lymphocytes Relative 14 %   Lymphs Abs 1.4 0.7 - 4.0 K/uL   Monocytes Relative 8 %   Monocytes Absolute 0.8 0.1 - 1.0 K/uL   Eosinophils Relative 0 %   Eosinophils Absolute 0.0 0.0 - 0.5 K/uL   Basophils Relative 0 %   Basophils Absolute 0.0 0.0 - 0.1 K/uL   Immature Granulocytes 1 %   Abs Immature Granulocytes 0.05 0.00 - 0.07 K/uL    Comment: Performed at [facility], [place] [address] [place],  [address]  Comprehensive metabolic panel     Status: Abnormal   Collection Time: 12/13/20  2:21 AM  Result Value Ref Range   Sodium 134 (L) 135 - 145 mmol/L   Potassium 4.4 3.5 - 5.1 mmol/L   Chloride 102 98 - 111 mmol/L   CO2 23 22 - 32 mmol/L   Glucose, Bld 209 (H) 70 - 99 mg/dL    Comment: Glucose reference range applies only to samples taken after fasting for at least 8 hours.   BUN 22 (H) 6 - 20 mg/dL   Creatinine, Ser 1.26 (H) 0.61 - 1.24 mg/dL   Calcium 8.5 (L) 8.9 - 10.3 mg/dL   Total Protein 6.5 6.5 - 8.1 g/dL   Albumin 3.7 3.5 - 5.0 g/dL   AST 46 (H) 15 - 41 U/L   ALT 35 0 - 44 U/L   Alkaline Phosphatase 59 38 - 126 U/L   Total Bilirubin 0.7 0.3 - 1.2 mg/dL   GFR, Estimated >60 >60 mL/min    Comment: (NOTE) Calculated using the CKD-EPI Creatinine  Equation (2021)    Anion gap 9 5 - 15    Comment: Performed at [facility], [place] [address], [place], [place] [address]   DG Chest 2 View  Result Date: 12/13/2020 CLINICAL DATA:  Pain. Status post fall at work. Recent clavicle fracture. EXAM: CHEST - 2 VIEW COMPARISON:  X-ray clavicle 12/12/2020, chest x-ray 08/14/2017 FINDINGS: The heart size and mediastinal contours are within normal limits. Bibasilar streaky airspace opacities likely atelectasis. No focal consolidation. Limited evaluation on lateral view due to overlying arm. No pulmonary edema. No pleural effusion. No pneumothorax. Redemonstration of acute comminuted proximal to mid left clavicular fracture. Cervical surgical hardware. IMPRESSION: 1. No definite active cardiopulmonary disease with markedly limited evaluation on lateral view due to overlying arm. 2. Acute comminuted proximal to mid left clavicular fracture. Electronically Signed   By: Iven Finn M.D.   On: 12/13/2020 02:23   DG Clavicle Left  Result Date: 12/12/2020 CLINICAL DATA:  55 year old male status post fall with pain. EXAM: LEFT CLAVICLE - 2+ VIEWS COMPARISON:  Left shoulder series today reported separately. Chest radiographs 08/14/2017. FINDINGS: Chronic lower cervical ACDF. Comminuted fracture of the left midclavicle with mildly displaced butterfly fragments, over riding, 1/2 shaft width inferior displacement, and inferior angulation of the distal fragment. The acromioclavicular and coracoclavicular intervals appear to remain normal. Negative visible left lung apex. IMPRESSION: Comminuted left midclavicle fracture. Electronically Signed   By: Genevie Ann M.D.   On: 12/12/2020 09:51   CT Head Wo Contrast  Result Date: 12/13/2020 CLINICAL DATA:  Syncope.  Left rib pain. EXAM: CT HEAD WITHOUT CONTRAST CT CERVICAL SPINE WITHOUT CONTRAST TECHNIQUE: Multidetector CT imaging of the head and cervical spine was performed following the standard protocol without  intravenous contrast. Multiplanar CT image reconstructions of the cervical spine were also generated. COMPARISON:  CT head 03/15/2018 FINDINGS: CT HEAD FINDINGS Brain: No evidence of large-territorial acute infarction. No parenchymal hemorrhage. No mass lesion. No extra-axial collection. No mass effect or midline shift. No hydrocephalus. Basilar cisterns are patent. Vascular: No hyperdense vessel. Atherosclerotic calcifications are present within the cavernous internal carotid arteries. Skull: No acute fracture or focal lesion. Sinuses/Orbits: Paranasal sinuses and mastoid air cells are clear. The orbits are unremarkable. Other: None. CT CERVICAL SPINE FINDINGS Alignment: Normal. Skull base and vertebrae: Anterior and interbody fusion of the C7-T1 levels. Multilevel degenerative changes of the spine most prominent at the C5-C6 level. No acute fracture. No aggressive appearing focal osseous lesion or focal pathologic process. Soft tissues and spinal canal: No prevertebral fluid or swelling. No visible canal hematoma. Upper chest: No definite pneumothorax. Other: Nondisplaced acute fracture of the left posterior second rib (7:33, 6:97, 8:48). Question nondisplaced acute fracture of the posterior left third rib (8:57, 7:42). IMPRESSION: 1. Nondisplaced acute fracture of the left posterior second rib. Question nondisplaced acute fracture of the posterior left third rib. No definite associated pneumothorax. 2.  No acute intracranial abnormality. 3. No acute displaced fracture or traumatic listhesis of the cervical spine. Electronically Signed   By: Iven Finn M.D.   On: 12/13/2020 02:34   CT Cervical Spine Wo Contrast  Result Date: 12/13/2020 CLINICAL DATA:  Syncope.  Left rib pain. EXAM: CT HEAD WITHOUT CONTRAST CT CERVICAL SPINE WITHOUT CONTRAST TECHNIQUE: Multidetector CT imaging of the head and cervical spine was performed following the standard protocol without intravenous contrast. Multiplanar CT image  reconstructions of the cervical spine were also generated. COMPARISON:  CT head 03/15/2018 FINDINGS: CT HEAD FINDINGS Brain: No evidence of large-territorial acute infarction. No parenchymal hemorrhage. No mass lesion. No extra-axial collection. No mass effect or midline  shift. No hydrocephalus. Basilar cisterns are patent. Vascular: No hyperdense vessel. Atherosclerotic calcifications are present within the cavernous internal carotid arteries. Skull: No acute fracture or focal lesion. Sinuses/Orbits: Paranasal sinuses and mastoid air cells are clear. The orbits are unremarkable. Other: None. CT CERVICAL SPINE FINDINGS Alignment: Normal. Skull base and vertebrae: Anterior and interbody fusion of the C7-T1 levels. Multilevel degenerative changes of the spine most prominent at the C5-C6 level. No acute fracture. No aggressive appearing focal osseous lesion or focal pathologic process. Soft tissues and spinal canal: No prevertebral fluid or swelling. No visible canal hematoma. Upper chest: No definite pneumothorax. Other: Nondisplaced acute fracture of the left posterior second rib (7:33, 6:97, 8:48). Question nondisplaced acute fracture of the posterior left third rib (8:57, 7:42). IMPRESSION: 1. Nondisplaced acute fracture of the left posterior second rib. Question nondisplaced acute fracture of the posterior left third rib. No definite associated pneumothorax. 2.  No acute intracranial abnormality. 3. No acute displaced fracture or traumatic listhesis of the cervical spine. Electronically Signed   By: Iven Finn M.D.   On: 12/13/2020 02:34   DG Shoulder Left  Result Date: 12/12/2020 CLINICAL DATA:  Fall.  Shoulder pain EXAM: LEFT SHOULDER - 2+ VIEW COMPARISON:  None FINDINGS: Midshaft LEFT clavicle fracture with caudal angulation. Mild comminution at the fracture plane. LEFT shoulder joint tact.  Anterior cervical fusion noted IMPRESSION: Midshaft LEFT clavicle fracture. Electronically Signed   By: Suzy Bouchard M.D.   On: 12/12/2020 09:53   CT Angio Chest/Abd/Pel for Dissection W and/or Wo Contrast  Result Date: 12/13/2020 CLINICAL DATA:  c/o syncope and L rib pain. Pt broke his clavicle today from a fall at work and is scheduled for surgery on Wed EXAM: CT ANGIOGRAPHY CHEST, ABDOMEN AND PELVIS TECHNIQUE: Non-contrast CT of the chest was initially obtained. Multidetector CT imaging through the chest, abdomen and pelvis was performed using the standard protocol during bolus administration of intravenous contrast. Multiplanar reconstructed images and MIPs were obtained and reviewed to evaluate the vascular anatomy. CONTRAST:  178mL OMNIPAQUE IOHEXOL 350 MG/ML SOLN COMPARISON:  CT abdomen pelvis 09/08/2017 FINDINGS: Markedly limited evaluation of the thorax and upper abdomen due to streak artifact originating from the left upper extremity along the patient's side. CTA CHEST FINDINGS Cardiovascular: Preferential opacification of the pulmonary artery with no pulmonary embolus. Normal heart size. No significant pericardial effusion. The thoracic aorta is normal in caliber. No definite thoracic aorta injury. No atherosclerotic plaque of the thoracic aorta. No coronary artery calcifications. Mediastinum/Nodes: No pneumomediastinum. No mediastinal hematoma. No enlarged mediastinal, hilar, or axillary lymph nodes. Thyroid gland, trachea, and esophagus demonstrate no significant findings. Lungs/Pleura: No focal consolidation. No pulmonary nodule. No pulmonary mass. Bilateral lower lobe atelectasis. No pulmonary contusion or laceration. Trace left pleural fluid. No definite left pneumothorax with a possible foci of gas along the pleura noted posteriorly (7:15). No right pleural fluid. No right pneumothorax. Musculoskeletal: Partially visualized comminuted mid to distal clavicular fracture. Acute fracture of the left posterior third nondisplaced, posterior fourth nondisplaced, posterior fifth nondisplaced, anterolateral  fifth displaced, posterolateral sixth displaced, posterior sixth nondisplaced, posterolateral seventh displaced, posterior seventh nondisplaced, comminuted posterior eighth, posterolateral eighth minimally displaced, posterior ninth nondisplaced, posterior tenth minimally displaced. Review of the MIP images confirms the above findings. CTA ABDOMEN AND PELVIS FINDINGS VASCULAR Aorta: Normal caliber aorta without aneurysm, dissection, vasculitis or significant stenosis. Celiac: Patent without evidence of aneurysm, dissection, vasculitis or significant stenosis. SMA: Patent without evidence of aneurysm, dissection, vasculitis or significant stenosis. Renals:  Both renal arteries are patent without evidence of aneurysm, dissection, vasculitis, fibromuscular dysplasia or significant stenosis. IMA: Patent without evidence of aneurysm, dissection, vasculitis or significant stenosis. Inflow: Patent without evidence of aneurysm, dissection, vasculitis or significant stenosis. Veins: No obvious venous abnormality within the limitations of this arterial phase study. Review of the MIP images confirms the above findings. NON-VASCULAR Diaphragm: Liely left hemidiaphragmatic injury. Hepatobiliary: No focal liver abnormality is seen. No gallstones, gallbladder wall thickening, or biliary dilatation. Markedly limited evaluation for hepatic injury due to timing of contrast. Pancreas: No focal lesion. Normal pancreatic contour. No surrounding inflammatory changes. No main pancreatic ductal dilatation. Spleen: The splenic parenchyma is not well visualized with poorly defined splenic borders. Markedly limited evaluation due to timing of contrast and streak artifact originating from the left upper extremity. Normal in size without focal abnormality. Adrenals/Urinary Tract: No adrenal nodule bilaterally. No nephrolithiasis, no hydronephrosis, and no contour-deforming renal mass. No ureterolithiasis or hydroureter. The urinary bladder is  unremarkable. Stomach/Bowel: Stomach is within normal limits. No evidence of bowel wall thickening or dilatation. Appendix appears normal. Lymphatic: No lymphadenopathy. Reproductive: Prostate is unremarkable. Other: At least moderate volume hemoperitoneum with both perisplenic and perihepatic fluid noted. Question slightly higher density fluid surrounding the spleen. Perisplenic fat stranding. No intraperitoneal free gas. No organized fluid collection. Musculoskeletal: No abdominal wall hernia or abnormality. No suspicious lytic or blastic osseous lesions. No acute displaced fracture. Multilevel degenerative changes of the spine. Review of the MIP images confirms the above findings. IMPRESSION: 1. Splenic injury with perisplenic hemorrhage and moderate volume hemoperitoneum. Possible shattered spleen with markedly limited evaluation due to timing of contrast and streak artifact originating from the left upper extremity. Unable to evaluate for active bleeding on this arterial phase only study of the abdomen/pelvis. 2. Trace left pleural fluid with limited evaluation due to streak artifact. No definite left pneumothorax with a possible foci of gas along the pleura posteriorly. 3. Associated multiple consecutive left rib fractures (4-10th) with several ribs broken in a couple of different places including the 5th, 6th, 7th, and 8th ribs. 4. Partially visualized comminuted mid to distal left clavicular fracture. 5. Likely left hemidiaphragmatic injury. 6. A hepatic injury cannot be excluded due to timing of contrast. These results were called by telephone at the time of interpretation on 12/13/2020 at 5:58 am to provider [facility] , who verbally acknowledged these results. Electronically Signed   By: Iven Finn M.D.   On: 12/13/2020 06:16    Review of Systems  Constitutional: Positive for diaphoresis.  HENT: Negative.   Eyes: Negative.   Respiratory: Negative.   Cardiovascular: Positive for chest pain.   Gastrointestinal: Positive for abdominal pain and nausea.  Endocrine: Negative.   Genitourinary: Negative.   Musculoskeletal: Positive for back pain. Negative for neck pain.  Skin: Negative.   Allergic/Immunologic: Negative.   Neurological: Positive for light-headedness.  Hematological: Negative.   Psychiatric/Behavioral: Negative.     Blood pressure 115/64, pulse (!) 102, temperature 98.2 F (36.8 C), temperature source Oral, resp. rate (!) 23, SpO2 99 %. Physical Exam Constitutional:      Appearance: Normal appearance. He is diaphoretic.     Comments: Appears uncomfortable  HENT:     Head: Normocephalic and atraumatic.     Right Ear: External ear normal.     Left Ear: External ear normal.     Nose: Nose normal.     Mouth/Throat:     Mouth: Mucous membranes are moist.     Pharynx:  Oropharynx is clear.  Eyes:     Extraocular Movements: Extraocular movements intact.     Conjunctiva/sclera: Conjunctivae normal.     Pupils: Pupils are equal, round, and reactive to light.  Neck:     Comments: No posterior tenderness Cardiovascular:     Rate and Rhythm: Normal rate and regular rhythm.     Pulses: Normal pulses.     Heart sounds: Normal heart sounds.     Comments: HR between 105-110 Pulmonary:     Effort: Pulmonary effort is normal.     Breath sounds: Normal breath sounds.     Comments: Tenderness of chest wall from rib fxs Abdominal:     General: There is no distension.     Palpations: Abdomen is soft.     Comments: There is moderate tenderness but abd is soft  Musculoskeletal:        General: No swelling or tenderness. Normal range of motion.     Cervical back: Normal range of motion and neck supple.  Skin:    General: Skin is warm.  Neurological:     General: No focal deficit present.     Mental Status: He is alert and oriented to person, place, and time.  Psychiatric:        Mood and Affect: Mood normal.        Behavior: Behavior normal.       Assessment/Plan The patient experienced a fall yesterday and today. He has multiple rib fxs and clavicle fx on left as well as a spleen lac. There is hemoperitoneum but he appears stable and Hg is 12. Will transfer to [facility] to trauma service to the [facility]  Autumn Messing III, MD 12/13/2020, 6:32 AM

## 2020-12-13 NOTE — Progress Notes (Signed)
Patient seen and examined. CT images reviewed. Poor contrast timing, but suspect g3 vs g4 injury. Moderate hemoperitoneum. Expect some nausea. LR bolus and transition to LR infusion. NPO, okay ffor sips with meds and small amt of ice chips. Trend hgb q6h. Bedrest. Pain regimen adjusted.  Critical care time: 40min  Jesusita Oka, MD General and [facility]

## 2020-12-13 NOTE — ED Triage Notes (Signed)
Pt came in with c/o syncope and L rib pain. Pt broke his clavicle today from a fall at work and is scheduled for surgery on Wed. Pt was seen at [facility]. He was given 10mg  of oxycodone and 5mg  of Flexeril. Pt got home, got up and went to change thermostat temp when he passed out. Pt is also c/o L rib injury. Pt also passed out when EMS got there. EMS states that pt's radial pulses disappeared but he recovered quickly

## 2020-12-13 NOTE — ED Notes (Signed)
Pt refusing OVS. Pt states that his pain is too severe

## 2020-12-13 NOTE — ED Notes (Signed)
Pt placed on 2L O2 via [place]. Pt O2 saturations dropped after pain med admin

## 2020-12-13 NOTE — ED Notes (Signed)
Attempted report to [facility]. RN unable to take report at this time

## 2020-12-14 LAB — COMPREHENSIVE METABOLIC PANEL
ALT: 29 U/L (ref 0–44)
AST: 37 U/L (ref 15–41)
Albumin: 3.4 g/dL — ABNORMAL LOW (ref 3.5–5.0)
Alkaline Phosphatase: 48 U/L (ref 38–126)
Anion gap: 8 (ref 5–15)
BUN: 36 mg/dL — ABNORMAL HIGH (ref 6–20)
CO2: 21 mmol/L — ABNORMAL LOW (ref 22–32)
Calcium: 8.1 mg/dL — ABNORMAL LOW (ref 8.9–10.3)
Chloride: 105 mmol/L (ref 98–111)
Creatinine, Ser: 2.42 mg/dL — ABNORMAL HIGH (ref 0.61–1.24)
GFR, Estimated: 31 mL/min — ABNORMAL LOW (ref >60)
Glucose, Bld: 165 mg/dL — ABNORMAL HIGH (ref 70–99)
Potassium: 6.5 mmol/L (ref 3.5–5.1)
Sodium: 134 mmol/L — ABNORMAL LOW (ref 135–145)
Total Bilirubin: 0.8 mg/dL (ref 0.3–1.2)
Total Protein: 5.8 g/dL — ABNORMAL LOW (ref 6.5–8.1)

## 2020-12-14 LAB — CBC
HCT: 26.4 % — ABNORMAL LOW (ref 39.0–52.0)
HCT: 25.4 % — ABNORMAL LOW (ref 39.0–52.0)
HCT: 23.8 % — ABNORMAL LOW (ref 39.0–52.0)
Hemoglobin: 8.7 g/dL — ABNORMAL LOW (ref 13.0–17.0)
Hemoglobin: 8.3 g/dL — ABNORMAL LOW (ref 13.0–17.0)
Hemoglobin: 7.8 g/dL — ABNORMAL LOW (ref 13.0–17.0)
MCH: 30.2 pg (ref 26.0–34.0)
MCH: 30 pg (ref 26.0–34.0)
MCH: 30.4 pg (ref 26.0–34.0)
MCHC: 33 g/dL (ref 30.0–36.0)
MCHC: 32.7 g/dL (ref 30.0–36.0)
MCHC: 32.8 g/dL (ref 30.0–36.0)
MCV: 91.7 fL (ref 80.0–100.0)
MCV: 91.7 fL (ref 80.0–100.0)
MCV: 92.6 fL (ref 80.0–100.0)
Platelets: 151 10*3/uL (ref 150–400)
Platelets: 141 10*3/uL — ABNORMAL LOW (ref 150–400)
Platelets: 133 10*3/uL — ABNORMAL LOW (ref 150–400)
RBC: 2.88 MIL/uL — ABNORMAL LOW (ref 4.22–5.81)
RBC: 2.77 MIL/uL — ABNORMAL LOW (ref 4.22–5.81)
RBC: 2.57 MIL/uL — ABNORMAL LOW (ref 4.22–5.81)
RDW: 12.9 % (ref 11.5–15.5)
RDW: 13 % (ref 11.5–15.5)
RDW: 13 % (ref 11.5–15.5)
WBC: 10.1 10*3/uL (ref 4.0–10.5)
WBC: 10.6 10*3/uL — ABNORMAL HIGH (ref 4.0–10.5)
WBC: 9 10*3/uL (ref 4.0–10.5)
nRBC: 0 % (ref 0.0–0.2)
nRBC: 0 % (ref 0.0–0.2)
nRBC: 0 % (ref 0.0–0.2)

## 2020-12-14 LAB — BASIC METABOLIC PANEL
Anion gap: 9 (ref 5–15)
BUN: 39 mg/dL — ABNORMAL HIGH (ref 6–20)
CO2: 22 mmol/L (ref 22–32)
Calcium: 8.6 mg/dL — ABNORMAL LOW (ref 8.9–10.3)
Chloride: 106 mmol/L (ref 98–111)
Creatinine, Ser: 2.21 mg/dL — ABNORMAL HIGH (ref 0.61–1.24)
GFR, Estimated: 34 mL/min — ABNORMAL LOW (ref >60)
Glucose, Bld: 143 mg/dL — ABNORMAL HIGH (ref 70–99)
Potassium: 5.5 mmol/L — ABNORMAL HIGH (ref 3.5–5.1)
Sodium: 137 mmol/L (ref 135–145)

## 2020-12-14 LAB — GLUCOSE, CAPILLARY: Glucose-Capillary: 176 mg/dL — ABNORMAL HIGH (ref 70–99)

## 2020-12-14 LAB — LACTIC ACID, PLASMA: Lactic Acid, Venous: 2.6 mmol/L (ref 0.5–1.9)

## 2020-12-14 MED ORDER — SODIUM CHLORIDE 0.9 % IV BOLUS: 1000.0000 mL | Freq: Once | INTRAVENOUS | Status: DC

## 2020-12-14 MED ORDER — SODIUM CHLORIDE 0.9 % IV BOLUS
1000.0000 mL | Freq: Once | INTRAVENOUS | Status: AC
  Administered 2020-12-14: 1000 mL via INTRAVENOUS

## 2020-12-14 MED ORDER — DEXTROSE 50 % IV SOLN
1.0000 | Freq: Once | INTRAVENOUS | Status: AC
  Administered 2020-12-14: 50 mL via INTRAVENOUS
  Filled 2020-12-14: qty 50

## 2020-12-14 MED ORDER — METHOCARBAMOL 1000 MG/10ML IJ SOLN
1000.0000 mg | Freq: Three times a day (TID) | INTRAVENOUS | Status: DC
  Administered 2020-12-15 (×2): 1000 mg via INTRAVENOUS
  Filled 2020-12-14 (×4): qty 10

## 2020-12-14 MED ORDER — ORAL CARE MOUTH RINSE
15.0000 mL | Freq: Two times a day (BID) | OROMUCOSAL | Status: DC
  Administered 2020-12-14 – 2020-12-20 (×8): 15 mL via OROMUCOSAL

## 2020-12-14 MED ORDER — CALCIUM GLUCONATE-NACL 1-0.675 GM/50ML-% IV SOLN
1.0000 g | Freq: Once | INTRAVENOUS | Status: AC
  Administered 2020-12-14: 1000 mg via INTRAVENOUS
  Filled 2020-12-14: qty 50

## 2020-12-14 MED ORDER — INSULIN ASPART 100 UNIT/ML IV SOLN
10.0000 [IU] | Freq: Once | INTRAVENOUS | Status: AC
  Administered 2020-12-14: 10 [IU] via INTRAVENOUS

## 2020-12-14 NOTE — Progress Notes (Signed)
Trauma/Critical Care Follow Up Note  Subjective:    Overnight Issues:   Objective:  Vital signs for last 24 hours: Temp:  [98.3 F (36.8 C)-99 F (37.2 C)] 99 F (37.2 C) (06/05 0400) Pulse Rate:  [103-122] 108 (06/05 0700) Resp:  [12-32] 15 (06/05 0700) BP: (105-167)/(71-109) 145/71 (06/05 0700) SpO2:  [91 %-99 %] 93 % (06/05 0700)  Hemodynamic parameters for last 24 hours:    Intake/Output from previous day: 06/04 0701 - 06/05 0700 In: 2689.2 [P.O.:30; I.V.:1870.2; IV Piggyback:789] Out: 829 [Urine:875]  Intake/Output this shift: No intake/output data recorded.  Vent settings for last 24 hours:    Physical Exam:  Gen: comfortable, no distress Neuro: non-focal exam HEENT: PERRL Neck: supple CV: RRR Pulm: unlabored breathing Abd: soft, minimally tender GU: clear yellow urine Extr: wwp, no edema   Results for orders placed or performed during the hospital encounter of 12/13/20 (from the past 24 hour(s))  Lactic acid, plasma     Status: Abnormal   Collection Time: 12/13/20  8:47 AM  Result Value Ref Range   Lactic Acid, Venous 6.8 (HH) 0.5 - 1.9 mmol/L  MRSA PCR Screening     Status: None   Collection Time: 12/13/20  9:10 AM   Specimen: Nasal Mucosa; Nasopharyngeal  Result Value Ref Range   MRSA by PCR NEGATIVE NEGATIVE  CBC     Status: Abnormal   Collection Time: 12/13/20  2:02 PM  Result Value Ref Range   WBC 9.6 4.0 - 10.5 K/uL   RBC 3.43 (L) 4.22 - 5.81 MIL/uL   Hemoglobin 10.2 (L) 13.0 - 17.0 g/dL   HCT 31.3 (L) 39.0 - 52.0 %   MCV 91.3 80.0 - 100.0 fL   MCH 29.7 26.0 - 34.0 pg   MCHC 32.6 30.0 - 36.0 g/dL   RDW 12.9 11.5 - 15.5 %   Platelets 182 150 - 400 K/uL   nRBC 0.0 0.0 - 0.2 %  HIV Antibody (routine testing w rflx)     Status: None   Collection Time: 12/13/20  2:02 PM  Result Value Ref Range   HIV Screen 4th Generation wRfx Non Reactive Non Reactive  Type and screen [place]     Status: None   Collection Time:  12/13/20  2:02 PM  Result Value Ref Range   ABO/RH(D) A POS    Antibody Screen NEG    Sample Expiration      12/16/2020,2359 Performed at [facility], [address] [place], [place] [address]   CBC     Status: Abnormal   Collection Time: 12/14/20  1:41 AM  Result Value Ref Range   WBC 10.1 4.0 - 10.5 K/uL   RBC 2.88 (L) 4.22 - 5.81 MIL/uL   Hemoglobin 8.7 (L) 13.0 - 17.0 g/dL   HCT 26.4 (L) 39.0 - 52.0 %   MCV 91.7 80.0 - 100.0 fL   MCH 30.2 26.0 - 34.0 pg   MCHC 33.0 30.0 - 36.0 g/dL   RDW 12.9 11.5 - 15.5 %   Platelets 151 150 - 400 K/uL   nRBC 0.0 0.0 - 0.2 %  Comprehensive metabolic panel     Status: Abnormal   Collection Time: 12/14/20  1:41 AM  Result Value Ref Range   Sodium 134 (L) 135 - 145 mmol/L   Potassium 6.5 (HH) 3.5 - 5.1 mmol/L   Chloride 105 98 - 111 mmol/L   CO2 21 (L) 22 - 32 mmol/L  Glucose, Bld 165 (H) 70 - 99 mg/dL   BUN 36 (H) 6 - 20 mg/dL   Creatinine, Ser 2.42 (H) 0.61 - 1.24 mg/dL   Calcium 8.1 (L) 8.9 - 10.3 mg/dL   Total Protein 5.8 (L) 6.5 - 8.1 g/dL   Albumin 3.4 (L) 3.5 - 5.0 g/dL   AST 37 15 - 41 U/L   ALT 29 0 - 44 U/L   Alkaline Phosphatase 48 38 - 126 U/L   Total Bilirubin 0.8 0.3 - 1.2 mg/dL   GFR, Estimated 31 (L) >60 mL/min   Anion gap 8 5 - 15  Glucose, capillary     Status: Abnormal   Collection Time: 12/14/20  3:34 AM  Result Value Ref Range   Glucose-Capillary 176 (H) 70 - 99 mg/dL  CBC     Status: Abnormal   Collection Time: 12/14/20  6:35 AM  Result Value Ref Range   WBC 10.6 (H) 4.0 - 10.5 K/uL   RBC 2.77 (L) 4.22 - 5.81 MIL/uL   Hemoglobin 8.3 (L) 13.0 - 17.0 g/dL   HCT 25.4 (L) 39.0 - 52.0 %   MCV 91.7 80.0 - 100.0 fL   MCH 30.0 26.0 - 34.0 pg   MCHC 32.7 30.0 - 36.0 g/dL   RDW 13.0 11.5 - 15.5 %   Platelets 141 (L) 150 - 400 K/uL   nRBC 0.0 0.0 - 0.2 %  Basic metabolic panel     Status: Abnormal   Collection Time: 12/14/20  6:35 AM  Result Value Ref Range   Sodium 137 135 - 145 mmol/L   Potassium  5.5 (H) 3.5 - 5.1 mmol/L   Chloride 106 98 - 111 mmol/L   CO2 22 22 - 32 mmol/L   Glucose, Bld 143 (H) 70 - 99 mg/dL   BUN 39 (H) 6 - 20 mg/dL   Creatinine, Ser 2.21 (H) 0.61 - 1.24 mg/dL   Calcium 8.6 (L) 8.9 - 10.3 mg/dL   GFR, Estimated 34 (L) >60 mL/min   Anion gap 9 5 - 15  Lactic acid, plasma     Status: Abnormal   Collection Time: 12/14/20  6:35 AM  Result Value Ref Range   Lactic Acid, Venous 2.6 (HH) 0.5 - 1.9 mmol/L    Assessment & Plan:  Present on Admission: . Spleen laceration    LOS: 1 day   Additional comments:I reviewed the patient's new clinical lab test results.   and I reviewed the patients new imaging test results.    Spleen laceration - trend hgb, cont NPO except sips/chips. Repeat CBC at 1400. Bedrest x72h.  FEN - sips/chips DVT - SCDs, hold LMWH Dispo - ICU   Critical Care Total Time: 35 minutes  Jesusita Oka, MD Trauma & General Surgery Please use [email or web address] to contact on call provider  12/14/2020  *Care during the described time interval was provided by me. I have reviewed this patient's available data, including medical history, events of note, physical examination and test results as part of my evaluation.

## 2020-12-14 NOTE — Progress Notes (Signed)
Patient scheduled for clavicle surgery with Dr. Griffin Basil on June 8 Radiographs reviewed.  Comminuted left midshaft clavicle fracture is present. Recommend sling and nonweightbearing on the left-hand side with decision for or against surgery per original orthopedic team Dr. Marcelino Scot is covering for this team today and I will let him know that the patient is back in the hospital

## 2020-12-15 LAB — BASIC METABOLIC PANEL
Anion gap: 8 (ref 5–15)
BUN: 25 mg/dL — ABNORMAL HIGH (ref 6–20)
CO2: 28 mmol/L (ref 22–32)
Calcium: 8.4 mg/dL — ABNORMAL LOW (ref 8.9–10.3)
Chloride: 102 mmol/L (ref 98–111)
Creatinine, Ser: 1.26 mg/dL — ABNORMAL HIGH (ref 0.61–1.24)
GFR, Estimated: >60 mL/min (ref >60)
Glucose, Bld: 135 mg/dL — ABNORMAL HIGH (ref 70–99)
Potassium: 4.6 mmol/L (ref 3.5–5.1)
Sodium: 138 mmol/L (ref 135–145)

## 2020-12-15 LAB — CBC
HCT: 21.5 % — ABNORMAL LOW (ref 39.0–52.0)
HCT: 22.7 % — ABNORMAL LOW (ref 39.0–52.0)
Hemoglobin: 6.9 g/dL — CL (ref 13.0–17.0)
Hemoglobin: 7.4 g/dL — ABNORMAL LOW (ref 13.0–17.0)
MCH: 30 pg (ref 26.0–34.0)
MCH: 30.6 pg (ref 26.0–34.0)
MCHC: 32.1 g/dL (ref 30.0–36.0)
MCHC: 32.6 g/dL (ref 30.0–36.0)
MCV: 93.5 fL (ref 80.0–100.0)
MCV: 93.8 fL (ref 80.0–100.0)
Platelets: 123 10*3/uL — ABNORMAL LOW (ref 150–400)
Platelets: 114 10*3/uL — ABNORMAL LOW (ref 150–400)
RBC: 2.3 MIL/uL — ABNORMAL LOW (ref 4.22–5.81)
RBC: 2.42 MIL/uL — ABNORMAL LOW (ref 4.22–5.81)
RDW: 13.3 % (ref 11.5–15.5)
RDW: 13.2 % (ref 11.5–15.5)
WBC: 8.8 10*3/uL (ref 4.0–10.5)
WBC: 8.1 10*3/uL (ref 4.0–10.5)
nRBC: 0.2 % (ref 0.0–0.2)
nRBC: 1 % — ABNORMAL HIGH (ref 0.0–0.2)

## 2020-12-15 LAB — PREPARE RBC (CROSSMATCH)

## 2020-12-15 MED ORDER — METHOCARBAMOL 500 MG PO TABS
1000.0000 mg | ORAL_TABLET | Freq: Three times a day (TID) | ORAL | Status: DC
  Administered 2020-12-15 – 2020-12-20 (×14): 1000 mg via ORAL
  Filled 2020-12-15 (×14): qty 2

## 2020-12-15 MED ORDER — SODIUM CHLORIDE 0.9% IV SOLUTION: Freq: Once | INTRAVENOUS | Status: AC

## 2020-12-15 NOTE — Progress Notes (Signed)
Pt transferred to 4NP06 from 4NICU. Report received from Ander Purpura, [place]. Pt arrived with no complaints of pain, foley in place, on 4L [place], vitals WNL, and daughter at bedside. Assessment completed, bed locked and lowered, with call bell and belongings within reach.  Justice Rocher, RN

## 2020-12-15 NOTE — Progress Notes (Signed)
Trauma/Critical Care Follow Up Note  Subjective:    Overnight Issues:   Objective:  Vital signs for last 24 hours: Temp:  [98.2 F (36.8 C)-99.1 F (37.3 C)] 98.3 F (36.8 C) (06/06 0400) Pulse Rate:  [100-117] 104 (06/06 0600) Resp:  [9-19] 14 (06/06 0600) BP: (127-153)/(71-114) 134/83 (06/06 0600) SpO2:  [91 %-96 %] 96 % (06/06 0600)  Hemodynamic parameters for last 24 hours:    Intake/Output from previous day: 06/05 0701 - 06/06 0700 In: 1649.5 [I.V.:1046.5; IV Piggyback:603] Out: 2000 [Urine:2000]  Intake/Output this shift: Total I/O In: -  Out: 900 [Urine:900]  Vent settings for last 24 hours:    Physical Exam:  Gen: comfortable, no distress Neuro: non-focal exam HEENT: PERRL Neck: supple CV: RRR Pulm: unlabored breathing Abd: soft, full, NT GU: clear yellow urine Extr: wwp, no edema   Results for orders placed or performed during the hospital encounter of 12/13/20 (from the past 24 hour(s))  CBC     Status: Abnormal   Collection Time: 12/14/20  1:35 PM  Result Value Ref Range   WBC 9.0 4.0 - 10.5 K/uL   RBC 2.57 (L) 4.22 - 5.81 MIL/uL   Hemoglobin 7.8 (L) 13.0 - 17.0 g/dL   HCT 23.8 (L) 39.0 - 52.0 %   MCV 92.6 80.0 - 100.0 fL   MCH 30.4 26.0 - 34.0 pg   MCHC 32.8 30.0 - 36.0 g/dL   RDW 13.0 11.5 - 15.5 %   Platelets 133 (L) 150 - 400 K/uL   nRBC 0.0 0.0 - 0.2 %  CBC     Status: Abnormal   Collection Time: 12/15/20  4:48 AM  Result Value Ref Range   WBC 8.8 4.0 - 10.5 K/uL   RBC 2.30 (L) 4.22 - 5.81 MIL/uL   Hemoglobin 6.9 (LL) 13.0 - 17.0 g/dL   HCT 21.5 (L) 39.0 - 52.0 %   MCV 93.5 80.0 - 100.0 fL   MCH 30.0 26.0 - 34.0 pg   MCHC 32.1 30.0 - 36.0 g/dL   RDW 13.3 11.5 - 15.5 %   Platelets 123 (L) 150 - 400 K/uL   nRBC 0.2 0.0 - 0.2 %  Basic metabolic panel     Status: Abnormal   Collection Time: 12/15/20  4:48 AM  Result Value Ref Range   Sodium 138 135 - 145 mmol/L   Potassium 4.6 3.5 - 5.1 mmol/L   Chloride 102 98 - 111 mmol/L    CO2 28 22 - 32 mmol/L   Glucose, Bld 135 (H) 70 - 99 mg/dL   BUN 25 (H) 6 - 20 mg/dL   Creatinine, Ser 1.26 (H) 0.61 - 1.24 mg/dL   Calcium 8.4 (L) 8.9 - 10.3 mg/dL   GFR, Estimated >60 >60 mL/min   Anion gap 8 5 - 15    Assessment & Plan: The plan of care was discussed with the bedside nurse for the night, who is in agreement with this plan and no additional concerns were raised.   Present on Admission: . Spleen laceration    LOS: 2 days   Additional comments:I reviewed the patient's new clinical lab test results.   and I reviewed the patients new imaging test results.    [facility]  Spleen laceration - Bedrest x72h (ambulate 6/7). 1u pRBC for tachycardia, increased O2 reqt and hgb 6.9 L clavicle fx - to OR with Dr. Griffin Basil 6/8 AKI - improving with hydration, transition methocarbamol to PO FEN - CLD DVT - SCDs, hold  LMWH until hgb stability Dispo - SDU   Jesusita Oka, MD Trauma & General Surgery Please use [email or web address] to contact on call provider  12/15/2020  *Care during the described time interval was provided by me. I have reviewed this patient's available data, including medical history, events of note, physical examination and test results as part of my evaluation.

## 2020-12-15 NOTE — Progress Notes (Signed)
Date and time results received: 12/15/20 0640   Critical Value: HGB 6.8  Name of Provider Notified: [place]  Orders Received? Or Actions Taken?: awaiting orders.

## 2020-12-16 ENCOUNTER — Inpatient Hospital Stay (HOSPITAL_COMMUNITY): Payer: Commercial Managed Care - PPO

## 2020-12-16 HISTORY — PX: IR ANGIOGRAM FOLLOW UP STUDY: IMG697

## 2020-12-16 HISTORY — PX: IR ANGIOGRAM VISCERAL SELECTIVE: IMG657

## 2020-12-16 HISTORY — PX: IR EMBO ART  VEN HEMORR LYMPH EXTRAV  INC GUIDE ROADMAPPING: IMG5450

## 2020-12-16 HISTORY — PX: IR US GUIDE VASC ACCESS RIGHT: IMG2390

## 2020-12-16 HISTORY — PX: IR ANGIOGRAM SELECTIVE EACH ADDITIONAL VESSEL: IMG667

## 2020-12-16 LAB — CBC
HCT: 21.2 % — ABNORMAL LOW (ref 39.0–52.0)
Hemoglobin: 6.9 g/dL — CL (ref 13.0–17.0)
MCH: 30.7 pg (ref 26.0–34.0)
MCHC: 32.5 g/dL (ref 30.0–36.0)
MCV: 94.2 fL (ref 80.0–100.0)
Platelets: 125 10*3/uL — ABNORMAL LOW (ref 150–400)
RBC: 2.25 MIL/uL — ABNORMAL LOW (ref 4.22–5.81)
RDW: 13.2 % (ref 11.5–15.5)
WBC: 6.3 10*3/uL (ref 4.0–10.5)
nRBC: 0.9 % — ABNORMAL HIGH (ref 0.0–0.2)

## 2020-12-16 LAB — BASIC METABOLIC PANEL
Anion gap: 6 (ref 5–15)
BUN: 15 mg/dL (ref 6–20)
CO2: 31 mmol/L (ref 22–32)
Calcium: 8.3 mg/dL — ABNORMAL LOW (ref 8.9–10.3)
Chloride: 100 mmol/L (ref 98–111)
Creatinine, Ser: 1.03 mg/dL (ref 0.61–1.24)
GFR, Estimated: >60 mL/min (ref >60)
Glucose, Bld: 104 mg/dL — ABNORMAL HIGH (ref 70–99)
Potassium: 3.7 mmol/L (ref 3.5–5.1)
Sodium: 137 mmol/L (ref 135–145)

## 2020-12-16 LAB — HEMOGLOBIN AND HEMATOCRIT, BLOOD
HCT: 23.7 % — ABNORMAL LOW (ref 39.0–52.0)
Hemoglobin: 7.8 g/dL — ABNORMAL LOW (ref 13.0–17.0)

## 2020-12-16 LAB — PREPARE RBC (CROSSMATCH)

## 2020-12-16 MED ORDER — SODIUM CHLORIDE 0.9% IV SOLUTION: Freq: Once | INTRAVENOUS | Status: DC

## 2020-12-16 MED ORDER — IOHEXOL 300 MG/ML  SOLN
100.0000 mL | Freq: Once | INTRAMUSCULAR | Status: AC | PRN
  Administered 2020-12-16: 80 mL via INTRA_ARTERIAL

## 2020-12-16 MED ORDER — MIDAZOLAM HCL 2 MG/2ML IJ SOLN
INTRAMUSCULAR | Status: AC | PRN
  Administered 2020-12-16: 0.5 mg via INTRAVENOUS
  Administered 2020-12-16: 1 mg via INTRAVENOUS

## 2020-12-16 MED ORDER — FENTANYL CITRATE (PF) 100 MCG/2ML IJ SOLN
INTRAMUSCULAR | Status: AC
  Filled 2020-12-16: qty 2

## 2020-12-16 MED ORDER — LIDOCAINE 5 % EX PTCH
1.0000 | MEDICATED_PATCH | CUTANEOUS | Status: DC
  Administered 2020-12-16 – 2020-12-20 (×5): 1 via TRANSDERMAL
  Filled 2020-12-16 (×5): qty 1

## 2020-12-16 MED ORDER — LIDOCAINE HCL (PF) 1 % IJ SOLN
INTRAMUSCULAR | Status: AC
  Filled 2020-12-16: qty 30

## 2020-12-16 MED ORDER — FENTANYL CITRATE (PF) 100 MCG/2ML IJ SOLN
INTRAMUSCULAR | Status: AC | PRN
  Administered 2020-12-16: 25 ug via INTRAVENOUS
  Administered 2020-12-16: 50 ug via INTRAVENOUS

## 2020-12-16 MED ORDER — MIDAZOLAM HCL 2 MG/2ML IJ SOLN
INTRAMUSCULAR | Status: AC
  Filled 2020-12-16: qty 2

## 2020-12-16 NOTE — Progress Notes (Signed)
OT Cancellation Note  Patient Details Name: Clarence Alvarez MRN: [id number] DOB: 11/20/1965   Cancelled Treatment:    Reason Eval/Treat Not Completed: Medical issues which prohibited therapy;Active bedrest order (Pt went to IR for splenic embolization and requires 4-6 hours of bedrest s/p procedure.  OT to continue to follow for next available treatment time for OT Eval.)   Jefferey Pica, OTR/L Acute Rehabilitation Services Pager: [phone] Office: [phone]   Reshunda Strider C 12/16/2020, 11:17 AM

## 2020-12-16 NOTE — Progress Notes (Signed)
Patient ID: LYNNWOOD BECKFORD, male   DOB: 1966/05/30, 55 y.o.   MRN: [id number]     Subjective: Some LUQ pressure No nausea  ROS negative except as listed above. Objective: Vital signs in last 24 hours: Temp:  [97.9 F (36.6 C)-99.4 F (37.4 C)] 99.4 F (37.4 C) (06/07 0645) Pulse Rate:  [96-118] 99 (06/07 0646) Resp:  [12-31] 18 (06/07 0646) BP: (121-163)/(64-87) 138/69 (06/07 0646) SpO2:  [90 %-99 %] 95 % (06/07 0646) Last BM Date: 12/13/20  Intake/Output from previous day: 06/06 0701 - 06/07 0700 In: 2516.6 [P.O.:200; I.V.:1801.6; Blood:315; IV Piggyback:200] Out: 1600 [Urine:1600] Intake/Output this shift: No intake/output data recorded.  General appearance: alert and cooperative Resp: clear to auscultation bilaterally Cardio: regular rate and rhythm GI: soft, min tend LUQ, some dist Extremities: calves soft Neurologic: Mental status: Alert, oriented, thought content appropriate  Lab Results: CBC  Recent Labs    12/15/20 1224 12/16/20 0433  WBC 8.1 6.3  HGB 7.4* 6.9*  HCT 22.7* 21.2*  PLT 114* 125*   BMET Recent Labs    12/15/20 0448 12/16/20 0433  NA 138 137  K 4.6 3.7  CL 102 100  CO2 28 31  GLUCOSE 135* 104*  BUN 25* 15  CREATININE 1.26* 1.03  CALCIUM 8.4* 8.3*   PT/INR No results for input(s): LABPROT, INR in the last 72 hours. ABG No results for input(s): PHART, HCO3 in the last 72 hours.  Invalid input(s): PCO2, PO2  Studies/Results: No results found.  Anti-infectives: Anti-infectives (From admission, onward)   None      Assessment/Plan: [facility]  Spleen laceration - Bedrest, Hb 6.9 got 1u PRBC > 7.4, now 6.9 again and getting PRBC now. In light of ongoing ooze, I consulted IR for angioembolization. I spoke with him and his family about this. CRT 1 this AM. L clavicle fx - per Dr. Griffin Basil, holding off on surgery P stability AKI - improved FEN - NPO for procedure, lytes OK DVT - SCDs, hold LMWH until hgb stability Dispo - SDU, angio  as above   LOS: 3 days    Georganna Skeans, MD, MPH, FACS Trauma & General Surgery Use [email or web address] to contact on call provider  12/16/2020

## 2020-12-16 NOTE — Procedures (Signed)
  Procedure: Splenic arteriogram and embolization flow to lower pole preserved EBL:   minimal Complications:  none immediate  See full dictation in [facility].  Dillard Cannon MD Main # [phone] Pager  [phone] Mobile [phone]

## 2020-12-16 NOTE — Progress Notes (Signed)
Primary RN called this RN to inform me of patients critical hemoglobin and may be needing another unit of blood. Primary RN notified that she will need to relay this directly to the trauma provider on-call. Primary RN informed that it was Dr. Donne Hazel.

## 2020-12-16 NOTE — Sedation Documentation (Signed)
5 fr exoseal deployed at 0958, right groin

## 2020-12-16 NOTE — H&P (Signed)
Chief Complaint: Patient was seen in consultation today for splenic embolization.  Referring Physician(s): Georganna Skeans  Supervising Physician: Arne Cleveland  Patient Status: [facility] - In-pt  History of Present Illness: Clarence Alvarez is a 55 y.o. male with a past medical history significant for OSA, asthma, HTN and DM who presented to [facility] ED on 12/12/20 for left shoulder pain s/p mechanical fall at work. He was evaluated and found to have a left clavicle fracture. He was discharged home with outpatient follow up, however the following evening he experienced a syncopal episode and returned to the ED where he was found to have multiple rib fractures and splenic laceration with hemoperitoneum. He was admitted to the trauma service with plans for close monitoring as his hemoglobin was stable. Over the next several days his hemoglobin dropped to 6.9 and he was transfused 1 unit of pRBCs which increased his hemoglobin to 7.4, however at next H/H check his hemoglobin was again noted to be 6.9. IR has been consulted for splenic embolization due to likely ongoing splenic bleeding.  Mr. Pouliot reports significant abdominal pain and trouble breathing. He also has a lot of left shoulder pain. He is hopeful that the procedure today can get him feeling better and let him be able to have surgery on his shoulder. He understands the procedure today and is agreeable to proceed.  Past Medical History:  Diagnosis Date  . Allergic rhinitis, seasonal   . Anterior displaced fracture of sternal end of left clavicle, initial encounter for closed fracture 12/12/2020  . Arthritis    right knee and both shoulders and bursisits  . Concussion age 75   no residual  . GERD (gastroesophageal reflux disease)   . History of adenomatous polyp of colon    03-25-2014  tubular adenoma's /  hyperplastic  . History of hiatal hernia   . History of kidney stones October 02, 2016   passed on own  . Hypertension   . Left ureteral stone     02-Oct-2016  . Mild obstructive sleep apnea    per study 10-02-2012, pt states he does not have sleep apnea  . Moderate persistent asthma    followed by pcp-- uncomplicated  . MRSA infection 02-Oct-2008   buttocks  . Pneumonia x 3 last time 2009/10/02  . Pre-diabetes   . Sleep apnea    no CPAP used  . Traumatic complete tear of right rotator cuff 11/30/2018  . Wears dentures    full  . Wears glasses     Past Surgical History:  Procedure Laterality Date  . [place] STUDY N/A 05/04/2018   Procedure: [place] STUDY;  Surgeon: Jonathon Bellows, MD;  Location: [facility] ENDOSCOPY;  Service: Gastroenterology;  Laterality: N/A;  . anterior cervucal  10-03-2015   with fusion  . COLONOSCOPY  03/25/2014 , 10-02-17  . ESOPHAGEAL MANOMETRY N/A 05/04/2018   Procedure: ESOPHAGEAL MANOMETRY (EM);  Surgeon: Jonathon Bellows, MD;  Location: [facility] ENDOSCOPY;  Service: Gastroenterology;  Laterality: N/A;  . ESOPHAGOGASTRODUODENOSCOPY (EGD) WITH PROPOFOL N/A 02/24/2018   Procedure: ESOPHAGOGASTRODUODENOSCOPY (EGD) WITH PROPOFOL;  Surgeon: Jonathon Bellows, MD;  Location: [facility] ENDOSCOPY;  Service: Gastroenterology;  Laterality: N/A;  . KNEE SURGERY Right 10/02/13  . RESECTION DISTAL CLAVICAL Right 12/06/2018   Procedure: RESECTION DISTAL CLAVICAL AND DEBRIDEMENT OF THE SHOULDER;  Surgeon: Elsie Saas, MD;  Location: [place];  Service: Orthopedics;  Laterality: Right;  SCALENE BLOCK  . right thumb surgery  10-02-2017   trigger finger release  .  SHOULDER ARTHROSCOPY WITH ROTATOR CUFF REPAIR AND SUBACROMIAL DECOMPRESSION Left 07-24-2010  dr Noemi Chapel @  [facility]   and Labrum tear debridement  . SHOULDER ARTHROSCOPY WITH ROTATOR CUFF REPAIR AND SUBACROMIAL DECOMPRESSION Right 12/06/2018   Procedure: SHOULDER ARTHROSCOPY WITH ROTATOR CUFF REPAIR AND SUBACROMIAL DECOMPRESSION;  Surgeon: Elsie Saas, MD;  Location: [place];  Service: Orthopedics;  Laterality: Right;  SCALENE BLOCK  . UPPER GASTROINTESTINAL ENDOSCOPY  2019     Allergies: Lactose intolerance (gi) and Latex  Medications: Prior to Admission medications   Medication Sig Start Date End Date Taking? Authorizing Provider  albuterol (PROVENTIL) (2.5 MG/3ML) 0.083% nebulizer solution Take 3 mLs (2.5 mg total) by nebulization every 6 (six) hours as needed for wheezing or shortness of breath. 09/28/19  Yes Ria Bush, MD  albuterol (VENTOLIN HFA) 108 (90 Base) MCG/ACT inhaler INHALE 2 PUFFS BY MOUTH 4 TIMES A DAY Patient taking differently: Inhale 2 puffs into the lungs every 6 (six) hours as needed for wheezing or shortness of breath. 10/22/19  Yes Copland, Frederico Hamman, MD  amLODipine (NORVASC) 5 MG tablet TAKE 1 TABLET BY MOUTH EVERY DAY Patient taking differently: Take 5 mg by mouth daily. 11/11/20  Yes Copland, Frederico Hamman, MD  cetirizine (ZYRTEC) 10 MG tablet Take 10 mg by mouth every morning.    Yes [provider]  cyclobenzaprine (FLEXERIL) 10 MG tablet TAKE 0.5-1 TABLETS (5-10 MG TOTAL) BY MOUTH AT BEDTIME AS NEEDED FOR MUSCLE SPASMS. Patient taking differently: Take 10 mg by mouth at bedtime as needed for muscle spasms. 11/19/20 11/19/21 Yes Copland, Frederico Hamman, MD  FLOVENT HFA 110 MCG/ACT inhaler INHALE 1 PUFF BY MOUTH TWICE A DAY Patient taking differently: Inhale 1 puff into the lungs in the morning and at bedtime. 05/07/19  Yes Copland, Frederico Hamman, MD  fluticasone (FLONASE) 50 MCG/ACT nasal spray SPRAY 2 SPRAYS INTO EACH NOSTRIL EVERY DAY Patient taking differently: Place 2 sprays into both nostrils daily. 05/20/20  Yes Copland, Frederico Hamman, MD  indomethacin (INDOCIN) 50 MG capsule Take 1 capsule (50 mg total) by mouth 3 (three) times daily with meals. Patient taking differently: Take 50 mg by mouth 2 (two) times daily with a meal. 08/21/20  Yes Copland, Frederico Hamman, MD  ketorolac (TORADOL) 60 MG/2ML SOLN injection Inject 60 mg into the muscle once. 12/12/20  Yes [provider]  methocarbamol (ROBAXIN) 750 MG tablet Take 750 mg by mouth in the  morning and at bedtime. 12/12/20  Yes [provider]  omeprazole (PRILOSEC) 20 MG capsule TAKE 1 CAPSULE BY MOUTH EVERY DAY Patient taking differently: Take 20 mg by mouth daily. 04/05/20  Yes Copland, Frederico Hamman, MD  OVER THE COUNTER MEDICATION Take 1 tablet by mouth daily.   Yes [provider]  oxyCODONE (ROXICODONE) 5 MG immediate release tablet Take 1 tablet (5 mg total) by mouth every 6 (six) hours as needed for moderate pain or severe pain. 12/12/20  Yes Tacy Learn, PA-C  sildenafil (VIAGRA) 100 MG tablet Take 0.5-1 tablets (50-100 mg total) by mouth daily as needed for erectile dysfunction. Patient not taking: Reported on 12/13/2020 05/18/20   Owens Loffler, MD  albuterol (PROVENTIL,VENTOLIN) 90 MCG/ACT inhaler Inhale 2 puffs into the lungs every 6 (six) hours as needed for wheezing. 02/15/11 11/30/18  Katherina Mires, MD     Family History  Problem Relation Age of Onset  . Diabetes Maternal Grandmother   . Colon cancer Maternal Grandmother        dx age 68 - recurance at age 81  .  Diabetes Mother   . Hepatitis C Mother   . Prostate cancer Father   . Colon cancer Father        dx - 51's  . Diabetes Paternal Grandmother   . Mental illness Brother   . Mental illness Sister   . Mental illness Daughter   . Stomach cancer Maternal Grandfather   . COPD Other        Aunt  . Diabetes Other        siblings  . Pancreatic cancer Paternal Aunt   . Esophageal cancer Neg Hx   . Rectal cancer Neg Hx     Social History   Socioeconomic History  . Marital status: Married    Spouse name: Not on file  . Number of children: Not on file  . Years of education: Not on file  . Highest education level: Not on file  Occupational History  . Occupation: facility and Dealer: [employer]    Comment: Head maintenance at [employer]  . Smoking status: Former Smoker    Years: 3.00    Types: Cigarettes    Quit date: 07/12/1984    Years  since quitting: 36.4  . Smokeless tobacco: Former Systems developer    Types: Snuff    Quit date: 2017  . Tobacco comment: "EXPERIMENTED" W/SMOKING--NOT EVEN A PACK X 1 WEEK.   Vaping Use  . Vaping Use: Never used  Substance and Sexual Activity  . Alcohol use: Not Currently    Alcohol/week: 3.0 standard drinks    Types: 3 Shots of liquor per week  . Drug use: No  . Sexual activity: Not on file    Comment: vasectomy-- 2010  Other Topics Concern  . Not on file  Social History Narrative  . Not on file   Social Determinants of Health   Financial Resource Strain: Not on file  Food Insecurity: Not on file  Transportation Needs: Not on file  Physical Activity: Not on file  Stress: Not on file  Social Connections: Not on file     Review of Systems: A 12 point ROS discussed and pertinent positives are indicated in the HPI above.  All other systems are negative.  Review of Systems  Constitutional: Negative for chills and fever.  Respiratory: Positive for shortness of breath. Negative for cough.   Cardiovascular: Negative for chest pain.  Gastrointestinal: Positive for abdominal pain and nausea. Negative for diarrhea and vomiting.  Musculoskeletal: Negative for back pain.       (+) left shoulder pain  Neurological: Negative for dizziness and headaches.    Vital Signs: BP 130/65 (BP Location: Left Arm)   Pulse 98   Temp 98.6 F (37 C) (Oral)   Resp 17   Ht 5' 11.5" (1.816 m)   Wt 255 lb 8.2 oz (115.9 kg)   SpO2 98%   BMI 35.14 kg/m   Physical Exam Vitals and nursing note reviewed.  Constitutional:      General: He is not in acute distress. HENT:     Head: Normocephalic.     Mouth/Throat:     Mouth: Mucous membranes are moist.     Pharynx: Oropharynx is clear. No oropharyngeal exudate or posterior oropharyngeal erythema.  Cardiovascular:     Rate and Rhythm: Normal rate and regular rhythm.  Pulmonary:     Effort: Pulmonary effort is normal.     Breath sounds: Normal breath  sounds.     Comments: (+) supplement  O2 via [place] Abdominal:     General: There is distension.     Tenderness: There is abdominal tenderness.  Skin:    General: Skin is warm and dry.  Neurological:     Mental Status: He is alert and oriented to person, place, and time.  Psychiatric:        Mood and Affect: Mood normal.        Behavior: Behavior normal.        Thought Content: Thought content normal.        Judgment: Judgment normal.      MD Evaluation Airway: WNL Heart: WNL Abdomen: Other (comments) Abdomen comments: hemoperitoneum Chest/ Lungs: WNL ASA  Classification: 3 Mallampati/Airway Score: One   Imaging: DG Chest 2 View  Result Date: 12/13/2020 CLINICAL DATA:  Pain. Status post fall at work. Recent clavicle fracture. EXAM: CHEST - 2 VIEW COMPARISON:  X-ray clavicle 12/12/2020, chest x-ray 08/14/2017 FINDINGS: The heart size and mediastinal contours are within normal limits. Bibasilar streaky airspace opacities likely atelectasis. No focal consolidation. Limited evaluation on lateral view due to overlying arm. No pulmonary edema. No pleural effusion. No pneumothorax. Redemonstration of acute comminuted proximal to mid left clavicular fracture. Cervical surgical hardware. IMPRESSION: 1. No definite active cardiopulmonary disease with markedly limited evaluation on lateral view due to overlying arm. 2. Acute comminuted proximal to mid left clavicular fracture. Electronically Signed   By: Iven Finn M.D.   On: 12/13/2020 02:23   DG Clavicle Left  Result Date: 12/12/2020 CLINICAL DATA:  55 year old male status post fall with pain. EXAM: LEFT CLAVICLE - 2+ VIEWS COMPARISON:  Left shoulder series today reported separately. Chest radiographs 08/14/2017. FINDINGS: Chronic lower cervical ACDF. Comminuted fracture of the left midclavicle with mildly displaced butterfly fragments, over riding, 1/2 shaft width inferior displacement, and inferior angulation of the distal fragment. The  acromioclavicular and coracoclavicular intervals appear to remain normal. Negative visible left lung apex. IMPRESSION: Comminuted left midclavicle fracture. Electronically Signed   By: Genevie Ann M.D.   On: 12/12/2020 09:51   CT Head Wo Contrast  Result Date: 12/13/2020 CLINICAL DATA:  Syncope.  Left rib pain. EXAM: CT HEAD WITHOUT CONTRAST CT CERVICAL SPINE WITHOUT CONTRAST TECHNIQUE: Multidetector CT imaging of the head and cervical spine was performed following the standard protocol without intravenous contrast. Multiplanar CT image reconstructions of the cervical spine were also generated. COMPARISON:  CT head 03/15/2018 FINDINGS: CT HEAD FINDINGS Brain: No evidence of large-territorial acute infarction. No parenchymal hemorrhage. No mass lesion. No extra-axial collection. No mass effect or midline shift. No hydrocephalus. Basilar cisterns are patent. Vascular: No hyperdense vessel. Atherosclerotic calcifications are present within the cavernous internal carotid arteries. Skull: No acute fracture or focal lesion. Sinuses/Orbits: Paranasal sinuses and mastoid air cells are clear. The orbits are unremarkable. Other: None. CT CERVICAL SPINE FINDINGS Alignment: Normal. Skull base and vertebrae: Anterior and interbody fusion of the C7-T1 levels. Multilevel degenerative changes of the spine most prominent at the C5-C6 level. No acute fracture. No aggressive appearing focal osseous lesion or focal pathologic process. Soft tissues and spinal canal: No prevertebral fluid or swelling. No visible canal hematoma. Upper chest: No definite pneumothorax. Other: Nondisplaced acute fracture of the left posterior second rib (7:33, 6:97, 8:48). Question nondisplaced acute fracture of the posterior left third rib (8:57, 7:42). IMPRESSION: 1. Nondisplaced acute fracture of the left posterior second rib. Question nondisplaced acute fracture of the posterior left third rib. No definite associated pneumothorax. 2.  No acute  intracranial abnormality.  3. No acute displaced fracture or traumatic listhesis of the cervical spine. Electronically Signed   By: Iven Finn M.D.   On: 12/13/2020 02:34   CT Cervical Spine Wo Contrast  Result Date: 12/13/2020 CLINICAL DATA:  Syncope.  Left rib pain. EXAM: CT HEAD WITHOUT CONTRAST CT CERVICAL SPINE WITHOUT CONTRAST TECHNIQUE: Multidetector CT imaging of the head and cervical spine was performed following the standard protocol without intravenous contrast. Multiplanar CT image reconstructions of the cervical spine were also generated. COMPARISON:  CT head 03/15/2018 FINDINGS: CT HEAD FINDINGS Brain: No evidence of large-territorial acute infarction. No parenchymal hemorrhage. No mass lesion. No extra-axial collection. No mass effect or midline shift. No hydrocephalus. Basilar cisterns are patent. Vascular: No hyperdense vessel. Atherosclerotic calcifications are present within the cavernous internal carotid arteries. Skull: No acute fracture or focal lesion. Sinuses/Orbits: Paranasal sinuses and mastoid air cells are clear. The orbits are unremarkable. Other: None. CT CERVICAL SPINE FINDINGS Alignment: Normal. Skull base and vertebrae: Anterior and interbody fusion of the C7-T1 levels. Multilevel degenerative changes of the spine most prominent at the C5-C6 level. No acute fracture. No aggressive appearing focal osseous lesion or focal pathologic process. Soft tissues and spinal canal: No prevertebral fluid or swelling. No visible canal hematoma. Upper chest: No definite pneumothorax. Other: Nondisplaced acute fracture of the left posterior second rib (7:33, 6:97, 8:48). Question nondisplaced acute fracture of the posterior left third rib (8:57, 7:42). IMPRESSION: 1. Nondisplaced acute fracture of the left posterior second rib. Question nondisplaced acute fracture of the posterior left third rib. No definite associated pneumothorax. 2.  No acute intracranial abnormality. 3. No acute displaced  fracture or traumatic listhesis of the cervical spine. Electronically Signed   By: Iven Finn M.D.   On: 12/13/2020 02:34   DG Shoulder Left  Result Date: 12/12/2020 CLINICAL DATA:  Fall.  Shoulder pain EXAM: LEFT SHOULDER - 2+ VIEW COMPARISON:  None FINDINGS: Midshaft LEFT clavicle fracture with caudal angulation. Mild comminution at the fracture plane. LEFT shoulder joint tact.  Anterior cervical fusion noted IMPRESSION: Midshaft LEFT clavicle fracture. Electronically Signed   By: Suzy Bouchard M.D.   On: 12/12/2020 09:53   CT Angio Chest/Abd/Pel for Dissection W and/or Wo Contrast  Result Date: 12/13/2020 CLINICAL DATA:  c/o syncope and L rib pain. Pt broke his clavicle today from a fall at work and is scheduled for surgery on Wed EXAM: CT ANGIOGRAPHY CHEST, ABDOMEN AND PELVIS TECHNIQUE: Non-contrast CT of the chest was initially obtained. Multidetector CT imaging through the chest, abdomen and pelvis was performed using the standard protocol during bolus administration of intravenous contrast. Multiplanar reconstructed images and MIPs were obtained and reviewed to evaluate the vascular anatomy. CONTRAST:  160mL OMNIPAQUE IOHEXOL 350 MG/ML SOLN COMPARISON:  CT abdomen pelvis 09/08/2017 FINDINGS: Markedly limited evaluation of the thorax and upper abdomen due to streak artifact originating from the left upper extremity along the patient's side. CTA CHEST FINDINGS Cardiovascular: Preferential opacification of the pulmonary artery with no pulmonary embolus. Normal heart size. No significant pericardial effusion. The thoracic aorta is normal in caliber. No definite thoracic aorta injury. No atherosclerotic plaque of the thoracic aorta. No coronary artery calcifications. Mediastinum/Nodes: No pneumomediastinum. No mediastinal hematoma. No enlarged mediastinal, hilar, or axillary lymph nodes. Thyroid gland, trachea, and esophagus demonstrate no significant findings. Lungs/Pleura: No focal consolidation.  No pulmonary nodule. No pulmonary mass. Bilateral lower lobe atelectasis. No pulmonary contusion or laceration. Trace left pleural fluid. No definite left pneumothorax with a possible foci of  gas along the pleura noted posteriorly (7:15). No right pleural fluid. No right pneumothorax. Musculoskeletal: Partially visualized comminuted mid to distal clavicular fracture. Acute fracture of the left posterior third nondisplaced, posterior fourth nondisplaced, posterior fifth nondisplaced, anterolateral fifth displaced, posterolateral sixth displaced, posterior sixth nondisplaced, posterolateral seventh displaced, posterior seventh nondisplaced, comminuted posterior eighth, posterolateral eighth minimally displaced, posterior ninth nondisplaced, posterior tenth minimally displaced. Review of the MIP images confirms the above findings. CTA ABDOMEN AND PELVIS FINDINGS VASCULAR Aorta: Normal caliber aorta without aneurysm, dissection, vasculitis or significant stenosis. Celiac: Patent without evidence of aneurysm, dissection, vasculitis or significant stenosis. SMA: Patent without evidence of aneurysm, dissection, vasculitis or significant stenosis. Renals: Both renal arteries are patent without evidence of aneurysm, dissection, vasculitis, fibromuscular dysplasia or significant stenosis. IMA: Patent without evidence of aneurysm, dissection, vasculitis or significant stenosis. Inflow: Patent without evidence of aneurysm, dissection, vasculitis or significant stenosis. Veins: No obvious venous abnormality within the limitations of this arterial phase study. Review of the MIP images confirms the above findings. NON-VASCULAR Diaphragm: Liely left hemidiaphragmatic injury. Hepatobiliary: No focal liver abnormality is seen. No gallstones, gallbladder wall thickening, or biliary dilatation. Markedly limited evaluation for hepatic injury due to timing of contrast. Pancreas: No focal lesion. Normal pancreatic contour. No surrounding  inflammatory changes. No main pancreatic ductal dilatation. Spleen: The splenic parenchyma is not well visualized with poorly defined splenic borders. Markedly limited evaluation due to timing of contrast and streak artifact originating from the left upper extremity. Normal in size without focal abnormality. Adrenals/Urinary Tract: No adrenal nodule bilaterally. No nephrolithiasis, no hydronephrosis, and no contour-deforming renal mass. No ureterolithiasis or hydroureter. The urinary bladder is unremarkable. Stomach/Bowel: Stomach is within normal limits. No evidence of bowel wall thickening or dilatation. Appendix appears normal. Lymphatic: No lymphadenopathy. Reproductive: Prostate is unremarkable. Other: At least moderate volume hemoperitoneum with both perisplenic and perihepatic fluid noted. Question slightly higher density fluid surrounding the spleen. Perisplenic fat stranding. No intraperitoneal free gas. No organized fluid collection. Musculoskeletal: No abdominal wall hernia or abnormality. No suspicious lytic or blastic osseous lesions. No acute displaced fracture. Multilevel degenerative changes of the spine. Review of the MIP images confirms the above findings. IMPRESSION: 1. Splenic injury with perisplenic hemorrhage and moderate volume hemoperitoneum. Possible shattered spleen with markedly limited evaluation due to timing of contrast and streak artifact originating from the left upper extremity. Unable to evaluate for active bleeding on this arterial phase only study of the abdomen/pelvis. 2. Trace left pleural fluid with limited evaluation due to streak artifact. No definite left pneumothorax with a possible foci of gas along the pleura posteriorly. 3. Associated multiple consecutive left rib fractures (4-10th) with several ribs broken in a couple of different places including the 5th, 6th, 7th, and 8th ribs. 4. Partially visualized comminuted mid to distal left clavicular fracture. 5. Likely left  hemidiaphragmatic injury. 6. A hepatic injury cannot be excluded due to timing of contrast. These results were called by telephone at the time of interpretation on 12/13/2020 at 5:58 am to provider [facility] , who verbally acknowledged these results. Electronically Signed   By: Iven Finn M.D.   On: 12/13/2020 06:16    Labs:  CBC: Recent Labs    12/14/20 1335 12/15/20 0448 12/15/20 1224 12/16/20 0433  WBC 9.0 8.8 8.1 6.3  HGB 7.8* 6.9* 7.4* 6.9*  HCT 23.8* 21.5* 22.7* 21.2*  PLT 133* 123* 114* 125*    COAGS: Recent Labs    12/13/20 0630  INR 1.3*  APTT 28  BMP: Recent Labs    12/14/20 0141 12/14/20 0635 12/15/20 0448 12/16/20 0433  NA 134* 137 138 137  K 6.5* 5.5* 4.6 3.7  CL 105 106 102 100  CO2 21* 22 28 31   GLUCOSE 165* 143* 135* 104*  BUN 36* 39* 25* 15  CALCIUM 8.1* 8.6* 8.4* 8.3*  CREATININE 2.42* 2.21* 1.26* 1.03  GFRNONAA 31* 34* >60 >60    LIVER FUNCTION TESTS: Recent Labs    02/19/20 0806 12/13/20 0221 12/14/20 0141  BILITOT 0.4 0.7 0.8  AST 36 46* 37  ALT 36 35 29  ALKPHOS 56 59 48  PROT 7.4 6.5 5.8*  ALBUMIN 4.3 3.7 3.4*    TUMOR MARKERS: No results for input(s): AFPTM, CEA, CA199, CHROMGRNA in the last 8760 hours.  Assessment and Plan:  54 y/o M who presented to the ED initially on 6/3 after mechanical fall at home and discharged with outpatient follow up for left clavicle fracture. He experienced a syncopal episode the following evening and returned to the ED where he was found to have multiple rib fractures and a splenic laceration. His hgb has continued to downtrend despite 2 units pRBCs so far, IR has been consulted for splenic embolization. Patient history and imaging reviewed by Dr. Vernard Gambles who agrees to procedure today.   Risks and benefits of splenic arteriogram with intervention were discussed with the patient including, but not limited to bleeding, infection, vascular injury, contrast induced renal failure, stroke,  reperfusion hemorrhage, or even death.  This interventional procedure involves the use of X-rays and because of the nature of the planned procedure, it is possible that we will have prolonged use of X-ray fluoroscopy. Potential radiation risks to you include (but are not limited to) the following: - A slightly elevated risk for cancer  several years later in life. This risk is typically less than 0.5% percent. This risk is low in comparison to the normal incidence of human cancer, which is 33% for women and 50% for men according to the [place]. - Radiation induced injury can include skin redness, resembling a rash, tissue breakdown / ulcers and hair loss (which can be temporary or permanent).  The likelihood of either of these occurring depends on the difficulty of the procedure and whether you are sensitive to radiation due to previous procedures, disease, or genetic conditions.  IF your procedure requires a prolonged use of radiation, you will be notified and given written instructions for further action.  It is your responsibility to monitor the irradiated area for the 2 weeks following the procedure and to notify your physician if you are concerned that you have suffered a radiation induced injury.    All of the patient's questions were answered, patient is agreeable to proceed.  Consent signed and in chart.  Thank you for this interesting consult.  I greatly enjoyed meeting SLOAN TAKAGI and look forward to participating in their care.  A copy of this report was sent to the requesting provider on this date.  Electronically Signed: Joaquim Nam, PA-C 12/16/2020, 8:43 AM   I spent a total of 20 Minutes  in face to face in clinical consultation, greater than 50% of which was counseling/coordinating care for splenic laceration embolization.

## 2020-12-16 NOTE — Progress Notes (Addendum)
5:17 Received call from Dr. Donne Hazel to infuse 1 unit of RBC's and to make pt NPO.  Paged Dr. Donne Hazel about critical hgb 6.9.

## 2020-12-16 NOTE — Progress Notes (Signed)
Patient seen this morning.  Discussed his clavicle injury again.  Receiving 2u PRBC due to Hb drop overnight.  Will likely hold on surgery for clavicle until more stable Hb.  May need to enlist the assistance of our Orthopedic Trauma team due to surgical availability.    All questions answered.  Patient understands the plan of care from an orthopedic standpoint.

## 2020-12-16 NOTE — Sedation Documentation (Signed)
Bedside report given to [facility], RN on 4N. Patient's groin site is level 0, gauze clean/dry/intact, pulse +2. Patient alert and vitals stable.

## 2020-12-17 ENCOUNTER — Ambulatory Visit (HOSPITAL_BASED_OUTPATIENT_CLINIC_OR_DEPARTMENT_OTHER)
Admission: RE | Admit: 2020-12-17 | Payer: Commercial Managed Care - PPO | Source: Home / Self Care | Admitting: Orthopaedic Surgery

## 2020-12-17 HISTORY — DX: Prediabetes: R73.03

## 2020-12-17 HISTORY — DX: Personal history of other diseases of the digestive system: Z87.19

## 2020-12-17 HISTORY — DX: Presence of dental prosthetic device (complete) (partial): Z97.2

## 2020-12-17 HISTORY — DX: Pneumonia, unspecified organism: J18.9

## 2020-12-17 HISTORY — DX: Concussion with loss of consciousness of unspecified duration, initial encounter: S06.0X9A

## 2020-12-17 HISTORY — DX: Presence of spectacles and contact lenses: Z97.3

## 2020-12-17 LAB — TYPE AND SCREEN
ABO/RH(D): A POS
Antibody Screen: NEGATIVE
Unit division: 0
Unit division: 0

## 2020-12-17 LAB — CBC
HCT: 26.6 % — ABNORMAL LOW (ref 39.0–52.0)
Hemoglobin: 8.9 g/dL — ABNORMAL LOW (ref 13.0–17.0)
MCH: 30.9 pg (ref 26.0–34.0)
MCHC: 33.5 g/dL (ref 30.0–36.0)
MCV: 92.4 fL (ref 80.0–100.0)
Platelets: 149 10*3/uL — ABNORMAL LOW (ref 150–400)
RBC: 2.88 MIL/uL — ABNORMAL LOW (ref 4.22–5.81)
RDW: 13 % (ref 11.5–15.5)
WBC: 8 10*3/uL (ref 4.0–10.5)
nRBC: 0.5 % — ABNORMAL HIGH (ref 0.0–0.2)

## 2020-12-17 LAB — BASIC METABOLIC PANEL
Anion gap: 7 (ref 5–15)
BUN: 11 mg/dL (ref 6–20)
CO2: 31 mmol/L (ref 22–32)
Calcium: 8.3 mg/dL — ABNORMAL LOW (ref 8.9–10.3)
Chloride: 99 mmol/L (ref 98–111)
Creatinine, Ser: 0.91 mg/dL (ref 0.61–1.24)
GFR, Estimated: >60 mL/min (ref >60)
Glucose, Bld: 118 mg/dL — ABNORMAL HIGH (ref 70–99)
Potassium: 3.8 mmol/L (ref 3.5–5.1)
Sodium: 137 mmol/L (ref 135–145)

## 2020-12-17 LAB — BPAM RBC
Blood Product Expiration Date: 202206222359
Blood Product Expiration Date: 202206242359
ISSUE DATE / TIME: 202206060744
ISSUE DATE / TIME: 202206070620
Unit Type and Rh: 6200
Unit Type and Rh: 6200

## 2020-12-17 SURGERY — OPEN REDUCTION INTERNAL FIXATION (ORIF) CLAVICULAR FRACTURE
Anesthesia: Choice | Laterality: Left

## 2020-12-17 MED ORDER — PANTOPRAZOLE SODIUM 40 MG PO TBEC
40.0000 mg | DELAYED_RELEASE_TABLET | Freq: Every day | ORAL | Status: DC
  Administered 2020-12-18 – 2020-12-20 (×2): 40 mg via ORAL
  Filled 2020-12-17 (×3): qty 1

## 2020-12-17 MED ORDER — IBUPROFEN 200 MG PO TABS
400.0000 mg | ORAL_TABLET | Freq: Three times a day (TID) | ORAL | Status: DC | PRN
  Administered 2020-12-17: 400 mg via ORAL
  Filled 2020-12-17: qty 2

## 2020-12-17 NOTE — Progress Notes (Signed)
Referring Physician(s): Dr Dennis Bast  Supervising Physician: Ruthann Cancer  Patient Status:  [facility] - In-pt  Chief Complaint:  Splenic laceration post fall  Subjective:  12/16/20--Procedure: Splenic arteriogram and embolization flow to lower pole preserved EBL:  minimal Complications:  none immediate  Doing well this am Up in bed Belly is sore--- some distension Hg 8.9 this am 1 unit 6/6 1 unit 6/7 Reg diet   CCS note today:  Spleen laceration -  Had ongoing ooze so he underwent angioembolization 6/7 by Dr. Jarvis Newcomer. He still has a good amount of perfused spleen so he does not need vaccines. L clavicle fx - per Dr. Griffin Basil, holding off on surgery P stability  Allergies: Lactose intolerance (gi) and Latex  Medications: Prior to Admission medications   Medication Sig Start Date End Date Taking? Authorizing Provider  albuterol (PROVENTIL) (2.5 MG/3ML) 0.083% nebulizer solution Take 3 mLs (2.5 mg total) by nebulization every 6 (six) hours as needed for wheezing or shortness of breath. 09/28/19  Yes Ria Bush, MD  albuterol (VENTOLIN HFA) 108 (90 Base) MCG/ACT inhaler INHALE 2 PUFFS BY MOUTH 4 TIMES A DAY Patient taking differently: Inhale 2 puffs into the lungs every 6 (six) hours as needed for wheezing or shortness of breath. 10/22/19  Yes Copland, Frederico Hamman, MD  amLODipine (NORVASC) 5 MG tablet TAKE 1 TABLET BY MOUTH EVERY DAY Patient taking differently: Take 5 mg by mouth daily. 11/11/20  Yes Copland, Frederico Hamman, MD  cetirizine (ZYRTEC) 10 MG tablet Take 10 mg by mouth every morning.    Yes [provider]  cyclobenzaprine (FLEXERIL) 10 MG tablet TAKE 0.5-1 TABLETS (5-10 MG TOTAL) BY MOUTH AT BEDTIME AS NEEDED FOR MUSCLE SPASMS. Patient taking differently: Take 10 mg by mouth at bedtime as needed for muscle spasms. 11/19/20 11/19/21 Yes Copland, Frederico Hamman, MD  FLOVENT HFA 110 MCG/ACT inhaler INHALE 1 PUFF BY MOUTH TWICE A DAY Patient taking differently: Inhale 1 puff  into the lungs in the morning and at bedtime. 05/07/19  Yes Copland, Frederico Hamman, MD  fluticasone (FLONASE) 50 MCG/ACT nasal spray SPRAY 2 SPRAYS INTO EACH NOSTRIL EVERY DAY Patient taking differently: Place 2 sprays into both nostrils daily. 05/20/20  Yes Copland, Frederico Hamman, MD  indomethacin (INDOCIN) 50 MG capsule Take 1 capsule (50 mg total) by mouth 3 (three) times daily with meals. Patient taking differently: Take 50 mg by mouth 2 (two) times daily with a meal. 08/21/20  Yes Copland, Frederico Hamman, MD  ketorolac (TORADOL) 60 MG/2ML SOLN injection Inject 60 mg into the muscle once. 12/12/20  Yes [provider]  methocarbamol (ROBAXIN) 750 MG tablet Take 750 mg by mouth in the morning and at bedtime. 12/12/20  Yes [provider]  omeprazole (PRILOSEC) 20 MG capsule TAKE 1 CAPSULE BY MOUTH EVERY DAY Patient taking differently: Take 20 mg by mouth daily. 04/05/20  Yes Copland, Frederico Hamman, MD  OVER THE COUNTER MEDICATION Take 1 tablet by mouth daily.   Yes [provider]  oxyCODONE (ROXICODONE) 5 MG immediate release tablet Take 1 tablet (5 mg total) by mouth every 6 (six) hours as needed for moderate pain or severe pain. 12/12/20  Yes Tacy Learn, PA-C  sildenafil (VIAGRA) 100 MG tablet Take 0.5-1 tablets (50-100 mg total) by mouth daily as needed for erectile dysfunction. Patient not taking: Reported on 12/13/2020 05/18/20   Owens Loffler, MD  albuterol (PROVENTIL,VENTOLIN) 90 MCG/ACT inhaler Inhale 2 puffs into the lungs every 6 (six) hours as needed for wheezing. 02/15/11 11/30/18  Katherina Mires, MD     Vital Signs: BP (!) 148/78 (BP Location: Right Arm)   Pulse 98   Temp 99 F (37.2 C) (Oral)   Resp 20   Ht 5' 11.5" (1.816 m)   Wt 255 lb 8.2 oz (115.9 kg)   SpO2 94%   BMI 35.14 kg/m   Physical Exam Vitals reviewed.  Abdominal:     General: Bowel sounds are normal. There is distension.     Tenderness: There is abdominal tenderness.  Skin:    General: Skin is warm.      Comments: Rt groin site is clean and dry NT no bleeding No hematoma  Rt foot 2+ pulses  Neurological:     Mental Status: He is alert and oriented to person, place, and time.  Psychiatric:        Behavior: Behavior normal.     Imaging: IR Angiogram Visceral Selective  Result Date: 12/16/2020 INDICATION: Recent fall with splenic laceration and hemorrhage. Continued blood loss. Embolization requested by trauma surgery service. EXAM: SPLENIC ARTERIOGRAM AND EMBOLIZATION ULTRASOUND GUIDANCE FOR VASCULAR ACCESS MEDICATIONS: no antibiotics indicated ANESTHESIA/SEDATION: Intravenous Fentanyl 53mcg and Versed 1.5mg  were administered as conscious sedation during continuous monitoring of the patient's level of consciousness and physiological / cardiorespiratory status by the radiology RN, with a total moderate sedation time of 45 minutes. PROCEDURE: Informed consent was obtained from the patient following explanation of the procedure, risks, benefits and alternatives. The patient understands, agrees and consents for the procedure. All questions were addressed. A time out was performed prior to the initiation of the procedure. Maximal barrier sterile technique utilized including caps, mask, sterile gowns, sterile gloves, large sterile drape, hand hygiene, and Betadine prep. Patency of the right common femoral artery confirmed with ultrasound. Under real-time ultrasound guidance, the vessel was accessed with a 21-gauge micropuncture needle, exchanged over a 018 guidewire for a transitional dilator, through which a 035 guidewire was advanced. Over this, a 5 French sheath placed. 5 French C2 catheter coaxially advanced and used to selectively catheterize the celiac axis for arteriography. Patent splenic artery identified. C2 catheter was advanced to the midportion of the splenic artery for selective arteriography. At least 3 sites of pseudoaneurysm/extravasation were evident from superior pole of the spleen. A  coaxial Renegade microcatheter was advanced for selective third-order catheterization of the superior branch of the splenic artery for arteriography. Subsequent embolization with 4 and 5 mm coils was performed. The microcatheter was used for selective arteriography of the inferior branch splenic artery. No aneurysm, pseudoaneurysm, extravasation, or other arterial abnormality was localized in the lower pole of the spleen. Microcatheter was removed and final splenic arteriogram through the 5 French catheter was performed. Catheter was removed. After confirmatory femoral arteriogram, sheath removed and hemostasis achieved with aid of ExoSeal device. The patient tolerated the procedure well. CONTRAST:  80 mL Omnipaque 300 IV FLUOROSCOPY TIME:  10 minutes 6 seconds; 329 mGy COMPLICATIONS: None immediate. IMPRESSION: 1. Technically successful coil embolization of superior branch splenic artery, with no further opacification of sites of pseudoaneurysm/hemorrhage. 2. Arteriography of the inferior pole of the spleen was normal, and perfusion via inferior branch of the splenic artery was preserved. I telephoned the results to Dr. Georganna Skeans at the conclusion of the procedure. Electronically Signed   By: Lucrezia Europe M.D.   On: 12/16/2020 12:34   IR Angiogram Selective Each Additional Vessel  Result Date: 12/16/2020 INDICATION: Recent fall with splenic laceration and hemorrhage. Continued blood loss. Embolization requested by  trauma surgery service. EXAM: SPLENIC ARTERIOGRAM AND EMBOLIZATION ULTRASOUND GUIDANCE FOR VASCULAR ACCESS MEDICATIONS: no antibiotics indicated ANESTHESIA/SEDATION: Intravenous Fentanyl 77mcg and Versed 1.5mg  were administered as conscious sedation during continuous monitoring of the patient's level of consciousness and physiological / cardiorespiratory status by the radiology RN, with a total moderate sedation time of 45 minutes. PROCEDURE: Informed consent was obtained from the patient following  explanation of the procedure, risks, benefits and alternatives. The patient understands, agrees and consents for the procedure. All questions were addressed. A time out was performed prior to the initiation of the procedure. Maximal barrier sterile technique utilized including caps, mask, sterile gowns, sterile gloves, large sterile drape, hand hygiene, and Betadine prep. Patency of the right common femoral artery confirmed with ultrasound. Under real-time ultrasound guidance, the vessel was accessed with a 21-gauge micropuncture needle, exchanged over a 018 guidewire for a transitional dilator, through which a 035 guidewire was advanced. Over this, a 5 French sheath placed. 5 French C2 catheter coaxially advanced and used to selectively catheterize the celiac axis for arteriography. Patent splenic artery identified. C2 catheter was advanced to the midportion of the splenic artery for selective arteriography. At least 3 sites of pseudoaneurysm/extravasation were evident from superior pole of the spleen. A coaxial Renegade microcatheter was advanced for selective third-order catheterization of the superior branch of the splenic artery for arteriography. Subsequent embolization with 4 and 5 mm coils was performed. The microcatheter was used for selective arteriography of the inferior branch splenic artery. No aneurysm, pseudoaneurysm, extravasation, or other arterial abnormality was localized in the lower pole of the spleen. Microcatheter was removed and final splenic arteriogram through the 5 French catheter was performed. Catheter was removed. After confirmatory femoral arteriogram, sheath removed and hemostasis achieved with aid of ExoSeal device. The patient tolerated the procedure well. CONTRAST:  80 mL Omnipaque 300 IV FLUOROSCOPY TIME:  10 minutes 6 seconds; 700 mGy COMPLICATIONS: None immediate. IMPRESSION: 1. Technically successful coil embolization of superior branch splenic artery, with no further  opacification of sites of pseudoaneurysm/hemorrhage. 2. Arteriography of the inferior pole of the spleen was normal, and perfusion via inferior branch of the splenic artery was preserved. I telephoned the results to Dr. Georganna Skeans at the conclusion of the procedure. Electronically Signed   By: Lucrezia Europe M.D.   On: 12/16/2020 12:34   IR Angiogram Follow Up Study  Result Date: 12/16/2020 INDICATION: Recent fall with splenic laceration and hemorrhage. Continued blood loss. Embolization requested by trauma surgery service. EXAM: SPLENIC ARTERIOGRAM AND EMBOLIZATION ULTRASOUND GUIDANCE FOR VASCULAR ACCESS MEDICATIONS: no antibiotics indicated ANESTHESIA/SEDATION: Intravenous Fentanyl 44mcg and Versed 1.5mg  were administered as conscious sedation during continuous monitoring of the patient's level of consciousness and physiological / cardiorespiratory status by the radiology RN, with a total moderate sedation time of 45 minutes. PROCEDURE: Informed consent was obtained from the patient following explanation of the procedure, risks, benefits and alternatives. The patient understands, agrees and consents for the procedure. All questions were addressed. A time out was performed prior to the initiation of the procedure. Maximal barrier sterile technique utilized including caps, mask, sterile gowns, sterile gloves, large sterile drape, hand hygiene, and Betadine prep. Patency of the right common femoral artery confirmed with ultrasound. Under real-time ultrasound guidance, the vessel was accessed with a 21-gauge micropuncture needle, exchanged over a 018 guidewire for a transitional dilator, through which a 035 guidewire was advanced. Over this, a 5 French sheath placed. 5 French C2 catheter coaxially advanced and used to selectively catheterize the  celiac axis for arteriography. Patent splenic artery identified. C2 catheter was advanced to the midportion of the splenic artery for selective arteriography. At least 3  sites of pseudoaneurysm/extravasation were evident from superior pole of the spleen. A coaxial Renegade microcatheter was advanced for selective third-order catheterization of the superior branch of the splenic artery for arteriography. Subsequent embolization with 4 and 5 mm coils was performed. The microcatheter was used for selective arteriography of the inferior branch splenic artery. No aneurysm, pseudoaneurysm, extravasation, or other arterial abnormality was localized in the lower pole of the spleen. Microcatheter was removed and final splenic arteriogram through the 5 French catheter was performed. Catheter was removed. After confirmatory femoral arteriogram, sheath removed and hemostasis achieved with aid of ExoSeal device. The patient tolerated the procedure well. CONTRAST:  80 mL Omnipaque 300 IV FLUOROSCOPY TIME:  10 minutes 6 seconds; 417 mGy COMPLICATIONS: None immediate. IMPRESSION: 1. Technically successful coil embolization of superior branch splenic artery, with no further opacification of sites of pseudoaneurysm/hemorrhage. 2. Arteriography of the inferior pole of the spleen was normal, and perfusion via inferior branch of the splenic artery was preserved. I telephoned the results to Dr. Georganna Skeans at the conclusion of the procedure. Electronically Signed   By: Lucrezia Europe M.D.   On: 12/16/2020 12:34   IR US Guide Vasc Access Right  Result Date: 12/16/2020 INDICATION: Recent fall with splenic laceration and hemorrhage. Continued blood loss. Embolization requested by trauma surgery service. EXAM: SPLENIC ARTERIOGRAM AND EMBOLIZATION ULTRASOUND GUIDANCE FOR VASCULAR ACCESS MEDICATIONS: no antibiotics indicated ANESTHESIA/SEDATION: Intravenous Fentanyl 60mcg and Versed 1.5mg  were administered as conscious sedation during continuous monitoring of the patient's level of consciousness and physiological / cardiorespiratory status by the radiology RN, with a total moderate sedation time of 45  minutes. PROCEDURE: Informed consent was obtained from the patient following explanation of the procedure, risks, benefits and alternatives. The patient understands, agrees and consents for the procedure. All questions were addressed. A time out was performed prior to the initiation of the procedure. Maximal barrier sterile technique utilized including caps, mask, sterile gowns, sterile gloves, large sterile drape, hand hygiene, and Betadine prep. Patency of the right common femoral artery confirmed with ultrasound. Under real-time ultrasound guidance, the vessel was accessed with a 21-gauge micropuncture needle, exchanged over a 018 guidewire for a transitional dilator, through which a 035 guidewire was advanced. Over this, a 5 French sheath placed. 5 French C2 catheter coaxially advanced and used to selectively catheterize the celiac axis for arteriography. Patent splenic artery identified. C2 catheter was advanced to the midportion of the splenic artery for selective arteriography. At least 3 sites of pseudoaneurysm/extravasation were evident from superior pole of the spleen. A coaxial Renegade microcatheter was advanced for selective third-order catheterization of the superior branch of the splenic artery for arteriography. Subsequent embolization with 4 and 5 mm coils was performed. The microcatheter was used for selective arteriography of the inferior branch splenic artery. No aneurysm, pseudoaneurysm, extravasation, or other arterial abnormality was localized in the lower pole of the spleen. Microcatheter was removed and final splenic arteriogram through the 5 French catheter was performed. Catheter was removed. After confirmatory femoral arteriogram, sheath removed and hemostasis achieved with aid of ExoSeal device. The patient tolerated the procedure well. CONTRAST:  80 mL Omnipaque 300 IV FLUOROSCOPY TIME:  10 minutes 6 seconds; 408 mGy COMPLICATIONS: None immediate. IMPRESSION: 1. Technically successful  coil embolization of superior branch splenic artery, with no further opacification of sites of pseudoaneurysm/hemorrhage. 2. Arteriography of  the inferior pole of the spleen was normal, and perfusion via inferior branch of the splenic artery was preserved. I telephoned the results to Dr. Georganna Skeans at the conclusion of the procedure. Electronically Signed   By: Lucrezia Europe M.D.   On: 12/16/2020 12:34   IR EMBO ART  VEN HEMORR LYMPH EXTRAV  INC GUIDE ROADMAPPING  Result Date: 12/16/2020 INDICATION: Recent fall with splenic laceration and hemorrhage. Continued blood loss. Embolization requested by trauma surgery service. EXAM: SPLENIC ARTERIOGRAM AND EMBOLIZATION ULTRASOUND GUIDANCE FOR VASCULAR ACCESS MEDICATIONS: no antibiotics indicated ANESTHESIA/SEDATION: Intravenous Fentanyl 6mcg and Versed 1.5mg  were administered as conscious sedation during continuous monitoring of the patient's level of consciousness and physiological / cardiorespiratory status by the radiology RN, with a total moderate sedation time of 45 minutes. PROCEDURE: Informed consent was obtained from the patient following explanation of the procedure, risks, benefits and alternatives. The patient understands, agrees and consents for the procedure. All questions were addressed. A time out was performed prior to the initiation of the procedure. Maximal barrier sterile technique utilized including caps, mask, sterile gowns, sterile gloves, large sterile drape, hand hygiene, and Betadine prep. Patency of the right common femoral artery confirmed with ultrasound. Under real-time ultrasound guidance, the vessel was accessed with a 21-gauge micropuncture needle, exchanged over a 018 guidewire for a transitional dilator, through which a 035 guidewire was advanced. Over this, a 5 French sheath placed. 5 French C2 catheter coaxially advanced and used to selectively catheterize the celiac axis for arteriography. Patent splenic artery identified. C2  catheter was advanced to the midportion of the splenic artery for selective arteriography. At least 3 sites of pseudoaneurysm/extravasation were evident from superior pole of the spleen. A coaxial Renegade microcatheter was advanced for selective third-order catheterization of the superior branch of the splenic artery for arteriography. Subsequent embolization with 4 and 5 mm coils was performed. The microcatheter was used for selective arteriography of the inferior branch splenic artery. No aneurysm, pseudoaneurysm, extravasation, or other arterial abnormality was localized in the lower pole of the spleen. Microcatheter was removed and final splenic arteriogram through the 5 French catheter was performed. Catheter was removed. After confirmatory femoral arteriogram, sheath removed and hemostasis achieved with aid of ExoSeal device. The patient tolerated the procedure well. CONTRAST:  80 mL Omnipaque 300 IV FLUOROSCOPY TIME:  10 minutes 6 seconds; 299 mGy COMPLICATIONS: None immediate. IMPRESSION: 1. Technically successful coil embolization of superior branch splenic artery, with no further opacification of sites of pseudoaneurysm/hemorrhage. 2. Arteriography of the inferior pole of the spleen was normal, and perfusion via inferior branch of the splenic artery was preserved. I telephoned the results to Dr. Georganna Skeans at the conclusion of the procedure. Electronically Signed   By: Lucrezia Europe M.D.   On: 12/16/2020 12:34    Labs:  CBC: Recent Labs    12/15/20 0448 12/15/20 1224 12/16/20 0433 12/16/20 1147 12/17/20 0512  WBC 8.8 8.1 6.3  --  8.0  HGB 6.9* 7.4* 6.9* 7.8* 8.9*  HCT 21.5* 22.7* 21.2* 23.7* 26.6*  PLT 123* 114* 125*  --  149*    COAGS: Recent Labs    12/13/20 0630  INR 1.3*  APTT 28    BMP: Recent Labs    12/14/20 0635 12/15/20 0448 12/16/20 0433 12/17/20 0512  NA 137 138 137 137  K 5.5* 4.6 3.7 3.8  CL 106 102 100 99  CO2 22 28 31 31   GLUCOSE 143* 135* 104* 118*   BUN 39* 25* 15  11  CALCIUM 8.6* 8.4* 8.3* 8.3*  CREATININE 2.21* 1.26* 1.03 0.91  GFRNONAA 34* >60 >60 >60    LIVER FUNCTION TESTS: Recent Labs    02/19/20 0806 12/13/20 0221 12/14/20 0141  BILITOT 0.4 0.7 0.8  AST 36 46* 37  ALT 36 35 29  ALKPHOS 56 59 48  PROT 7.4 6.5 5.8*  ALBUMIN 4.3 3.7 3.4*    Assessment and Plan:   12/16/20--Splenic arteriogram and embolization flow to lower pole preserved Up in bed and doing well Hg 8.9 Will follow  Electronically Signed: Lavonia Drafts, PA-C 12/17/2020, 10:33 AM   I spent a total of 15 Minutes at the the patient's bedside AND on the patient's hospital floor or unit, greater than 50% of which was counseling/coordinating care for splenic artery embolization

## 2020-12-17 NOTE — Progress Notes (Signed)
Physical Therapy Evaluation Patient Details Name: Clarence Alvarez MRN: [id number] DOB: 07-22-1965 Today's Date: 12/17/2020   History of Present Illness  Clarence Alvarez is a 55 y.o. male who presented to [facility] ED on 6/3 s/p mechanical fall at work, found to have a left clavicle fracture. He was discharged home, the following evening he experienced a syncopal episode. Pt returned to the ED 6/4 where he was found to have L 3rd - 10th rib fractures, anterior displaced fracture of sternal end of left clavicle and splenic laceration with hemoperitoneum. 6/7 splenic arteriogram and embolization.  Per Dr. Griffin Basil, holding off on clavicle surgery P stability. Pt with a past medical history significant for OSA, asthma, HTN and DM .    Clinical Impression  Pt presents with condition above and deficits mentioned below, see PT Problem List. PTA, he was independent living with his wife in a 1-level house with 2 STE with a handrail. 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 Currently, pt displays deficits in activity tolerance, balance, coordination, and L UE ROM and strength. He is currently ambulating up to ~175 ft without UE support with min guard assist before he fatigues. Educated pt on to try to remain mobile and get OOB to chair for each meal and take walks with nursing staff as available throughout day. Pt could benefit from follow-up Outpatient PT services to address his deficits and maximize his safety and independence with all functional mobility and assist him in returning to work. Will continue to follow acutely.    Follow Up Recommendations Outpatient PT;Supervision for mobility/OOB    Equipment Recommendations  None recommended by PT    Recommendations for Other Services       Precautions / Restrictions Precautions Precautions: Fall Precaution Comments: pt pending L clavicle sx Required Braces or Orthoses: Sling Restrictions Weight Bearing  Restrictions: Yes LUE Weight Bearing: Non weight bearing      Mobility  Bed Mobility               General bed mobility comments: Pt sitting up in recliner upon arrival.    Transfers Overall transfer level: Needs assistance Equipment used: None Transfers: Sit to/from Stand Sit to Stand: Min guard         General transfer comment: Sit <> stand from recliner 2x with extra time and min guard for safety. No overt LOB.  Ambulation/Gait Ambulation/Gait assistance: Min guard Gait Distance (Feet): 175 Feet Assistive device: None Gait Pattern/deviations: Step-through pattern;Decreased stride length;Narrow base of support Gait velocity: reduced Gait velocity interpretation: <1.8 ft/sec, indicate of risk for recurrent falls General Gait Details: Pt with short steps and mild unsteadiness but no overt LOB. Pt ambulates at slow pace and displays increasing narrowing of BOS and foot drag as distance and thus fatigue increase. Min guard for safety.  Stairs            Wheelchair Mobility    Modified Rankin (Stroke Patients Only) Modified Rankin (Stroke Patients Only) Pre-Morbid Rankin Score: No symptoms Modified Rankin: Moderately severe disability     Balance Overall balance assessment: Mild deficits observed, not formally tested                                           Pertinent Vitals/Pain Pain Assessment: 0-10 Pain Score: 6  Pain Location: L collar bone, L  ribs, abdomen Pain Descriptors / Indicators: Pressure;Sharp;Discomfort;Grimacing;Guarding Pain Intervention(s): Limited activity within patient's tolerance;Monitored during session;Premedicated before session;Repositioned    Home Living Family/patient expects to be discharged to:: Private residence Living Arrangements: Spouse/significant other Available Help at Discharge: Family;Available 24 hours/day Type of Home: House Home Access: Stairs to enter Entrance Stairs-Rails: Left  (ascending) Entrance Stairs-Number of Steps: 2 Home Layout: One level Home Equipment: Bedside commode;Walker - standard;Cane - quad;Shower seat      Prior Function Level of Independence: Independent         Comments: Pt works as a Geneticist, 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     Hand Dominance   Dominant Hand: Right    Extremity/Trunk Assessment   Upper Extremity Assessment Upper Extremity Assessment: LUE deficits/detail LUE Deficits / Details: L clavicle fx and L UE in sling with NWB orders, limiting strength and mobility; hx of L finger tingling/numbness from prior cervical surgery LUE Sensation: WNL (no numbness/tingling this date) LUE Coordination: WNL    Lower Extremity Assessment Lower Extremity Assessment: Overall WFL for tasks assessed    Cervical / Trunk Assessment Cervical / Trunk Assessment: Normal  Communication   Communication: No difficulties  Cognition Arousal/Alertness: Awake/alert Behavior During Therapy: WFL for tasks assessed/performed Overall Cognitive Status: Within Functional Limits for tasks assessed                                 General Comments: A&Ox4, appears to be aware of his deficits and safety.      General Comments General comments (skin integrity, edema, etc.): Pt denying any lightheadedness or dizziness throughout    Exercises     Assessment/Plan    PT Assessment Patient needs continued PT services  PT Problem List Decreased strength;Decreased range of motion;Decreased activity tolerance;Decreased balance;Decreased mobility;Decreased coordination;Pain       PT Treatment Interventions DME instruction;Gait training;Stair training;Functional mobility training;Therapeutic exercise;Therapeutic activities;Balance training;Neuromuscular re-education;Patient/family education    PT Goals (Current goals can be found in the Care Plan section)  Acute Rehab PT Goals Patient  Stated Goal: to improve PT Goal Formulation: With patient Time For Goal Achievement: 12/31/20 Potential to Achieve Goals: Good    Frequency Min 3X/week   Barriers to discharge        Co-evaluation               AM-PAC PT "6 Clicks" Mobility  Outcome Measure Help needed turning from your back to your side while in a flat bed without using bedrails?: A Little Help needed moving from lying on your back to sitting on the side of a flat bed without using bedrails?: A Little Help needed moving to and from a bed to a chair (including a wheelchair)?: A Little Help needed standing up from a chair using your arms (e.g., wheelchair or bedside chair)?: A Little Help needed to walk in hospital room?: A Little Help needed climbing 3-5 steps with a railing? : A Little 6 Click Score: 18    End of Session Equipment Utilized During Treatment: Gait belt;Other (comment) (sling) Activity Tolerance: Patient tolerated treatment well Patient left: in chair;with call bell/phone within reach Nurse Communication: Mobility status PT Visit Diagnosis: Unsteadiness on feet (R26.81);Other abnormalities of gait and mobility (R26.89);History of falling (Z91.81);Difficulty in walking, not elsewhere classified (R26.2);Pain Pain - Right/Left: Left Pain - part of body:  (clavicle)    Time: 8127-5170 PT Time Calculation (min) (ACUTE  ONLY): 32 min   Charges:   PT Evaluation $PT Eval Moderate Complexity: 1 Mod PT Treatments $Therapeutic Activity: 8-22 mins        Moishe Spice, PT, DPT Acute Rehabilitation Services  Pager: [phone] Office: [phone]   Orvan Falconer 12/17/2020, 3:26 PM

## 2020-12-17 NOTE — Progress Notes (Signed)
Occupational Therapy Evaluation Patient Details Name: Clarence Alvarez MRN: [id number] DOB: February 24, 1966 Today's Date: 12/17/2020    History of Present Illness Clarence Alvarez is a 55 y.o. male who presented to [facility] ED on 6/3 s/p mechanical fall at work, found to have a left clavicle fracture. He was discharged home, the following evening he experienced a syncopal episode. Pt returned to the ED 6/4 where he was found to have L 3rd - 10th rib fractures, anterior displaced fracture of sternal end of left clavicle and splenic laceration with hemoperitoneum. 6/7 splenic arteriogram and embolization.  Per Dr. Griffin Basil, holding off on clavicle surgery P stability. Pt with a past medical history significant for OSA, asthma, HTN and DM .   Clinical Impression   Clarence Alvarez was evaluated s/p the above fall and related impairments. PTA pt was indep in all ADL/IADLs including driving and working. Upon evaluation he is transferring and completing functional ambulation with min guard, given increased time for pain management. Pt educated on compensatory techniques for bathing and dressing for LUE, reviewed precautions and sling wear/care, pt verbalized understanding. Pt also educated on pillow splint technique for bed mobility, and to transferring OOB and in bed towards the right for pain management. Pt verbalized understanding of ROM exercises for L elbow, wrist and hand. Pt would benefit from continued OT services acutely to progress function and indep in all ADLs. Recommend pt d/c home and follow the surgeons follow up plan s/p clavicle surgery (pending for Friday 6/10).    Follow Up Recommendations  Follow surgeon's recommendation for DC plan and follow-up therapies;Supervision - Intermittent    Equipment Recommendations  Other (comment) (determine needs post sx intervention - likely 0 DME needs)       Precautions / Restrictions Precautions Precautions: Fall Precaution Comments: pt pending L clavicle sx Required  Braces or Orthoses: Sling Restrictions Weight Bearing Restrictions: Yes LUE Weight Bearing: Touch down weight bearing      Mobility Bed Mobility               General bed mobility comments: Pt sitting up in recliner upon arrival.    Transfers Overall transfer level: Needs assistance Equipment used: None Transfers: Sit to/from Stand Sit to Stand: Min guard         General transfer comment: Sit <> stand from recliner 2x with extra time and min guard for safety. No overt LOB.    Balance Overall balance assessment: Needs assistance Sitting-balance support: Feet supported Sitting balance-Leahy Scale: Good     Standing balance support: No upper extremity supported Standing balance-Leahy Scale: Fair              ADL either performed or assessed with clinical judgement   ADL Overall ADL's : Needs assistance/impaired Eating/Feeding: Set up;Sitting   Grooming: Wash/dry hands;Wash/dry face;Oral care;Applying deodorant;Brushing hair;Set up;Sitting   Upper Body Bathing: Minimal assistance;Cueing for compensatory techniques;Cueing for safety;Sitting   Lower Body Bathing: Minimal assistance;Cueing for safety;Cueing for compensatory techniques;Sit to/from stand   Upper Body Dressing : Maximal assistance;Sitting;Cueing for compensatory techniques   Lower Body Dressing: Moderate assistance;Sit to/from stand   Toilet Transfer: Min guard;Ambulation;Comfort height toilet   Toileting- Clothing Manipulation and Hygiene: Supervision/safety;Sit to/from stand       Functional mobility during ADLs: Min guard General ADL Comments: compensatory techqniues for pain management of LUE     Vision Baseline Vision/History: No visual deficits Vision Assessment?: No apparent visual deficits            Pertinent  Vitals/Pain Pain Assessment: Faces Pain Score: 6  Faces Pain Scale: Hurts even more Pain Location: L collar bone, L ribs, abdomen Pain Descriptors / Indicators:  Pressure;Sharp;Discomfort;Grimacing;Guarding Pain Intervention(s): Limited activity within patient's tolerance;Monitored during session     Hand Dominance Right   Extremity/Trunk Assessment Upper Extremity Assessment Upper Extremity Assessment: LUE deficits/detail LUE Deficits / Details: L clavicle fx and L UE in sling with NWB orders, limiting strength and mobility; hx of L finger tingling/numbness from prior cervical surgery LUE Sensation: WNL LUE Coordination: WNL   Lower Extremity Assessment Lower Extremity Assessment: Defer to PT evaluation   Cervical / Trunk Assessment Cervical / Trunk Assessment: Normal;Other exceptions Cervical / Trunk Exceptions: 3-10 L rib fractures   Communication Communication Communication: No difficulties   Cognition Arousal/Alertness: Awake/alert Behavior During Therapy: WFL for tasks assessed/performed Overall Cognitive Status: Within Functional Limits for tasks assessed         General Comments: A&Ox4, appears to be aware of his deficits and safety.   General Comments  reviewed incentive spirometer, pilow splinting for ribs, compensatory techniques, LUE NWB status and importance of OOB mobility    Exercises Exercises: General Upper Extremity General Exercises - Upper Extremity Elbow Flexion: AROM;5 reps;Seated;Left Elbow Extension: AROM;10 reps;Seated;Left Wrist Flexion: AROM;5 reps;Left Wrist Extension: AROM;Left;5 reps;Seated Digit Composite Flexion: AROM;Left;5 reps;Seated Composite Extension: AROM;Left;5 reps;Seated   Shoulder Instructions      Home Living Family/patient expects to be discharged to:: Private residence Living Arrangements: Spouse/significant other Available Help at Discharge: Family;Available 24 hours/day Type of Home: House Home Access: Stairs to enter [employer] of Steps: 2 Entrance Stairs-Rails: Left Home Layout: One level     Bathroom Shower/Tub: Teacher, early years/pre:  Handicapped height     Home Equipment: Bedside commode;Walker - standard;Cane - quad;Shower seat          Prior Functioning/Environment Level of Independence: Independent        Comments: Pt works as a Geneticist, 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        OT Problem List: Decreased strength;Decreased range of motion;Decreased activity tolerance;Impaired balance (sitting and/or standing);Decreased safety awareness;Decreased knowledge of use of DME or AE;Decreased knowledge of precautions;Pain;Impaired UE functional use      OT Treatment/Interventions: Self-care/ADL training;Therapeutic exercise;DME and/or AE instruction;Modalities;Therapeutic activities;Patient/family education    OT Goals(Current goals can be found in the care plan section) Acute Rehab OT Goals Patient Stated Goal: to improve OT Goal Formulation: With patient Time For Goal Achievement: 12/31/20 Potential to Achieve Goals: Good ADL Goals Pt Will Perform Upper Body Bathing: with supervision;sitting Pt Will Perform Upper Body Dressing: with supervision;sitting Pt Will Perform Lower Body Dressing: sit to/from stand;with supervision Pt Will Transfer to Toilet: ambulating;regular height toilet;Independently Pt/caregiver will Perform Home Exercise Program: Left upper extremity;Independently;With written HEP provided Additional ADL Goal #1: Pt will indep recall at least 3 fall prevention strategies to prepare for safe transition into the home environment  OT Frequency: Min 2X/week    AM-PAC OT "6 Clicks" Daily Activity     Outcome Measure Help from another person eating meals?: A Little Help from another person taking care of personal grooming?: A Little Help from another person toileting, which includes using toliet, bedpan, or urinal?: A Little Help from another person bathing (including washing, rinsing, drying)?: A Little Help from another person to put on and taking off  regular upper body clothing?: A Lot Help from another person to put on and taking off  regular lower body clothing?: A Lot 6 Click Score: 16   End of Session Equipment Utilized During Treatment: Other (comment) (sling) Nurse Communication: Mobility status  Activity Tolerance: Patient tolerated treatment well Patient left: in chair;with call bell/phone within reach;with family/visitor present  OT Visit Diagnosis: History of falling (Z91.81);Pain Pain - Right/Left: Left Pain - part of body: Shoulder;Arm (ribs)                Time: 9012-2241 OT Time Calculation (min): 10 min Charges:  OT General Charges $OT Visit: 1 Visit OT Evaluation $OT Eval Low Complexity: 1 Low    Aixa Corsello A Nikiah Goin 12/17/2020, 4:36 PM

## 2020-12-17 NOTE — Progress Notes (Signed)
Patient ID: ISOM KOCHAN, male   DOB: April 19, 1966, 55 y.o.   MRN: [id number]     Subjective: Feels better Wants to mobilize Tol CL ROS negative except as listed above. Objective: Vital signs in last 24 hours: Temp:  [98.1 F (36.7 C)-99 F (37.2 C)] 99 F (37.2 C) (06/08 0736) Pulse Rate:  [89-110] 98 (06/08 0736) Resp:  [18-28] 20 (06/08 0736) BP: (135-195)/(60-84) 148/78 (06/08 0736) SpO2:  [91 %-100 %] 94 % (06/08 0744) Last BM Date: 12/16/20  Intake/Output from previous day: 06/07 0701 - 06/08 0700 In: 400 [P.O.:400] Out: 3875 [Urine:3875] Intake/Output this shift: No intake/output data recorded.  General appearance: cooperative Resp: clear to auscultation bilaterally Cardio: regular rate and rhythm GI: soft, mild dist Extremities: calves soft, sling LUE Neurologic: Mental status: Alert, oriented, thought content appropriate  Lab Results: CBC  Recent Labs    12/16/20 0433 12/16/20 1147 12/17/20 0512  WBC 6.3  --  8.0  HGB 6.9* 7.8* 8.9*  HCT 21.2* 23.7* 26.6*  PLT 125*  --  149*   BMET Recent Labs    12/16/20 0433 12/17/20 0512  NA 137 137  K 3.7 3.8  CL 100 99  CO2 31 31  GLUCOSE 104* 118*  BUN 15 11  CREATININE 1.03 0.91  CALCIUM 8.3* 8.3*   PT/INR  Anti-infectives: Anti-infectives (From admission, onward)   None      Assessment/Plan: FFH  Spleen laceration -  Had ongoing ooze so he underwent angioembolization 6/7 by Dr. Jarvis Newcomer. He still has a good amount of perfused spleen so he does not need vaccines. L clavicle fx - per Dr. Griffin Basil, holding off on surgery P stability AKI - resolved ABL anemia - improved, Hb 8.9 FEN - reg diet, D/C IVF DVT - SCDs, hold LMWH until hgb stability Dispo - SDU, mobilize with therapies today  LOS: 4 days    Georganna Skeans, MD, MPH, FACS Trauma & General Surgery Use [email or web address] to contact on call provider  12/17/2020

## 2020-12-17 NOTE — H&P (View-Only) (Signed)
Orthopaedic Trauma Service (OTS) Consult   Patient ID: Clarence Alvarez MRN: [id number] DOB/AGE: April 07, 1966 55 y.o.  Reason for Consult:Left clavicle fracture Referring Physician: Dr. Ophelia Charter, MD Raliegh Ip  HPI: Clarence Alvarez is an 55 y.o. male who is being seen in consultation at the request of Dr. Griffin Basil for evaluation of left clavicle fracture.  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  He saw Dr. Griffin Basil initially and was to undergo outpatient surgery for his clavicle but unfortunately he started to develop significant symptoms consistent with a splenic laceration.  As result he was admitted and was found to be anemic.  He actually underwent IR embolization.  He has been inpatient for the duration of his stay.  Due to with OR availability Dr. Griffin Basil asked for my assistance with fixing his clavicle while the patient was in the hospital.  Patient was seen and evaluated on 4 N. progressive.  He is currently comfortable.  His wife and daughter are at bedside.  He is right-hand dominant.  Denies any other injuries to his extremities.  Denies any numbness or tingling.  He is otherwise very active.  Denies any tobacco use.  Past Medical History:  Diagnosis Date   Allergic rhinitis, seasonal    Anterior displaced fracture of sternal end of left clavicle, initial encounter for closed fracture 12/12/2020   Arthritis    right knee and both shoulders and bursisits   Concussion age 15   no residual   GERD (gastroesophageal reflux disease)    History of adenomatous polyp of colon    03-25-2014  tubular adenoma's /  hyperplastic   History of hiatal hernia    History of kidney stones Oct 05, 2016   passed on own   Hypertension    Left ureteral stone    10-05-2016   Mild obstructive sleep apnea    per study 10-05-12, pt states he does not have sleep apnea   Moderate persistent asthma    followed by pcp--  uncomplicated   MRSA infection 2008-10-05   buttocks   Pneumonia x 3 last time 10/05/09   Pre-diabetes    Sleep apnea    no CPAP used   Traumatic complete tear of right rotator cuff 11/30/2018   Wears dentures    full   Wears glasses     Past Surgical History:  Procedure Laterality Date   37 HOUR [place] STUDY N/A 05/04/2018   Procedure: [place] STUDY;  Surgeon: Jonathon Bellows, MD;  Location: [facility] ENDOSCOPY;  Service: Gastroenterology;  Laterality: N/A;   anterior cervucal  2015/10/06   with fusion   COLONOSCOPY  03/25/2014 , October 05, 2017   ESOPHAGEAL MANOMETRY N/A 05/04/2018   Procedure: ESOPHAGEAL MANOMETRY (EM);  Surgeon: Jonathon Bellows, MD;  Location: [facility] ENDOSCOPY;  Service: Gastroenterology;  Laterality: N/A;   ESOPHAGOGASTRODUODENOSCOPY (EGD) WITH PROPOFOL N/A 02/24/2018   Procedure: ESOPHAGOGASTRODUODENOSCOPY (EGD) WITH PROPOFOL;  Surgeon: Jonathon Bellows, MD;  Location: [facility] ENDOSCOPY;  Service: Gastroenterology;  Laterality: N/A;   IR ANGIOGRAM FOLLOW UP STUDY  12/16/2020   IR ANGIOGRAM SELECTIVE EACH ADDITIONAL VESSEL  12/16/2020   IR ANGIOGRAM VISCERAL SELECTIVE  12/16/2020   IR EMBO ART  VEN HEMORR LYMPH EXTRAV  INC GUIDE ROADMAPPING  12/16/2020   IR US GUIDE VASC ACCESS RIGHT  12/16/2020   KNEE SURGERY Right October 05, 2013   RESECTION DISTAL CLAVICAL Right 12/06/2018   Procedure:  RESECTION DISTAL CLAVICAL AND DEBRIDEMENT OF THE SHOULDER;  Surgeon: Elsie Saas, MD;  Location: [place];  Service: Orthopedics;  Laterality: Right;  SCALENE BLOCK   right thumb surgery  2019   trigger finger release   SHOULDER ARTHROSCOPY WITH ROTATOR CUFF REPAIR AND SUBACROMIAL DECOMPRESSION Left 07-24-2010  dr Noemi Chapel @  [facility]   and Labrum tear debridement   SHOULDER ARTHROSCOPY WITH ROTATOR CUFF REPAIR AND SUBACROMIAL DECOMPRESSION Right 12/06/2018   Procedure: SHOULDER ARTHROSCOPY WITH ROTATOR CUFF REPAIR AND SUBACROMIAL DECOMPRESSION;  Surgeon: Elsie Saas, MD;  Location: [place];  Service: Orthopedics;   Laterality: Right;  SCALENE BLOCK   UPPER GASTROINTESTINAL ENDOSCOPY  2019    Family History  Problem Relation Age of Onset   Diabetes Maternal Grandmother    Colon cancer Maternal Grandmother        dx age 26 - recurance at age 70   Diabetes Mother    Hepatitis C Mother    Prostate cancer Father    Colon cancer Father        dx - 98's   Diabetes Paternal Grandmother    Mental illness Brother    Mental illness Sister    Mental illness Daughter    Stomach cancer Maternal Grandfather    COPD Other        Aunt   Diabetes Other        siblings   Pancreatic cancer Paternal Aunt    Esophageal cancer Neg Hx    Rectal cancer Neg Hx     Social History:  reports that he quit smoking about 36 years ago. His smoking use included cigarettes. He quit after 3.00 years of use. He quit smokeless tobacco use about 5 years ago.  His smokeless tobacco use included snuff. He reports previous alcohol use of about 3.0 standard drinks of alcohol per week. He reports that he does not use drugs.  Allergies:  Allergies  Allergen Reactions   Lactose Intolerance (Gi) Other (See Comments)    Gas and bloating   Latex Other (See Comments)    Skin irritation dsicoloration/blisters    Medications:  No current facility-administered medications on file prior to encounter.   Current Outpatient Medications on File Prior to Encounter  Medication Sig Dispense Refill   albuterol (PROVENTIL) (2.5 MG/3ML) 0.083% nebulizer solution Take 3 mLs (2.5 mg total) by nebulization every 6 (six) hours as needed for wheezing or shortness of breath. 150 mL 1   albuterol (VENTOLIN HFA) 108 (90 Base) MCG/ACT inhaler INHALE 2 PUFFS BY MOUTH 4 TIMES A DAY (Patient taking differently: Inhale 2 puffs into the lungs every 6 (six) hours as needed for wheezing or shortness of breath.) 8.5 g 5   amLODipine (NORVASC) 5 MG tablet TAKE 1 TABLET BY MOUTH EVERY DAY (Patient taking differently: Take 5 mg by mouth daily.) 30 tablet 5    cetirizine (ZYRTEC) 10 MG tablet Take 10 mg by mouth every morning.      cyclobenzaprine (FLEXERIL) 10 MG tablet TAKE 0.5-1 TABLETS (5-10 MG TOTAL) BY MOUTH AT BEDTIME AS NEEDED FOR MUSCLE SPASMS. (Patient taking differently: Take 10 mg by mouth at bedtime as needed for muscle spasms.) 30 tablet 1   FLOVENT HFA 110 MCG/ACT inhaler INHALE 1 PUFF BY MOUTH TWICE A DAY (Patient taking differently: Inhale 1 puff into the lungs in the morning and at bedtime.) 12 g 5   fluticasone (FLONASE) 50 MCG/ACT nasal spray SPRAY 2 SPRAYS INTO EACH NOSTRIL EVERY DAY (Patient  taking differently: Place 2 sprays into both nostrils daily.) 16 mL 11   indomethacin (INDOCIN) 50 MG capsule Take 1 capsule (50 mg total) by mouth 3 (three) times daily with meals. (Patient taking differently: Take 50 mg by mouth 2 (two) times daily with a meal.) 60 capsule 1   ketorolac (TORADOL) 60 MG/2ML SOLN injection Inject 60 mg into the muscle once.     methocarbamol (ROBAXIN) 750 MG tablet Take 750 mg by mouth in the morning and at bedtime.     omeprazole (PRILOSEC) 20 MG capsule TAKE 1 CAPSULE BY MOUTH EVERY DAY (Patient taking differently: Take 20 mg by mouth daily.) 30 capsule 11   OVER THE COUNTER MEDICATION Take 1 tablet by mouth daily.     oxyCODONE (ROXICODONE) 5 MG immediate release tablet Take 1 tablet (5 mg total) by mouth every 6 (six) hours as needed for moderate pain or severe pain. 20 tablet 0   sildenafil (VIAGRA) 100 MG tablet Take 0.5-1 tablets (50-100 mg total) by mouth daily as needed for erectile dysfunction. (Patient not taking: Reported on 12/13/2020) 5 tablet 11   [DISCONTINUED] albuterol (PROVENTIL,VENTOLIN) 90 MCG/ACT inhaler Inhale 2 puffs into the lungs every 6 (six) hours as needed for wheezing. 17 g 2     ROS: Constitutional: No fever or chills Vision: No changes in vision ENT: No difficulty swallowing CV: No chest pain Pulm: No SOB or wheezing GI: No nausea or vomiting GU: No urgency or inability to hold  urine Skin: No poor wound healing Neurologic: No numbness or tingling Psychiatric: No depression or anxiety Heme: No bruising Allergic: No reaction to medications or food   Exam: Blood pressure (!) 142/75, pulse 100, temperature 98.9 F (37.2 C), temperature source Oral, resp. rate (!) 22, height 5' 11.5" (1.816 m), weight 115.9 kg, SpO2 94 %. General: No acute distress Orientation: Awake alert and oriented x3 Mood and Affect: Cooperative and pleasant Gait: Did not assess due to patient being in bed rest Coordination and balance: Within normal limits  Left upper extremity: Arm is in sling.  Skin is clean dry and intact.  Crepitus and pain with palpation about his clavicle.  Unable to tolerate any significant range of motion of the shoulder.  No obvious deformity about the elbow or wrist.  He has intact motor and sensory function to median, radial and ulnar nerve distribution.  He has brisk cap refill less than 2 seconds with 2+ radial pulses.  Right upper extremity: Skin without lesions. No tenderness to palpation. Full painless ROM, full strength in each muscle groups without evidence of instability.   Medical Decision Making: Data: Imaging: X-rays of the left clavicle are reviewed which shows a comminuted midshaft clavicle fracture with notable shortening and displacement.  Labs:  Results for orders placed or performed during the hospital encounter of 12/13/20 (from the past 24 hour(s))  CBC     Status: Abnormal   Collection Time: 12/17/20  5:12 AM  Result Value Ref Range   WBC 8.0 4.0 - 10.5 K/uL   RBC 2.88 (L) 4.22 - 5.81 MIL/uL   Hemoglobin 8.9 (L) 13.0 - 17.0 g/dL   HCT 26.6 (L) 39.0 - 52.0 %   MCV 92.4 80.0 - 100.0 fL   MCH 30.9 26.0 - 34.0 pg   MCHC 33.5 30.0 - 36.0 g/dL   RDW 13.0 11.5 - 15.5 %   Platelets 149 (L) 150 - 400 K/uL   nRBC 0.5 (H) 0.0 - 0.2 %  Basic metabolic  panel     Status: Abnormal   Collection Time: 12/17/20  5:12 AM  Result Value Ref Range    Sodium 137 135 - 145 mmol/L   Potassium 3.8 3.5 - 5.1 mmol/L   Chloride 99 98 - 111 mmol/L   CO2 31 22 - 32 mmol/L   Glucose, Bld 118 (H) 70 - 99 mg/dL   BUN 11 6 - 20 mg/dL   Creatinine, Ser 0.91 0.61 - 1.24 mg/dL   Calcium 8.3 (L) 8.9 - 10.3 mg/dL   GFR, Estimated >60 >60 mL/min   Anion gap 7 5 - 15     Imaging or Labs ordered: None  Medical history and chart was reviewed and case discussed with medical provider.  Assessment/Plan: 55 year old right-hand-dominant male with a left clavicle fracture.  Dr. Griffin Basil have reviewed risks and benefits with the patient.  I reviewed them as well.  We discussed the high risk of nonunion with nonoperative management.  We also discussed the decreased risk of nonunion with ORIF.  Other risks I discussed included nerve and blood vessel injury including anterior chest numbness.  We also discussed the possibility of nerve or blood vessel injury, shoulder stiffness, shoulder dysfunction, DVT, even the possibility anesthetic complications.  The patient agreed to proceed with surgery and consent was obtained.  We will tentatively plan for surgery on Friday pending his stabilization from his splenic laceration.  Please make n.p.o. after midnight on Thursday evening  Shona Needles, MD [facility] [phone] (office) [email or web address]

## 2020-12-17 NOTE — Progress Notes (Signed)
   12/17/20 1030  Clinical Encounter Type  Visited With Patient and family together  Visit Type Initial  Referral From Nurse  Consult/Referral To Chaplain   Chaplain responded to consult request for Advance Directive (AD). Pt's wife and daughter were at bedside. Chaplain provided AD education and engaged active listening. Chaplain informed Pt how to reach chaplain if he has any questions and if he wants the AD notarized while admitted. Chaplain remains available.  This note was prepared by Chaplain Resident, Dante Gang, MDiv. Chaplain remains available as needed through the on-call pager: [phone].

## 2020-12-17 NOTE — Consult Note (Signed)
Orthopaedic Trauma Service (OTS) Consult   Patient ID: Clarence Alvarez MRN: [id number] DOB/AGE: 11/10/1965 55 y.o.  Reason for Consult:Left clavicle fracture Referring Physician: Dr. Ophelia Charter, MD Raliegh Ip  HPI: Clarence Alvarez is an 55 y.o. male who is being seen in consultation at the request of Dr. Griffin Basil for evaluation of left clavicle fracture.  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  He saw Dr. Griffin Basil initially and was to undergo outpatient surgery for his clavicle but unfortunately he started to develop significant symptoms consistent with a splenic laceration.  As result he was admitted and was found to be anemic.  He actually underwent IR embolization.  He has been inpatient for the duration of his stay.  Due to with OR availability Dr. Griffin Basil asked for my assistance with fixing his clavicle while the patient was in the hospital.  Patient was seen and evaluated on 4 N. progressive.  He is currently comfortable.  His wife and daughter are at bedside.  He is right-hand dominant.  Denies any other injuries to his extremities.  Denies any numbness or tingling.  He is otherwise very active.  Denies any tobacco use.  Past Medical History:  Diagnosis Date   Allergic rhinitis, seasonal    Anterior displaced fracture of sternal end of left clavicle, initial encounter for closed fracture 12/12/2020   Arthritis    right knee and both shoulders and bursisits   Concussion age 62   no residual   GERD (gastroesophageal reflux disease)    History of adenomatous polyp of colon    03-25-2014  tubular adenoma's /  hyperplastic   History of hiatal hernia    History of kidney stones 28-Sep-2016   passed on own   Hypertension    Left ureteral stone    2016/09/28   Mild obstructive sleep apnea    per study Sep 28, 2012, pt states he does not have sleep apnea   Moderate persistent asthma    followed by pcp--  uncomplicated   MRSA infection 2008-09-28   buttocks   Pneumonia x 3 last time 28-Sep-2009   Pre-diabetes    Sleep apnea    no CPAP used   Traumatic complete tear of right rotator cuff 11/30/2018   Wears dentures    full   Wears glasses     Past Surgical History:  Procedure Laterality Date   26 HOUR [place] STUDY N/A 05/04/2018   Procedure: [place] STUDY;  Surgeon: Jonathon Bellows, MD;  Location: [facility] ENDOSCOPY;  Service: Gastroenterology;  Laterality: N/A;   anterior cervucal  2015/09/29   with fusion   COLONOSCOPY  03/25/2014 , 09-28-2017   ESOPHAGEAL MANOMETRY N/A 05/04/2018   Procedure: ESOPHAGEAL MANOMETRY (EM);  Surgeon: Jonathon Bellows, MD;  Location: [facility] ENDOSCOPY;  Service: Gastroenterology;  Laterality: N/A;   ESOPHAGOGASTRODUODENOSCOPY (EGD) WITH PROPOFOL N/A 02/24/2018   Procedure: ESOPHAGOGASTRODUODENOSCOPY (EGD) WITH PROPOFOL;  Surgeon: Jonathon Bellows, MD;  Location: [facility] ENDOSCOPY;  Service: Gastroenterology;  Laterality: N/A;   IR ANGIOGRAM FOLLOW UP STUDY  12/16/2020   IR ANGIOGRAM SELECTIVE EACH ADDITIONAL VESSEL  12/16/2020   IR ANGIOGRAM VISCERAL SELECTIVE  12/16/2020   IR EMBO ART  VEN HEMORR LYMPH EXTRAV  INC GUIDE ROADMAPPING  12/16/2020   IR US GUIDE VASC ACCESS RIGHT  12/16/2020   KNEE SURGERY Right September 28, 2013   RESECTION DISTAL CLAVICAL Right 12/06/2018   Procedure:  RESECTION DISTAL CLAVICAL AND DEBRIDEMENT OF THE SHOULDER;  Surgeon: Elsie Saas, MD;  Location: [place];  Service: Orthopedics;  Laterality: Right;  SCALENE BLOCK   right thumb surgery  2019   trigger finger release   SHOULDER ARTHROSCOPY WITH ROTATOR CUFF REPAIR AND SUBACROMIAL DECOMPRESSION Left 07-24-2010  dr Noemi Chapel @  [facility]   and Labrum tear debridement   SHOULDER ARTHROSCOPY WITH ROTATOR CUFF REPAIR AND SUBACROMIAL DECOMPRESSION Right 12/06/2018   Procedure: SHOULDER ARTHROSCOPY WITH ROTATOR CUFF REPAIR AND SUBACROMIAL DECOMPRESSION;  Surgeon: Elsie Saas, MD;  Location: [place];  Service: Orthopedics;   Laterality: Right;  SCALENE BLOCK   UPPER GASTROINTESTINAL ENDOSCOPY  2019    Family History  Problem Relation Age of Onset   Diabetes Maternal Grandmother    Colon cancer Maternal Grandmother        dx age 10 - recurance at age 1   Diabetes Mother    Hepatitis C Mother    Prostate cancer Father    Colon cancer Father        dx - 59's   Diabetes Paternal Grandmother    Mental illness Brother    Mental illness Sister    Mental illness Daughter    Stomach cancer Maternal Grandfather    COPD Other        Aunt   Diabetes Other        siblings   Pancreatic cancer Paternal Aunt    Esophageal cancer Neg Hx    Rectal cancer Neg Hx     Social History:  reports that he quit smoking about 36 years ago. His smoking use included cigarettes. He quit after 3.00 years of use. He quit smokeless tobacco use about 5 years ago.  His smokeless tobacco use included snuff. He reports previous alcohol use of about 3.0 standard drinks of alcohol per week. He reports that he does not use drugs.  Allergies:  Allergies  Allergen Reactions   Lactose Intolerance (Gi) Other (See Comments)    Gas and bloating   Latex Other (See Comments)    Skin irritation dsicoloration/blisters    Medications:  No current facility-administered medications on file prior to encounter.   Current Outpatient Medications on File Prior to Encounter  Medication Sig Dispense Refill   albuterol (PROVENTIL) (2.5 MG/3ML) 0.083% nebulizer solution Take 3 mLs (2.5 mg total) by nebulization every 6 (six) hours as needed for wheezing or shortness of breath. 150 mL 1   albuterol (VENTOLIN HFA) 108 (90 Base) MCG/ACT inhaler INHALE 2 PUFFS BY MOUTH 4 TIMES A DAY (Patient taking differently: Inhale 2 puffs into the lungs every 6 (six) hours as needed for wheezing or shortness of breath.) 8.5 g 5   amLODipine (NORVASC) 5 MG tablet TAKE 1 TABLET BY MOUTH EVERY DAY (Patient taking differently: Take 5 mg by mouth daily.) 30 tablet 5    cetirizine (ZYRTEC) 10 MG tablet Take 10 mg by mouth every morning.      cyclobenzaprine (FLEXERIL) 10 MG tablet TAKE 0.5-1 TABLETS (5-10 MG TOTAL) BY MOUTH AT BEDTIME AS NEEDED FOR MUSCLE SPASMS. (Patient taking differently: Take 10 mg by mouth at bedtime as needed for muscle spasms.) 30 tablet 1   FLOVENT HFA 110 MCG/ACT inhaler INHALE 1 PUFF BY MOUTH TWICE A DAY (Patient taking differently: Inhale 1 puff into the lungs in the morning and at bedtime.) 12 g 5   fluticasone (FLONASE) 50 MCG/ACT nasal spray SPRAY 2 SPRAYS INTO EACH NOSTRIL EVERY DAY (Patient  taking differently: Place 2 sprays into both nostrils daily.) 16 mL 11   indomethacin (INDOCIN) 50 MG capsule Take 1 capsule (50 mg total) by mouth 3 (three) times daily with meals. (Patient taking differently: Take 50 mg by mouth 2 (two) times daily with a meal.) 60 capsule 1   ketorolac (TORADOL) 60 MG/2ML SOLN injection Inject 60 mg into the muscle once.     methocarbamol (ROBAXIN) 750 MG tablet Take 750 mg by mouth in the morning and at bedtime.     omeprazole (PRILOSEC) 20 MG capsule TAKE 1 CAPSULE BY MOUTH EVERY DAY (Patient taking differently: Take 20 mg by mouth daily.) 30 capsule 11   OVER THE COUNTER MEDICATION Take 1 tablet by mouth daily.     oxyCODONE (ROXICODONE) 5 MG immediate release tablet Take 1 tablet (5 mg total) by mouth every 6 (six) hours as needed for moderate pain or severe pain. 20 tablet 0   sildenafil (VIAGRA) 100 MG tablet Take 0.5-1 tablets (50-100 mg total) by mouth daily as needed for erectile dysfunction. (Patient not taking: Reported on 12/13/2020) 5 tablet 11   [DISCONTINUED] albuterol (PROVENTIL,VENTOLIN) 90 MCG/ACT inhaler Inhale 2 puffs into the lungs every 6 (six) hours as needed for wheezing. 17 g 2     ROS: Constitutional: No fever or chills Vision: No changes in vision ENT: No difficulty swallowing CV: No chest pain Pulm: No SOB or wheezing GI: No nausea or vomiting GU: No urgency or inability to hold  urine Skin: No poor wound healing Neurologic: No numbness or tingling Psychiatric: No depression or anxiety Heme: No bruising Allergic: No reaction to medications or food   Exam: Blood pressure (!) 142/75, pulse 100, temperature 98.9 F (37.2 C), temperature source Oral, resp. rate (!) 22, height 5' 11.5" (1.816 m), weight 115.9 kg, SpO2 94 %. General: No acute distress Orientation: Awake alert and oriented x3 Mood and Affect: Cooperative and pleasant Gait: Did not assess due to patient being in bed rest Coordination and balance: Within normal limits  Left upper extremity: Arm is in sling.  Skin is clean dry and intact.  Crepitus and pain with palpation about his clavicle.  Unable to tolerate any significant range of motion of the shoulder.  No obvious deformity about the elbow or wrist.  He has intact motor and sensory function to median, radial and ulnar nerve distribution.  He has brisk cap refill less than 2 seconds with 2+ radial pulses.  Right upper extremity: Skin without lesions. No tenderness to palpation. Full painless ROM, full strength in each muscle groups without evidence of instability.   Medical Decision Making: Data: Imaging: X-rays of the left clavicle are reviewed which shows a comminuted midshaft clavicle fracture with notable shortening and displacement.  Labs:  Results for orders placed or performed during the hospital encounter of 12/13/20 (from the past 24 hour(s))  CBC     Status: Abnormal   Collection Time: 12/17/20  5:12 AM  Result Value Ref Range   WBC 8.0 4.0 - 10.5 K/uL   RBC 2.88 (L) 4.22 - 5.81 MIL/uL   Hemoglobin 8.9 (L) 13.0 - 17.0 g/dL   HCT 26.6 (L) 39.0 - 52.0 %   MCV 92.4 80.0 - 100.0 fL   MCH 30.9 26.0 - 34.0 pg   MCHC 33.5 30.0 - 36.0 g/dL   RDW 13.0 11.5 - 15.5 %   Platelets 149 (L) 150 - 400 K/uL   nRBC 0.5 (H) 0.0 - 0.2 %  Basic metabolic  panel     Status: Abnormal   Collection Time: 12/17/20  5:12 AM  Result Value Ref Range    Sodium 137 135 - 145 mmol/L   Potassium 3.8 3.5 - 5.1 mmol/L   Chloride 99 98 - 111 mmol/L   CO2 31 22 - 32 mmol/L   Glucose, Bld 118 (H) 70 - 99 mg/dL   BUN 11 6 - 20 mg/dL   Creatinine, Ser 0.91 0.61 - 1.24 mg/dL   Calcium 8.3 (L) 8.9 - 10.3 mg/dL   GFR, Estimated >60 >60 mL/min   Anion gap 7 5 - 15     Imaging or Labs ordered: None  Medical history and chart was reviewed and case discussed with medical provider.  Assessment/Plan: 55 year old right-hand-dominant male with a left clavicle fracture.  Dr. Griffin Basil have reviewed risks and benefits with the patient.  I reviewed them as well.  We discussed the high risk of nonunion with nonoperative management.  We also discussed the decreased risk of nonunion with ORIF.  Other risks I discussed included nerve and blood vessel injury including anterior chest numbness.  We also discussed the possibility of nerve or blood vessel injury, shoulder stiffness, shoulder dysfunction, DVT, even the possibility anesthetic complications.  The patient agreed to proceed with surgery and consent was obtained.  We will tentatively plan for surgery on Friday pending his stabilization from his splenic laceration.  Please make n.p.o. after midnight on Thursday evening  Shona Needles, MD [facility] [phone] (office) [email or web address]

## 2020-12-18 LAB — BASIC METABOLIC PANEL
Anion gap: 10 (ref 5–15)
BUN: 11 mg/dL (ref 6–20)
CO2: 28 mmol/L (ref 22–32)
Calcium: 8.2 mg/dL — ABNORMAL LOW (ref 8.9–10.3)
Chloride: 99 mmol/L (ref 98–111)
Creatinine, Ser: 0.95 mg/dL (ref 0.61–1.24)
GFR, Estimated: >60 mL/min (ref >60)
Glucose, Bld: 116 mg/dL — ABNORMAL HIGH (ref 70–99)
Potassium: 3.6 mmol/L (ref 3.5–5.1)
Sodium: 137 mmol/L (ref 135–145)

## 2020-12-18 LAB — CBC
HCT: 28.3 % — ABNORMAL LOW (ref 39.0–52.0)
Hemoglobin: 9.6 g/dL — ABNORMAL LOW (ref 13.0–17.0)
MCH: 30.7 pg (ref 26.0–34.0)
MCHC: 33.9 g/dL (ref 30.0–36.0)
MCV: 90.4 fL (ref 80.0–100.0)
Platelets: 174 10*3/uL (ref 150–400)
RBC: 3.13 MIL/uL — ABNORMAL LOW (ref 4.22–5.81)
RDW: 13.7 % (ref 11.5–15.5)
WBC: 8.1 10*3/uL (ref 4.0–10.5)
nRBC: 1 % — ABNORMAL HIGH (ref 0.0–0.2)

## 2020-12-18 MED ORDER — POLYETHYLENE GLYCOL 3350 17 G PO PACK
17.0000 g | PACK | Freq: Every day | ORAL | Status: DC
  Administered 2020-12-18 – 2020-12-20 (×3): 17 g via ORAL
  Filled 2020-12-18 (×3): qty 1

## 2020-12-18 MED ORDER — ENOXAPARIN SODIUM 30 MG/0.3ML IJ SOSY
30.0000 mg | PREFILLED_SYRINGE | Freq: Two times a day (BID) | INTRAMUSCULAR | Status: DC
Start: 2020-12-18 — End: 2020-12-20
  Administered 2020-12-18 – 2020-12-20 (×4): 30 mg via SUBCUTANEOUS
  Filled 2020-12-18 (×4): qty 0.3

## 2020-12-18 MED ORDER — AMLODIPINE BESYLATE 5 MG PO TABS
5.0000 mg | ORAL_TABLET | Freq: Every day | ORAL | Status: DC
  Administered 2020-12-18 – 2020-12-20 (×3): 5 mg via ORAL
  Filled 2020-12-18 (×3): qty 1

## 2020-12-18 MED ORDER — DOCUSATE SODIUM 100 MG PO CAPS
100.0000 mg | ORAL_CAPSULE | Freq: Every day | ORAL | Status: DC
  Administered 2020-12-18: 100 mg via ORAL
  Filled 2020-12-18: qty 1

## 2020-12-18 NOTE — Progress Notes (Signed)
Chart check:  Patient  appears to be hemodynamically stable, VSS and hgb stable.   Please call IR for questions and concerns regarding splenic arteriogram and embolization.    Armando Gang Winford Hehn PA-C 12/18/2020 10:56 AM

## 2020-12-18 NOTE — TOC Initial Note (Signed)
Transition of Care [facility]) - Initial/Assessment Note    Patient Details  Name: Clarence Alvarez MRN: [id number] Date of Birth: [birth year]  Transition of Care [facility]) CM/SW Contact:    Ella Bodo, RN Phone Number: 12/18/2020, 4:49 PM  Clinical Narrative:     Clarence Alvarez is a 55 y.o. male who presented to ED on 6/3 s/p mechanical fall at work, found to have a left clavicle fracture. He was discharged home, the following evening he experienced a syncopal episode. Pt returned to the ED 6/4 where he was found to have L 3rd - 10th rib fractures, anterior displaced fracture of sternal end of left clavicle and splenic laceration with hemoperitoneum.  Prior to admission, patient independent and living at home with spouse.  PT recommending outpatient rehab at discharge.  Patient for clavicle repair on 12/19/2020; will follow postoperatively for discharge needs.           Expected Discharge Plan: OP Rehab Barriers to Discharge: Continued Medical Work up   Patient Goals and CMS Choice        Expected Discharge Plan and Services Expected Discharge Plan: OP Rehab   Discharge Planning Services: CM Consult   Living arrangements for the past 2 months: Single Family Home                                      Prior Living Arrangements/Services Living arrangements for the past 2 months: Single Family Home Lives with:: Spouse Patient language and need for interpreter reviewed:: Yes Do you feel safe going back to the place where you live?: Yes      Need for Family Participation in Patient Care: Yes (Comment) Care giver support system in place?: Yes (comment)   Criminal Activity/Legal Involvement Pertinent to Current Situation/Hospitalization: No - Comment as needed  Activities of Daily Living Home Assistive Devices/Equipment: None, Eyeglasses, Dentures (specify type) ADL Screening (condition at time of admission) Patient's cognitive ability adequate to safely complete daily  activities?: Yes Is the patient deaf or have difficulty hearing?: No Does the patient have difficulty seeing, even when wearing glasses/contacts?: No Does the patient have difficulty concentrating, remembering, or making decisions?: No Patient able to express need for assistance with ADLs?: Yes Does the patient have difficulty dressing or bathing?: No Independently performs ADLs?: Yes (appropriate for developmental age) Does the patient have difficulty walking or climbing stairs?: No Weakness of Legs: None Weakness of Arms/Hands: None  Permission Sought/Granted                  Emotional Assessment Appearance:: Appears stated age Attitude/Demeanor/Rapport: Engaged Affect (typically observed): Accepting Orientation: : Oriented to Self, Oriented to Place, Oriented to  Time, Oriented to Situation      Admission diagnosis:  Hemoperitoneum [K66.1] Spleen laceration [S36.039A] Spleen injury, initial encounter [S36.00XA] Closed fracture of multiple ribs of left side, initial encounter [S22.42XA] Patient Active Problem List   Diagnosis Date Noted   Spleen laceration 12/13/2020   COVID-19 07/18/2020   Diabetes mellitus type 2, diet-controlled ([place]) 05/16/2019   Traumatic complete tear of right rotator cuff 11/30/2018   Essential hypertension 03/20/2018   Degeneration of intervertebral disc of cervical spine without disc herniation 02/29/2016   OSA (obstructive sleep apnea) 01/26/2013   ALLERGIC RHINITIS, SEASONAL 09/15/2009   Asthma 09/08/2006   PCP:  Owens Loffler, MD Pharmacy:   [id number] - [place], [address] [place] [address] Phone: [phone] Fax: [phone]     Social Determinants of Health (SDOH) Interventions    Readmission Risk Interventions No flowsheet data found.  Reinaldo Raddle, RN, BSN  Trauma/Neuro ICU Case Manager [phone]

## 2020-12-18 NOTE — Progress Notes (Addendum)
Patient ID: Clarence Alvarez, male   DOB: May 08, 1966, 55 y.o.   MRN: [id number]     Subjective:  Vomited, nausea better after ZOfran, no BM ROS negative except as listed above. Objective: Vital signs in last 24 hours: Temp:  [98.1 F (36.7 C)-98.9 F (37.2 C)] 98.5 F (36.9 C) (06/09 0809) Pulse Rate:  [83-177] 88 (06/09 0809) Resp:  [13-23] 16 (06/09 0809) BP: (129-176)/(62-100) 144/75 (06/09 0809) SpO2:  [92 %-98 %] 92 % (06/09 0809) Last BM Date: 12/17/20  Intake/Output from previous day: 06/08 0701 - 06/09 0700 In: 600 [P.O.:600] Out: 2395 [Urine:2395] Intake/Output this shift: Total I/O In: -  Out: 475 [Urine:475]  General appearance: alert and cooperative Resp: clear to auscultation bilaterally Cardio: regular rate and rhythm GI: soft, some distention, NT Extremities: calves soft Neurologic: Mental status: Alert, oriented, thought content appropriate  Lab Results: CBC  Recent Labs    12/17/20 0512 12/18/20 0300  WBC 8.0 8.1  HGB 8.9* 9.6*  HCT 26.6* 28.3*  PLT 149* 174   BMET Recent Labs    12/17/20 0512 12/18/20 0300  NA 137 137  K 3.8 3.6  CL 99 99  CO2 31 28  GLUCOSE 118* 116*  BUN 11 11  CREATININE 0.91 0.95  CALCIUM 8.3* 8.2*   PT/INR No results for input(s): LABPROT, INR in the last 72 hours. ABG No results for input(s): PHART, HCO3 in the last 72 hours.  Invalid input(s): PCO2, PO2  Studies/Results:   Anti-infectives: Anti-infectives (From admission, onward)    None       Assessment/Plan: FFH  Spleen laceration -  Had ongoing ooze so he underwent angioembolization 6/7 by Dr. Jarvis Newcomer. He still has a good amount of perfused spleen so he does not need vaccines. L clavicle fx - to OR tomorrow with Dr. Doreatha Martin for ORIF AKI - resolved ABL anemia - improved, Hb 9.6 FEN - reg diet, vomited, has not had BM, add Miralax and colace, zofran PRN DVT - SCDs, start LMWH Dispo - SDU, PT/OT, OR tomorrow I spoke with his wife at the  bedside.  LOS: 5 days    Georganna Skeans, MD, MPH, FACS Trauma & General Surgery Use [email or web address] to contact on call provider  12/18/2020

## 2020-12-19 ENCOUNTER — Encounter (HOSPITAL_COMMUNITY): Admission: EM | Disposition: A | Discharge status: Home / Self Care | Payer: Self-pay | Source: Home / Self Care

## 2020-12-19 ENCOUNTER — Inpatient Hospital Stay (HOSPITAL_COMMUNITY): Payer: No Typology Code available for payment source | Admitting: Anesthesiology

## 2020-12-19 ENCOUNTER — Encounter (HOSPITAL_COMMUNITY): Payer: Self-pay

## 2020-12-19 ENCOUNTER — Inpatient Hospital Stay (HOSPITAL_COMMUNITY): Payer: No Typology Code available for payment source

## 2020-12-19 HISTORY — PX: ORIF CLAVICULAR FRACTURE: SHX5055

## 2020-12-19 LAB — CBC
HCT: 26.9 % — ABNORMAL LOW (ref 39.0–52.0)
Hemoglobin: 8.9 g/dL — ABNORMAL LOW (ref 13.0–17.0)
MCH: 30.3 pg (ref 26.0–34.0)
MCHC: 33.1 g/dL (ref 30.0–36.0)
MCV: 91.5 fL (ref 80.0–100.0)
Platelets: 186 10*3/uL (ref 150–400)
RBC: 2.94 MIL/uL — ABNORMAL LOW (ref 4.22–5.81)
RDW: 14 % (ref 11.5–15.5)
WBC: 6.4 10*3/uL (ref 4.0–10.5)
nRBC: 1.1 % — ABNORMAL HIGH (ref 0.0–0.2)

## 2020-12-19 LAB — BASIC METABOLIC PANEL
Anion gap: 9 (ref 5–15)
BUN: 12 mg/dL (ref 6–20)
CO2: 29 mmol/L (ref 22–32)
Calcium: 8.5 mg/dL — ABNORMAL LOW (ref 8.9–10.3)
Chloride: 97 mmol/L — ABNORMAL LOW (ref 98–111)
Creatinine, Ser: 0.94 mg/dL (ref 0.61–1.24)
GFR, Estimated: >60 mL/min (ref >60)
Glucose, Bld: 129 mg/dL — ABNORMAL HIGH (ref 70–99)
Potassium: 3.3 mmol/L — ABNORMAL LOW (ref 3.5–5.1)
Sodium: 135 mmol/L (ref 135–145)

## 2020-12-19 LAB — GLUCOSE, CAPILLARY
Glucose-Capillary: 117 mg/dL — ABNORMAL HIGH (ref 70–99)
Glucose-Capillary: 129 mg/dL — ABNORMAL HIGH (ref 70–99)

## 2020-12-19 SURGERY — OPEN REDUCTION INTERNAL FIXATION (ORIF) CLAVICULAR FRACTURE
Anesthesia: General | Site: Chest | Laterality: Left

## 2020-12-19 MED ORDER — 0.9 % SODIUM CHLORIDE (POUR BTL) OPTIME
TOPICAL | Status: DC | PRN
  Administered 2020-12-19: 1000 mL

## 2020-12-19 MED ORDER — DEXAMETHASONE SODIUM PHOSPHATE 10 MG/ML IJ SOLN
INTRAMUSCULAR | Status: AC
  Filled 2020-12-19: qty 1

## 2020-12-19 MED ORDER — ONDANSETRON HCL 4 MG/2ML IJ SOLN
INTRAMUSCULAR | Status: DC | PRN
  Administered 2020-12-19: 4 mg via INTRAVENOUS

## 2020-12-19 MED ORDER — LIDOCAINE 2% (20 MG/ML) 5 ML SYRINGE
INTRAMUSCULAR | Status: DC | PRN
  Administered 2020-12-19: 100 mg via INTRAVENOUS

## 2020-12-19 MED ORDER — METHOCARBAMOL 500 MG PO TABS
ORAL_TABLET | ORAL | Status: AC
  Filled 2020-12-19: qty 1

## 2020-12-19 MED ORDER — HYDROMORPHONE HCL 1 MG/ML IJ SOLN
INTRAMUSCULAR | Status: AC
  Filled 2020-12-19: qty 1

## 2020-12-19 MED ORDER — OXYCODONE HCL 5 MG/5ML PO SOLN: 5.0000 mg | Freq: Once | ORAL | Status: DC | PRN

## 2020-12-19 MED ORDER — OXYCODONE HCL 5 MG PO TABS
ORAL_TABLET | ORAL | Status: AC
  Filled 2020-12-19: qty 2

## 2020-12-19 MED ORDER — VANCOMYCIN HCL 1000 MG IV SOLR
INTRAVENOUS | Status: AC
  Filled 2020-12-19: qty 1000

## 2020-12-19 MED ORDER — CEFAZOLIN SODIUM-DEXTROSE 2-4 GM/100ML-% IV SOLN: 2.0000 g | INTRAVENOUS | Status: DC

## 2020-12-19 MED ORDER — VANCOMYCIN HCL 1000 MG IV SOLR
INTRAVENOUS | Status: DC | PRN
  Administered 2020-12-19: 1000 mg via TOPICAL

## 2020-12-19 MED ORDER — OXYCODONE HCL 5 MG PO TABS: 5.0000 mg | ORAL_TABLET | Freq: Once | ORAL | Status: DC | PRN

## 2020-12-19 MED ORDER — BUPIVACAINE-EPINEPHRINE 0.5% -1:200000 IJ SOLN
INTRAMUSCULAR | Status: AC
  Filled 2020-12-19: qty 1

## 2020-12-19 MED ORDER — PROPOFOL 10 MG/ML IV BOLUS
INTRAVENOUS | Status: DC | PRN
  Administered 2020-12-19: 200 mg via INTRAVENOUS

## 2020-12-19 MED ORDER — LACTATED RINGERS IV SOLN: INTRAVENOUS | Status: DC | PRN

## 2020-12-19 MED ORDER — PROMETHAZINE HCL 25 MG/ML IJ SOLN: 6.2500 mg | INTRAMUSCULAR | Status: DC | PRN

## 2020-12-19 MED ORDER — CHLORHEXIDINE GLUCONATE 0.12 % MT SOLN: 15.0000 mL | OROMUCOSAL | Status: AC

## 2020-12-19 MED ORDER — MIDAZOLAM HCL 5 MG/5ML IJ SOLN
INTRAMUSCULAR | Status: DC | PRN
  Administered 2020-12-19: 2 mg via INTRAVENOUS

## 2020-12-19 MED ORDER — FENTANYL CITRATE (PF) 100 MCG/2ML IJ SOLN
INTRAMUSCULAR | Status: AC
  Filled 2020-12-19: qty 2

## 2020-12-19 MED ORDER — FENTANYL CITRATE (PF) 250 MCG/5ML IJ SOLN
INTRAMUSCULAR | Status: AC
  Filled 2020-12-19: qty 5

## 2020-12-19 MED ORDER — DOCUSATE SODIUM 100 MG PO CAPS
100.0000 mg | ORAL_CAPSULE | Freq: Two times a day (BID) | ORAL | Status: DC
  Administered 2020-12-19 – 2020-12-20 (×2): 100 mg via ORAL
  Filled 2020-12-19 (×2): qty 1

## 2020-12-19 MED ORDER — LACTATED RINGERS IV SOLN: INTRAVENOUS | Status: DC

## 2020-12-19 MED ORDER — DEXAMETHASONE SODIUM PHOSPHATE 10 MG/ML IJ SOLN
INTRAMUSCULAR | Status: DC | PRN
  Administered 2020-12-19: 5 mg via INTRAVENOUS

## 2020-12-19 MED ORDER — PROPOFOL 10 MG/ML IV BOLUS
INTRAVENOUS | Status: AC
  Filled 2020-12-19: qty 20

## 2020-12-19 MED ORDER — POTASSIUM CHLORIDE IN NACL 20-0.9 MEQ/L-% IV SOLN
INTRAVENOUS | Status: DC
  Filled 2020-12-19: qty 1000

## 2020-12-19 MED ORDER — MIDAZOLAM HCL 2 MG/2ML IJ SOLN
INTRAMUSCULAR | Status: AC
  Filled 2020-12-19: qty 2

## 2020-12-19 MED ORDER — BUPIVACAINE-EPINEPHRINE 0.5% -1:200000 IJ SOLN
INTRAMUSCULAR | Status: DC | PRN
  Administered 2020-12-19: 20 mL

## 2020-12-19 MED ORDER — ROCURONIUM BROMIDE 10 MG/ML (PF) SYRINGE
PREFILLED_SYRINGE | INTRAVENOUS | Status: DC | PRN
  Administered 2020-12-19: 100 mg via INTRAVENOUS

## 2020-12-19 MED ORDER — LIDOCAINE HCL (PF) 2 % IJ SOLN
INTRAMUSCULAR | Status: AC
  Filled 2020-12-19: qty 5

## 2020-12-19 MED ORDER — HYDROMORPHONE HCL 1 MG/ML IJ SOLN
0.2500 mg | INTRAMUSCULAR | Status: DC | PRN
  Administered 2020-12-19 (×3): 0.5 mg via INTRAVENOUS

## 2020-12-19 MED ORDER — CEFAZOLIN SODIUM-DEXTROSE 2-4 GM/100ML-% IV SOLN
2.0000 g | Freq: Three times a day (TID) | INTRAVENOUS | Status: DC
  Administered 2020-12-19 – 2020-12-20 (×2): 2 g via INTRAVENOUS
  Filled 2020-12-19 (×3): qty 100

## 2020-12-19 MED ORDER — POLYETHYLENE GLYCOL 3350 17 G PO PACK: 17.0000 g | PACK | Freq: Every day | ORAL | Status: DC | PRN

## 2020-12-19 MED ORDER — ONDANSETRON HCL 4 MG/2ML IJ SOLN
INTRAMUSCULAR | Status: AC
  Filled 2020-12-19: qty 2

## 2020-12-19 MED ORDER — SUGAMMADEX SODIUM 200 MG/2ML IV SOLN
INTRAVENOUS | Status: DC | PRN
  Administered 2020-12-19: 200 mg via INTRAVENOUS

## 2020-12-19 MED ORDER — FENTANYL CITRATE (PF) 250 MCG/5ML IJ SOLN
INTRAMUSCULAR | Status: DC | PRN
  Administered 2020-12-19: 100 ug via INTRAVENOUS
  Administered 2020-12-19: 50 ug via INTRAVENOUS

## 2020-12-19 MED ORDER — BACITRACIN ZINC 500 UNIT/GM EX OINT
TOPICAL_OINTMENT | CUTANEOUS | Status: AC
  Filled 2020-12-19: qty 28.35

## 2020-12-19 MED ORDER — CEFAZOLIN SODIUM-DEXTROSE 2-4 GM/100ML-% IV SOLN
2.0000 g | INTRAVENOUS | Status: AC
  Administered 2020-12-19: 2 g via INTRAVENOUS
  Filled 2020-12-19: qty 100

## 2020-12-19 MED ORDER — AMISULPRIDE (ANTIEMETIC) 5 MG/2ML IV SOLN: 10.0000 mg | Freq: Once | INTRAVENOUS | Status: DC | PRN

## 2020-12-19 MED ORDER — PHENYLEPHRINE HCL-NACL 10-0.9 MG/250ML-% IV SOLN
INTRAVENOUS | Status: DC | PRN
  Administered 2020-12-19: 40 ug/min via INTRAVENOUS

## 2020-12-19 MED ORDER — CHLORHEXIDINE GLUCONATE 0.12 % MT SOLN
OROMUCOSAL | Status: AC
  Administered 2020-12-19: 15 mL via OROMUCOSAL
  Filled 2020-12-19: qty 15

## 2020-12-19 MED ORDER — MEPERIDINE HCL 25 MG/ML IJ SOLN: 6.2500 mg | INTRAMUSCULAR | Status: DC | PRN

## 2020-12-19 SURGICAL SUPPLY — 55 items
ADH SKN CLS APL DERMABOND .7 (GAUZE/BANDAGES/DRESSINGS) ×3
ADH SKN CLS LQ APL DERMABOND (GAUZE/BANDAGES/DRESSINGS) ×1
APL PRP STRL LF DISP 70% ISPRP (MISCELLANEOUS) ×1
BIT DRILL CLAV ALPS 2.7X145 (BIT) ×1 IMPLANT
BNDG COHESIVE 4X5 TAN STRL (GAUZE/BANDAGES/DRESSINGS) IMPLANT
BRUSH SCRUB EZ PLAIN DRY (MISCELLANEOUS) ×4 IMPLANT
CHLORAPREP W/TINT 26 (MISCELLANEOUS) ×2 IMPLANT
COVER SURGICAL LIGHT HANDLE (MISCELLANEOUS) ×4 IMPLANT
DERMABOND ADHESIVE PROPEN (GAUZE/BANDAGES/DRESSINGS) ×1
DERMABOND ADVANCED (GAUZE/BANDAGES/DRESSINGS) ×3
DERMABOND ADVANCED .7 DNX12 (GAUZE/BANDAGES/DRESSINGS) ×2 IMPLANT
DERMABOND ADVANCED .7 DNX6 (GAUZE/BANDAGES/DRESSINGS) IMPLANT
DRAPE C-ARM 42X72 X-RAY (DRAPES) ×2 IMPLANT
DRAPE INCISE IOBAN 66X45 STRL (DRAPES) ×2 IMPLANT
DRAPE ORTHO SPLIT 77X108 STRL (DRAPES) ×4
DRAPE SURG ORHT 6 SPLT 77X108 (DRAPES) ×2 IMPLANT
DRAPE U-SHAPE 47X51 STRL (DRAPES) ×4 IMPLANT
DRSG MEPILEX BORDER 4X8 (GAUZE/BANDAGES/DRESSINGS) ×2 IMPLANT
ELECT REM PT RETURN 9FT ADLT (ELECTROSURGICAL) ×2
ELECTRODE REM PT RTRN 9FT ADLT (ELECTROSURGICAL) ×1 IMPLANT
GLOVE BIO SURGEON STRL SZ 6.5 (GLOVE) ×6 IMPLANT
GLOVE BIO SURGEON STRL SZ7.5 (GLOVE) ×6 IMPLANT
GLOVE BIOGEL PI IND STRL 7.5 (GLOVE) ×1 IMPLANT
GLOVE BIOGEL PI INDICATOR 7.5 (GLOVE) ×1
GLOVE SURG UNDER POLY LF SZ6.5 (GLOVE) ×2 IMPLANT
GOWN STRL REUS W/ TWL LRG LVL3 (GOWN DISPOSABLE) ×2 IMPLANT
GOWN STRL REUS W/TWL LRG LVL3 (GOWN DISPOSABLE) ×4
K-WIRE TROCHAR TIP ALPS 1.6 (WIRE) ×4
KIT BASIN OR (CUSTOM PROCEDURE TRAY) ×2 IMPLANT
KIT TURNOVER KIT B (KITS) ×2 IMPLANT
KWIRE TROCHAR TIP ALPS 1.6 (WIRE) IMPLANT
MANIFOLD NEPTUNE II (INSTRUMENTS) ×2 IMPLANT
NDL HYPO 25GX1X1/2 BEV (NEEDLE) IMPLANT
NEEDLE HYPO 25GX1X1/2 BEV (NEEDLE) IMPLANT
NS IRRIG 1000ML POUR BTL (IV SOLUTION) ×2 IMPLANT
PACK GENERAL/GYN (CUSTOM PROCEDURE TRAY) ×2 IMPLANT
PAD ARMBOARD 7.5X6 YLW CONV (MISCELLANEOUS) ×4 IMPLANT
PLATE CLAV SUP 125 12H LT (Plate) ×1 IMPLANT
SCREW CORT LP T15 3.5X16 (Screw) ×2 IMPLANT
SCREW LP NL T15 3.5X20 (Screw) ×3 IMPLANT
SCREW TIS LP 3.5X18 NS (Screw) ×3 IMPLANT
SLING ARM IMMOBILIZER LRG (SOFTGOODS) IMPLANT
SLING ARM IMMOBILIZER MED (SOFTGOODS) IMPLANT
STAPLER VISISTAT 35W (STAPLE) ×2 IMPLANT
STOCKINETTE IMPERVIOUS 9X36 MD (GAUZE/BANDAGES/DRESSINGS) IMPLANT
SUCTION FRAZIER HANDLE 10FR (MISCELLANEOUS) ×2
SUCTION TUBE FRAZIER 10FR DISP (MISCELLANEOUS) ×1 IMPLANT
SUT MNCRL AB 3-0 PS2 27 (SUTURE) ×2 IMPLANT
SUT VIC AB 0 CT1 27 (SUTURE) ×2
SUT VIC AB 0 CT1 27XBRD ANBCTR (SUTURE) ×1 IMPLANT
SUT VIC AB 2-0 CT1 27 (SUTURE) ×2
SUT VIC AB 2-0 CT1 TAPERPNT 27 (SUTURE) ×1 IMPLANT
SYR CONTROL 10ML LL (SYRINGE) IMPLANT
TOWEL GREEN STERILE (TOWEL DISPOSABLE) ×2 IMPLANT
WATER STERILE IRR 1000ML POUR (IV SOLUTION) ×2 IMPLANT

## 2020-12-19 NOTE — Anesthesia Preprocedure Evaluation (Signed)
Anesthesia Evaluation  Patient identified by MRN, date of birth, ID band Patient awake    Reviewed: Allergy & Precautions, NPO status , Patient's Chart, lab work & pertinent test results  Airway Mallampati: II  TM Distance: >3 FB Neck ROM: Full    Dental  (+) Upper Dentures   Pulmonary asthma , sleep apnea , former smoker,    Pulmonary exam normal breath sounds clear to auscultation       Cardiovascular hypertension, Pt. on medications Normal cardiovascular exam Rhythm:Regular Rate:Normal     Neuro/Psych negative neurological ROS  negative psych ROS   GI/Hepatic Neg liver ROS, GERD  Medicated and Controlled,  Endo/Other  Obesity  Renal/GU Hx/o renal calculus  negative genitourinary   Musculoskeletal  (+) Arthritis , Osteoarthritis,  Torn right rotator cuff Impingement syndrome right shoulder   Abdominal (+) + obese,   Peds  Hematology negative hematology ROS (+)   Anesthesia Other Findings   Reproductive/Obstetrics                             Anesthesia Physical  Anesthesia Plan  ASA: II  Anesthesia Plan: General   Post-op Pain Management:    Induction: Intravenous  PONV Risk Score and Plan: 2 and Midazolam, Ondansetron and Treatment may vary due to age or medical condition  Airway Management Planned: Oral ETT  Additional Equipment:   Intra-op Plan:   Post-operative Plan: Extubation in OR  Informed Consent: I have reviewed the patients History and Physical, chart, labs and discussed the procedure including the risks, benefits and alternatives for the proposed anesthesia with the patient or authorized representative who has indicated his/her understanding and acceptance.     Dental advisory given  Plan Discussed with: CRNA and Surgeon  Anesthesia Plan Comments:         Anesthesia Quick Evaluation

## 2020-12-19 NOTE — Progress Notes (Addendum)
At shift change patient said he needed to tell me that he loved me and would pray for me but I did not give him good service today. He said he felt this way because his IV fell out after surgery. The IV placed before surgery had the orange tape on it and I tried to reinforce it with transpore tape when he arrived on the floor. But after I found the IV deassessed I put gauze and tape over the site. I also consulted IV team to replace his IV.

## 2020-12-19 NOTE — Op Note (Signed)
Orthopaedic Surgery Operative Note (CSN: [id number] ) Date of Surgery: 12/19/2020  Admit Date: 12/13/2020   Diagnoses: Pre-Op Diagnoses: Left clavicle fracture  Post-Op Diagnosis: Same  Procedures: CPT 23515-Open reduction internal fixation of left clavicle  Surgeons : Primary: Shona Needles, MD  Assistant: Patrecia Pace, PA-C  Location: OR 3   Anesthesia:General   Antibiotics: Ancef 2g preop with 1 gm vancomycin powder placed topically   Tourniquet time:None    Estimated Blood OYDX:41 mL  Complications:None  Specimens:None  Implants: Implant Name Type Inv. Item Serial No. Manufacturer Lot No. LRB No. Used Action  PLATE CLAV SUP [id number] [id number] LT - [id number] Plate PLATE CLAV SUP 628 36O LT  [employer](ORTH,TRAU,BIO,SG)  Left 2 Implanted  SCREW LP NL T15 3.5X20 - [id number] Screw SCREW LP NL T15 3.5X20  [employer](ORTH,TRAU,BIO,SG)  Left 3 Implanted  SCREW TIS LP 3.5X18 NS - [id number] Screw SCREW TIS LP 3.5X18 NS  [employer](ORTH,TRAU,BIO,SG)  Left 3 Implanted  SCREW CORT LP T15 3.5X16 - [id number] Screw SCREW CORT LP T15 3.5X16  [employer](ORTH,TRAU,BIO,SG)  Left 2 Implanted     Indications for Surgery: 55 year old male who injured himself while at work.  He sustained a left clavicle fracture.  Due to the unstable nature of his injury I recommended proceeding with open reduction internal fixation.  Risks and benefits were discussed with the patient.  Risks included but not limited to bleeding, infection, malunion, nonunion, hardware failure, hardware irritation, nerve and blood vessel injury, shoulder stiffness, even the possibility anesthetic complications.  Patient agreed to proceed with surgery consent was obtained.  Operative Findings: Open reduction internal fixation of left clavicle fracture using [employer] ALPS 10 hole superior clavicle plate  Procedure: The patient was identified in the preoperative holding area. Consent was confirmed with the patient and their  family and all questions were answered. The operative extremity was marked after confirmation with the patient. he was then brought back to the operating room by our anesthesia colleagues.  He was placed under general anesthetic and carefully transferred over to a radiolucent flat top table.  He is left shoulder and chest was prepped and draped in usual sterile fashion.  Timeout was performed to verify the patient, the procedure, and the extremity.  Preoperative antibiotics were dosed.  Fluoroscopic imaging showed the unstable nature of his injury.  A incision directly over the clavicle was carried down through skin and subcutaneous tissue.  I incised through the platysma muscle and carefully dissected through the soft tissues to use a the supraclavicular nerve branches.  I kept all of the soft tissue in place overlying fracture as I felt that there was severe comminution that would not be amenable to primary fixation.  As result I felt that bridge fixation would be appropriate.  I developed the plane between the bone and the soft tissues to slide a 10 hole plate underneath in line to appropriately on the medial and lateral portions of the clavicle.  I held it provisionally with 2.0 mm K wires.  I confirmed positioning of the plate with fluoroscopy and reduction of the fracture.  I then proceeded to place nonlocking screws medial lateral to the fracture site.  Again I confirmed with fluoroscopy that alignment was appropriate and position of the plate was appropriate.  I then proceeded to place a total of 4 screws medial and 4 screws lateral to the fracture site.  Final fluoroscopic imaging was obtained.  The incision was copiously irrigated.  A gram of vancomycin  powder was placed into the incision.  A layer closure of 0 Vicryl, 2-0 Vicryl, 3-0 Monocryl and Dermabond was used to close the skin.  Sterile dressing was placed.  The patient was then awoken from anesthesia and taken to the PACU in stable  condition.  Post Op Plan/Instructions: Patient will be nonweightbearing to the left upper extremity.  He will receive postoperative Ancef.  He will receive Lovenox for DVT prophylaxis once his hemoglobin is stabilized.  He will have unrestricted range of motion of the left shoulder and upper extremity for passive and active range of motion.  He may discharge from an orthopedic perspective once cleared from the trauma surgery team.  I was present and performed the entire surgery.  Patrecia Pace, PA-C did assist me throughout the case. An assistant was necessary given the difficulty in approach, maintenance of reduction and ability to instrument the fracture.   Katha Hamming, MD [facility]

## 2020-12-19 NOTE — Discharge Instructions (Addendum)
Orthopaedic Trauma Service Discharge Instructions   General Discharge Instructions  WEIGHT BEARING STATUS: Non-weightbearing left upper extremity  RANGE OF MOTION/ACTIVITY: Ok for shoulder motion as tolerated. Use for sling for comfort.   Wound Care: You may remove your dressing on Sunday 12/21/20. Incisions can be left open to air if there is no drainage. If incision continues to have drainage, follow wound care instructions below. Okay to shower if no drainage from incisions.  DVT/PE prophylaxis: Aspirin  Diet: as you were eating previously.  Can use over the counter stool softeners and bowel preparations, such as Miralax, to help with bowel movements.  Narcotics can be constipating.  Be sure to drink plenty of fluids  PAIN MEDICATION USE AND EXPECTATIONS  You have likely been given narcotic medications to help control your pain.  After a traumatic event that results in an fracture (broken bone) with or without surgery, it is ok to use narcotic pain medications to help control one's pain.  We understand that everyone responds to pain differently and each individual patient will be evaluated on a regular basis for the continued need for narcotic medications. Ideally, narcotic medication use should last no more than 6-8 weeks (coinciding with fracture healing).   As a patient it is your responsibility as well to monitor narcotic medication use and report the amount and frequency you use these medications when you come to your office visit.   We would also advise that if you are using narcotic medications, you should take a dose prior to therapy to maximize you participation.  IF YOU ARE ON NARCOTIC MEDICATIONS IT IS NOT PERMISSIBLE TO OPERATE A MOTOR VEHICLE (MOTORCYCLE/CAR/TRUCK/MOPED) OR HEAVY MACHINERY DO NOT MIX NARCOTICS WITH OTHER CNS (CENTRAL NERVOUS SYSTEM) DEPRESSANTS SUCH AS ALCOHOL   STOP SMOKING OR USING NICOTINE PRODUCTS!!!!  As discussed nicotine severely impairs your body's  ability to heal surgical and traumatic wounds but also impairs bone healing.  Wounds and bone heal by forming microscopic blood vessels (angiogenesis) and nicotine is a vasoconstrictor (essentially, shrinks blood vessels).  Therefore, if vasoconstriction occurs to these microscopic blood vessels they essentially disappear and are unable to deliver necessary nutrients to the healing tissue.  This is one modifiable factor that you can do to dramatically increase your chances of healing your injury.    (This means no smoking, no nicotine gum, patches, etc)  DO NOT USE NONSTEROIDAL ANTI-INFLAMMATORY DRUGS (NSAID'S)  Using products such as Advil (ibuprofen), Aleve (naproxen), Motrin (ibuprofen) for additional pain control during fracture healing can delay and/or prevent the healing response.  If you would like to take over the counter (OTC) medication, Tylenol (acetaminophen) is ok.  However, some narcotic medications that are given for pain control contain acetaminophen as well. Therefore, you should not exceed more than 4000 mg of tylenol in a day if you do not have liver disease.  Also note that there are may OTC medicines, such as cold medicines and allergy medicines that my contain tylenol as well.  If you have any questions about medications and/or interactions please ask your doctor/PA or your pharmacist.      ICE AND ELEVATE INJURED/OPERATIVE EXTREMITY  Using ice and elevating the injured extremity above your heart can help with swelling and pain control.  Icing in a pulsatile fashion, such as 20 minutes on and 20 minutes off, can be followed.    Do not place ice directly on skin. Make sure there is a barrier between to skin and the ice pack.  Using frozen items such as frozen peas works well as the conform nicely to the are that needs to be iced.  USE AN ACE WRAP OR TED HOSE FOR SWELLING CONTROL  In addition to icing and elevation, Ace wraps or TED hose are used to help limit and resolve swelling.   It is recommended to use Ace wraps or TED hose until you are informed to stop.    When using Ace Wraps start the wrapping distally (farthest away from the body) and wrap proximally (closer to the body)   Example: If you had surgery on your leg or thing and you do not have a splint on, start the ace wrap at the toes and work your way up to the thigh        If you had surgery on your upper extremity and do not have a splint on, start the ace wrap at your fingers and work your way up to the upper arm   [place] QUESTIONS/CONCERNS OR MEDICATION REFILL: [phone]   VISIT OUR WEBSITE FOR ADDITIONAL INFORMATION: [email or web address]    Discharge Wound Care Instructions  Do NOT apply any ointments, solutions or lotions to pin sites or surgical wounds.  These prevent needed drainage and even though solutions like hydrogen peroxide kill bacteria, they also damage cells lining the pin sites that help fight infection.  Applying lotions or ointments can keep the wounds moist and can cause them to breakdown and open up as well. This can increase the risk for infection. When in doubt call the office.  If any drainage is noted, use foam dressing.  Once the incision is completely dry and without drainage, it may be left open to air out.  Showering may begin 36-48 hours later.  Cleaning gently with soap and water.

## 2020-12-19 NOTE — Progress Notes (Signed)
Physical Therapy Treatment Patient Details Name: Clarence Alvarez MRN: [id number] DOB: 1966/05/29 Today's Date: 12/19/2020    History of Present Illness Clarence Alvarez is a 55 y.o. male who presented to [facility] ED on 6/3 s/p mechanical fall at work, found to have a left clavicle fracture. He was discharged home, the following evening he experienced a syncopal episode. Pt returned to the ED 6/4 where he was found to have L 3rd - 10th rib fractures, anterior displaced fracture of sternal end of left clavicle and splenic laceration with hemoperitoneum. 6/7 splenic arteriogram and embolization.  Per Dr. Griffin Basil, holding off on clavicle surgery P stability. Pt with a past medical history significant for OSA, asthma, HTN and DM .    PT Comments    Pt is demonstrating improved balance with functional mobility, but did display some trunk sway/instability with initially powering up to stand from a low surface without utilizing his arms and when changing his head position (worse when rotating rather than superior/inferior changes) while ambulating. However, pt was able to recover his balance with min guard-supervision. Pt was also able to negotiate x6 stairs with min guard assist, allowing him to safely enter/exit his home. Will continue to follow acutely. Recommend following surgeon's recs for follow-up therapy needs based on necessity for ROM or strengthening of his L UE as permitted following planned surgery this date.   Follow Up Recommendations  Supervision for mobility/OOB;Follow surgeon's recommendation for DC plan and follow-up therapies     Equipment Recommendations  None recommended by PT    Recommendations for Other Services       Precautions / Restrictions Precautions Precautions: Fall Precaution Comments: pt pending L clavicle sx Required Braces or Orthoses: Sling Restrictions Weight Bearing Restrictions: Yes LUE Weight Bearing: Non weight bearing    Mobility  Bed Mobility Overal bed  mobility: Modified Independent             General bed mobility comments: Pt performs all bed mobility safely with HOB elevated.    Transfers Overall transfer level: Needs assistance Equipment used: None Transfers: Sit to/from Stand Sit to Stand: Min guard         General transfer comment: Sit <> stand from low EOB with min guard for safety as pt displaying a posterior trunk sway upon initially powering up to stand, able to recover with min guard.  Ambulation/Gait Ambulation/Gait assistance: Min guard;Supervision Gait Distance (Feet): 400 Feet Assistive device: None Gait Pattern/deviations: Step-through pattern;Decreased stride length Gait velocity: reduced Gait velocity interpretation: >4.37 ft/sec, indicative of normal walking speed (when cued to increase speed) General Gait Details: Pt with improved bil step length and steadiness. Pt ambulates at slow pace, but can change gait speeds upon being cued. Pt with balance deficits when changing head positions, min guard-supervision for safety.   Stairs Stairs: Yes Stairs assistance: Min guard Stair Management: One rail Right;Alternating pattern;Step to pattern;Forwards Number of Stairs: 6 General stair comments: Ascends initial x3 stairs with R rail but remaining 3 without rail and descends without rail without LOB, min guard for safety. Reciprocal pattern ascending, step-to descending. Educated pt and his wife on guarding and providing support as needed to maintain safety at home.   Wheelchair Mobility    Modified Rankin (Stroke Patients Only) Modified Rankin (Stroke Patients Only) Pre-Morbid Rankin Score: No symptoms Modified Rankin: Moderately severe disability     Balance Overall balance assessment: Mild deficits observed, not formally tested  Cognition Arousal/Alertness: Awake/alert Behavior During Therapy: WFL for tasks assessed/performed Overall  Cognitive Status: Within Functional Limits for tasks assessed                                 General Comments: A&Ox4, appears to be aware of his deficits and safety.      Exercises      General Comments        Pertinent Vitals/Pain Pain Assessment: Faces Faces Pain Scale: Hurts little more Pain Location: L collar bone, L ribs Pain Descriptors / Indicators: Discomfort;Grimacing;Guarding Pain Intervention(s): Limited activity within patient's tolerance;Monitored during session;Repositioned    Home Living                      Prior Function            PT Goals (current goals can now be found in the care plan section) Acute Rehab PT Goals Patient Stated Goal: to improve PT Goal Formulation: With patient Time For Goal Achievement: 12/31/20 Potential to Achieve Goals: Good Progress towards PT goals: Progressing toward goals    Frequency    Min 3X/week      PT Plan Discharge plan needs to be updated    Co-evaluation              AM-PAC PT "6 Clicks" Mobility   Outcome Measure  Help needed turning from your back to your side while in a flat bed without using bedrails?: None Help needed moving from lying on your back to sitting on the side of a flat bed without using bedrails?: None Help needed moving to and from a bed to a chair (including a wheelchair)?: A Little Help needed standing up from a chair using your arms (e.g., wheelchair or bedside chair)?: A Little Help needed to walk in hospital room?: A Little Help needed climbing 3-5 steps with a railing? : A Little 6 Click Score: 20    End of Session Equipment Utilized During Treatment: Gait belt;Other (comment) (sling) Activity Tolerance: Patient tolerated treatment well Patient left: with call bell/phone within reach;in bed;with bed alarm set;with family/visitor present Nurse Communication: Mobility status PT Visit Diagnosis: Unsteadiness on feet (R26.81);Other abnormalities of  gait and mobility (R26.89);History of falling (Z91.81);Difficulty in walking, not elsewhere classified (R26.2);Pain Pain - Right/Left: Left Pain - part of body:  (clavicle)     Time: 5035-4656 PT Time Calculation (min) (ACUTE ONLY): 20 min  Charges:  $Gait Training: 8-22 mins                     Moishe Spice, PT, DPT Acute Rehabilitation Services  Pager: [phone] Office: [phone]    Orvan Falconer 12/19/2020, 8:56 AM

## 2020-12-19 NOTE — Interval H&P Note (Signed)
History and Physical Interval Note:  12/19/2020 9:33 AM  Clarence Alvarez  has presented today for surgery, with the diagnosis of Left clavicle fracture.  The various methods of treatment have been discussed with the patient and family. After consideration of risks, benefits and other options for treatment, the patient has consented to  Procedure(s): OPEN REDUCTION INTERNAL FIXATION (ORIF) CLAVICULAR FRACTURE (Left) as a surgical intervention.  The patient's history has been reviewed, patient examined, no change in status, stable for surgery.  I have reviewed the patient's chart and labs.  Questions were answered to the patient's satisfaction.     Lennette Bihari P Herta Hink

## 2020-12-19 NOTE — Progress Notes (Signed)
Orthopedic Tech Progress Note Patient Details:  Clarence Alvarez 1965-12-24 [id number]  Ortho Devices Type of Ortho Device: Shoulder immobilizer Ortho Device/Splint Interventions: Ordered, Application   Post Interventions Patient Tolerated: Well  [name] 12/19/2020, 4:40 PM

## 2020-12-19 NOTE — Anesthesia Procedure Notes (Addendum)
Procedure Name: Intubation Date/Time: 12/19/2020 10:31 AM Performed by: Kyung Rudd, CRNA Pre-anesthesia Checklist: Patient identified, Emergency Drugs available, Suction available, Patient being monitored and Timeout performed Patient Re-evaluated:Patient Re-evaluated prior to induction Oxygen Delivery Method: Circle system utilized Preoxygenation: Pre-oxygenation with 100% oxygen Induction Type: IV induction Ventilation: Mask ventilation without difficulty and Oral airway inserted - appropriate to patient size Laryngoscope Size: Mac and 4 Grade View: Grade I Tube type: Oral Tube size: 7.5 mm Number of attempts: 1 Airway Equipment and Method: Stylet Placement Confirmation: ETT inserted through vocal cords under direct vision, positive ETCO2 and breath sounds checked- equal and bilateral Secured at: 22 cm Tube secured with: Tape Dental Injury: Teeth and Oropharynx as per pre-operative assessment

## 2020-12-19 NOTE — Progress Notes (Signed)
Patient said he wanted a breathing treatment this evening and the respiratory therapist could not access the medication in our pyxis machine and she said she would call pharmacy. The patient called out and the treatment had not been done so I called pharmacy and they sent up a prn medication. I called the respiratory therapist and she came back to do the breathing treatment.

## 2020-12-19 NOTE — Progress Notes (Signed)
Patient ID: Clarence Alvarez, male   DOB: 1966-01-27, 55 y.o.   MRN: [id number]     Subjective: Did better eating smaller meals yesterday ROS negative except as listed above. Objective: Vital signs in last 24 hours: Temp:  [97.7 F (36.5 C)-98.7 F (37.1 C)] 98.6 F (37 C) (06/10 0733) Pulse Rate:  [84-102] 89 (06/10 0733) Resp:  [17-20] 17 (06/10 0733) BP: (133-166)/(77-92) 133/82 (06/10 0733) SpO2:  [90 %-100 %] 98 % (06/10 0733) Last BM Date: 12/17/20  Intake/Output from previous day: 06/09 0701 - 06/10 0700 In: 600 [P.O.:600] Out: 475 [Urine:475] Intake/Output this shift: No intake/output data recorded.  General appearance: alert and cooperative Resp: clear to auscultation bilaterally Chest wall: left sided chest wall tenderness Cardio: regular rate and rhythm GI: soft, NT Extremities: sling LUE Neurologic: Mental status: Alert, oriented, thought content appropriate  Lab Results: CBC  Recent Labs    12/18/20 0300 12/19/20 0347  WBC 8.1 6.4  HGB 9.6* 8.9*  HCT 28.3* 26.9*  PLT 174 186   BMET Recent Labs    12/18/20 0300 12/19/20 0347  NA 137 135  K 3.6 3.3*  CL 99 97*  CO2 28 29  GLUCOSE 116* 129*  BUN 11 12  CREATININE 0.95 0.94  CALCIUM 8.2* 8.5*   PT/INR No results for input(s): LABPROT, INR in the last 72 hours. ABG No results for input(s): PHART, HCO3 in the last 72 hours.  Invalid input(s): PCO2, PO2  Studies/Results: No results found.  Anti-infectives: Anti-infectives (From admission, onward)    Start     Dose/Rate Route Frequency Ordered Stop   12/19/20 1030  ceFAZolin (ANCEF) IVPB 2g/100 mL premix        2 g 200 mL/hr over 30 Minutes Intravenous To [facility] 12/19/20 0717 12/20/20 1030   12/19/20 0730  ceFAZolin (ANCEF) IVPB 2g/100 mL premix  Status:  Discontinued        2 g 200 mL/hr over 30 Minutes Intravenous To [facility] 12/19/20 6222 12/19/20 0717       Assessment/Plan: [facility]  Spleen laceration -  Had  ongoing ooze so he underwent angioembolization 6/7 by Dr. Jarvis Newcomer. He still has a good amount of perfused spleen so he does not need vaccines. L clavicle fx - to OR today with Dr. Doreatha Martin for ORIF AKI - resolved ABL anemia - improved, Hb 8.9 and PLTs up FEN - reg diet, doing better with small meals, splenic hematoma likely pressing on back of stomach DVT - SCDs, LMWH Dispo - SDU, PT/OT, OR today, D/C next day or two I spoke with his family at the bedside.  LOS: 6 days    Georganna Skeans, MD, MPH, FACS Trauma & General Surgery Use [email or web address] to contact on call provider  12/19/2020

## 2020-12-19 NOTE — Progress Notes (Signed)
VAST consulted to obtain IV access. Upon arrival at pt's bedside, this RN removed tape and gauze from previous IV insertion sites. Old IV site in lateral right wrist with small amount of pus noted by patient. Cleaned site with alcohol and applied new gauze dressing. No redness, swelling, or pain noted around site.  IV obtained in right anterior forearm utilizing ultrasound.  Reported findings to oncoming nurse Nehemiah Settle and both visually assessed old IV site. She verbalized she will continue to monitor.

## 2020-12-19 NOTE — Anesthesia Postprocedure Evaluation (Signed)
Anesthesia Post Note  Patient: Clarence Alvarez  Procedure(s) Performed: OPEN REDUCTION INTERNAL FIXATION (ORIF) CLAVICULAR FRACTURE (Left: Chest)     Patient location during evaluation: PACU Anesthesia Type: General Level of consciousness: awake and alert Pain management: pain level controlled Vital Signs Assessment: post-procedure vital signs reviewed and stable Respiratory status: spontaneous breathing, nonlabored ventilation and respiratory function stable Cardiovascular status: blood pressure returned to baseline and stable Postop Assessment: no apparent nausea or vomiting Anesthetic complications: no   No notable events documented.  Last Vitals:  Vitals:   12/19/20 1200 12/19/20 1230  BP: (!) 151/90 128/88  Pulse: 100 95  Resp: (!) 28 (!) 24  Temp: 36.4 C   SpO2: 92% 94%    Last Pain:  Vitals:   12/19/20 1242  TempSrc:   PainSc: [place]

## 2020-12-19 NOTE — Transfer of Care (Signed)
Immediate Anesthesia Transfer of Care Note  Patient: Clarence Alvarez  Procedure(s) Performed: OPEN REDUCTION INTERNAL FIXATION (ORIF) CLAVICULAR FRACTURE (Left: Chest)  Patient Location: PACU  Anesthesia Type:General  Level of Consciousness: awake, alert  and oriented  Airway & Oxygen Therapy: Patient Spontanous Breathing and Patient connected to nasal cannula oxygen  Post-op Assessment: Report given to RN, Post -op Vital signs reviewed and stable and Patient moving all extremities X 4  Post vital signs: Reviewed and stable  Last Vitals:  Vitals Value Taken Time  BP 151/90 12/19/20 1159  Temp    Pulse 95 12/19/20 1203  Resp 15 12/19/20 1203  SpO2 94 % 12/19/20 1203  Vitals shown include unvalidated device data.  Last Pain:  Vitals:   12/19/20 0935  TempSrc: Oral  PainSc:       Patients Stated Pain Goal: 2 (24/40/10 2725)  Complications: No notable events documented.

## 2020-12-20 DIAGNOSIS — S42009A Fracture of unspecified part of unspecified clavicle, initial encounter for closed fracture: Secondary | ICD-10-CM | POA: Diagnosis present

## 2020-12-20 LAB — BASIC METABOLIC PANEL
Anion gap: 9 (ref 5–15)
BUN: 13 mg/dL (ref 6–20)
CO2: 26 mmol/L (ref 22–32)
Calcium: 8.4 mg/dL — ABNORMAL LOW (ref 8.9–10.3)
Chloride: 99 mmol/L (ref 98–111)
Creatinine, Ser: 0.86 mg/dL (ref 0.61–1.24)
GFR, Estimated: >60 mL/min (ref >60)
Glucose, Bld: 133 mg/dL — ABNORMAL HIGH (ref 70–99)
Potassium: 4.4 mmol/L (ref 3.5–5.1)
Sodium: 134 mmol/L — ABNORMAL LOW (ref 135–145)

## 2020-12-20 LAB — CBC
HCT: 27.4 % — ABNORMAL LOW (ref 39.0–52.0)
Hemoglobin: 8.9 g/dL — ABNORMAL LOW (ref 13.0–17.0)
MCH: 30 pg (ref 26.0–34.0)
MCHC: 32.5 g/dL (ref 30.0–36.0)
MCV: 92.3 fL (ref 80.0–100.0)
Platelets: 229 10*3/uL (ref 150–400)
RBC: 2.97 MIL/uL — ABNORMAL LOW (ref 4.22–5.81)
RDW: 14.2 % (ref 11.5–15.5)
WBC: 8.7 10*3/uL (ref 4.0–10.5)
nRBC: 0.5 % — ABNORMAL HIGH (ref 0.0–0.2)

## 2020-12-20 MED ORDER — INDOMETHACIN 50 MG PO CAPS: 50.0000 mg | ORAL_CAPSULE | Freq: Two times a day (BID) | ORAL | 0 refills | Status: DC

## 2020-12-20 MED ORDER — OXYCODONE-ACETAMINOPHEN 5-325 MG PO TABS: 1.0000 | ORAL_TABLET | ORAL | 0 refills | Status: DC | PRN

## 2020-12-20 MED ORDER — OMEPRAZOLE 20 MG PO CPDR: 20.0000 mg | DELAYED_RELEASE_CAPSULE | Freq: Every day | ORAL | 0 refills | Status: DC

## 2020-12-20 MED ORDER — AMLODIPINE BESYLATE 5 MG PO TABS: 5.0000 mg | ORAL_TABLET | Freq: Every day | ORAL | 0 refills | Status: DC

## 2020-12-20 MED ORDER — CYCLOBENZAPRINE HCL 10 MG PO TABS: 10.0000 mg | ORAL_TABLET | Freq: Every evening | ORAL | 0 refills | Status: DC | PRN

## 2020-12-20 MED ORDER — MENTHOL 3 MG MT LOZG
1.0000 | LOZENGE | OROMUCOSAL | Status: DC | PRN
  Filled 2020-12-20: qty 9

## 2020-12-20 NOTE — Progress Notes (Signed)
Physical Therapy Treatment Patient Details Name: Clarence Alvarez MRN: [id number] DOB: 1965/08/21 Today's Date: 12/20/2020    History of Present Illness Clarence Alvarez is a 55 y.o. male who presented to [facility] ED on 6/3 s/p mechanical fall at work, found to have a left clavicle fracture. He was discharged home, the following evening he experienced a syncopal episode. Pt returned to the ED 6/4 where he was found to have L 3rd - 10th rib fractures, anterior displaced fracture of sternal end of left clavicle and splenic laceration with hemoperitoneum. 6/7 splenic arteriogram and embolization.  S/p L clavicle ORIF 6/10. Pt with a past medical history significant for OSA, asthma, HTN and DM .    PT Comments    Pt continues to display improved balance with mobility as upon arrival he was ambulating around his room independently. He continues to display some mild balance deficits when he rotates his head L <> R while ambulating, thus educated pt to slowly progress speed with rotation of head as long as pt can maintain his balance. Pt with limited AROM L shoulder flexion and abduction, thus educated pt on AAROM as tolerated. Provided pt with MedBridge HEP handout on performing these exercises along with gentle stretching interventions to his L upper trap and levator scap musculature to reduce risk for limited mobility. Will continue to follow acutely. Follow surgeon's d/c recs for follow-up therapies.    Follow Up Recommendations  Supervision for mobility/OOB;Follow surgeon's recommendation for DC plan and follow-up therapies     Equipment Recommendations  None recommended by PT    Recommendations for Other Services       Precautions / Restrictions Precautions Precautions: Fall Precaution Comments: unrestricted L UE ROM Required Braces or Orthoses: Sling Restrictions Weight Bearing Restrictions: Yes LUE Weight Bearing: Non weight bearing    Mobility  Bed Mobility               General  bed mobility comments: Pt standing in bathroom upon arrival.    Transfers Overall transfer level: Needs assistance Equipment used: None Transfers: Sit to/from Stand Sit to Stand: Independent         General transfer comment: Pt able to return to sit independently.  Ambulation/Gait Ambulation/Gait assistance: Min guard;Supervision Gait Distance (Feet): 400 Feet Assistive device: None Gait Pattern/deviations: WFL(Within Functional Limits) Gait velocity: WNL Gait velocity interpretation: >4.37 ft/sec, indicative of normal walking speed General Gait Details: Pt with improved gait speed and step length, WFL. However, continues to display lateral LOB when quickly rotating head either direction, min guard to recover. Cued pt to slow head rotations and gradually increase speed as able to maintain balance to improve his mobility safety, success.   Stairs             Wheelchair Mobility    Modified Rankin (Stroke Patients Only) Modified Rankin (Stroke Patients Only) Pre-Morbid Rankin Score: No symptoms Modified Rankin: Moderate disability     Balance Overall balance assessment: Mild deficits observed, not formally tested                                          Cognition Arousal/Alertness: Awake/alert Behavior During Therapy: WFL for tasks assessed/performed Overall Cognitive Status: Within Functional Limits for tasks assessed  General Comments: Aware of his deficits and safety.      Exercises General Exercises - Upper Extremity Shoulder Flexion: Left;Other reps (comment);Seated;AAROM (x3) Shoulder ABduction: AAROM;Left;Other reps (comment);Seated (x2) Elbow Flexion: AROM;Left;Other reps (comment);Seated (x3) Elbow Extension: AROM;Left;Other reps (comment);Seated (x3) Wrist Flexion: AROM;Left;5 reps;Seated Wrist Extension: AROM;Left;5 reps;Seated Other Exercises Other Exercises: Gentle stretch to L  upper trap and levator scap 1x each    General Comments General comments (skin integrity, edema, etc.): Provided MedBridge HEP handout and educated pt on following exercises: supine AAROM shoulder flexion and abduction and stretches to upper trap and levator scap      Pertinent Vitals/Pain Pain Assessment: Faces Faces Pain Scale: Hurts little more Pain Location: L collar bone, L ribs Pain Descriptors / Indicators: Discomfort;Grimacing;Guarding Pain Intervention(s): Limited activity within patient's tolerance;Monitored during session;Repositioned    Home Living                      Prior Function            PT Goals (current goals can now be found in the care plan section) Acute Rehab PT Goals Patient Stated Goal: to improve PT Goal Formulation: With patient Time For Goal Achievement: 12/31/20 Potential to Achieve Goals: Good Progress towards PT goals: Progressing toward goals    Frequency    Min 3X/week      PT Plan Current plan remains appropriate    Co-evaluation              AM-PAC PT "6 Clicks" Mobility   Outcome Measure  Help needed turning from your back to your side while in a flat bed without using bedrails?: None Help needed moving from lying on your back to sitting on the side of a flat bed without using bedrails?: None Help needed moving to and from a bed to a chair (including a wheelchair)?: A Little Help needed standing up from a chair using your arms (e.g., wheelchair or bedside chair)?: A Little Help needed to walk in hospital room?: A Little Help needed climbing 3-5 steps with a railing? : A Little 6 Click Score: 20    End of Session Equipment Utilized During Treatment: Other (comment) (sling) Activity Tolerance: Patient tolerated treatment well Patient left: with call bell/phone within reach;with family/visitor present;in chair Nurse Communication: Mobility status PT Visit Diagnosis: Unsteadiness on feet (R26.81);Other  abnormalities of gait and mobility (R26.89);History of falling (Z91.81);Difficulty in walking, not elsewhere classified (R26.2);Pain Pain - Right/Left: Left Pain - part of body:  (clavicle)     Time: 3532-9924 PT Time Calculation (min) (ACUTE ONLY): 25 min  Charges:  $Gait Training: 8-22 mins $Therapeutic Exercise: 8-22 mins                     Moishe Spice, PT, DPT Acute Rehabilitation Services  Pager: [phone] Office: [place] 12/20/2020, 11:19 AM

## 2020-12-20 NOTE — Progress Notes (Signed)
Orthopaedic Trauma Service Progress Note  Patient ID: LADISLAUS REPSHER MRN: [id number] DOB/AGE: 10/03/1965 55 y.o.  Subjective:  No specific complaints Doing well overall   Review of Systems  All other systems reviewed and are negative.  Objective:   VITALS:   Vitals:   12/19/20 2319 12/20/20 0337 12/20/20 0700 12/20/20 1100  BP: (!) 155/92 (!) 148/89 (!) 159/80 (!) 153/96  Pulse: 98 87  86  Resp: 20 17  20   Temp: 98.5 F (36.9 C) 97.8 F (36.6 C) 98.1 F (36.7 C) 97.9 F (36.6 C)  TempSrc: Oral Oral Oral Oral  SpO2: 93% 95%    Weight:      Height:        Estimated body mass index is 35.14 kg/m as calculated from the following:   Height as of this encounter: 5' 11.5" (1.816 m).   Weight as of this encounter: 115.9 kg.   Intake/Output      06/10 0701 06/11 0700 06/11 0701 06/12 0700   P.O.     I.V. (mL/kg) 1180.7 (10.2)    IV Piggyback 198.1    Total Intake(mL/kg) 1378.8 (11.9)    Urine (mL/kg/hr) 900 (0.3)    Emesis/NG output     Stool 0    Blood 50    Total Output 950    Net +428.8         Urine Occurrence 1 x    Stool Occurrence 1 x      LABS  Results for orders placed or performed during the hospital encounter of 12/13/20 (from the past 24 hour(s))  CBC     Status: Abnormal   Collection Time: 12/20/20  3:27 AM  Result Value Ref Range   WBC 8.7 4.0 - 10.5 K/uL   RBC 2.97 (L) 4.22 - 5.81 MIL/uL   Hemoglobin 8.9 (L) 13.0 - 17.0 g/dL   HCT 27.4 (L) 39.0 - 52.0 %   MCV 92.3 80.0 - 100.0 fL   MCH 30.0 26.0 - 34.0 pg   MCHC 32.5 30.0 - 36.0 g/dL   RDW 14.2 11.5 - 15.5 %   Platelets 229 150 - 400 K/uL   nRBC 0.5 (H) 0.0 - 0.2 %  Basic metabolic panel     Status: Abnormal   Collection Time: 12/20/20  3:27 AM  Result Value Ref Range   Sodium 134 (L) 135 - 145 mmol/L   Potassium 4.4 3.5 - 5.1 mmol/L   Chloride 99 98 - 111 mmol/L   CO2 26 22 - 32 mmol/L   Glucose, Bld 133  (H) 70 - 99 mg/dL   BUN 13 6 - 20 mg/dL   Creatinine, Ser 0.86 0.61 - 1.24 mg/dL   Calcium 8.4 (L) 8.9 - 10.3 mg/dL   GFR, Estimated >60 >60 mL/min   Anion gap 9 5 - 15     PHYSICAL EXAM:   Gen: sitting up in chair, NAD, appears well, very pleasant  Ext:       Left upper Extremity   Dressing c/d/I  Ext warm   + radial pulse  Motor and sensory functions intact  Good elbow, forearm, wrist and hand motion   Minimal swelling  Had pt take tungsten wedding ring off in event he swells    Assessment/Plan: 1 Day Post-Op   Active Problems:  Spleen laceration   Clavicle fracture   Anti-infectives (From admission, onward)    Start     Dose/Rate Route Frequency Ordered Stop   12/19/20 1800  ceFAZolin (ANCEF) IVPB 2g/100 mL premix        2 g 200 mL/hr over 30 Minutes Intravenous Every 8 hours 12/19/20 1309 12/20/20 2159   12/19/20 1122  vancomycin (VANCOCIN) powder  Status:  Discontinued          As needed 12/19/20 1123 12/19/20 1204   12/19/20 1030  ceFAZolin (ANCEF) IVPB 2g/100 mL premix        2 g 200 mL/hr over 30 Minutes Intravenous To [facility] 12/19/20 0717 12/19/20 1040   12/19/20 0730  ceFAZolin (ANCEF) IVPB 2g/100 mL premix  Status:  Discontinued        2 g 200 mL/hr over 30 Minutes Intravenous To ShortStay Surgical 12/19/20 0714 12/19/20 0717     .  POD/HD#: 1  55 y/o RHD male with L clavicle fracture   -L clavicle fracture s/p ORIF   No lifting with L arm   Sling for comfort, can be off when in chair or bed  No ROM restrictions L shoulder, elbow, forearm, wrist or hand   Ice and elevate for swelling contol  Daily dressing changes starting tomorrow   Mepilex or 4x4 gauze and tape    Follow up with ortho in 10 days    - Dispo:  Stable from ortho standpoint     Jari Pigg, PA-C [phone] (C) 12/20/2020, 12:11 PM  [facility] [place] [address] [phone] Jenetta [phone] (F)    After  5pm and on the weekends please log on to Amion, go to orthopaedics and the look under the [facility] Call for the provider(s) on call. You can also call our office at [phone] and then follow the prompts to be connected to the call team.

## 2020-12-20 NOTE — Discharge Summary (Signed)
[facility] Discharge Summary   Patient ID: Clarence Alvarez MRN: [id number] DOB/AGE: 12-Feb-1966 55 y.o.  Admit date: 12/13/2020 Discharge date: 12/20/2020  Discharge Diagnosis Fall  Spleen laceration Left clavicle fracture AKI ABL anemia   Consultants Orthopedics Interventional radiology  Imaging: DG Clavicle Left  Result Date: 12/19/2020 CLINICAL DATA:  Left clavicle fracture fixation. EXAM: LEFT CLAVICLE - 2+ VIEWS COMPARISON:  Intraoperative spot films. FINDINGS: There is a long superior cortical plate and compression screws fixating the left mid clavicle fracture. Anatomic reduction without complicating features. IMPRESSION: Anatomic reduction and internal fixation of left clavicle fracture. Electronically Signed   By: Marijo Sanes M.D.   On: 12/19/2020 14:45   DG Clavicle Left  Result Date: 12/19/2020 CLINICAL DATA:  Open reduction and internal fixation of left clavicular fracture. EXAM: LEFT CLAVICLE - 2+ VIEWS; DG C-ARM 1-60 MIN Radiation exposure index: 4.88 mGy. COMPARISON:  December 12, 2020. FINDINGS: Seven intraoperative fluoroscopic images were obtained of the left shoulder. These images demonstrate surgical internal fixation of comminuted distal left clavicular fracture. Good alignment of fracture components is noted. IMPRESSION: Fluoroscopic guidance provided during open reduction and internal fixation of left clavicular fracture. Electronically Signed   By: Marijo Conception M.D.   On: 12/19/2020 13:42   DG C-Arm 1-60 Min  Result Date: 12/19/2020 CLINICAL DATA:  Open reduction and internal fixation of left clavicular fracture. EXAM: LEFT CLAVICLE - 2+ VIEWS; DG C-ARM 1-60 MIN Radiation exposure index: 4.88 mGy. COMPARISON:  December 12, 2020. FINDINGS: Seven intraoperative fluoroscopic images were obtained of the left shoulder. These images demonstrate surgical internal fixation of comminuted distal left clavicular fracture. Good alignment of fracture components is  noted. IMPRESSION: Fluoroscopic guidance provided during open reduction and internal fixation of left clavicular fracture. Electronically Signed   By: Marijo Conception M.D.   On: 12/19/2020 13:42    Procedures #1. Dr. Doreatha Martin (12/19/2020) - Open reduction internal fixation of left clavicle #2. Dr. Vernard Gambles (12/16/20) - Splenic arteriogram and embolization flow to lower pole preserved  Hospital Course:  Clarence Alvarez is a 55yo male who presented to the emergency department 12/13/20 after fall at work the day prior and landing on his left side.  Evaluated in the ED with xrays and found to have a clavicle fracture and was discharged home. He fell again the morning of 12/13/20 . Workup showed a splenic laceration.  Patient was admitted to the trauma service and placed on bed rest with serial hemoglobin checks. He continued to have a drop in hemoglobin requiring blood transfusion therefore interventional radiology was consulted and took the patient for splenic arteriogram and embolization flow to lower pole preserved. Tolerated procedure well and hemoglobin remained stable post-procedure. He was mobilized and diet advanced as tolerated.  Patient worked with therapies during this admission. On 6/11 the patient was voiding well, tolerating diet, ambulating well, pain well controlled, vital signs stable and felt stable for discharge home.  Patient will follow up as below and knows to call with questions or concerns.     I was not directly involved in this patient's care therefore the information in this discharge summary was taken from the chart.   Allergies as of 12/20/2020       Reactions   Lactose Intolerance (gi) Other (See Comments)   Gas and bloating   Latex Other (See Comments)   Skin irritation dsicoloration/blisters        Medication List     STOP taking these medications  Flovent HFA 110 MCG/ACT inhaler Generic drug: fluticasone   fluticasone 50 MCG/ACT nasal spray Commonly known as:  FLONASE   sildenafil 100 MG tablet Commonly known as: Viagra       TAKE these medications    albuterol (2.5 MG/3ML) 0.083% nebulizer solution Commonly known as: PROVENTIL Take 3 mLs (2.5 mg total) by nebulization every 6 (six) hours as needed for wheezing or shortness of breath. What changed: Another medication with the same name was removed. Continue taking this medication, and follow the directions you see here.   amLODipine 5 MG tablet Commonly known as: NORVASC Take 1 tablet (5 mg total) by mouth daily.   cetirizine 10 MG tablet Commonly known as: ZYRTEC Take 10 mg by mouth every morning.   cyclobenzaprine 10 MG tablet Commonly known as: FLEXERIL Take 1 tablet (10 mg total) by mouth at bedtime as needed for muscle spasms.   indomethacin 50 MG capsule Commonly known as: INDOCIN Take 1 capsule (50 mg total) by mouth 2 (two) times daily with a meal.   ketorolac 60 MG/2ML Soln injection Commonly known as: TORADOL Inject 60 mg into the muscle once.   methocarbamol 750 MG tablet Commonly known as: ROBAXIN Take 750 mg by mouth in the morning and at bedtime.   omeprazole 20 MG capsule Commonly known as: PRILOSEC Take 1 capsule (20 mg total) by mouth daily.   OVER THE COUNTER MEDICATION Take 1 tablet by mouth daily.   oxyCODONE 5 MG immediate release tablet Commonly known as: Roxicodone Take 1 tablet (5 mg total) by mouth every 6 (six) hours as needed for moderate pain or severe pain.   oxyCODONE-acetaminophen 5-325 MG tablet Commonly known as: Percocet Take 1 tablet by mouth every 4 (four) hours as needed for severe pain.          Follow-up Information     [name], Thomasene Lot, MD. Go on 12/30/2020.   Specialty: Orthopedic Surgery Why: 12/30/20 at 9:00AM for repeat x-rays and wound check Contact information: [place] [address] [phone]         Owens Loffler, MD Follow up.   Specialties: Family Medicine, Sports Medicine Why:  Call to arrange post-hospitalization follow up appointment Contact information: [place] [address] [phone]         [place]. Go on 01/15/2021.   Why: Your appointment is 7/7 at 9am Please arrive 30 minutes prior to your appointment to check in and fill out paperwork. Bring photo ID and insurance information. Contact information: [place] [address] [phone]                Signed: Wellington Hampshire, [facility] 12/20/2020, 11:29 AM Please see Amion for pager number during day hours 7:00am-4:30pm

## 2020-12-22 ENCOUNTER — Telehealth: Payer: Self-pay | Admitting: Family Medicine

## 2020-12-22 NOTE — Telephone Encounter (Signed)
Spoke with pt and daughter about FMLA paperwork and process.  Beginning to fill out forms

## 2020-12-22 NOTE — Telephone Encounter (Signed)
Received FMLA paperwork. In Clarence Alvarez's box. EM

## 2020-12-23 ENCOUNTER — Telehealth: Payer: Self-pay

## 2020-12-23 NOTE — Telephone Encounter (Signed)
Semi completed FMLA paperwork for patients daughter placed in PCP's inbox for review, completion, sign and date

## 2020-12-23 NOTE — Telephone Encounter (Signed)
[facility] Night - Client Nonclinical Telephone Record  [facility] Night - Client Client Site [place] Physician Owens Loffler - MD Contact Type Call Who Is Calling Patient / Member / Family / Caregiver Caller Name Aladdin Kollmann Caller Phone Number [phone] Patient Name Clarence Alvarez Patient DOB 04-18-1966 Call Type Message Only Information Provided Reason for Call Request to Schedule Office Appointment Initial Comment Caller states he is calling to schedule an appointment. Patient request to speak to RN No Additional Comment Caller states he will call back during office hours and can also be reached at [phone]. Disp. Time Disposition Final User 12/23/2020 7:32:37 AM General Information Provided Yes Jamal Maes Call Closed By: Jamal Maes Transaction Date/Time: 12/23/2020 7:28:35 AM (ET)

## 2020-12-23 NOTE — Telephone Encounter (Signed)
Pt has apt with PCP tomorrow.

## 2020-12-24 ENCOUNTER — Ambulatory Visit (INDEPENDENT_AMBULATORY_CARE_PROVIDER_SITE_OTHER): Payer: Commercial Managed Care - PPO | Admitting: Family Medicine

## 2020-12-24 ENCOUNTER — Encounter (HOSPITAL_COMMUNITY): Payer: Self-pay | Admitting: Student

## 2020-12-24 ENCOUNTER — Other Ambulatory Visit: Payer: Self-pay

## 2020-12-24 VITALS — BP 120/64 | HR 100 | Temp 98.0°F | Ht 71.5 in | Wt 247.0 lb

## 2020-12-24 DIAGNOSIS — R200 Anesthesia of skin: Secondary | ICD-10-CM

## 2020-12-24 DIAGNOSIS — S42023D Displaced fracture of shaft of unspecified clavicle, subsequent encounter for fracture with routine healing: Secondary | ICD-10-CM

## 2020-12-24 DIAGNOSIS — H9319 Tinnitus, unspecified ear: Secondary | ICD-10-CM

## 2020-12-24 DIAGNOSIS — N179 Acute kidney failure, unspecified: Secondary | ICD-10-CM | POA: Diagnosis not present

## 2020-12-24 DIAGNOSIS — D500 Iron deficiency anemia secondary to blood loss (chronic): Secondary | ICD-10-CM | POA: Diagnosis not present

## 2020-12-24 DIAGNOSIS — J301 Allergic rhinitis due to pollen: Secondary | ICD-10-CM

## 2020-12-24 DIAGNOSIS — S2249XA Multiple fractures of ribs, unspecified side, initial encounter for closed fracture: Secondary | ICD-10-CM

## 2020-12-24 DIAGNOSIS — S36039D Unspecified laceration of spleen, subsequent encounter: Secondary | ICD-10-CM

## 2020-12-24 DIAGNOSIS — R202 Paresthesia of skin: Secondary | ICD-10-CM

## 2020-12-24 MED ORDER — OXYCODONE-ACETAMINOPHEN 5-325 MG PO TABS: 1.0000 | ORAL_TABLET | ORAL | 0 refills | Status: DC | PRN

## 2020-12-24 MED ORDER — CYCLOBENZAPRINE HCL 10 MG PO TABS: 5.0000 mg | ORAL_TABLET | Freq: Three times a day (TID) | ORAL | 1 refills | Status: DC | PRN

## 2020-12-24 MED ORDER — IPRATROPIUM BROMIDE 0.06 % NA SOLN: 2.0000 | Freq: Four times a day (QID) | NASAL | 5 refills | Status: DC

## 2020-12-24 NOTE — Progress Notes (Signed)
Clarence Gallardo T. Cash Duce, MD, [place] at [facility] [place], [address]  Phone: [phone]  FAX: [phone]  Clarence Alvarez - 55 y.o. male  MRN [id number]  Date of Birth: 05/24/1966  Date: 12/24/2020  PCP: Owens Loffler, MD  Referral: Owens Loffler, MD  Chief Complaint  Patient presents with   Hospitalization Follow-up    This visit occurred during the SARS-CoV-2 public health emergency.  Safety protocols were in place, including screening questions prior to the visit, additional usage of staff PPE, and extensive cleaning of exam room while observing appropriate contact time as indicated for disinfecting solutions.   Subjective:   Clarence Alvarez is a 55 y.o. very pleasant male patient with Body mass index is 33.97 kg/m. who presents with the following:  TCM 7:  This is a complicated hospital follow-up, but to sum up: He was at work carrying an arm load of things, and when he pushed a board out of his way with his foot he fell landing on his left shoulder.  He was initially evaluated on December 12, 2020 in the emergency room.  He was found to have a displaced clavicle fracture and follow-up with orthopedic surgery for management recommended.  He went to [facility] after his ED visit and surgery was planned for next week.  While at home, the patient felt dizzy when he stood up had a syncopal episode, and EMS was called where he had an additional syncopal episode in route.  The patient noted an EMS that he had some lateral wall chest pain that was significant as well as abdominal pain and distention.  In the ER, he was tachycardic, and a CT a of the chest abdomen and pelvis was found to have a high-grade splenic injury. Trauma/general surgery admitted the patient to the ICU, and in addition to clavicle fracture, the patient had 8 rib fractures and hemoperitoneum.  He required 2 units of PRBC's due to hemoglobin  drop to 6.9.  IR was consulted by Trauma Surgery, and IR did a splenic arteriogram and embolization.  Dr. Doreatha Martin did an ORIF of the L clavicle on 12/19/2020.  Admit date: 12/13/2020 Discharge date: 12/20/2020  ORIF for L Clavicle fracture.  By Dr, Doreatha Martin. He will follow-up with his office.   Rib fractures on CT, 12/13/2020:  3rd, 4th, 5th, 6th, 7th, 8th, 9th, and 10th.  He also had AKI with a Cr to 2.42, but normal at time at discharge.  ABL anemia - hgb 8 at d/c  Neck has been hurting now, too. He is also having some numbness and tingling on the L UE side - same as clavicle fracture.   Ringing in ears.   Ankles will swell now with sitting.   Sinuses:  He feels like he is having some pressure.  Intranasal and inhaled steroids d/c at time of discharge.  Review of Systems is noted in the HPI, as appropriate  Patient Active Problem List   Diagnosis Date Noted   Clavicle fracture 12/20/2020   Spleen laceration 12/13/2020   COVID-19 07/18/2020   Diabetes mellitus type 2, diet-controlled ([place]) 05/16/2019   Traumatic complete tear of right rotator cuff 11/30/2018   Essential hypertension 03/20/2018   Degeneration of intervertebral disc of cervical spine without disc herniation 02/29/2016   OSA (obstructive sleep apnea) 01/26/2013   Moderate persistent asthma with allergic rhinitis without complication 26/83/4196    Past Medical History:  Diagnosis Date  Allergic rhinitis, seasonal    Anterior displaced fracture of sternal end of left clavicle, initial encounter for closed fracture 12/12/2020   GERD (gastroesophageal reflux disease)    History of adenomatous polyp of colon    03-25-2014  tubular adenoma's /  hyperplastic   History of hiatal hernia    History of kidney stones October 26, 2016   passed on own   Hypertension    Mild obstructive sleep apnea    per study 2012/10/26, pt states he does not have sleep apnea   Moderate persistent asthma    followed by pcp-- uncomplicated   MRSA  infection 10-26-2008   buttocks    Past Surgical History:  Procedure Laterality Date   29 HOUR [place] STUDY N/A 05/04/2018   Procedure: [place] STUDY;  Surgeon: Jonathon Bellows, MD;  Location: [facility] ENDOSCOPY;  Service: Gastroenterology;  Laterality: N/A;   anterior cervucal  10/27/2015   with fusion   COLONOSCOPY  03/25/2014 , 10/26/2017   ESOPHAGEAL MANOMETRY N/A 05/04/2018   Procedure: ESOPHAGEAL MANOMETRY (EM);  Surgeon: Jonathon Bellows, MD;  Location: [facility] ENDOSCOPY;  Service: Gastroenterology;  Laterality: N/A;   ESOPHAGOGASTRODUODENOSCOPY (EGD) WITH PROPOFOL N/A 02/24/2018   Procedure: ESOPHAGOGASTRODUODENOSCOPY (EGD) WITH PROPOFOL;  Surgeon: Jonathon Bellows, MD;  Location: [facility] ENDOSCOPY;  Service: Gastroenterology;  Laterality: N/A;   IR ANGIOGRAM FOLLOW UP STUDY  12/16/2020   IR ANGIOGRAM SELECTIVE EACH ADDITIONAL VESSEL  12/16/2020   IR ANGIOGRAM VISCERAL SELECTIVE  12/16/2020   IR EMBO ART  VEN HEMORR LYMPH EXTRAV  INC GUIDE ROADMAPPING  12/16/2020   IR US GUIDE VASC ACCESS RIGHT  12/16/2020   KNEE SURGERY Right 10/26/13   ORIF CLAVICULAR FRACTURE Left 12/19/2020   Procedure: OPEN REDUCTION INTERNAL FIXATION (ORIF) CLAVICULAR FRACTURE;  Surgeon: Shona Needles, MD;  Location: [facility];  Service: Orthopedics;  Laterality: Left;   RESECTION DISTAL CLAVICAL Right 12/06/2018   Procedure: RESECTION DISTAL CLAVICAL AND DEBRIDEMENT OF THE SHOULDER;  Surgeon: Elsie Saas, MD;  Location: [facility];  Service: Orthopedics;  Laterality: Right;  SCALENE BLOCK   SHOULDER ARTHROSCOPY WITH ROTATOR CUFF REPAIR AND SUBACROMIAL DECOMPRESSION Left 07-24-2010  dr Noemi Chapel @  [facility]   and Labrum tear debridement   SHOULDER ARTHROSCOPY WITH ROTATOR CUFF REPAIR AND SUBACROMIAL DECOMPRESSION Right 12/06/2018   Procedure: SHOULDER ARTHROSCOPY WITH ROTATOR CUFF REPAIR AND SUBACROMIAL DECOMPRESSION;  Surgeon: Elsie Saas, MD;  Location: [facility];  Service: Orthopedics;  Laterality: Right;  SCALENE BLOCK     Family History  Problem Relation Age of Onset   Diabetes Maternal Grandmother    Colon cancer Maternal Grandmother        dx age 106 - recurance at age 39   Diabetes Mother    Hepatitis C Mother    Prostate cancer Father    Colon cancer Father        dx - 68's   Diabetes Paternal Grandmother    Mental illness Brother    Mental illness Sister    Mental illness Daughter    Stomach cancer Maternal Grandfather    COPD Other        Aunt   Diabetes Other        siblings   Pancreatic cancer Paternal Aunt    Esophageal cancer Neg Hx    Rectal cancer Neg Hx      Objective:   BP 120/64   Pulse 100   Temp 54 F (36.7 C) (Temporal)   Ht 5' 11.5" (1.816 m)  Wt 247 lb (112 kg)   SpO2 96%   BMI 33.97 kg/m   GEN: No acute distress; alert,appropriate.  He appears somewhat uncomfortable. PULM: Pain with taking a deep breath.  Somewhat uncomfortable breathing. CTAB CV: RRR, no m/g/r  PSYCH: Normally interactive.   Laboratory and Imaging Data: Lab data and imaging data reviewed in [facility].  Assessment and Plan:     ICD-10-CM   1. Laceration of spleen, subsequent encounter  S36.039D     2. Blood loss anemia  D50.0 CBC with Differential/Platelet    3. AKI (acute kidney injury) ([place])  W58.0 Basic metabolic panel    Hepatic function panel    4. Tinnitus, unspecified laterality  H93.19     5. Multiple rib fractures involving four or more ribs  S22.49XA     6. Closed displaced fracture of shaft of clavicle with routine healing, subsequent encounter  S42.023D     7. Seasonal allergic rhinitis due to pollen  J30.1     8. Numbness and tingling in left arm  R20.0    D98.3      Very complicated medical case with posthospitalization evaluation.  Splenic laceration with acute blood loss anemia down to just below 7 requiring 2 units of packed red blood cells.  Status post splenic artery embolization from interventional radiology.  Trauma surgery really was the  managing service throughout.  He does have follow-up with them at time of discharge.  Renal function has returned to normal with a prior creatinine peak of 2.4.  Multiple rib fractures, patient does have 8 rib fractures and he is in some significant pain with movement and breathing.  Displaced clavicle fracture status post ORIF by Dr. Doreatha Martin.  He also has follow-up arranged with Dr. Doreatha Martin. Physical therapy did work with him in the hospital, he is already doing this basically  He has had prior shoulder surgery, so he is familiar, but orthopedic trauma will be managing his case.  He also mentions to me that he does have some tingling in his left upper extremity right now.  He also describes tinnitus after this injury.  Allergic rhinitis and asthma, he feels like these are cysts relatively stable with some stuffiness.  Inhaled and intranasal steroids were held at the time of his discharge, and I think that they can be started in short order.  This would be okay by his next office visit.  Atrovent for now.  Meds ordered this encounter  Medications   ipratropium (ATROVENT) 0.06 % nasal spray    Sig: Place 2 sprays into both nostrils 4 (four) times daily.    Dispense:  15 mL    Refill:  5   cyclobenzaprine (FLEXERIL) 10 MG tablet    Sig: Take 0.5-1 tablets (5-10 mg total) by mouth 3 (three) times daily as needed for muscle spasms.    Dispense:  50 tablet    Refill:  1   oxyCODONE-acetaminophen (PERCOCET) 5-325 MG tablet    Sig: Take 1 tablet by mouth every 4 (four) hours as needed for severe pain.    Dispense:  30 tablet    Refill:  0   Medications Discontinued During This Encounter  Medication Reason   ketorolac (TORADOL) 60 MG/2ML SOLN injection Completed Course   indomethacin (INDOCIN) 50 MG capsule    methocarbamol (ROBAXIN) 750 MG tablet    cyclobenzaprine (FLEXERIL) 10 MG tablet Reorder   oxyCODONE (ROXICODONE) 5 MG immediate release tablet    oxyCODONE-acetaminophen (PERCOCET)  5-325  MG tablet Reorder   Orders Placed This Encounter  Procedures   Basic metabolic panel   CBC with Differential/Platelet   Hepatic function panel    Follow-up: Return in about 1 month (around 01/23/2021) for trauma follow-up.  Signed,  Maud Deed. Leauna Sharber, MD   Outpatient Encounter Medications as of 12/24/2020  Medication Sig   albuterol (PROVENTIL) (2.5 MG/3ML) 0.083% nebulizer solution Take 3 mLs (2.5 mg total) by nebulization every 6 (six) hours as needed for wheezing or shortness of breath.   amLODipine (NORVASC) 5 MG tablet Take 1 tablet (5 mg total) by mouth daily.   cetirizine (ZYRTEC) 10 MG tablet Take 10 mg by mouth every morning.    ipratropium (ATROVENT) 0.06 % nasal spray Place 2 sprays into both nostrils 4 (four) times daily.   omeprazole (PRILOSEC) 20 MG capsule Take 1 capsule (20 mg total) by mouth daily.   OVER THE COUNTER MEDICATION Take 1 tablet by mouth daily.   [DISCONTINUED] cyclobenzaprine (FLEXERIL) 10 MG tablet Take 1 tablet (10 mg total) by mouth at bedtime as needed for muscle spasms.   [DISCONTINUED] indomethacin (INDOCIN) 50 MG capsule Take 1 capsule (50 mg total) by mouth 2 (two) times daily with a meal.   [DISCONTINUED] ketorolac (TORADOL) 60 MG/2ML SOLN injection Inject 60 mg into the muscle once.   [DISCONTINUED] methocarbamol (ROBAXIN) 750 MG tablet Take 750 mg by mouth in the morning and at bedtime.   [DISCONTINUED] oxyCODONE (ROXICODONE) 5 MG immediate release tablet Take 1 tablet (5 mg total) by mouth every 6 (six) hours as needed for moderate pain or severe pain.   [DISCONTINUED] oxyCODONE-acetaminophen (PERCOCET) 5-325 MG tablet Take 1 tablet by mouth every 4 (four) hours as needed for severe pain.   cyclobenzaprine (FLEXERIL) 10 MG tablet Take 0.5-1 tablets (5-10 mg total) by mouth 3 (three) times daily as needed for muscle spasms.   oxyCODONE-acetaminophen (PERCOCET) 5-325 MG tablet Take 1 tablet by mouth every 4 (four) hours as needed for severe  pain.   [DISCONTINUED] albuterol (PROVENTIL,VENTOLIN) 90 MCG/ACT inhaler Inhale 2 puffs into the lungs every 6 (six) hours as needed for wheezing.   No facility-administered encounter medications on file as of 12/24/2020.

## 2020-12-24 NOTE — OR Nursing (Signed)
Late entry:  Implant documentation corrected to reflect only one plate was implanted for the left clavicle. Verified with x-ray and OP notes.

## 2020-12-25 LAB — BASIC METABOLIC PANEL
BUN: 16 mg/dL (ref 6–23)
CO2: 26 mEq/L (ref 19–32)
Calcium: 9.4 mg/dL (ref 8.4–10.5)
Chloride: 101 mEq/L (ref 96–112)
Creatinine, Ser: 1.02 mg/dL (ref 0.40–1.50)
GFR: 82.16 mL/min (ref >60.00)
Glucose, Bld: 110 mg/dL — ABNORMAL HIGH (ref 70–99)
Potassium: 4.7 mEq/L (ref 3.5–5.1)
Sodium: 137 mEq/L (ref 135–145)

## 2020-12-25 LAB — CBC WITH DIFFERENTIAL/PLATELET
Basophils Absolute: 0.1 10*3/uL (ref 0.0–0.1)
Basophils Relative: 0.7 % (ref 0.0–3.0)
Eosinophils Absolute: 0.3 10*3/uL (ref 0.0–0.7)
Eosinophils Relative: 3.6 % (ref 0.0–5.0)
HCT: 32.1 % — ABNORMAL LOW (ref 39.0–52.0)
Hemoglobin: 10.8 g/dL — ABNORMAL LOW (ref 13.0–17.0)
Lymphocytes Relative: 23.8 % (ref 12.0–46.0)
Lymphs Abs: 1.8 10*3/uL (ref 0.7–4.0)
MCHC: 33.5 g/dL (ref 30.0–36.0)
MCV: 90.5 fl (ref 78.0–100.0)
Monocytes Absolute: 0.8 10*3/uL (ref 0.1–1.0)
Monocytes Relative: 10.4 % (ref 3.0–12.0)
Neutro Abs: 4.6 10*3/uL (ref 1.4–7.7)
Neutrophils Relative %: 61.5 % (ref 43.0–77.0)
Platelets: 386 10*3/uL (ref 150.0–400.0)
RBC: 3.54 Mil/uL — ABNORMAL LOW (ref 4.22–5.81)
RDW: 15 % (ref 11.5–15.5)
WBC: 7.5 10*3/uL (ref 4.0–10.5)

## 2020-12-25 LAB — HEPATIC FUNCTION PANEL
ALT: 39 U/L (ref 0–53)
AST: 39 U/L — ABNORMAL HIGH (ref 0–37)
Albumin: 4.2 g/dL (ref 3.5–5.2)
Alkaline Phosphatase: 109 U/L (ref 39–117)
Bilirubin, Direct: 0.2 mg/dL (ref 0.0–0.3)
Total Bilirubin: 1.1 mg/dL (ref 0.2–1.2)
Total Protein: 7.5 g/dL (ref 6.0–8.3)

## 2020-12-25 NOTE — Telephone Encounter (Signed)
Completed and given to Mrs. Clarence Alvarez.

## 2020-12-26 NOTE — Telephone Encounter (Signed)
LVM to inform daughter that paperwork was completed and faxed  Copy mailed to daughter   Copy for scan   Copy retained by me

## 2020-12-27 ENCOUNTER — Encounter: Payer: Self-pay | Admitting: Family Medicine

## 2021-01-21 NOTE — Progress Notes (Signed)
Clarence Alvarez T. Prim Morace, MD, [place] at [facility] [place], [address]  Phone: [phone]  FAX: [phone]  Clarence Alvarez - 55 y.o. male  MRN [id number]  Date of Birth: [birth year]  Date: 01/22/2021  PCP: Owens Loffler, MD  Referral: Owens Loffler, MD  Chief Complaint  Patient presents with   Follow-up    Trauma    This visit occurred during the SARS-CoV-2 public health emergency.  Safety protocols were in place, including screening questions prior to the visit, additional usage of staff PPE, and extensive cleaning of exam room while observing appropriate contact time as indicated for disinfecting solutions.   Subjective:   Clarence Alvarez is a 55 y.o. very pleasant male patient with Body mass index is 32.73 kg/m. who presents with the following:  F/u after complicated trauma and follow-up after multiple rib fractures, splenic lac, clavicle fixation, Hgb drop to below 7.  He has since seen General Surgery. 01/15/2021 f/u, notes reviewed. Dr. Doreatha Martin is managing clavicle fracture.  Ribs are still problematic, and this is the point of limiting pain at this point.  Discussed with Dr. Doreatha Martin just very recently.  Shoulder is improving.  He is really having a lot of pain with his ribs.  He is trying to walk, and he does have pain when he is walking and taking a deep breath at times. Will go and rest.  Breathing is fine.  He feels like his asthma is stable.  Went to see Gen surgery.  Feels some fluttering at that site.   Had some questions about his spleen.  General surgery did give him some information about interventional radiology who did his embolization procedure, and if symptoms persist they recommended that he see them  Will have his hands get cold while he is eating.  Sometimes he will get hot or cold.   Will have an upcomming an appointment with Dr. Lynann Bologna. Concerns about his neck.  And he also  has had some left-sided numbness. Does have a history of anterior cervical fusion in the past.  Walking pretty fast to try to get in better shape.   PT is upcoming. For shoulder fracture.  [name] will follow rib fractures  Check all labs    12/24/2020 Last OV with Owens Loffler, MD  This is a complicated hospital follow-up, but to sum up: He was at work carrying an arm load of things, and when he pushed a board out of his way with his foot he fell landing on his left shoulder.  He was initially evaluated on December 12, 2020 in the emergency room.  He was found to have a displaced clavicle fracture and follow-up with orthopedic surgery for management recommended.  He went to [facility] after his ED visit and surgery was planned for next week.   While at home, the patient felt dizzy when he stood up had a syncopal episode, and EMS was called where he had an additional syncopal episode in route.  The patient noted an EMS that he had some lateral wall chest pain that was significant as well as abdominal pain and distention.   In the ER, he was tachycardic, and a CT a of the chest abdomen and pelvis was found to have a high-grade splenic injury. Trauma/general surgery admitted the patient to the ICU, and in addition to clavicle fracture, the patient had 8 rib fractures and hemoperitoneum.  He required 2 units of PRBC's due  to hemoglobin drop to 6.9.   IR was consulted by Trauma Surgery, and IR did a splenic arteriogram and embolization.   Dr. Doreatha Martin did an ORIF of the L clavicle on 12/19/2020.   Admit date: 12/13/2020 Discharge date: 12/20/2020   ORIF for L Clavicle fracture.  By Dr, Doreatha Martin. He will follow-up with his office.   Rib fractures on CT, 12/13/2020:  3rd, 4th, 5th, 6th, 7th, 8th, 9th, and 10th.   He also had AKI with a Cr to 2.42, but normal at time at discharge.   ABL anemia - hgb 8 at d/c   Neck has been hurting now, too. He is also having some numbness and tingling on the L  UE side - same as clavicle fracture.   Ringing in ears.   Ankles will swell now with sitting.   Sinuses:  He feels like he is having some pressure.  Intranasal and inhaled steroids d/c at time of discharge.  Review of Systems is noted in the HPI, as appropriate  Objective:   BP 112/80   Pulse 80   Temp 97.7 F (36.5 C) (Temporal)   Ht 5' 11.5" (1.816 m)   Wt 238 lb (108 kg)   SpO2 99%   BMI 32.73 kg/m   GEN: No acute distress; alert,appropriate. PULM: Breathing comfortably in no respiratory distress PSYCH: Normally interactive.  CV: RRR, no m/g/r  PULM: Normal respiratory rate, no accessory muscle use. No wheezes, crackles or rhonchi   Does appear to be in mild pain with generalized movement.  Laboratory and Imaging Data: Results for orders placed or performed in visit on 01/22/21  CBC with Differential/Platelet  Result Value Ref Range   WBC 3.5 (L) 4.0 - 10.5 K/uL   RBC 4.76 4.22 - 5.81 Mil/uL   Hemoglobin 13.9 13.0 - 17.0 g/dL   HCT 41.5 39.0 - 52.0 %   MCV 87.1 78.0 - 100.0 fl   MCHC 33.5 30.0 - 36.0 g/dL   RDW 14.4 11.5 - 15.5 %   Platelets 195.0 150.0 - 400.0 K/uL   Neutrophils Relative % 35.7 (L) 43.0 - 77.0 %   Lymphocytes Relative 48.3 (H) 12.0 - 46.0 %   Monocytes Relative 10.5 3.0 - 12.0 %   Eosinophils Relative 5.0 0.0 - 5.0 %   Basophils Relative 0.5 0.0 - 3.0 %   Neutro Abs 1.2 (L) 1.4 - 7.7 K/uL   Lymphs Abs 1.7 0.7 - 4.0 K/uL   Monocytes Absolute 0.4 0.1 - 1.0 K/uL   Eosinophils Absolute 0.2 0.0 - 0.7 K/uL   Basophils Absolute 0.0 0.0 - 0.1 K/uL  IBC + Ferritin  Result Value Ref Range   Iron 94 42 - 165 ug/dL   Transferrin 190.0 (L) 212.0 - 360.0 mg/dL   Saturation Ratios 35.3 20.0 - 50.0 %   Ferritin 250.0 22.0 - 322.0 ng/mL  Basic metabolic panel  Result Value Ref Range   Sodium 139 135 - 145 mEq/L   Potassium 4.8 3.5 - 5.1 mEq/L   Chloride 100 96 - 112 mEq/L   CO2 32 19 - 32 mEq/L   Glucose, Bld 101 (H) 70 - 99 mg/dL   BUN 14 6 - 23  mg/dL   Creatinine, Ser 1.05 0.40 - 1.50 mg/dL   GFR 79.31 >60.00 mL/min   Calcium 9.8 8.4 - 10.5 mg/dL  Hepatic function panel  Result Value Ref Range   Total Bilirubin 0.6 0.2 - 1.2 mg/dL   Bilirubin, Direct 0.1 0.0 - 0.3 mg/dL  Alkaline Phosphatase 123 (H) 39 - 117 U/L   AST 31 0 - 37 U/L   ALT 33 0 - 53 U/L   Total Protein 7.4 6.0 - 8.3 g/dL   Albumin 4.4 3.5 - 5.2 g/dL  Lipid panel  Result Value Ref Range   Cholesterol 179 0 - 200 mg/dL   Triglycerides 122.0 0.0 - 149.0 mg/dL   HDL 32.20 (L) >39.00 mg/dL   VLDL 24.4 0.0 - 40.0 mg/dL   LDL Cholesterol 122 (H) 0 - 99 mg/dL   Total CHOL/HDL Ratio 6    NonHDL 146.68   PSA, Total with Reflex to PSA, Free  Result Value Ref Range   PSA, Total 0.9 < OR = 4.0 ng/mL  Hemoglobin A1c  Result Value Ref Range   Hgb A1c MFr Bld 5.6 4.6 - 6.5 %  Microalbumin / creatinine urine ratio  Result Value Ref Range   Microalb, Ur <0.7 0.0 - 1.9 mg/dL   Creatinine,U 240.2 mg/dL   Microalb Creat Ratio 0.3 0.0 - 30.0 mg/g     Assessment and Plan:     ICD-10-CM   1. Blood loss anemia  D50.0 CBC with Differential/Platelet    IBC + Ferritin    2. Laceration of spleen, subsequent encounter  S36.039D     3. Multiple rib fractures involving four or more ribs  S22.49XA     4. Diabetes mellitus type 2, diet-controlled (HCC)  C94.7 Basic metabolic panel    Lipid panel    Hemoglobin A1c    Microalbumin / creatinine urine ratio    5. Screening PSA (prostate specific antigen)  Z12.5 PSA, Total with Reflex to PSA, Free    6. Screening, lipid  Z13.220 Lipid panel    7. Encounter for long-term (current) use of medications  Z79.899 Hepatic function panel     Hemoglobin levels have returned to normal as well as ferritin.  Splenic laceration status post interventional radiological procedure with embolization.  I am going to give him the information regarding interventional radiology that is in the general surgical notes.  Multiple rib fractures,  and at this point this is really the biggest thing that is causing him pain and limitation.  I expect that this will heal very slowly.  Dr. Doreatha Martin will plan to follow these.  While I do think it is reasonable for him to walk some, I did recommend that he be very cautious if he has pain and he needs to limit his activity based on this.  Clavicle fracture, this is being managed by orthopedic trauma service.  No orders of the defined types were placed in this encounter.  There are no discontinued medications. Orders Placed This Encounter  Procedures   CBC with Differential/Platelet   IBC + Ferritin   Basic metabolic panel   Hepatic function panel   Lipid panel   PSA, Total with Reflex to PSA, Free   Hemoglobin A1c   Microalbumin / creatinine urine ratio    Follow-up: Return in about 6 weeks (around 03/05/2021).  Signed,  Maud Deed. Ivyonna Hoelzel, MD   Outpatient Encounter Medications as of 01/22/2021  Medication Sig   albuterol (PROVENTIL) (2.5 MG/3ML) 0.083% nebulizer solution Take 3 mLs (2.5 mg total) by nebulization every 6 (six) hours as needed for wheezing or shortness of breath.   amLODipine (NORVASC) 5 MG tablet Take 1 tablet (5 mg total) by mouth daily.   cetirizine (ZYRTEC) 10 MG tablet Take 10 mg by mouth every morning.    cyclobenzaprine (  FLEXERIL) 10 MG tablet Take 0.5-1 tablets (5-10 mg total) by mouth 3 (three) times daily as needed for muscle spasms.   ipratropium (ATROVENT) 0.06 % nasal spray Place 2 sprays into both nostrils 4 (four) times daily.   omeprazole (PRILOSEC) 20 MG capsule Take 1 capsule (20 mg total) by mouth daily.   OVER THE COUNTER MEDICATION Take 1 tablet by mouth daily.   oxyCODONE-acetaminophen (PERCOCET) 5-325 MG tablet Take 1 tablet by mouth every 4 (four) hours as needed for severe pain.   [DISCONTINUED] albuterol (PROVENTIL,VENTOLIN) 90 MCG/ACT inhaler Inhale 2 puffs into the lungs every 6 (six) hours as needed for wheezing.   No facility-administered  encounter medications on file as of 01/22/2021.

## 2021-01-22 ENCOUNTER — Ambulatory Visit (INDEPENDENT_AMBULATORY_CARE_PROVIDER_SITE_OTHER): Payer: Commercial Managed Care - PPO | Admitting: Family Medicine

## 2021-01-22 ENCOUNTER — Other Ambulatory Visit: Payer: Self-pay

## 2021-01-22 ENCOUNTER — Encounter: Payer: Self-pay | Admitting: Family Medicine

## 2021-01-22 VITALS — BP 112/80 | HR 80 | Temp 97.7°F | Ht 71.5 in | Wt 238.0 lb

## 2021-01-22 DIAGNOSIS — S2249XA Multiple fractures of ribs, unspecified side, initial encounter for closed fracture: Secondary | ICD-10-CM | POA: Diagnosis not present

## 2021-01-22 DIAGNOSIS — D500 Iron deficiency anemia secondary to blood loss (chronic): Secondary | ICD-10-CM

## 2021-01-22 DIAGNOSIS — E119 Type 2 diabetes mellitus without complications: Secondary | ICD-10-CM

## 2021-01-22 DIAGNOSIS — Z13220 Encounter for screening for lipoid disorders: Secondary | ICD-10-CM | POA: Diagnosis not present

## 2021-01-22 DIAGNOSIS — Z125 Encounter for screening for malignant neoplasm of prostate: Secondary | ICD-10-CM

## 2021-01-22 DIAGNOSIS — S36039D Unspecified laceration of spleen, subsequent encounter: Secondary | ICD-10-CM | POA: Diagnosis not present

## 2021-01-22 DIAGNOSIS — Z79899 Other long term (current) drug therapy: Secondary | ICD-10-CM

## 2021-01-22 LAB — MICROALBUMIN / CREATININE URINE RATIO
Creatinine,U: 240.2 mg/dL
Microalb Creat Ratio: 0.3 mg/g (ref 0.0–30.0)
Microalb, Ur: <0.7 mg/dL (ref 0.0–1.9)

## 2021-01-22 LAB — IBC + FERRITIN
Ferritin: 250 ng/mL (ref 22.0–322.0)
Iron: 94 ug/dL (ref 42–165)
Saturation Ratios: 35.3 % (ref 20.0–50.0)
Transferrin: 190 mg/dL — ABNORMAL LOW (ref 212.0–360.0)

## 2021-01-22 LAB — HEPATIC FUNCTION PANEL
ALT: 33 U/L (ref 0–53)
AST: 31 U/L (ref 0–37)
Albumin: 4.4 g/dL (ref 3.5–5.2)
Alkaline Phosphatase: 123 U/L — ABNORMAL HIGH (ref 39–117)
Bilirubin, Direct: 0.1 mg/dL (ref 0.0–0.3)
Total Bilirubin: 0.6 mg/dL (ref 0.2–1.2)
Total Protein: 7.4 g/dL (ref 6.0–8.3)

## 2021-01-22 LAB — CBC WITH DIFFERENTIAL/PLATELET
Basophils Absolute: 0 10*3/uL (ref 0.0–0.1)
Basophils Relative: 0.5 % (ref 0.0–3.0)
Eosinophils Absolute: 0.2 10*3/uL (ref 0.0–0.7)
Eosinophils Relative: 5 % (ref 0.0–5.0)
HCT: 41.5 % (ref 39.0–52.0)
Hemoglobin: 13.9 g/dL (ref 13.0–17.0)
Lymphocytes Relative: 48.3 % — ABNORMAL HIGH (ref 12.0–46.0)
Lymphs Abs: 1.7 10*3/uL (ref 0.7–4.0)
MCHC: 33.5 g/dL (ref 30.0–36.0)
MCV: 87.1 fl (ref 78.0–100.0)
Monocytes Absolute: 0.4 10*3/uL (ref 0.1–1.0)
Monocytes Relative: 10.5 % (ref 3.0–12.0)
Neutro Abs: 1.2 10*3/uL — ABNORMAL LOW (ref 1.4–7.7)
Neutrophils Relative %: 35.7 % — ABNORMAL LOW (ref 43.0–77.0)
Platelets: 195 10*3/uL (ref 150.0–400.0)
RBC: 4.76 Mil/uL (ref 4.22–5.81)
RDW: 14.4 % (ref 11.5–15.5)
WBC: 3.5 10*3/uL — ABNORMAL LOW (ref 4.0–10.5)

## 2021-01-22 LAB — LIPID PANEL
Cholesterol: 179 mg/dL (ref 0–200)
HDL: 32.2 mg/dL — ABNORMAL LOW (ref >39.00)
LDL Cholesterol: 122 mg/dL — ABNORMAL HIGH (ref 0–99)
NonHDL: 146.68
Total CHOL/HDL Ratio: 6
Triglycerides: 122 mg/dL (ref 0.0–149.0)
VLDL: 24.4 mg/dL (ref 0.0–40.0)

## 2021-01-22 LAB — BASIC METABOLIC PANEL
BUN: 14 mg/dL (ref 6–23)
CO2: 32 mEq/L (ref 19–32)
Calcium: 9.8 mg/dL (ref 8.4–10.5)
Chloride: 100 mEq/L (ref 96–112)
Creatinine, Ser: 1.05 mg/dL (ref 0.40–1.50)
GFR: 79.31 mL/min (ref >60.00)
Glucose, Bld: 101 mg/dL — ABNORMAL HIGH (ref 70–99)
Potassium: 4.8 mEq/L (ref 3.5–5.1)
Sodium: 139 mEq/L (ref 135–145)

## 2021-01-22 LAB — HEMOGLOBIN A1C: Hgb A1c MFr Bld: 5.6 % (ref 4.6–6.5)

## 2021-01-22 NOTE — Patient Instructions (Signed)
Clarence Alvarez, [place] - 01/15/2021 9:00 AM EDT   Formatting of this note might be different from the original. Continue splenic restrictions until 6-8 weeks from procedure. If the site continues to bother you, please call [phone], the radiology office to ask about this. You may return to work ultimately when cleared by orthopedics and around at least the 6 week time from your injury date. You may call us if you have questions, but otherwise we can see you as needed. Electronically signed by Lanell Persons, PA at 01/15/2021 10:33 AM EDT

## 2021-01-23 LAB — PSA, TOTAL WITH REFLEX TO PSA, FREE: PSA, Total: 0.9 ng/mL (ref <=4.0)

## 2021-01-25 ENCOUNTER — Encounter: Payer: Self-pay | Admitting: Family Medicine

## 2021-01-25 DIAGNOSIS — S2249XA Multiple fractures of ribs, unspecified side, initial encounter for closed fracture: Secondary | ICD-10-CM

## 2021-01-25 HISTORY — DX: Multiple fractures of ribs, unspecified side, initial encounter for closed fracture: S22.49XA

## 2021-02-16 ENCOUNTER — Other Ambulatory Visit: Payer: Self-pay | Admitting: Interventional Radiology

## 2021-02-16 DIAGNOSIS — S36039A Unspecified laceration of spleen, initial encounter: Secondary | ICD-10-CM

## 2021-02-18 ENCOUNTER — Ambulatory Visit
Admission: RE | Admit: 2021-02-18 | Discharge: 2021-02-18 | Disposition: A | Discharge status: Home / Self Care | Payer: Worker's Compensation | Source: Ambulatory Visit | Attending: Interventional Radiology | Admitting: Interventional Radiology

## 2021-02-18 ENCOUNTER — Other Ambulatory Visit: Payer: Self-pay

## 2021-02-18 DIAGNOSIS — S36039A Unspecified laceration of spleen, initial encounter: Secondary | ICD-10-CM

## 2021-02-18 HISTORY — PX: IR RADIOLOGIST EVAL & MGMT: IMG5224

## 2021-02-18 NOTE — Progress Notes (Signed)
Patient ID: Clarence Alvarez, male   DOB: 1965/09/24, 55 y.o.   MRN: [id number]       Chief Complaint: Patient was seen in consultation today for pain post splenic embolization at the request of Peggie Hornak  Referring Physician(s): Georganna Skeans  History of Present Illness: Clarence Alvarez is a 55 y.o. male known to our service from urgent partial splenic embolization 12/16/2020 secondary to splenic laceration and hemoperitoneum from previous fall.  He did well post procedure remained hemodynamically stable with no further drop in his hemoglobin.  No right femoral access issues post procedure.  He did not require laparotomy.  Since his discharge from the hospital, he is complaining of right lower quadrant anterior abdominal wall fluttering sensation.  He requested this visit to discuss the intervention and his current findings.  He is unable to lay flat due to persistent pain from his left rib fractures.  His right lower quadrant symptoms are exacerbated by sitting but not by movement.  Past Medical History:  Diagnosis Date   Anterior displaced fracture of sternal end of left clavicle, initial encounter for closed fracture 12/12/2020   Diabetes mellitus type 2, diet-controlled ([place]) 05/16/2019   GERD (gastroesophageal reflux disease)    History of adenomatous polyp of colon    03-25-2014  tubular adenoma's /  hyperplastic   History of hiatal hernia    History of kidney stones 10/27/16   passed on own   Hypertension    Mild obstructive sleep apnea    per study October 27, 2012, pt states he does not have sleep apnea   Moderate persistent asthma    followed by pcp-- uncomplicated   Multiple rib fractures, 12/2020 traumatic injury 01/25/2021    Past Surgical History:  Procedure Laterality Date   24 HOUR [place] STUDY N/A 05/04/2018   Procedure: [place] STUDY;  Surgeon: Jonathon Bellows, MD;  Location: [facility] ENDOSCOPY;  Service: Gastroenterology;  Laterality: N/A;   anterior cervucal  10/28/2015   with fusion    COLONOSCOPY  03/25/2014 , 10/27/2017   ESOPHAGEAL MANOMETRY N/A 05/04/2018   Procedure: ESOPHAGEAL MANOMETRY (EM);  Surgeon: Jonathon Bellows, MD;  Location: [facility] ENDOSCOPY;  Service: Gastroenterology;  Laterality: N/A;   ESOPHAGOGASTRODUODENOSCOPY (EGD) WITH PROPOFOL N/A 02/24/2018   Procedure: ESOPHAGOGASTRODUODENOSCOPY (EGD) WITH PROPOFOL;  Surgeon: Jonathon Bellows, MD;  Location: [facility] ENDOSCOPY;  Service: Gastroenterology;  Laterality: N/A;   IR ANGIOGRAM FOLLOW UP STUDY  12/16/2020   IR ANGIOGRAM SELECTIVE EACH ADDITIONAL VESSEL  12/16/2020   IR ANGIOGRAM VISCERAL SELECTIVE  12/16/2020   IR EMBO ART  VEN HEMORR LYMPH EXTRAV  INC GUIDE ROADMAPPING  12/16/2020   IR US GUIDE VASC ACCESS RIGHT  12/16/2020   KNEE SURGERY Right 27-Oct-2013   ORIF CLAVICULAR FRACTURE Left 12/19/2020   Procedure: OPEN REDUCTION INTERNAL FIXATION (ORIF) CLAVICULAR FRACTURE;  Surgeon: Shona Needles, MD;  Location: [place];  Service: Orthopedics;  Laterality: Left;   RESECTION DISTAL CLAVICAL Right 12/06/2018   Procedure: RESECTION DISTAL CLAVICAL AND DEBRIDEMENT OF THE SHOULDER;  Surgeon: Elsie Saas, MD;  Location: [place];  Service: Orthopedics;  Laterality: Right;  SCALENE BLOCK   SHOULDER ARTHROSCOPY WITH ROTATOR CUFF REPAIR AND SUBACROMIAL DECOMPRESSION Left 07-24-2010  dr Noemi Chapel @  [facility]   and Labrum tear debridement   SHOULDER ARTHROSCOPY WITH ROTATOR CUFF REPAIR AND SUBACROMIAL DECOMPRESSION Right 12/06/2018   Procedure: SHOULDER ARTHROSCOPY WITH ROTATOR CUFF REPAIR AND SUBACROMIAL DECOMPRESSION;  Surgeon: Elsie Saas, MD;  Location: [place];  Service: Orthopedics;  Laterality: Right;  SCALENE BLOCK    Allergies: Lactose intolerance (gi) and Latex  Medications: Prior to Admission medications   Medication Sig Start Date End Date Taking? Authorizing Provider  albuterol (PROVENTIL) (2.5 MG/3ML) 0.083% nebulizer solution Take 3 mLs (2.5 mg total) by nebulization every 6 (six) hours as  needed for wheezing or shortness of breath. 09/28/19   Ria Bush, MD  amLODipine (NORVASC) 5 MG tablet Take 1 tablet (5 mg total) by mouth daily. 12/20/20   Ralene Ok, MD  cetirizine (ZYRTEC) 10 MG tablet Take 10 mg by mouth every morning.     [provider]  cyclobenzaprine (FLEXERIL) 10 MG tablet Take 0.5-1 tablets (5-10 mg total) by mouth 3 (three) times daily as needed for muscle spasms. 12/24/20 12/24/21  Copland, Frederico Hamman, MD  ipratropium (ATROVENT) 0.06 % nasal spray Place 2 sprays into both nostrils 4 (four) times daily. 12/24/20   Copland, Frederico Hamman, MD  omeprazole (PRILOSEC) 20 MG capsule Take 1 capsule (20 mg total) by mouth daily. 12/20/20   Ralene Ok, MD  OVER THE COUNTER MEDICATION Take 1 tablet by mouth daily.    [provider]  oxyCODONE-acetaminophen (PERCOCET) 5-325 MG tablet Take 1 tablet by mouth every 4 (four) hours as needed for severe pain. 12/24/20 12/24/21  Copland, Frederico Hamman, MD  albuterol (PROVENTIL,VENTOLIN) 90 MCG/ACT inhaler Inhale 2 puffs into the lungs every 6 (six) hours as needed for wheezing. 02/15/11 11/30/18  Katherina Mires, MD     Family History  Problem Relation Age of Onset   Diabetes Maternal Grandmother    Colon cancer Maternal Grandmother        dx age 38 - recurance at age 23   Diabetes Mother    Hepatitis C Mother    Prostate cancer Father    Colon cancer Father        dx - 58's   Diabetes Paternal Grandmother    Mental illness Brother    Mental illness Sister    Mental illness Daughter    Stomach cancer Maternal Grandfather    COPD Other        Aunt   Diabetes Other        siblings   Pancreatic cancer Paternal Aunt    Esophageal cancer Neg Hx    Rectal cancer Neg Hx     Social History   Socioeconomic History   Marital status: Married    Spouse name: Not on file   Number of children: Not on file   Years of education: Not on file   Highest education level: Not on file  Occupational History   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   Vaping Use   Vaping Use: Never used  Substance and Sexual Activity   Alcohol use: Not Currently    Alcohol/week: 3.0 standard drinks    Types: 3 Shots of liquor per week   Drug use: No   Sexual activity: Not on file    Comment: vasectomy-- 2010  Other Topics Concern   Not on file  Social History Narrative  Not on file   Social Determinants of Health   Financial Resource Strain: Not on file  Food Insecurity: Not on file  Transportation Needs: Not on file  Physical Activity: Not on file  Stress: Not on file  Social Connections: Not on file    ECOG Status: 1 - Symptomatic but completely ambulatory  Review of Systems: A 12 point ROS discussed and pertinent positives are indicated in the HPI above.  All other systems are negative.  Review of Systems  Vital Signs: BP 126/77 (BP Location: Right Arm)   Pulse 81   SpO2 98%   Physical Exam Constitutional: Oriented to person, place, and time. Well-developed and well-nourished. No distress.    Ambulates independently. HENT:  Head: Normocephalic and atraumatic.  Eyes: Conjunctivae and EOM are normal. Right eye exhibits no discharge. Left eye exhibits no discharge. No scleral icterus.  Neck: No JVD present.  Pulmonary/Chest: Effort normal. No stridor. No respiratory distress.  Abdomen: soft, non distended Neurological:  alert and oriented to person, place, and time.  Skin: Skin is warm and dry.  not diaphoretic.  Psychiatric:   normal mood and affect.   behavior is normal. Judgment and thought content normal.       Imaging: SPLENIC ARTERIOGRAM  AND EMBOLIZATION   ULTRASOUND GUIDANCE FOR VASCULAR ACCESS   MEDICATIONS: no antibiotics indicated   ANESTHESIA/SEDATION: Intravenous Fentanyl 20mg and Versed 1.'5mg'$  were administered as conscious sedation during continuous monitoring of the patient's level of consciousness and physiological / cardiorespiratory status by the radiology RN, with a total moderate sedation time of 45 minutes.   PROCEDURE: Informed consent was obtained from the patient following explanation of the procedure, risks, benefits and alternatives. The patient understands, agrees and consents for the procedure. All questions were addressed. A time out was performed prior to the initiation of the procedure. Maximal barrier sterile technique utilized including caps, mask, sterile gowns, sterile gloves, large sterile drape, hand hygiene, and Betadine prep.   Patency of the right common femoral artery confirmed with ultrasound. Under real-time ultrasound guidance, the vessel was accessed with a 21-gauge micropuncture needle, exchanged over a 018 guidewire for a transitional dilator, through which a 035 guidewire was advanced. Over this, a 5 French sheath placed. 5 French C2 catheter coaxially advanced and used to selectively catheterize the celiac axis for arteriography. Patent splenic artery identified.   C2 catheter was advanced to the midportion of the splenic artery for selective arteriography. At least 3 sites of pseudoaneurysm/extravasation were evident from superior pole of the spleen. A coaxial Renegade microcatheter was advanced for selective third-order catheterization of the superior branch of the splenic artery for arteriography. Subsequent embolization with 4 and 5 mm coils was performed.   The microcatheter was used for selective arteriography of the inferior branch splenic artery. No aneurysm, pseudoaneurysm, extravasation, or other arterial abnormality was localized in the lower pole of the  spleen. Microcatheter was removed and final splenic arteriogram through the 5 French catheter was performed. Catheter was removed. After confirmatory femoral arteriogram, sheath removed and hemostasis achieved with aid of ExoSeal device. The patient tolerated the procedure well.   CONTRAST:  80 mL Omnipaque 300 IV   FLUOROSCOPY TIME:  10 minutes 6 seconds; [id number]   COMPLICATIONS: None immediate.   IMPRESSION: 1. Technically successful coil embolization of superior branch splenic artery, with no further opacification of sites of pseudoaneurysm/hemorrhage. 2. Arteriography of the inferior pole of the spleen was normal, and perfusion via inferior branch of the splenic artery  was preserved.   I telephoned the results to Dr. Georganna Skeans at the conclusion of the procedure.     Electronically Signed   By: Lucrezia Europe M.D.   On: 12/16/2020 12:34   Labs:  CBC: Recent Labs    12/19/20 0347 12/20/20 0327 12/24/20 1626 01/22/21 1140  WBC 6.4 8.7 7.5 3.5*  HGB 8.9* 8.9* 10.8* 13.9  HCT 26.9* 27.4* 32.1* 41.5  PLT 186 229 386.0 195.0    COAGS: Recent Labs    12/13/20 0630  INR 1.3*  APTT 28    BMP: Recent Labs    12/17/20 0512 12/18/20 0300 12/19/20 0347 12/20/20 0327 12/24/20 1626 01/22/21 1140  NA 137 137 135 134* 137 139  K 3.8 3.6 3.3* 4.4 4.7 4.8  CL 99 99 97* 99 101 100  CO2 '31 28 29 26 26 '$ 32  GLUCOSE 118* 116* 129* 133* 110* 101*  BUN '11 11 12 13 16 14  '$ CALCIUM 8.3* 8.2* 8.5* 8.4* 9.4 9.8  CREATININE 0.91 0.95 0.94 0.86 1.02 1.05  GFRNONAA >60 >60 >60 >60  --   --     LIVER FUNCTION TESTS: Recent Labs    12/13/20 0221 12/14/20 0141 12/24/20 1626 01/22/21 1140  BILITOT 0.7 0.8 1.1 0.6  AST 46* 37 39* 31  ALT 35 29 39 33  ALKPHOS 59 48 109 123*  PROT 6.5 5.8* 7.5 7.4  ALBUMIN 3.7 3.4* 4.2 4.4    TUMOR MARKERS: No results for input(s): AFPTM, CEA, CA199, CHROMGRNA in the last 8760 hours.  Assessment and Plan:  My impression is that  the patient has done well post partial splenic embolization for laceration. We reviewed together the patient and his spouse and I his preprocedure CT demonstrated   splenic laceration and extensive hemoperitoneum.  We reviewed the images from the splenic embolization procedure treating the upper pole with sparing of the lower pole.  We discussed the significance of preservation of the lower pole of the spleen with regards to continued splenic function.  We discussed the timeframe of resolution of the infarcted upper pole of the spleen as well as the hemoperitoneum, and whether this could represent a source of irritation and account for his symptoms.  We discussed that the femoral entry site is remote from his region of current symptomatology and given the very healthy nature of his arterial system, there is no issues with direct access to the splenic artery to suggest an etiology of his symptoms from the splenic intervention.  We discussed that I have no specific activity limitations at this point above beyond what surgery may have recommended.  He and his wife seem to understand, and asked appropriate questions which were answered.  I do not require additional follow-up with him at this point.  He knows he can call with any additional questions or problems.  Thank you for this interesting consult.  I greatly enjoyed meeting TAELON DELROSARIO and look forward to participating in their care.  A copy of this report was sent to the requesting provider on this date.  Electronically Signed: Rickard Rhymes 02/18/2021, 5:01 PM   I spent a total of    25 Minutes in face to face in clinical consultation, greater than 50% of which was counseling/coordinating care for splenic laceration post embolization.

## 2021-02-19 ENCOUNTER — Encounter: Payer: Self-pay | Admitting: *Deleted

## 2021-03-02 ENCOUNTER — Encounter: Payer: Self-pay | Admitting: Family Medicine

## 2021-03-02 ENCOUNTER — Ambulatory Visit (INDEPENDENT_AMBULATORY_CARE_PROVIDER_SITE_OTHER): Payer: Self-pay | Admitting: Family Medicine

## 2021-03-02 ENCOUNTER — Other Ambulatory Visit: Payer: Self-pay

## 2021-03-02 VITALS — BP 108/70 | HR 66 | Temp 97.8°F | Ht 71.0 in | Wt 236.0 lb

## 2021-03-02 DIAGNOSIS — Z0000 Encounter for general adult medical examination without abnormal findings: Secondary | ICD-10-CM

## 2021-03-02 DIAGNOSIS — S2249XA Multiple fractures of ribs, unspecified side, initial encounter for closed fracture: Secondary | ICD-10-CM

## 2021-03-02 MED ORDER — FLUTICASONE PROPIONATE HFA 110 MCG/ACT IN AERO: 1.0000 | INHALATION_SPRAY | Freq: Two times a day (BID) | RESPIRATORY_TRACT | 0 refills | Status: DC

## 2021-03-02 NOTE — Progress Notes (Signed)
Yuto Cajuste T. Hardeep Reetz, MD, [place] at [facility] [place], [address]  Phone: [phone]  FAX: [phone]  DWIJ FOSHEE - 55 y.o. male  MRN [id number]  Date of Birth: [birth year]  Date: 03/02/2021  PCP: Owens Loffler, MD  Referral: Owens Loffler, MD  Chief Complaint  Patient presents with   Annual Exam    This visit occurred during the SARS-CoV-2 public health emergency.  Safety protocols were in place, including screening questions prior to the visit, additional usage of staff PPE, and extensive cleaning of exam room while observing appropriate contact time as indicated for disinfecting solutions.   Patient Care Team: Owens Loffler, MD as PCP - General (Family Medicine) Subjective:   SAMVIT PEOT is a 55 y.o. pleasant patient who presents with the following:  Preventative Health Maintenance Visit:  Health Maintenance Summary Reviewed and updated, unless pt declines services.  Tobacco History Reviewed. Alcohol: No concerns, no excessive use Exercise Habits: limited right now to a degree, but he is trying to be more active without causing pain STD concerns: no risk or activity to increase risk Drug Use: None  F/u multiple rib fractures.  He is doing better, he is steadily improving here.  He still does not feel completely better, but he has made excellent progress, particular compared to his shoulder and his neck.  [place]ki - some ddd, some disk herniation - doing some pt I reviewed his note today. That we will try to manage this conservatively, and if he does not do well after PT, then they might consider an ESI.  Still with pain at the shoulder.  Dr. Doreatha Martin is managing this, and he currently has a 10 pound weight limit.  Trying to maintain a positive outlook. Placing some restriction right now and limiting what he is doing based on pain and shoulder surgical recommendations  Taking some  flexeril right now to  No pain meds.   Eye exam is on the 27th  He did have an elevated hemoglobin A1c in 2020, but this was only 1 time, and he is manage his blood sugars very well with diet alone.  This point, he is also very motivated to have a good diabetic diet and low salt diet.  Health Maintenance  Topic Date Due   FOOT EXAM  Never done   Zoster Vaccines- Shingrix (1 of 2) Never done   Pneumococcal Vaccine 66-14 Years old (2 - PCV) 10/02/2019   OPHTHALMOLOGY EXAM  07/25/2020   COVID-19 Vaccine (4 - Booster for Pfizer series) 09/11/2020   INFLUENZA VACCINE  02/09/2021   HEMOGLOBIN A1C  07/25/2021   URINE MICROALBUMIN  01/22/2022   COLONOSCOPY (Pts 45-73yr Insurance coverage will need to be confirmed)  04/05/2022   TETANUS/TDAP  12/13/2027   PNEUMOCOCCAL POLYSACCHARIDE VACCINE AGE 26-64 HIGH RISK  Completed   Hepatitis C Screening  Completed   HIV Screening  Completed   HPV VACCINES  Aged Out   Immunization History  Administered Date(s) Administered   Influenza Whole 04/11/2012   Influenza-Unspecified 03/12/2016, 04/10/2017, 03/12/2018, 04/06/2019, 05/02/2020   PFIZER(Purple Top)SARS-COV-2 Vaccination 09/08/2019, 09/29/2019, 06/13/2020   Pneumococcal Polysaccharide-23 10/02/2018   Td 05/12/2004   Tdap 12/12/2017   Patient Active Problem List   Diagnosis Date Noted   Multiple rib fractures involving four or more ribs 01/25/2021   Clavicle fracture 12/20/2020   Spleen laceration 12/13/2020   Diabetes mellitus type 2, diet-controlled ([place] 05/16/2019   Traumatic complete tear  of right rotator cuff 11/30/2018   Essential hypertension 03/20/2018   Degeneration of intervertebral disc of cervical spine without disc herniation 02/29/2016   OSA (obstructive sleep apnea) 01/26/2013   Moderate persistent asthma with allergic rhinitis without complication [id number]    Past Medical History:  Diagnosis Date   Anterior displaced fracture of sternal end of left clavicle, initial  encounter for closed fracture 12/12/2020   Diabetes mellitus type 2, diet-controlled ([place]) 05/16/2019   GERD (gastroesophageal reflux disease)    History of adenomatous polyp of colon    03-25-2014  tubular adenoma's /  hyperplastic   History of hiatal hernia    History of kidney stones Oct 03, 2016   passed on own   Hypertension    Mild obstructive sleep apnea    per study 10-03-12, pt states he does not have sleep apnea   Moderate persistent asthma    followed by pcp-- uncomplicated   Multiple rib fractures, 12/2020 traumatic injury 01/25/2021    Past Surgical History:  Procedure Laterality Date   24 HOUR [place] STUDY N/A 05/04/2018   Procedure: [place] STUDY;  Surgeon: Jonathon Bellows, MD;  Location: [facility] ENDOSCOPY;  Service: Gastroenterology;  Laterality: N/A;   anterior cervucal  10-04-2015   with fusion   COLONOSCOPY  03/25/2014 , 10-03-2017   ESOPHAGEAL MANOMETRY N/A 05/04/2018   Procedure: ESOPHAGEAL MANOMETRY (EM);  Surgeon: Jonathon Bellows, MD;  Location: [facility] ENDOSCOPY;  Service: Gastroenterology;  Laterality: N/A;   ESOPHAGOGASTRODUODENOSCOPY (EGD) WITH PROPOFOL N/A 02/24/2018   Procedure: ESOPHAGOGASTRODUODENOSCOPY (EGD) WITH PROPOFOL;  Surgeon: Jonathon Bellows, MD;  Location: [facility] ENDOSCOPY;  Service: Gastroenterology;  Laterality: N/A;   IR ANGIOGRAM FOLLOW UP STUDY  12/16/2020   IR ANGIOGRAM SELECTIVE EACH ADDITIONAL VESSEL  12/16/2020   IR ANGIOGRAM VISCERAL SELECTIVE  12/16/2020   IR EMBO ART  VEN HEMORR LYMPH EXTRAV  INC GUIDE ROADMAPPING  12/16/2020   IR RADIOLOGIST EVAL & MGMT  02/18/2021   IR US GUIDE VASC ACCESS RIGHT  12/16/2020   KNEE SURGERY Right 10/03/13   ORIF CLAVICULAR FRACTURE Left 12/19/2020   Procedure: OPEN REDUCTION INTERNAL FIXATION (ORIF) CLAVICULAR FRACTURE;  Surgeon: Shona Needles, MD;  Location: [place];  Service: Orthopedics;  Laterality: Left;   RESECTION DISTAL CLAVICAL Right 12/06/2018   Procedure: RESECTION DISTAL CLAVICAL AND DEBRIDEMENT OF THE SHOULDER;  Surgeon: Elsie Saas, MD;  Location: [place];  Service: Orthopedics;  Laterality: Right;  SCALENE BLOCK   SHOULDER ARTHROSCOPY WITH ROTATOR CUFF REPAIR AND SUBACROMIAL DECOMPRESSION Left 07-24-2010  dr Noemi Chapel @  [facility]   and [place] tear debridement   SHOULDER ARTHROSCOPY WITH ROTATOR CUFF REPAIR AND SUBACROMIAL DECOMPRESSION Right 12/06/2018   Procedure: SHOULDER ARTHROSCOPY WITH ROTATOR CUFF REPAIR AND SUBACROMIAL DECOMPRESSION;  Surgeon: Elsie Saas, MD;  Location: [place];  Service: Orthopedics;  Laterality: Right;  SCALENE BLOCK    Family History  Problem Relation Age of Onset   Diabetes Maternal Grandmother    Colon cancer Maternal Grandmother        dx age 39 - recurance at age 23   Diabetes Mother    Hepatitis C Mother    Prostate cancer Father    Colon cancer Father        dx - 33's   Diabetes Paternal Grandmother    Mental illness Brother    Mental illness Sister    Mental illness Daughter    Stomach cancer Maternal Grandfather    COPD Other  Aunt   Diabetes Other        siblings   Pancreatic cancer Paternal Aunt    Esophageal cancer Neg Hx    Rectal cancer Neg Hx     Past Medical History, Surgical History, Social History, Family History, Problem List, Medications, and Allergies have been reviewed and updated if relevant.  Review of Systems: Pertinent positives are listed above.  Otherwise, a full 14 point review of systems has been done in full and it is negative except where it is noted positive.  Objective:   BP 108/70   Pulse 66   Temp 97.8 F (36.6 C) (Temporal)   Ht '5\' 11"'$  (1.803 m)   Wt 236 lb (107 kg)   SpO2 96%   BMI 32.92 kg/m  Ideal Body Weight: Weight in (lb) to have BMI = 25: 178.9  Ideal Body Weight: Weight in (lb) to have BMI = 25: 178.9 No results found. Depression screen [facility] 2/9 03/02/2021 02/27/2020 02/08/2019 12/12/2017  Decreased Interest 0 0 0 0  Down, Depressed, Hopeless 1 0 0 1  PHQ - 2 Score 1 0 0 1      GEN: well developed, well nourished, no acute distress Eyes: conjunctiva and lids normal, PERRLA, EOMI ENT: TM clear, nares clear, oral exam WNL Neck: supple, no lymphadenopathy, no thyromegaly, no JVD Pulm: clear to auscultation and percussion, respiratory effort normal CV: regular rate and rhythm, S1-S2, no murmur, rub or gallop, no bruits, peripheral pulses normal and symmetric, no cyanosis, clubbing, edema or varicosities GI: soft, non-tender; no hepatosplenomegaly, masses; active bowel sounds all quadrants GU: deferred Lymph: no cervical, axillary or inguinal adenopathy MSK: gait normal, muscle tone and strength WNL. Some pain at the ribs. I did not do a full shoulder and neck evaluation. SKIN: clear, good turgor, color WNL, no rashes, lesions, or ulcerations Neuro: normal mental status, normal strength, sensation, and motion Psych: alert; oriented to person, place and time, normally interactive and not anxious or depressed in appearance.  All labs reviewed with patient. Results for orders placed or performed in visit on 01/22/21  CBC with Differential/Platelet  Result Value Ref Range   WBC 3.5 (L) 4.0 - 10.5 K/uL   RBC 4.76 4.22 - 5.81 Mil/uL   Hemoglobin 13.9 13.0 - 17.0 g/dL   HCT 41.5 39.0 - 52.0 %   MCV 87.1 78.0 - 100.0 fl   MCHC 33.5 30.0 - 36.0 g/dL   RDW 14.4 11.5 - 15.5 %   Platelets 195.0 150.0 - 400.0 K/uL   Neutrophils Relative % 35.7 (L) 43.0 - 77.0 %   Lymphocytes Relative 48.3 (H) 12.0 - 46.0 %   Monocytes Relative 10.5 3.0 - 12.0 %   Eosinophils Relative 5.0 0.0 - 5.0 %   Basophils Relative 0.5 0.0 - 3.0 %   Neutro Abs 1.2 (L) 1.4 - 7.7 K/uL   Lymphs Abs 1.7 0.7 - 4.0 K/uL   Monocytes Absolute 0.4 0.1 - 1.0 K/uL   Eosinophils Absolute 0.2 0.0 - 0.7 K/uL   Basophils Absolute 0.0 0.0 - 0.1 K/uL  IBC + Ferritin  Result Value Ref Range   Iron 94 42 - 165 ug/dL   Transferrin 190.0 (L) 212.0 - 360.0 mg/dL   Saturation Ratios 35.3 20.0 - 50.0 %    Ferritin 250.0 22.0 - 322.0 ng/mL  Basic metabolic panel  Result Value Ref Range   Sodium 139 135 - 145 mEq/L   Potassium 4.8 3.5 - 5.1 mEq/L   Chloride 100 96 -  112 mEq/L   CO2 32 19 - 32 mEq/L   Glucose, Bld 101 (H) 70 - 99 mg/dL   BUN 14 6 - 23 mg/dL   Creatinine, Ser 1.05 0.40 - 1.50 mg/dL   GFR 79.31 >60.00 mL/min   Calcium 9.8 8.4 - 10.5 mg/dL  Hepatic function panel  Result Value Ref Range   Total Bilirubin 0.6 0.2 - 1.2 mg/dL   Bilirubin, Direct 0.1 0.0 - 0.3 mg/dL   Alkaline Phosphatase 123 (H) 39 - 117 U/L   AST 31 0 - 37 U/L   ALT 33 0 - 53 U/L   Total Protein 7.4 6.0 - 8.3 g/dL   Albumin 4.4 3.5 - 5.2 g/dL  Lipid panel  Result Value Ref Range   Cholesterol 179 0 - 200 mg/dL   Triglycerides 122.0 0.0 - 149.0 mg/dL   HDL 32.20 (L) >39.00 mg/dL   VLDL 24.4 0.0 - 40.0 mg/dL   LDL Cholesterol 122 (H) 0 - 99 mg/dL   Total CHOL/HDL Ratio 6    NonHDL 146.68   PSA, Total with Reflex to PSA, Free  Result Value Ref Range   PSA, Total 0.9 < OR = 4.0 ng/mL  Hemoglobin A1c  Result Value Ref Range   Hgb A1c MFr Bld 5.6 4.6 - 6.5 %  Microalbumin / creatinine urine ratio  Result Value Ref Range   Microalb, Ur <0.7 0.0 - 1.9 mg/dL   Creatinine,U 240.2 mg/dL   Microalb Creat Ratio 0.3 0.0 - 30.0 mg/g    Assessment and Plan:     ICD-10-CM   1. Healthcare maintenance  Z00.00     2. Multiple rib fractures involving four or more ribs  S22.49XA      From a general health standpoint, he is doing well.  He is improving with his rib fractures.  He only needs to follow-up with me as needed if things worsen.  His primary impediment now after his injuries appear to be his shoulder.  Hopefully this will improve, and Dr. Doreatha Martin will continue to follow this.  Hopefully his neck symptoms will improve, and thankfully he has an established relationship with Dr. Lynann Bologna.  He is going to hold off on additional vaccination including Shingrix until after his COVID booster.  Restart  Flovent, asthma.  I encouraged him about his diligence with his diet.  Health Maintenance Exam: The patient's preventative maintenance and recommended screening tests for an annual wellness exam were reviewed in full today. Brought up to date unless services declined.  Counselled on the importance of diet, exercise, and its role in overall health and mortality. The patient's FH and SH was reviewed, including their home life, tobacco status, and drug and alcohol status.  Follow-up in 1 year for physical exam or additional follow-up below.  Follow-up: No follow-ups on file. Or follow-up in 1 year if not noted.  Meds ordered this encounter  Medications   fluticasone (FLOVENT HFA) 110 MCG/ACT inhaler    Sig: Inhale 1 puff into the lungs 2 (two) times daily.    Dispense:  1 g    Refill:  0   Medications Discontinued During This Encounter  Medication Reason   ipratropium (ATROVENT) 0.06 % nasal spray Ineffective   OVER THE COUNTER MEDICATION    oxyCODONE-acetaminophen (PERCOCET) 5-325 MG tablet No longer needed (for PRN medications)   No orders of the defined types were placed in this encounter.   Signed,  Maud Deed. Edwinna Rochette, MD   Allergies as of 03/02/2021  Reactions   Lactose Intolerance (gi) Other (See Comments)   Gas and bloating   Latex Other (See Comments)   Skin irritation dsicoloration/blisters        Medication List        Accurate as of March 02, 2021 11:59 PM. If you have any questions, ask your nurse or doctor.          STOP taking these medications    ipratropium 0.06 % nasal spray Commonly known as: ATROVENT Stopped by: Owens Loffler, MD   OVER THE COUNTER MEDICATION Stopped by: Owens Loffler, MD   oxyCODONE-acetaminophen 5-325 MG tablet Commonly known as: Percocet Stopped by: Owens Loffler, MD       TAKE these medications    albuterol (2.5 MG/3ML) 0.083% nebulizer solution Commonly known as: PROVENTIL Take 3 mLs (2.5 mg  total) by nebulization every 6 (six) hours as needed for wheezing or shortness of breath.   amLODipine 5 MG tablet Commonly known as: NORVASC Take 1 tablet (5 mg total) by mouth daily.   cetirizine 10 MG tablet Commonly known as: ZYRTEC Take 10 mg by mouth every morning.   cyclobenzaprine 10 MG tablet Commonly known as: FLEXERIL Take 0.5-1 tablets (5-10 mg total) by mouth 3 (three) times daily as needed for muscle spasms.   fluticasone 110 MCG/ACT inhaler Commonly known as: Flovent HFA Inhale 1 puff into the lungs 2 (two) times daily. Started by: Owens Loffler, MD   omeprazole 20 MG capsule Commonly known as: PRILOSEC Take 1 capsule (20 mg total) by mouth daily.

## 2021-03-05 ENCOUNTER — Ambulatory Visit: Payer: Self-pay | Admitting: Family Medicine

## 2021-03-05 ENCOUNTER — Ambulatory Visit: Payer: Commercial Managed Care - PPO | Admitting: Family Medicine

## 2021-03-17 ENCOUNTER — Other Ambulatory Visit: Payer: Self-pay | Admitting: Family Medicine

## 2021-03-17 ENCOUNTER — Encounter: Payer: Self-pay | Admitting: Gastroenterology

## 2021-03-25 ENCOUNTER — Telehealth: Payer: Self-pay | Admitting: Family Medicine

## 2021-03-25 NOTE — Telephone Encounter (Signed)
Pt called stating that he filled out medical release form back in July and left at the front desk pt stated that he was requesting notes from hospital stay and office visits. Pt stated that his attorney is requesting these records.Pt requested a call back regarding this

## 2021-03-30 NOTE — Telephone Encounter (Signed)
Called patient and advised him to call the medical records department as they are the ones that process these requests. Patient verbalized understand and states he will let me know if he has any trouble getting these records.

## 2021-04-22 ENCOUNTER — Telehealth: Payer: Self-pay | Admitting: Family Medicine

## 2021-04-22 NOTE — Telephone Encounter (Signed)
Pt called in wanting to know if he can get a flu shot and booster through his employer. [facility] is coming to the employer site. please advise

## 2021-04-23 NOTE — Telephone Encounter (Signed)
Patient called and informed us of his immunizations he received from [facility] at the work site. Patient stated that he was appreciative for [place] to put it into his record.

## 2021-05-18 ENCOUNTER — Other Ambulatory Visit: Payer: Self-pay | Admitting: Family Medicine

## 2021-05-28 ENCOUNTER — Other Ambulatory Visit: Payer: Self-pay | Admitting: Family Medicine

## 2021-06-09 ENCOUNTER — Other Ambulatory Visit: Payer: Self-pay | Admitting: Family Medicine

## 2021-07-01 ENCOUNTER — Other Ambulatory Visit: Payer: Self-pay

## 2021-07-01 ENCOUNTER — Encounter: Payer: Self-pay | Admitting: Nurse Practitioner

## 2021-07-01 ENCOUNTER — Telehealth (INDEPENDENT_AMBULATORY_CARE_PROVIDER_SITE_OTHER): Payer: Self-pay | Admitting: Nurse Practitioner

## 2021-07-01 VITALS — Ht 71.0 in | Wt 220.0 lb

## 2021-07-01 DIAGNOSIS — U071 COVID-19: Secondary | ICD-10-CM

## 2021-07-01 MED ORDER — MOLNUPIRAVIR EUA 200MG CAPSULE: 4.0000 | ORAL_CAPSULE | Freq: Two times a day (BID) | ORAL | 0 refills | Status: AC

## 2021-07-01 NOTE — Patient Instructions (Signed)
Nice to see you today Sent in antiviral treatment If you become short of breath, cannot breathm or develop chest pain please be seen in Emergency room or urgent care

## 2021-07-01 NOTE — Progress Notes (Signed)
Patient ID: Clarence Alvarez, male    DOB: [birth year], 55 y.o.   MRN: [id number]  Virtual visit completed through [facility], a video enabled telemedicine application. Due to national recommendations of social distancing due to COVID-19, a virtual visit is felt to be most appropriate for this patient at this time. Reviewed limitations, risks, security and privacy concerns of performing a virtual visit and the availability of in person appointments. I also reviewed that there may be a patient responsible charge related to this service. The patient agreed to proceed.   Patient location: home Provider location:  at [facility], office Persons participating in this virtual visit: patient, provider   If any vitals were documented, they were collected by patient at home unless specified below.    Ht 5\' 11"  (1.803 m)    Wt 220 lb (99.8 kg)    BMI 30.68 kg/m    CC: Covid 19 Subjective:   HPI: Clarence Alvarez is a 55 y.o. male presenting on 07/01/2021 for Covid Positive (Patient tested positive today. Patient has had fatigue, HA, chest congestion and cough since Saturday. Patient has been taking mucinex for sx. )   Saturday on 06/27/2021 Tested for covid at home and it was positive Pfizer x2 and 2 booster No known sick  besides his grand kids that had pna OTC mucinex with little relief  Has not used his albuterol inhaler any more than normal  Relevant past medical, surgical, family and social history reviewed and updated as indicated. Interim medical history since our last visit reviewed. Allergies and medications reviewed and updated. Outpatient Medications Prior to Visit  Medication Sig Dispense Refill   albuterol (PROVENTIL) (2.5 MG/3ML) 0.083% nebulizer solution Take 3 mLs (2.5 mg total) by nebulization every 6 (six) hours as needed for wheezing or shortness of breath. 150 mL 1   amLODipine (NORVASC) 5 MG tablet TAKE 1 TABLET BY MOUTH EVERY DAY 30 tablet 5   cetirizine  (ZYRTEC) 10 MG tablet Take 10 mg by mouth every morning.      cyclobenzaprine (FLEXERIL) 10 MG tablet Take 0.5-1 tablets (5-10 mg total) by mouth 3 (three) times daily as needed for muscle spasms. 50 tablet 1   FLOVENT HFA 110 MCG/ACT inhaler TAKE 1 PUFF BY MOUTH TWICE A DAY 12 g 5   fluticasone (FLONASE) 50 MCG/ACT nasal spray Place 2 sprays into both nostrils daily.     omeprazole (PRILOSEC) 20 MG capsule TAKE 1 CAPSULE BY MOUTH EVERY DAY 90 capsule 3   No facility-administered medications prior to visit.     Per HPI unless specifically indicated in ROS section below Review of Systems  Constitutional:  Positive for chills and fatigue. Negative for fever.  HENT:  Positive for congestion, sinus pressure, sinus pain and sore throat. Negative for ear discharge and ear pain.   Respiratory:  Positive for cough (yellowish) and chest tightness. Negative for shortness of breath.   Cardiovascular:  Negative for chest pain.  Gastrointestinal:  Negative for abdominal pain, diarrhea, nausea and vomiting.  Musculoskeletal:  Positive for arthralgias. Negative for myalgias.  Neurological:  Positive for headaches.  Objective:  Ht 5\' 11"  (1.803 m)    Wt 220 lb (99.8 kg)    BMI 30.68 kg/m   Wt Readings from Last 3 Encounters:  07/01/21 220 lb (99.8 kg)  03/02/21 236 lb (107 kg)  01/22/21 238 lb (108 kg)       Physical exam: Gen: alert, NAD, not ill appearing Pulm: speaks in  complete sentences without increased work of breathing Psych: normal mood, normal thought content      Results for orders placed or performed in visit on 01/22/21  CBC with Differential/Platelet  Result Value Ref Range   WBC 3.5 (L) 4.0 - 10.5 K/uL   RBC 4.76 4.22 - 5.81 Mil/uL   Hemoglobin 13.9 13.0 - 17.0 g/dL   HCT 41.5 39.0 - 52.0 %   MCV 87.1 78.0 - 100.0 fl   MCHC 33.5 30.0 - 36.0 g/dL   RDW 14.4 11.5 - 15.5 %   Platelets 195.0 150.0 - 400.0 K/uL   Neutrophils Relative % 35.7 (L) 43.0 - 77.0 %   Lymphocytes  Relative 48.3 (H) 12.0 - 46.0 %   Monocytes Relative 10.5 3.0 - 12.0 %   Eosinophils Relative 5.0 0.0 - 5.0 %   Basophils Relative 0.5 0.0 - 3.0 %   Neutro Abs 1.2 (L) 1.4 - 7.7 K/uL   Lymphs Abs 1.7 0.7 - 4.0 K/uL   Monocytes Absolute 0.4 0.1 - 1.0 K/uL   Eosinophils Absolute 0.2 0.0 - 0.7 K/uL   Basophils Absolute 0.0 0.0 - 0.1 K/uL  IBC + Ferritin  Result Value Ref Range   Iron 94 42 - 165 ug/dL   Transferrin 190.0 (L) 212.0 - 360.0 mg/dL   Saturation Ratios 35.3 20.0 - 50.0 %   Ferritin 250.0 22.0 - 322.0 ng/mL  Basic metabolic panel  Result Value Ref Range   Sodium 139 135 - 145 mEq/L   Potassium 4.8 3.5 - 5.1 mEq/L   Chloride 100 96 - 112 mEq/L   CO2 32 19 - 32 mEq/L   Glucose, Bld 101 (H) 70 - 99 mg/dL   BUN 14 6 - 23 mg/dL   Creatinine, Ser 1.05 0.40 - 1.50 mg/dL   GFR 79.31 >60.00 mL/min   Calcium 9.8 8.4 - 10.5 mg/dL  Hepatic function panel  Result Value Ref Range   Total Bilirubin 0.6 0.2 - 1.2 mg/dL   Bilirubin, Direct 0.1 0.0 - 0.3 mg/dL   Alkaline Phosphatase 123 (H) 39 - 117 U/L   AST 31 0 - 37 U/L   ALT 33 0 - 53 U/L   Total Protein 7.4 6.0 - 8.3 g/dL   Albumin 4.4 3.5 - 5.2 g/dL  Lipid panel  Result Value Ref Range   Cholesterol 179 0 - 200 mg/dL   Triglycerides 122.0 0.0 - 149.0 mg/dL   HDL 32.20 (L) >39.00 mg/dL   VLDL 24.4 0.0 - 40.0 mg/dL   LDL Cholesterol 122 (H) 0 - 99 mg/dL   Total CHOL/HDL Ratio 6    NonHDL 146.68   PSA, Total with Reflex to PSA, Free  Result Value Ref Range   PSA, Total 0.9 < OR = 4.0 ng/mL  Hemoglobin A1c  Result Value Ref Range   Hgb A1c MFr Bld 5.6 4.6 - 6.5 %  Microalbumin / creatinine urine ratio  Result Value Ref Range   Microalb, Ur <0.7 0.0 - 1.9 mg/dL   Creatinine,U 240.2 mg/dL   Microalb Creat Ratio 0.3 0.0 - 30.0 mg/g   Assessment & Plan:   Problem List Items Addressed This Visit       Other   COVID-19 - Primary    Patient tested positive for COVID he does have several comorbidities including age  weight asthma and diabetes.  Did discuss antiviral treatments today and that they are EUA only.  Patient is on Flovent and a calcium channel blocker.  We will  opt to use molnupiravir.  Did discuss side effects of medication including possibility of reproductive uncertainty.  Patient has grandkids and states he is not planning on having more children.  Did discuss guidelines in regards to CDC recommendations for quarantine.      Relevant Medications   molnupiravir EUA (LAGEVRIO) 200 mg CAPS capsule     No orders of the defined types were placed in this encounter.  No orders of the defined types were placed in this encounter.   I discussed the assessment and treatment plan with the patient. The patient was provided an opportunity to ask questions and all were answered. The patient agreed with the plan and demonstrated an understanding of the instructions. The patient was advised to call back or seek an in-person evaluation if the symptoms worsen or if the condition fails to improve as anticipated.  Follow up plan: No follow-ups on file.  Romilda Garret, NP

## 2021-07-01 NOTE — Assessment & Plan Note (Signed)
Patient tested positive for COVID he does have several comorbidities including age weight asthma and diabetes.  Did discuss antiviral treatments today and that they are EUA only.  Patient is on Flovent and a calcium channel blocker.  We will opt to use molnupiravir.  Did discuss side effects of medication including possibility of reproductive uncertainty.  Patient has grandkids and states he is not planning on having more children.  Did discuss guidelines in regards to CDC recommendations for quarantine.

## 2021-08-05 ENCOUNTER — Other Ambulatory Visit: Payer: Self-pay | Admitting: Family Medicine

## 2021-08-05 NOTE — Telephone Encounter (Signed)
Last office visit 07/01/2021 (virtual) with Clarence Alvarez for Covid-19.  Last refilled:  Not on current medication list.  Note on refill states: The original prescription was discontinued on 12/20/2020 by Ralene Ok, MD for the following reason: Stop Taking at Discharge. Renewing this prescription may not be appropriate.  No future appointments.  Refill?

## 2021-08-17 IMAGING — DX DG HIP (WITH OR WITHOUT PELVIS) 2-3V*L*
3 series · 3 of 3 positions shown · non-contrast
Comparison: None.

CLINICAL DATA: Evaluate for osteoarthritis, left hip and deep groin
pain

EXAM:
DG HIP (WITH OR WITHOUT PELVIS) 2-3V LEFT

[pelvis ap]
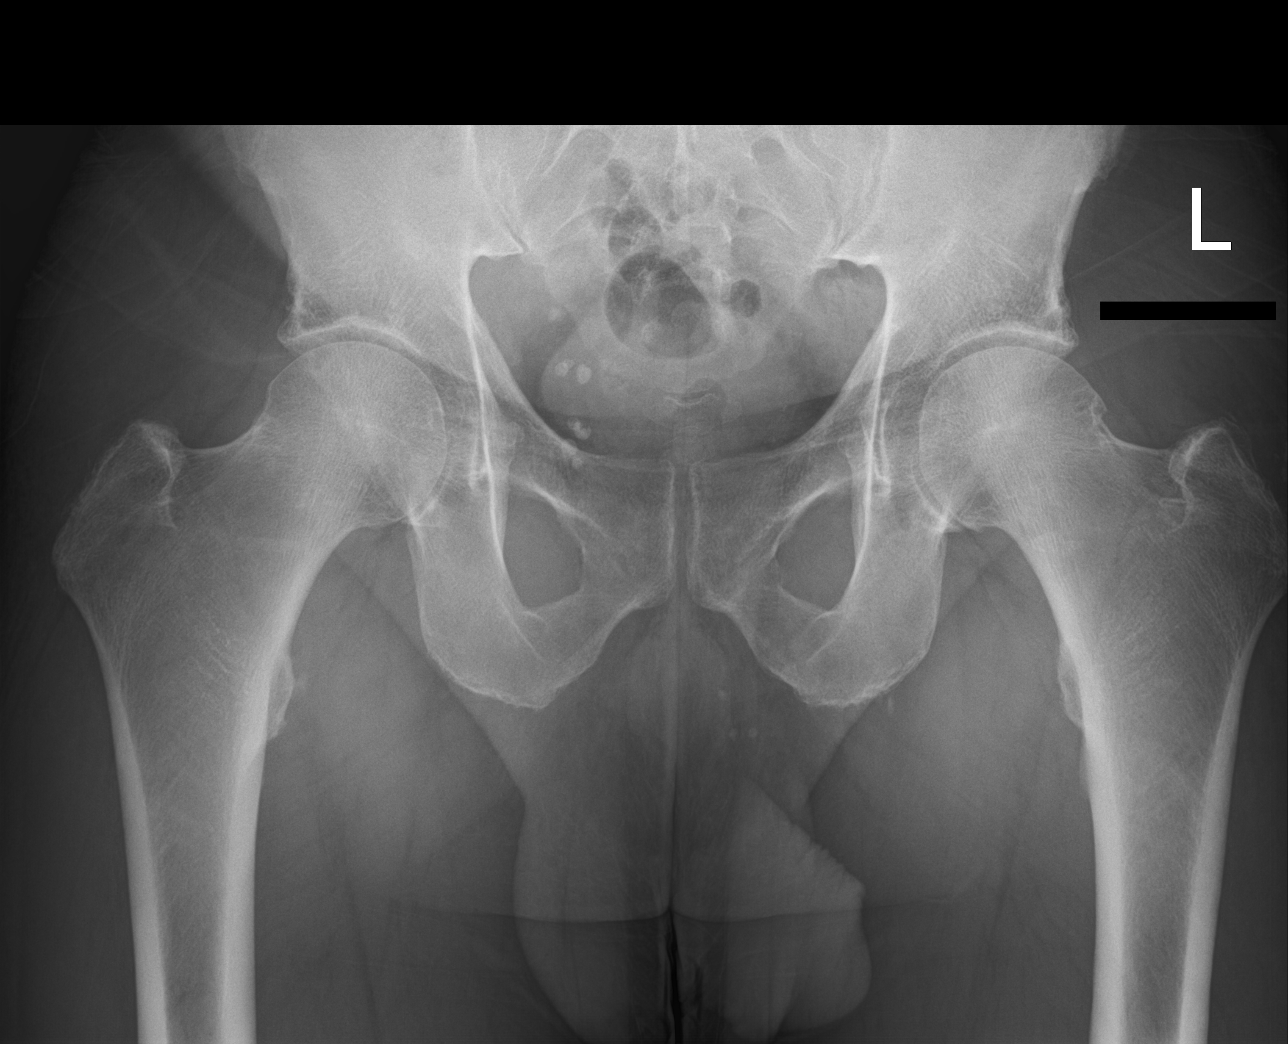

[hip ap]
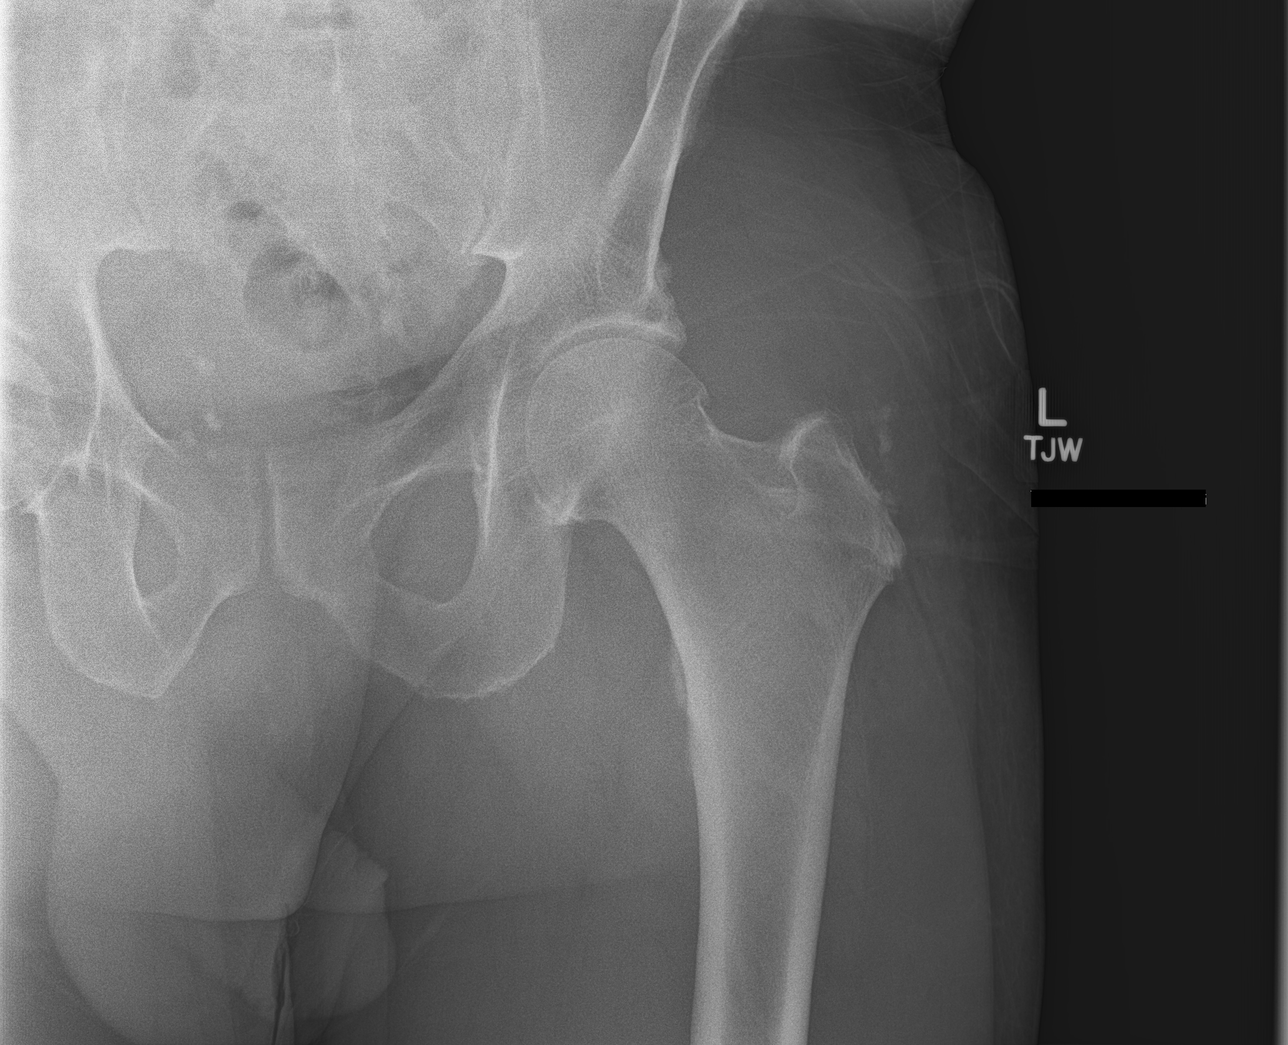

[hip lat]
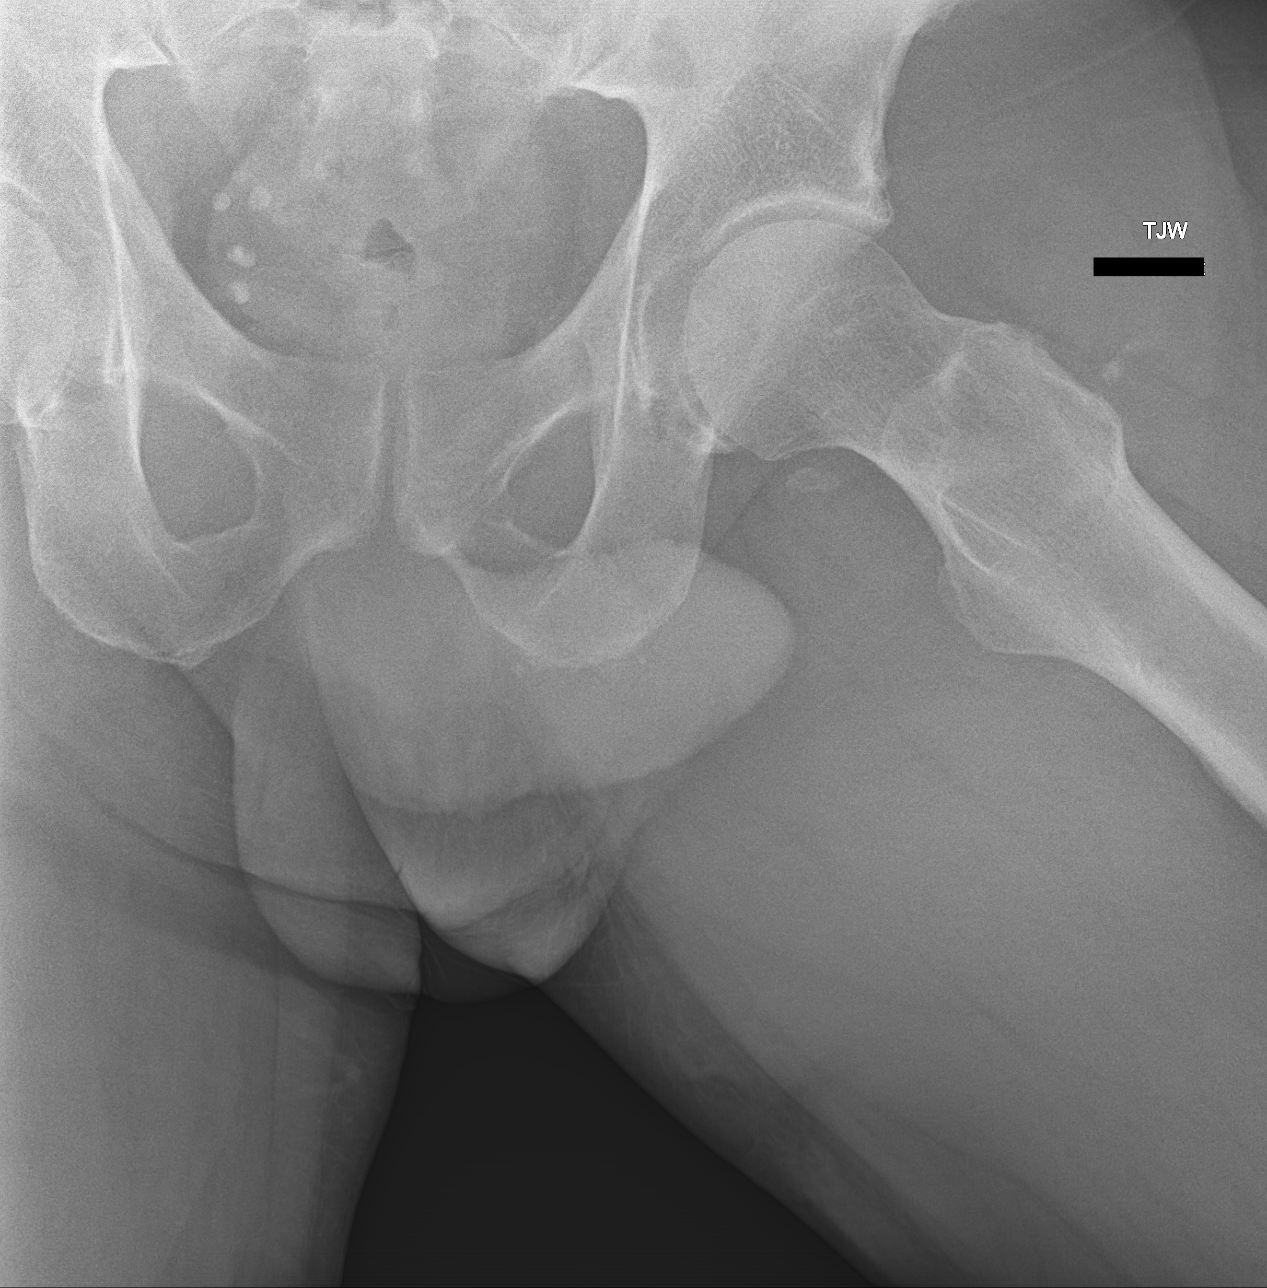

[3 of 3 positions shown; findings below may reference images not displayed]

FINDINGS: There is no evidence of hip fracture or dislocation. Mild
degenerative spurring of the left hip. Pelvic phleboliths.
IMPRESSION: Mild degenerative change left hip.  No acute osseous abnormality.

## 2021-09-18 ENCOUNTER — Ambulatory Visit: Payer: Self-pay | Admitting: Student

## 2021-09-21 ENCOUNTER — Other Ambulatory Visit: Payer: Self-pay

## 2021-09-21 ENCOUNTER — Encounter (HOSPITAL_COMMUNITY): Payer: Self-pay | Admitting: Student

## 2021-09-21 NOTE — Progress Notes (Signed)
Spoke with pt for pre-op call. Pt denies cardiac history. Pt is treated for HTN. Pt states he is pre-diabetic. Last A1C was 5.6 on 01/22/21. Pt states he does not check his blood sugar at home.  ? ?Pt's surgery is scheduled as ambulatory so no Covid test is required prior to surgery. ? ?

## 2021-09-22 NOTE — H&P (View-Only) (Signed)
Orthopaedic Trauma Service (OTS) H&P ? ?Patient ID: ?Clarence Alvarez ?MRN: [id number] ?DOB/AGE: [birth year] 56 y.o. ? ?Reason for Surgery: Hardware removal left clavicle ? ?HPI: Clarence Alvarez is an 56 y.o. male presenting for removal of left clavicle hardware. Patient had a fall while at work in June 2022, landing on left arm. Was seen in the emergency department and found to have left clavicle fracture. He underwent ORIF by DR. [name] on 12/19/21. Fracture has gone on to heal but patient continues to have pain in the shoulder and neck, which he feels is coming from the hardware. He presents for removal of hardware. ? ?Past Medical History:  ?Diagnosis Date  ? Anemia   ? spleen injury  ? Anterior displaced fracture of sternal end of left clavicle, initial encounter for closed fracture 12/12/2020  ? Arthritis   ? right knee  ? COVID   ? fatigue, headaches - 2 times in 09/27/2020  ? GERD (gastroesophageal reflux disease)   ? History of adenomatous polyp of colon   ? 03-25-2014  tubular adenoma's /  hyperplastic  ? History of hiatal hernia   ? History of kidney stones 09/27/16  ? passed on own  ? Hypertension   ? Mild obstructive sleep apnea   ? per study Sep 27, 2012, pt states he does not have sleep apnea  ? Moderate persistent asthma   ? followed by pcp-- uncomplicated  ? Multiple rib fractures, 12/2020 traumatic injury 01/25/2021  ? Pneumonia   ? Pre-diabetes   ? Spleen laceration September 27, 2020  ? ? ?Past Surgical History:  ?Procedure Laterality Date  ? 50 HOUR [place] STUDY N/A 05/04/2018  ? Procedure: [place] STUDY;  Surgeon: Jonathon Bellows, MD;  Location: [facility] ENDOSCOPY;  Service: Gastroenterology;  Laterality: N/A;  ? anterior cervucal  09/28/2015  ? with fusion  ? COLONOSCOPY  03/25/2014 , 09-27-2017  ? ESOPHAGEAL MANOMETRY N/A 05/04/2018  ? Procedure: ESOPHAGEAL MANOMETRY (EM);  Surgeon: Jonathon Bellows, MD;  Location: [facility] ENDOSCOPY;  Service: Gastroenterology;  Laterality: N/A;  ? ESOPHAGOGASTRODUODENOSCOPY (EGD) WITH PROPOFOL N/A 02/24/2018  ? Procedure:  ESOPHAGOGASTRODUODENOSCOPY (EGD) WITH PROPOFOL;  Surgeon: Jonathon Bellows, MD;  Location: [facility] ENDOSCOPY;  Service: Gastroenterology;  Laterality: N/A;  ? IR ANGIOGRAM FOLLOW UP STUDY  12/16/2020  ? IR ANGIOGRAM SELECTIVE EACH ADDITIONAL VESSEL  12/16/2020  ? IR ANGIOGRAM VISCERAL SELECTIVE  12/16/2020  ? IR EMBO ART  VEN HEMORR LYMPH EXTRAV  INC GUIDE ROADMAPPING  12/16/2020  ? IR RADIOLOGIST EVAL & MGMT  02/18/2021  ? IR US GUIDE VASC ACCESS RIGHT  12/16/2020  ? KNEE SURGERY Right 09/27/13  ? ORIF CLAVICULAR FRACTURE Left 12/19/2020  ? Procedure: OPEN REDUCTION INTERNAL FIXATION (ORIF) CLAVICULAR FRACTURE;  Surgeon: Shona Needles, MD;  Location: [place];  Service: Orthopedics;  Laterality: Left;  ? RESECTION DISTAL CLAVICAL Right 12/06/2018  ? Procedure: RESECTION DISTAL CLAVICAL AND DEBRIDEMENT OF THE SHOULDER;  Surgeon: Elsie Saas, MD;  Location: [place];  Service: Orthopedics;  Laterality: Right;  SCALENE BLOCK  ? SHOULDER ARTHROSCOPY WITH ROTATOR CUFF REPAIR AND SUBACROMIAL DECOMPRESSION Left 07-24-2010  dr Noemi Chapel @  [place]  ? and Labrum tear debridement  ? SHOULDER ARTHROSCOPY WITH ROTATOR CUFF REPAIR AND SUBACROMIAL DECOMPRESSION Right 12/06/2018  ? Procedure: SHOULDER ARTHROSCOPY WITH ROTATOR CUFF REPAIR AND SUBACROMIAL DECOMPRESSION;  Surgeon: Elsie Saas, MD;  Location: [place];  Service: Orthopedics;  Laterality: Right;  SCALENE BLOCK  ? ? ?Family History  ?Problem Relation Age of Onset  ?  Diabetes Maternal Grandmother   ? Colon cancer Maternal Grandmother   ?     dx age 90 - recurance at age 52  ? Diabetes Mother   ? Hepatitis C Mother   ? Prostate cancer Father   ? Colon cancer Father   ?     dx - 52's  ? Diabetes Paternal Grandmother   ? Mental illness Brother   ? Mental illness Sister   ? Mental illness Daughter   ? Stomach cancer Maternal Grandfather   ? COPD Other   ?     Aunt  ? Diabetes Other   ?     siblings  ? Pancreatic cancer Paternal Aunt   ? Esophageal cancer  Neg Hx   ? Rectal cancer Neg Hx   ? ? ?Social History:  reports that he quit smoking about 37 years ago. His smoking use included cigarettes. He quit smokeless tobacco use about 6 years ago.  His smokeless tobacco use included snuff. He reports that he does not currently use alcohol after a past usage of about 3.0 standard drinks per week. He reports that he does not use drugs. ? ?Allergies:  ?Allergies  ?Allergen Reactions  ? Lactose Intolerance (Gi) Other (See Comments)  ?  Gas and bloating  ? Latex Other (See Comments)  ?  Skin irritation dsicoloration/blisters  ? ? ?Medications: I have reviewed the patient's current medications. ?Prior to Admission:  ?No medications prior to admission.  ? ? ?ROS: Constitutional: No fever or chills ?Vision: No changes in vision ?ENT: No difficulty swallowing ?CV: No chest pain ?Pulm: No SOB or wheezing ?GI: No nausea or vomiting ?GU: No urgency or inability to hold urine ?Skin: No poor wound healing ?Neurologic: No numbness or tingling ?Psychiatric: No depression or anxiety ?Heme: No bruising ?Allergic: No reaction to medications or food ? ? ?Exam: ?There were no vitals taken for this visit. ?General:NAD ?Orientation: Alert and oriented x4 ?Mood and Affect: Mood and affect appropriate, pleasant and cooperative ?Gait: Within normal limits ?Coordination and balance: Within normal limits ? ?LUE: Well-healed surgical incision.tender about shoulder and into lateral neck. Shoulder motion within normal limits. Endorses sensation throughout. Neurovascularly intact ? ?RUE: Skin without lesions. No tenderness to palpation. Full painless ROM, full strength in each muscle groups without evidence of instability. ? ? ?Medical Decision Making: ?Data: ?Imaging: Two views of left clavicle shows fully healed midshaft clavicle fracture. No signs of hardware failure/loosening ? ?Labs: No results found for this or any previous visit (from the past 168 hour(s)). ? ? ?Assessment/Plan: ?56 year old male  status post ORIF left clavicle fracture 12/19/2020 ? ?Patient continues to have pain in the shoulder and up into the neck which he feels is partially coming from the hardware. He would like to proceed with hardware removal and I feel at this point it is appropriate to do so.   Risks and benefits of the procedure were discussed with the patient he agrees to proceed.  Consent will be obtained.  We will plan to discharge patient home postoperatively and allow for weightbearing as tolerated left upper extremity ? ?Gwinda Passe PA-C ?[facility] ?[phone] (office) ?[email or web address] ? ? ?

## 2021-09-22 NOTE — H&P (Signed)
Orthopaedic Trauma Service (OTS) H&P ? ?Patient ID: ?Clarence Alvarez ?MRN: [id number] ?DOB/AGE: 01/15/1966 56 y.o. ? ?Reason for Surgery: Hardware removal left clavicle ? ?HPI: Clarence Alvarez is an 56 y.o. male presenting for removal of left clavicle hardware. Patient had a fall while at work in June 2022, landing on left arm. Was seen in the emergency department and found to have left clavicle fracture. He underwent ORIF by DR. [name] on 12/19/21. Fracture has gone on to heal but patient continues to have pain in the shoulder and neck, which he feels is coming from the hardware. He presents for removal of hardware. ? ?Past Medical History:  ?Diagnosis Date  ? Anemia   ? spleen injury  ? Anterior displaced fracture of sternal end of left clavicle, initial encounter for closed fracture 12/12/2020  ? Arthritis   ? right knee  ? COVID   ? fatigue, headaches - 2 times in 2020/10/19  ? GERD (gastroesophageal reflux disease)   ? History of adenomatous polyp of colon   ? 03-25-2014  tubular adenoma's /  hyperplastic  ? History of hiatal hernia   ? History of kidney stones 19-Oct-2016  ? passed on own  ? Hypertension   ? Mild obstructive sleep apnea   ? per study Oct 19, 2012, pt states he does not have sleep apnea  ? Moderate persistent asthma   ? followed by pcp-- uncomplicated  ? Multiple rib fractures, 12/2020 traumatic injury 01/25/2021  ? Pneumonia   ? Pre-diabetes   ? Spleen laceration 2020/10/19  ? ? ?Past Surgical History:  ?Procedure Laterality Date  ? 55 HOUR [place] STUDY N/A 05/04/2018  ? Procedure: [place] STUDY;  Surgeon: Jonathon Bellows, MD;  Location: [facility] ENDOSCOPY;  Service: Gastroenterology;  Laterality: N/A;  ? anterior cervucal  Oct 20, 2015  ? with fusion  ? COLONOSCOPY  03/25/2014 , Oct 19, 2017  ? ESOPHAGEAL MANOMETRY N/A 05/04/2018  ? Procedure: ESOPHAGEAL MANOMETRY (EM);  Surgeon: Jonathon Bellows, MD;  Location: [facility] ENDOSCOPY;  Service: Gastroenterology;  Laterality: N/A;  ? ESOPHAGOGASTRODUODENOSCOPY (EGD) WITH PROPOFOL N/A 02/24/2018  ? Procedure:  ESOPHAGOGASTRODUODENOSCOPY (EGD) WITH PROPOFOL;  Surgeon: Jonathon Bellows, MD;  Location: [facility] ENDOSCOPY;  Service: Gastroenterology;  Laterality: N/A;  ? IR ANGIOGRAM FOLLOW UP STUDY  12/16/2020  ? IR ANGIOGRAM SELECTIVE EACH ADDITIONAL VESSEL  12/16/2020  ? IR ANGIOGRAM VISCERAL SELECTIVE  12/16/2020  ? IR EMBO ART  VEN HEMORR LYMPH EXTRAV  INC GUIDE ROADMAPPING  12/16/2020  ? IR RADIOLOGIST EVAL & MGMT  02/18/2021  ? IR US GUIDE VASC ACCESS RIGHT  12/16/2020  ? KNEE SURGERY Right 10-19-2013  ? ORIF CLAVICULAR FRACTURE Left 12/19/2020  ? Procedure: OPEN REDUCTION INTERNAL FIXATION (ORIF) CLAVICULAR FRACTURE;  Surgeon: Shona Needles, MD;  Location: [place];  Service: Orthopedics;  Laterality: Left;  ? RESECTION DISTAL CLAVICAL Right 12/06/2018  ? Procedure: RESECTION DISTAL CLAVICAL AND DEBRIDEMENT OF THE SHOULDER;  Surgeon: Elsie Saas, MD;  Location: [place];  Service: Orthopedics;  Laterality: Right;  SCALENE BLOCK  ? SHOULDER ARTHROSCOPY WITH ROTATOR CUFF REPAIR AND SUBACROMIAL DECOMPRESSION Left 07-24-2010  dr Noemi Chapel @  [place]  ? and Labrum tear debridement  ? SHOULDER ARTHROSCOPY WITH ROTATOR CUFF REPAIR AND SUBACROMIAL DECOMPRESSION Right 12/06/2018  ? Procedure: SHOULDER ARTHROSCOPY WITH ROTATOR CUFF REPAIR AND SUBACROMIAL DECOMPRESSION;  Surgeon: Elsie Saas, MD;  Location: [place];  Service: Orthopedics;  Laterality: Right;  SCALENE BLOCK  ? ? ?Family History  ?Problem Relation Age of Onset  ?  Diabetes Maternal Grandmother   ? Colon cancer Maternal Grandmother   ?     dx age 82 - recurance at age 29  ? Diabetes Mother   ? Hepatitis C Mother   ? Prostate cancer Father   ? Colon cancer Father   ?     dx - 31's  ? Diabetes Paternal Grandmother   ? Mental illness Brother   ? Mental illness Sister   ? Mental illness Daughter   ? Stomach cancer Maternal Grandfather   ? COPD Other   ?     Aunt  ? Diabetes Other   ?     siblings  ? Pancreatic cancer Paternal Aunt   ? Esophageal cancer  Neg Hx   ? Rectal cancer Neg Hx   ? ? ?Social History:  reports that he quit smoking about 37 years ago. His smoking use included cigarettes. He quit smokeless tobacco use about 6 years ago.  His smokeless tobacco use included snuff. He reports that he does not currently use alcohol after a past usage of about 3.0 standard drinks per week. He reports that he does not use drugs. ? ?Allergies:  ?Allergies  ?Allergen Reactions  ? Lactose Intolerance (Gi) Other (See Comments)  ?  Gas and bloating  ? Latex Other (See Comments)  ?  Skin irritation dsicoloration/blisters  ? ? ?Medications: I have reviewed the patient's current medications. ?Prior to Admission:  ?No medications prior to admission.  ? ? ?ROS: Constitutional: No fever or chills ?Vision: No changes in vision ?ENT: No difficulty swallowing ?CV: No chest pain ?Pulm: No SOB or wheezing ?GI: No nausea or vomiting ?GU: No urgency or inability to hold urine ?Skin: No poor wound healing ?Neurologic: No numbness or tingling ?Psychiatric: No depression or anxiety ?Heme: No bruising ?Allergic: No reaction to medications or food ? ? ?Exam: ?There were no vitals taken for this visit. ?General:NAD ?Orientation: Alert and oriented x4 ?Mood and Affect: Mood and affect appropriate, pleasant and cooperative ?Gait: Within normal limits ?Coordination and balance: Within normal limits ? ?LUE: Well-healed surgical incision.tender about shoulder and into lateral neck. Shoulder motion within normal limits. Endorses sensation throughout. Neurovascularly intact ? ?RUE: Skin without lesions. No tenderness to palpation. Full painless ROM, full strength in each muscle groups without evidence of instability. ? ? ?Medical Decision Making: ?Data: ?Imaging: Two views of left clavicle shows fully healed midshaft clavicle fracture. No signs of hardware failure/loosening ? ?Labs: No results found for this or any previous visit (from the past 168 hour(s)). ? ? ?Assessment/Plan: ?56 year old male  status post ORIF left clavicle fracture 12/19/2020 ? ?Patient continues to have pain in the shoulder and up into the neck which he feels is partially coming from the hardware. He would like to proceed with hardware removal and I feel at this point it is appropriate to do so.   Risks and benefits of the procedure were discussed with the patient he agrees to proceed.  Consent will be obtained.  We will plan to discharge patient home postoperatively and allow for weightbearing as tolerated left upper extremity ? ?Gwinda Passe PA-C ?[facility] ?[phone] (office) ?[email or web address] ? ? ?

## 2021-09-23 ENCOUNTER — Ambulatory Visit (HOSPITAL_COMMUNITY)
Admission: RE | Admit: 2021-09-23 | Payer: No Typology Code available for payment source | Source: Home / Self Care | Admitting: Student

## 2021-09-23 HISTORY — DX: Anemia, unspecified: D64.9

## 2021-09-23 HISTORY — DX: COVID-19: U07.1

## 2021-09-23 SURGERY — REMOVAL, HARDWARE
Anesthesia: General | Laterality: Left

## 2021-09-28 ENCOUNTER — Encounter (HOSPITAL_COMMUNITY): Payer: Self-pay | Admitting: Student

## 2021-09-28 ENCOUNTER — Ambulatory Visit: Payer: Self-pay | Admitting: Student

## 2021-09-28 ENCOUNTER — Other Ambulatory Visit: Payer: Self-pay

## 2021-09-28 NOTE — Pre-Procedure Instructions (Signed)
?[id number] [place], [place] - [id number]?Phone: [phone]: [phone]? ? ?PCP - COwens Loffler MD ? ?Chest x-ray - 12/13/20 ?EKG - 12/19/20 ? ? ?ERAS Protcol - NPO ? ?COVID TEST- N ? ?Anesthesia review: N ? ?Patient verbally denies any shortness of breath, fever, cough and chest pain during phone call ? ? ?-------------  SDW INSTRUCTIONS given: ? ?Your procedure is scheduled on 09/30/21. ? [facility]., and check in at the Admitting office. ? Call this number if you have problems the morning of surgery: ? [phone] ? ? Remember: ? Do not eat or drink after midnight the night before your surgery: ? ? ? Take these medicines the morning of surgery with A SIP OF WATER: ?amLODipine (NORVASC)  ?cetirizine (ZYRTEC)  ?omeprazole (PRILOSEC)  ?albuterol (PROVENTIL) neb-if needed ?albuterol (VENTOLIN HFA)-if needed ?fluticasone (FLONASE)-if needed ? ?As of today, STOP taking any Aspirin (unless otherwise instructed by your surgeon) Aleve, Naproxen, Ibuprofen, Motrin, Advil, Goody's, BC's, all herbal medications, fish oil, and all vitamins. ? ?         ?           Do not wear jewelry, make up, or nail polish ?           Do not wear lotions, powders, perfumes/colognes, or deodorant. ?           Do not shave 48 hours prior to surgery.  Men may shave face and neck. ?           Do not bring valuables to the hospital. ?           [facility] is not responsible for any belongings or valuables. ? ?Do NOT Smoke (Tobacco/Vaping) 24 hours prior to your procedure ?If you use a CPAP at night, you may bring all equipment for your overnight stay. ?  ?Contacts, glasses, dentures or bridgework may not be worn into surgery.    ?  ?For patients admitted to the hospital, discharge time will be determined by your treatment team. ?  ?Patients discharged the day of surgery will not be allowed to  drive home, and someone needs to stay with them for 24 hours. ? ? ? ?Special instructions:   ?[place]- Preparing For Surgery ? ?Before surgery, you can play an important role. Because skin is not sterile, your skin needs to be as free of germs as possible. You can reduce the number of germs on your skin by washing with CHG (chlorahexidine gluconate) Soap before surgery.  CHG is an antiseptic cleaner which kills germs and bonds with the skin to continue killing germs even after washing.   ? ?Oral Hygiene is also important to reduce your risk of infection.  Remember - BRUSH YOUR TEETH THE MORNING OF SURGERY WITH YOUR REGULAR TOOTHPASTE ? ?Please do not use if you have an allergy to CHG or antibacterial soaps. If your skin becomes reddened/irritated stop using the CHG.  ?Do not shave (including legs and underarms) for at least 48 hours prior to first CHG shower. It is OK to shave your face. ? ?Please follow these instructions carefully. ?  ?Shower the NIGHT BEFORE SURGERY and the MORNING OF SURGERY with DIAL Soap.  ? ?Pat yourself dry with a CLEAN TOWEL. ? ?Wear CLEAN PAJAMAS to bed the night before surgery ? ?Place CLEAN SHEETS on your bed the  night of your first shower and DO NOT SLEEP WITH PETS. ? ? ?Day of Surgery: ?Please shower morning of surgery  ?Wear Clean/Comfortable clothing the morning of surgery ?Do not apply any deodorants/lotions.   ?Remember to brush your teeth WITH YOUR REGULAR TOOTHPASTE. ?  ?Questions were answered. Patient verbalized understanding of instructions.  ? ? ?    ?

## 2021-09-30 ENCOUNTER — Other Ambulatory Visit: Payer: Self-pay

## 2021-09-30 ENCOUNTER — Ambulatory Visit (HOSPITAL_COMMUNITY)
Admission: RE | Admit: 2021-09-30 | Discharge: 2021-09-30 | Disposition: A | Discharge status: Home / Self Care | Payer: No Typology Code available for payment source | Attending: Student | Admitting: Student

## 2021-09-30 ENCOUNTER — Encounter (HOSPITAL_COMMUNITY): Payer: Self-pay | Admitting: Student

## 2021-09-30 ENCOUNTER — Ambulatory Visit (HOSPITAL_COMMUNITY): Payer: No Typology Code available for payment source

## 2021-09-30 ENCOUNTER — Ambulatory Visit (HOSPITAL_BASED_OUTPATIENT_CLINIC_OR_DEPARTMENT_OTHER): Payer: No Typology Code available for payment source | Admitting: Anesthesiology

## 2021-09-30 ENCOUNTER — Ambulatory Visit (HOSPITAL_COMMUNITY): Payer: No Typology Code available for payment source | Admitting: Anesthesiology

## 2021-09-30 ENCOUNTER — Encounter (HOSPITAL_COMMUNITY)
Admission: RE | Disposition: A | Discharge status: Home / Self Care | Payer: Self-pay | Source: Home / Self Care | Attending: Student

## 2021-09-30 DIAGNOSIS — J4540 Moderate persistent asthma, uncomplicated: Secondary | ICD-10-CM | POA: Insufficient documentation

## 2021-09-30 DIAGNOSIS — T8484XA Pain due to internal orthopedic prosthetic devices, implants and grafts, initial encounter: Secondary | ICD-10-CM | POA: Insufficient documentation

## 2021-09-30 DIAGNOSIS — X58XXXA Exposure to other specified factors, initial encounter: Secondary | ICD-10-CM | POA: Diagnosis not present

## 2021-09-30 DIAGNOSIS — Z8616 Personal history of COVID-19: Secondary | ICD-10-CM | POA: Diagnosis not present

## 2021-09-30 DIAGNOSIS — Z87891 Personal history of nicotine dependence: Secondary | ICD-10-CM | POA: Diagnosis not present

## 2021-09-30 DIAGNOSIS — K219 Gastro-esophageal reflux disease without esophagitis: Secondary | ICD-10-CM | POA: Diagnosis not present

## 2021-09-30 DIAGNOSIS — I10 Essential (primary) hypertension: Secondary | ICD-10-CM | POA: Diagnosis not present

## 2021-09-30 DIAGNOSIS — G4733 Obstructive sleep apnea (adult) (pediatric): Secondary | ICD-10-CM | POA: Insufficient documentation

## 2021-09-30 HISTORY — PX: HARDWARE REMOVAL: SHX979

## 2021-09-30 LAB — SURGICAL PCR SCREEN

## 2021-09-30 LAB — CBC
HCT: 42.3 % (ref 39.0–52.0)
Hemoglobin: 14.1 g/dL (ref 13.0–17.0)
MCH: 29.6 pg (ref 26.0–34.0)
MCHC: 33.3 g/dL (ref 30.0–36.0)
MCV: 88.9 fL (ref 80.0–100.0)
Platelets: 187 10*3/uL (ref 150–400)
RBC: 4.76 MIL/uL (ref 4.22–5.81)
RDW: 12.7 % (ref 11.5–15.5)
WBC: 3.5 10*3/uL — ABNORMAL LOW (ref 4.0–10.5)
nRBC: 0 % (ref 0.0–0.2)

## 2021-09-30 LAB — BASIC METABOLIC PANEL
Anion gap: 7 (ref 5–15)
BUN: 13 mg/dL (ref 6–20)
CO2: 28 mmol/L (ref 22–32)
Calcium: 9.2 mg/dL (ref 8.9–10.3)
Chloride: 102 mmol/L (ref 98–111)
Creatinine, Ser: 1.2 mg/dL (ref 0.61–1.24)
GFR, Estimated: >60 mL/min (ref >60)
Glucose, Bld: 111 mg/dL — ABNORMAL HIGH (ref 70–99)
Potassium: 4.3 mmol/L (ref 3.5–5.1)
Sodium: 137 mmol/L (ref 135–145)

## 2021-09-30 SURGERY — REMOVAL, HARDWARE
Anesthesia: General | Laterality: Left

## 2021-09-30 MED ORDER — ACETAMINOPHEN 500 MG PO TABS
1000.0000 mg | ORAL_TABLET | Freq: Once | ORAL | Status: AC
  Administered 2021-09-30: 1000 mg via ORAL

## 2021-09-30 MED ORDER — ONDANSETRON HCL 4 MG/2ML IJ SOLN
INTRAMUSCULAR | Status: AC
  Filled 2021-09-30: qty 2

## 2021-09-30 MED ORDER — PROPOFOL 10 MG/ML IV BOLUS
INTRAVENOUS | Status: AC
  Filled 2021-09-30: qty 20

## 2021-09-30 MED ORDER — CEFAZOLIN SODIUM-DEXTROSE 2-4 GM/100ML-% IV SOLN
INTRAVENOUS | Status: AC
Start: 2021-09-30 — End: 2021-09-30
  Filled 2021-09-30: qty 100

## 2021-09-30 MED ORDER — ROCURONIUM BROMIDE 10 MG/ML (PF) SYRINGE
PREFILLED_SYRINGE | INTRAVENOUS | Status: DC | PRN
  Administered 2021-09-30: 100 mg via INTRAVENOUS

## 2021-09-30 MED ORDER — CHLORHEXIDINE GLUCONATE 0.12 % MT SOLN: 15.0000 mL | Freq: Once | OROMUCOSAL | Status: AC

## 2021-09-30 MED ORDER — FENTANYL CITRATE (PF) 250 MCG/5ML IJ SOLN
INTRAMUSCULAR | Status: AC
Start: 2021-09-30 — End: ?
  Filled 2021-09-30: qty 5

## 2021-09-30 MED ORDER — 0.9 % SODIUM CHLORIDE (POUR BTL) OPTIME
TOPICAL | Status: DC | PRN
  Administered 2021-09-30: 1000 mL

## 2021-09-30 MED ORDER — VANCOMYCIN HCL 1000 MG IV SOLR
INTRAVENOUS | Status: AC
  Filled 2021-09-30: qty 20

## 2021-09-30 MED ORDER — DEXAMETHASONE SODIUM PHOSPHATE 10 MG/ML IJ SOLN
INTRAMUSCULAR | Status: AC
  Filled 2021-09-30: qty 1

## 2021-09-30 MED ORDER — ROCURONIUM BROMIDE 10 MG/ML (PF) SYRINGE
PREFILLED_SYRINGE | INTRAVENOUS | Status: AC
  Filled 2021-09-30: qty 10

## 2021-09-30 MED ORDER — OXYCODONE HCL 5 MG/5ML PO SOLN: 5.0000 mg | Freq: Once | ORAL | Status: DC | PRN

## 2021-09-30 MED ORDER — ACETAMINOPHEN 10 MG/ML IV SOLN
INTRAVENOUS | Status: AC
  Filled 2021-09-30: qty 100

## 2021-09-30 MED ORDER — MIDAZOLAM HCL 2 MG/2ML IJ SOLN
INTRAMUSCULAR | Status: DC | PRN
  Administered 2021-09-30: 2 mg via INTRAVENOUS

## 2021-09-30 MED ORDER — LIDOCAINE 2% (20 MG/ML) 5 ML SYRINGE
INTRAMUSCULAR | Status: AC
  Filled 2021-09-30: qty 5

## 2021-09-30 MED ORDER — OXYCODONE HCL 5 MG PO TABS: 5.0000 mg | ORAL_TABLET | Freq: Once | ORAL | Status: DC | PRN

## 2021-09-30 MED ORDER — HYDROCODONE-ACETAMINOPHEN 7.5-325 MG PO TABS: 1.0000 | ORAL_TABLET | ORAL | 0 refills | Status: DC | PRN

## 2021-09-30 MED ORDER — MIDAZOLAM HCL 2 MG/2ML IJ SOLN
INTRAMUSCULAR | Status: AC
  Filled 2021-09-30: qty 2

## 2021-09-30 MED ORDER — ORAL CARE MOUTH RINSE: 15.0000 mL | Freq: Once | OROMUCOSAL | Status: AC

## 2021-09-30 MED ORDER — DEXAMETHASONE SODIUM PHOSPHATE 10 MG/ML IJ SOLN
INTRAMUSCULAR | Status: DC | PRN
  Administered 2021-09-30: 10 mg via INTRAVENOUS

## 2021-09-30 MED ORDER — LACTATED RINGERS IV SOLN: INTRAVENOUS | Status: DC

## 2021-09-30 MED ORDER — FENTANYL CITRATE (PF) 250 MCG/5ML IJ SOLN
INTRAMUSCULAR | Status: DC | PRN
  Administered 2021-09-30: 100 ug via INTRAVENOUS
  Administered 2021-09-30: 50 ug via INTRAVENOUS
  Administered 2021-09-30: 100 ug via INTRAVENOUS

## 2021-09-30 MED ORDER — AMISULPRIDE (ANTIEMETIC) 5 MG/2ML IV SOLN: 10.0000 mg | Freq: Once | INTRAVENOUS | Status: DC | PRN

## 2021-09-30 MED ORDER — HYDROMORPHONE HCL 1 MG/ML IJ SOLN: 0.2500 mg | INTRAMUSCULAR | Status: DC | PRN

## 2021-09-30 MED ORDER — SUGAMMADEX SODIUM 200 MG/2ML IV SOLN
INTRAVENOUS | Status: DC | PRN
  Administered 2021-09-30: 400 mg via INTRAVENOUS

## 2021-09-30 MED ORDER — CHLORHEXIDINE GLUCONATE 0.12 % MT SOLN
OROMUCOSAL | Status: AC
  Administered 2021-09-30: 15 mL via OROMUCOSAL
  Filled 2021-09-30: qty 15

## 2021-09-30 MED ORDER — LIDOCAINE 2% (20 MG/ML) 5 ML SYRINGE
INTRAMUSCULAR | Status: DC | PRN
  Administered 2021-09-30: 60 mg via INTRAVENOUS

## 2021-09-30 MED ORDER — ONDANSETRON HCL 4 MG/2ML IJ SOLN
INTRAMUSCULAR | Status: DC | PRN
  Administered 2021-09-30: 4 mg via INTRAVENOUS

## 2021-09-30 MED ORDER — BUPIVACAINE-EPINEPHRINE 0.5% -1:200000 IJ SOLN
INTRAMUSCULAR | Status: AC
  Filled 2021-09-30: qty 1

## 2021-09-30 MED ORDER — BUPIVACAINE-EPINEPHRINE (PF) 0.5% -1:200000 IJ SOLN
INTRAMUSCULAR | Status: DC | PRN
  Administered 2021-09-30: 15 mL

## 2021-09-30 MED ORDER — ONDANSETRON HCL 4 MG/2ML IJ SOLN: 4.0000 mg | Freq: Once | INTRAMUSCULAR | Status: DC | PRN

## 2021-09-30 MED ORDER — PROPOFOL 10 MG/ML IV BOLUS
INTRAVENOUS | Status: DC | PRN
  Administered 2021-09-30: 200 mg via INTRAVENOUS

## 2021-09-30 MED ORDER — PHENYLEPHRINE 40 MCG/ML (10ML) SYRINGE FOR IV PUSH (FOR BLOOD PRESSURE SUPPORT)
PREFILLED_SYRINGE | INTRAVENOUS | Status: DC | PRN
  Administered 2021-09-30: 80 ug via INTRAVENOUS

## 2021-09-30 MED ORDER — CEFAZOLIN SODIUM-DEXTROSE 2-4 GM/100ML-% IV SOLN
2.0000 g | INTRAVENOUS | Status: AC
  Administered 2021-09-30: 2 g via INTRAVENOUS

## 2021-09-30 SURGICAL SUPPLY — 44 items
ADH SKN CLS APL DERMABOND .7 (GAUZE/BANDAGES/DRESSINGS) ×1
APL PRP STRL LF DISP 70% ISPRP (MISCELLANEOUS) ×1
BAG COUNTER SPONGE SURGICOUNT (BAG) ×2 IMPLANT
BAG SPNG CNTER NS LX DISP (BAG) ×1
BNDG ELASTIC 4X5.8 VLCR STR LF (GAUZE/BANDAGES/DRESSINGS) ×2 IMPLANT
BNDG ELASTIC 6X5.8 VLCR STR LF (GAUZE/BANDAGES/DRESSINGS) ×2 IMPLANT
BRUSH SCRUB EZ PLAIN DRY (MISCELLANEOUS) ×4 IMPLANT
CHLORAPREP W/TINT 26 (MISCELLANEOUS) ×2 IMPLANT
COVER SURGICAL LIGHT HANDLE (MISCELLANEOUS) ×4 IMPLANT
DERMABOND ADVANCED (GAUZE/BANDAGES/DRESSINGS) ×1
DERMABOND ADVANCED .7 DNX12 (GAUZE/BANDAGES/DRESSINGS) IMPLANT
DRAPE C-ARM 42X72 X-RAY (DRAPES) IMPLANT
DRAPE C-ARMOR (DRAPES) ×2 IMPLANT
DRAPE U-SHAPE 47X51 STRL (DRAPES) ×2 IMPLANT
DRSG MEPILEX BORDER 4X8 (GAUZE/BANDAGES/DRESSINGS) ×1 IMPLANT
ELECT REM PT RETURN 9FT ADLT (ELECTROSURGICAL) ×2
ELECTRODE REM PT RTRN 9FT ADLT (ELECTROSURGICAL) ×1 IMPLANT
GLOVE SURG ENC MOIS LTX SZ6.5 (GLOVE) ×6 IMPLANT
GLOVE SURG ENC MOIS LTX SZ7.5 (GLOVE) ×8 IMPLANT
GLOVE SURG UNDER POLY LF SZ6.5 (GLOVE) ×2 IMPLANT
GLOVE SURG UNDER POLY LF SZ7.5 (GLOVE) ×2 IMPLANT
GOWN STRL REUS W/ TWL LRG LVL3 (GOWN DISPOSABLE) ×2 IMPLANT
GOWN STRL REUS W/TWL LRG LVL3 (GOWN DISPOSABLE) ×4
KIT BASIN OR (CUSTOM PROCEDURE TRAY) ×2 IMPLANT
KIT TURNOVER KIT B (KITS) ×2 IMPLANT
MANIFOLD NEPTUNE II (INSTRUMENTS) ×2 IMPLANT
NEEDLE 22X1 1/2 (OR ONLY) (NEEDLE) IMPLANT
NS IRRIG 1000ML POUR BTL (IV SOLUTION) ×2 IMPLANT
PACK ORTHO EXTREMITY (CUSTOM PROCEDURE TRAY) ×2 IMPLANT
PAD ARMBOARD 7.5X6 YLW CONV (MISCELLANEOUS) ×4 IMPLANT
SPONGE T-LAP 18X18 ~~LOC~~+RFID (SPONGE) ×2 IMPLANT
STAPLER VISISTAT 35W (STAPLE) IMPLANT
SUCTION FRAZIER HANDLE 10FR (MISCELLANEOUS)
SUCTION TUBE FRAZIER 10FR DISP (MISCELLANEOUS) IMPLANT
SUT VIC AB 0 CT1 27 (SUTURE)
SUT VIC AB 0 CT1 27XBRD ANBCTR (SUTURE) IMPLANT
SUT VIC AB 2-0 CT1 27 (SUTURE)
SUT VIC AB 2-0 CT1 TAPERPNT 27 (SUTURE) IMPLANT
SYR CONTROL 10ML LL (SYRINGE) IMPLANT
TOWEL GREEN STERILE (TOWEL DISPOSABLE) ×4 IMPLANT
TOWEL GREEN STERILE FF (TOWEL DISPOSABLE) ×4 IMPLANT
TUBE CONNECTING 12X1/4 (SUCTIONS) ×2 IMPLANT
UNDERPAD 30X36 HEAVY ABSORB (UNDERPADS AND DIAPERS) ×2 IMPLANT
YANKAUER SUCT BULB TIP NO VENT (SUCTIONS) ×2 IMPLANT

## 2021-09-30 NOTE — Anesthesia Procedure Notes (Signed)
Procedure Name: Intubation ?Date/Time: 09/30/2021 7:42 AM ?Performed by: Lance Coon, CRNA ?Pre-anesthesia Checklist: Patient identified, Emergency Drugs available, Suction available, Patient being monitored and Timeout performed ?Patient Re-evaluated:Patient Re-evaluated prior to induction ?Oxygen Delivery Method: Circle system utilized ?Preoxygenation: Pre-oxygenation with 100% oxygen ?Induction Type: IV induction ?Ventilation: Mask ventilation without difficulty ?Laryngoscope Size: Sabra Heck and 3 ?Grade View: Grade I ?Tube type: Oral ?Tube size: 7.5 mm ?Number of attempts: 1 ?Airway Equipment and Method: Stylet ?Placement Confirmation: ETT inserted through vocal cords under direct vision, positive ETCO2 and breath sounds checked- equal and bilateral ?Secured at: 23 cm ?Tube secured with: Tape ?Dental Injury: Teeth and Oropharynx as per pre-operative assessment  ? ? ? ? ?

## 2021-09-30 NOTE — Progress Notes (Signed)
Orthopedic Tech Progress Note ?Patient Details:  ?Clarence Alvarez ?1965/07/26 ?[id number] ? ?PACU RN called requesting an ARM SLING for patient. ? ?Ortho Devices ?Type of Ortho Device: Arm sling ?Ortho Device/Splint Location: LUE ?Ortho Device/Splint Interventions: Ordered ?  ?Post Interventions ?Patient Tolerated: Well ?Instructions Provided: Care of device ? ?Janit Pagan ?09/30/2021, 10:54 AM ? ?

## 2021-09-30 NOTE — Op Note (Signed)
Orthopaedic Surgery Operative Note (CSN: [id number] ) ?Date of Surgery: 09/30/2021  ?Admit Date: 09/30/2021  ? ?Diagnoses: ?Pre-Op Diagnoses: ?Painful left clavicle orthopaedic hardware ? ?Post-Op Diagnosis: ?Same ? ?Procedures: ?CPT 26080-Removal of hardware left clavicle ? ?Surgeons : ?Primary: Shona Needles, MD ? ?Assistant: Patrecia Pace, PA-C ? ?Location: OR 7  ? ?Anesthesia:General  ? ?Antibiotics: Ancef 2g preop  ? ?Tourniquet time:None   ? ?Estimated Blood Loss:Minimal ? ?Complications:None  ? ?Specimens:None  ? ?Implants: ?* No implants in log *  ? ?Indications for Surgery: ?56 year old male who fell at work sustaining a left clavicle fracture.  He underwent open reduction internal fixation.  He subsequently went on to heal this fracture uneventfully however he continued to have significant pain in his left shoulder.  I discussed risks and benefits of proceeding hardware removal.  Risks include but not limited to bleeding, infection, persistent pain, nerve or blood vessel injury, anesthetic complications.  He agreed to proceed with surgery and consent was obtained. ? ?Operative Findings: ?Successful removal of hardware from left clavicle without subsequent complication. ? ?Procedure: ?The patient was identified in the preoperative holding area. Consent was confirmed with the patient and their family and all questions were answered. The operative extremity was marked after confirmation with the patient. he was then brought back to the operating room by our anesthesia colleagues.  He was carefully transferred over to radiolucent flat top table.  He was placed under general anesthetic.  The left upper extremity was then prepped and draped in usual sterile fashion.  A timeout was performed to verify the patient, the procedure, and the extremity.  Preoperative antibiotics were dosed. ? ?Fluoroscopic imaging showed the hardware that was in place.  I then proceeded to open up the previous incision and carried it  down through skin and subcutaneous tissue.  I then exposed the medial and lateral portions of the plate.  I then removed the 4 medial screws and 4 lateral screws without difficulty.  I then manipulated the plate and remove this without any difficulty.  For scopic imaging showed that all hardware was removed.  The incision was copiously irrigated.  Layered closure with 2-0 Vicryl and 3-0 Monocryl with Dermabond was used to close the skin.  Gel dressing was applied.  The patient was awoken from anesthesia and taken to the PACU in stable condition. ? ?Post Op Plan/Instructions: ?Patient will be weightbearing as tolerated to the left upper extremity.  No DVT prophylaxis is needed in this healthy upper extremity patient.  Plan to follow-up in approximately 2 weeks for x-rays and wound check. ? ?I was present and performed the entire surgery. ? ?Patrecia Pace, PA-C did assist me throughout the case. An assistant was necessary given the difficulty in approach, maintenance of reduction and ability to instrument the fracture. ? ? ?Katha Hamming, MD ?[facility]  ?

## 2021-09-30 NOTE — Interval H&P Note (Signed)
History and Physical Interval Note: ? ?09/30/2021 ?7:22 AM ? ?Clarence Alvarez  has presented today for surgery, with the diagnosis of Painful hardware.  The various methods of treatment have been discussed with the patient and family. After consideration of risks, benefits and other options for treatment, the patient has consented to  Procedure(s): ?HARDWARE REMOVAL LEFT CLAVICLE (Left) as a surgical intervention.  The patient's history has been reviewed, patient examined, no change in status, stable for surgery.  I have reviewed the patient's chart and labs.  Questions were answered to the patient's satisfaction.   ? ? ?Lennette Bihari P Avayah Raffety ? ? ?

## 2021-09-30 NOTE — Discharge Instructions (Addendum)
? ?[facility] Discharge Instructions ? ? ?General Discharge Instructions ? ?WEIGHT BEARING STATUS:Weightbearing as tolerated left upper extremity ? ?RANGE OF MOTION/ACTIVITY: Ok for unrestricted shoulder range of motion as tolerated ? ?Wound Care: You may remove your surgical  dressing on post-op day #2 (Friday, 10/02/21). Incision is closed with dissolvable suture and sin glue. The incision can be left open to air if there is no drainage. If incision continues to have drainage, follow wound care instructions below. Okay to shower and begin getting incisions wet starting post-op day #3 (Saturday 10/03/21). Do not scrub incision, simply allow soap and water to run over it. ? ?DVT/PE prophylaxis: None ? ?Diet: as you were eating previously.  Can use over the counter stool softeners and bowel preparations, such as Miralax, to help with bowel movements.  Narcotics can be constipating.  Be sure to drink plenty of fluids ? ?PAIN MEDICATION USE AND EXPECTATIONS ? You have likely been given narcotic medications to help control your pain.  After a traumatic event that results in an fracture (broken bone) with or without surgery, it is ok to use narcotic pain medications to help control one's pain.  We understand that everyone responds to pain differently and each individual patient will be evaluated on a regular basis for the continued need for narcotic medications. Ideally, narcotic medication use should last no more than 6-8 weeks (coinciding with fracture healing).  ? As a patient it is your responsibility as well to monitor narcotic medication use and report the amount and frequency you use these medications when you come to your office visit.  ? We would also advise that if you are using narcotic medications, you should take a dose prior to therapy to maximize you participation. ? ?IF YOU ARE ON NARCOTIC MEDICATIONS IT IS NOT PERMISSIBLE TO OPERATE A MOTOR VEHICLE (MOTORCYCLE/CAR/TRUCK/MOPED) OR HEAVY  MACHINERY ?DO NOT MIX NARCOTICS WITH OTHER CNS ([place]) DEPRESSANTS SUCH AS ALCOHOL ? ? ?STOP SMOKING OR USING NICOTINE PRODUCTS!!!! ? As discussed nicotine severely impairs your body's ability to heal surgical and traumatic wounds but also impairs bone healing.  Wounds and bone heal by forming microscopic blood vessels (angiogenesis) and nicotine is a vasoconstrictor (essentially, shrinks blood vessels).  Therefore, if vasoconstriction occurs to these microscopic blood vessels they essentially disappear and are unable to deliver necessary nutrients to the healing tissue.  This is one modifiable factor that you can do to dramatically increase your chances of healing your injury.   ? (This means no smoking, no nicotine gum, patches, etc) ? ? ?ICE AND ELEVATE INJURED/OPERATIVE EXTREMITY ? Using ice and elevating the injured extremity above your heart can help with swelling and pain control.  Icing in a pulsatile fashion, such as 20 minutes on and 20 minutes off, can be followed.   ? Do not place ice directly on skin. Make sure there is a barrier between to skin and the ice pack.   ? Using frozen items such as frozen peas works well as the conform nicely to the are that needs to be iced. ? ?USE AN ACE WRAP OR TED HOSE FOR SWELLING CONTROL ? In addition to icing and elevation, Ace wraps or TED hose are used to help limit and resolve swelling.  It is recommended to use Ace wraps or TED hose until you are informed to stop.   ? When using Ace Wraps start the wrapping distally (farthest away from the body) and wrap proximally (closer to the body) ?  Example: If you had surgery on your leg or thing and you do not have a splint on, start the ace wrap at the toes and work your way up to the thigh ?       If you had surgery on your upper extremity and do not have a splint on, start the ace wrap at your fingers and work your way up to the upper arm ? ?CALL THE OFFICE WITH ANY QUESTIONS OR CONCERNS: [phone]   ? ?VISIT OUR WEBSITE FOR ADDITIONAL INFORMATION: [email or web address] ?  ? ?Discharge Wound Care Instructions ? ?Do NOT apply any ointments, solutions or lotions to pin sites or surgical wounds.  These prevent needed drainage and even though solutions like hydrogen peroxide kill bacteria, they also damage cells lining the pin sites that help fight infection.  Applying lotions or ointments can keep the wounds moist and can cause them to breakdown and open up as well. This can increase the risk for infection. When in doubt call the office. ? ?If any drainage is noted, use foam dressing over incision ? ?Once the incision is completely dry and without drainage, it may be left open to air out.  Showering may begin 36-48 hours later.  Cleaning gently with soap and water. ? ? ?

## 2021-09-30 NOTE — Transfer of Care (Signed)
Immediate Anesthesia Transfer of Care Note ? ?Patient: Clarence Alvarez ? ?Procedure(s) Performed: HARDWARE REMOVAL LEFT CLAVICLE (Left) ? ?Patient Location: PACU ? ?Anesthesia Type:General ? ?Level of Consciousness: drowsy and patient cooperative ? ?Airway & Oxygen Therapy: Patient Spontanous Breathing ? ?Post-op Assessment: Report given to RN and Post -op Vital signs reviewed and stable ? ?Post vital signs: Reviewed and stable ? ?Last Vitals:  ?Vitals Value Taken Time  ?BP 143/83 09/30/21 0844  ?Temp    ?Pulse 101 09/30/21 0845  ?Resp 14 09/30/21 0845  ?SpO2 93 % 09/30/21 0845  ?Vitals shown include unvalidated device data. ? ?Last Pain:  ?Vitals:  ? 09/30/21 0604  ?TempSrc:   ?PainSc: 0-No pain  ?   ? ?  ? ?Complications: No notable events documented. ?

## 2021-09-30 NOTE — Progress Notes (Signed)
Received call from lab that PCR swab was invalid.  Patient has already been discharged.  Dr. Doreatha Martin was made aware that swab results were invalid.  No new orders received.   ?

## 2021-09-30 NOTE — Anesthesia Preprocedure Evaluation (Addendum)
Anesthesia Evaluation  ?Patient identified by MRN, date of birth, ID band ?Patient awake ? ? ? ?Reviewed: ?Allergy & Precautions, NPO status , Patient's Chart, lab work & pertinent test results ? ?Airway ?Mallampati: III ? ?TM Distance: >3 FB ?Neck ROM: Full ? ? ? Dental ? ?(+) Edentulous Upper ?  ?Pulmonary ?asthma (uses albuterol once a week) , sleep apnea (pt denies, was tested in 2011 and was told very mild) , former smoker,  ?Quit smoking 1986 ?  ?Pulmonary exam normal ?breath sounds clear to auscultation ? ? ? ? ? ? Cardiovascular ?hypertension (157/95 in preop, per pt normally 140s/80s), Pt. on medications ?Normal cardiovascular exam ?Rhythm:Regular Rate:Normal ? ? ?  ?Neuro/Psych ?negative neurological ROS ? negative psych ROS  ? GI/Hepatic ?Neg liver ROS, hiatal hernia, GERD  Medicated and Controlled,  ?Endo/Other  ?diabetes (pre-diabetic)Obesity BMI 34 ? Renal/GU ?negative Renal ROS  ?negative genitourinary ?  ?Musculoskeletal ? ?(+) Arthritis , Osteoarthritis,   ? Abdominal ?(+) + obese,   ?Peds ?negative pediatric ROS ?(+)  Hematology ?negative hematology ROS ?(+)   ?Anesthesia Other Findings ?Painful hardware L clavicle  ? Reproductive/Obstetrics ?negative OB ROS ? ?  ? ? ? ? ? ? ? ? ? ? ? ? ? ?  ?  ? ? ? ? ? ? ? ?Anesthesia Physical ?Anesthesia Plan ? ?ASA: 2 ? ?Anesthesia Plan: General  ? ?Post-op Pain Management: Toradol IV (intra-op)* and Ofirmev IV (intra-op)*  ? ?Induction: Intravenous ? ?PONV Risk Score and Plan: 2 and Ondansetron, Dexamethasone, Midazolam and Treatment may vary due to age or medical condition ? ?Airway Management Planned: Oral ETT ? ?Additional Equipment: None ? ?Intra-op Plan:  ? ?Post-operative Plan: Extubation in OR ? ?Informed Consent: I have reviewed the patients History and Physical, chart, labs and discussed the procedure including the risks, benefits and alternatives for the proposed anesthesia with the patient or authorized  representative who has indicated his/her understanding and acceptance.  ? ? ? ?Dental advisory given ? ?Plan Discussed with: CRNA ? ?Anesthesia Plan Comments:   ? ? ? ? ? ? ?Anesthesia Quick Evaluation ? ?

## 2021-09-30 NOTE — Anesthesia Postprocedure Evaluation (Signed)
Anesthesia Post Note ? ?Patient: Clarence Alvarez ? ?Procedure(s) Performed: HARDWARE REMOVAL LEFT CLAVICLE (Left) ? ?  ? ?Patient location during evaluation: PACU ?Anesthesia Type: General ?Level of consciousness: awake and alert, oriented and patient cooperative ?Pain management: pain level controlled ?Vital Signs Assessment: post-procedure vital signs reviewed and stable ?Respiratory status: spontaneous breathing, nonlabored ventilation and respiratory function stable ?Cardiovascular status: blood pressure returned to baseline and stable ?Postop Assessment: no apparent nausea or vomiting ?Anesthetic complications: no ? ? ?No notable events documented. ? ?Last Vitals:  ?Vitals:  ? 09/30/21 0909 09/30/21 0910  ?BP:  (!) 138/92  ?Pulse: 85   ?Resp: 14 13  ?Temp:  36.4 ?C  ?SpO2: 97% 97%  ?  ?Last Pain:  ?Vitals:  ? 09/30/21 0910  ?TempSrc:   ?PainSc: 0-No pain  ? ? ?  ?  ?  ?  ?  ?  ? ?Jarome Matin Clementina Mareno ? ? ? ? ?

## 2021-10-01 ENCOUNTER — Encounter (HOSPITAL_COMMUNITY): Payer: Self-pay | Admitting: Student

## 2021-10-31 ENCOUNTER — Other Ambulatory Visit: Payer: Self-pay | Admitting: Family Medicine

## 2021-12-04 HISTORY — PX: IR FACET JT NEURO DESTRUCT EA ADD C/T W/IMAG GUIDE: IMG5317

## 2021-12-16 ENCOUNTER — Telehealth: Payer: Self-pay | Admitting: Gastroenterology

## 2021-12-16 NOTE — Telephone Encounter (Signed)
Thank you Dr. Fuller Plan and Dr. Bryan Lemma,  Patient was scheduled for PV on 6/13 at 9:00 and Procedure on 6/26 at 3:00.

## 2021-12-16 NOTE — Telephone Encounter (Signed)
Dr. Bryan Lemma,  Dr. Fuller Plan is okay with me scheduling patient with you for his colonoscopy. Patient needs to have procedure before 6/30. I do see you have some openings. Are you okay with me scheduling patient with you?  Please advise.

## 2021-12-16 NOTE — Telephone Encounter (Signed)
Good Morning Dr. Fuller Plan,  Patient called stating that he needed to have a colonoscopy in June instead of September due to his insurance running out at the end of June. I Advised patient that I would have to get approval from you before I scheduled him You also do not have any openings before 6/30. Patient is seeking advice if there is anything he can do to get this procedure. Will you please advise on scheduling?  Thank you.

## 2021-12-16 NOTE — Telephone Encounter (Signed)
OK to perform his colonoscopy before Sept. Please be sure we accept his current insurance. If I do not have availability in June please check with Dr. Bryan Lemma to see if he can accommodate.

## 2021-12-22 ENCOUNTER — Ambulatory Visit (AMBULATORY_SURGERY_CENTER): Payer: Self-pay

## 2021-12-22 VITALS — Ht 71.0 in | Wt 251.0 lb

## 2021-12-22 DIAGNOSIS — Z800 Family history of malignant neoplasm of digestive organs: Secondary | ICD-10-CM

## 2021-12-22 DIAGNOSIS — Z86010 Personal history of colonic polyps: Secondary | ICD-10-CM

## 2021-12-22 MED ORDER — NA SULFATE-K SULFATE-MG SULF 17.5-3.13-1.6 GM/177ML PO SOLN: 1.0000 | Freq: Once | ORAL | 0 refills | Status: AC

## 2021-12-22 NOTE — Progress Notes (Signed)
No egg or soy allergy known to patient  No issues known to pt with past sedation with any surgeries or procedures Patient denies ever being told they had issues or difficulty with intubation  No FH of Malignant Hyperthermia Pt is not on diet pills Pt is not on home 02  Pt is not on blood thinners  Pt denies issues with constipation at this time; No A fib or A flutter Coupon to pt in PV today , Code to Pharmacy and  NO PA's for preps discussed with pt in PV today  Discussed with pt there will be an out-of-pocket cost for prep and that varies from $0 to 70 + dollars - pt verbalized understanding  Pt instructed to use [email or web address] or GoodRx for a price reduction on prep  Pt verified name, DOB, address and insurance during PV today.  Pt given instruction packet to read and not return.  Pt encouraged to call with questions or issues.  Pt has My chart, procedure instructions sent via My Chart

## 2021-12-24 ENCOUNTER — Encounter: Payer: Self-pay | Admitting: Family Medicine

## 2021-12-24 ENCOUNTER — Ambulatory Visit (INDEPENDENT_AMBULATORY_CARE_PROVIDER_SITE_OTHER): Payer: Commercial Managed Care - PPO | Admitting: Family Medicine

## 2021-12-24 VITALS — BP 120/72 | HR 65 | Temp 97.6°F | Ht 71.75 in | Wt 247.4 lb

## 2021-12-24 DIAGNOSIS — Z13220 Encounter for screening for lipoid disorders: Secondary | ICD-10-CM

## 2021-12-24 DIAGNOSIS — Z0000 Encounter for general adult medical examination without abnormal findings: Secondary | ICD-10-CM

## 2021-12-24 DIAGNOSIS — E119 Type 2 diabetes mellitus without complications: Secondary | ICD-10-CM

## 2021-12-24 DIAGNOSIS — S36039D Unspecified laceration of spleen, subsequent encounter: Secondary | ICD-10-CM

## 2021-12-24 DIAGNOSIS — Z125 Encounter for screening for malignant neoplasm of prostate: Secondary | ICD-10-CM

## 2021-12-24 DIAGNOSIS — Z23 Encounter for immunization: Secondary | ICD-10-CM

## 2021-12-24 DIAGNOSIS — Z79899 Other long term (current) drug therapy: Secondary | ICD-10-CM | POA: Diagnosis not present

## 2021-12-24 DIAGNOSIS — D708 Other neutropenia: Secondary | ICD-10-CM

## 2021-12-24 LAB — HEMOGLOBIN A1C: Hgb A1c MFr Bld: 6.6 % — ABNORMAL HIGH (ref 4.6–6.5)

## 2021-12-24 LAB — HEPATIC FUNCTION PANEL
ALT: 33 U/L (ref 0–53)
AST: 38 U/L — ABNORMAL HIGH (ref 0–37)
Albumin: 4.5 g/dL (ref 3.5–5.2)
Alkaline Phosphatase: 71 U/L (ref 39–117)
Bilirubin, Direct: 0.1 mg/dL (ref 0.0–0.3)
Total Bilirubin: 0.6 mg/dL (ref 0.2–1.2)
Total Protein: 7.3 g/dL (ref 6.0–8.3)

## 2021-12-24 LAB — CBC WITH DIFFERENTIAL/PLATELET
Basophils Absolute: 0 10*3/uL (ref 0.0–0.1)
Basophils Relative: 0.7 % (ref 0.0–3.0)
Eosinophils Absolute: 0.1 10*3/uL (ref 0.0–0.7)
Eosinophils Relative: 3.5 % (ref 0.0–5.0)
HCT: 43.7 % (ref 39.0–52.0)
Hemoglobin: 14.4 g/dL (ref 13.0–17.0)
Lymphocytes Relative: 63.2 % — ABNORMAL HIGH (ref 12.0–46.0)
Lymphs Abs: 1.6 10*3/uL (ref 0.7–4.0)
MCHC: 33 g/dL (ref 30.0–36.0)
MCV: 89.2 fl (ref 78.0–100.0)
Monocytes Absolute: 0.2 10*3/uL (ref 0.1–1.0)
Monocytes Relative: 9.2 % (ref 3.0–12.0)
Neutro Abs: 0.6 10*3/uL — ABNORMAL LOW (ref 1.4–7.7)
Neutrophils Relative %: 23.4 % — ABNORMAL LOW (ref 43.0–77.0)
Platelets: 173 10*3/uL (ref 150.0–400.0)
RBC: 4.9 Mil/uL (ref 4.22–5.81)
RDW: 13.9 % (ref 11.5–15.5)
WBC: 2.5 10*3/uL — ABNORMAL LOW (ref 4.0–10.5)

## 2021-12-24 LAB — LIPID PANEL
Cholesterol: 172 mg/dL (ref 0–200)
HDL: 34.4 mg/dL — ABNORMAL LOW (ref >39.00)
LDL Cholesterol: 121 mg/dL — ABNORMAL HIGH (ref 0–99)
NonHDL: 137.19
Total CHOL/HDL Ratio: 5
Triglycerides: 79 mg/dL (ref 0.0–149.0)
VLDL: 15.8 mg/dL (ref 0.0–40.0)

## 2021-12-24 LAB — BASIC METABOLIC PANEL
BUN: 20 mg/dL (ref 6–23)
CO2: 33 mEq/L — ABNORMAL HIGH (ref 19–32)
Calcium: 9.8 mg/dL (ref 8.4–10.5)
Chloride: 102 mEq/L (ref 96–112)
Creatinine, Ser: 1.18 mg/dL (ref 0.40–1.50)
GFR: 68.5 mL/min (ref >60.00)
Glucose, Bld: 110 mg/dL — ABNORMAL HIGH (ref 70–99)
Potassium: 4.8 mEq/L (ref 3.5–5.1)
Sodium: 141 mEq/L (ref 135–145)

## 2021-12-24 MED ORDER — CYCLOBENZAPRINE HCL 10 MG PO TABS: 5.0000 mg | ORAL_TABLET | Freq: Every evening | ORAL | 3 refills | Status: DC | PRN

## 2021-12-24 NOTE — Progress Notes (Addendum)
Frances Joynt T. Shawntina Diffee, MD, [place] at [facility] [place], [address]  Phone: [phone]  FAX: [phone]  Clarence Alvarez - 56 y.o. male  MRN [id number]  Date of Birth: Feb 28, 1966  Date: 12/24/2021  PCP: Owens Loffler, MD  Referral: Owens Loffler, MD  Chief Complaint  Patient presents with   Annual Exam   Patient Care Team: Owens Loffler, MD as PCP - General (Family Medicine) Subjective:   Clarence Alvarez is a 56 y.o. pleasant patient who presents with the following:  Preventative Health Maintenance Visit:  Health Maintenance Summary Reviewed and updated, unless pt declines services.  Tobacco History Reviewed. Alcohol: No concerns, no excessive use - none Exercise Habits: trying to do the best that he can, but more limited now. STD concerns: no risk or activity to increase risk Drug Use: None  Shingrix - will hold off  Has had all covid vaccines Colon - 3 year recall and had polyps - already scheduled  Prevnar-20  Still out of work and not back to full duty. Hardware removed in 2021/10/12 Has had two neck injections - 2 disks  Doing ok at home now.  Onychomycosis - Lamisil if liver normal  Strength - weaker, frustrating  Health Maintenance  Topic Date Due   FOOT EXAM  Never done   Zoster Vaccines- Shingrix (1 of 2) Never done   OPHTHALMOLOGY EXAM  07/25/2020   COVID-19 Vaccine (5 - Booster for Pfizer series) 06/17/2021   URINE MICROALBUMIN  01/22/2022   INFLUENZA VACCINE  02/09/2022   COLONOSCOPY (Pts 45-26yrs Insurance coverage will need to be confirmed)  04/05/2022   HEMOGLOBIN A1C  06/25/2022   TETANUS/TDAP  12/13/2027   Hepatitis C Screening  Completed   HIV Screening  Completed   HPV VACCINES  Aged Out   Immunization History  Administered Date(s) Administered   Influenza Whole 04/11/2012   Influenza,inj,Quad PF,6+ Mos 04/22/2021   Influenza-Unspecified 03/12/2016,  04/10/2017, 03/12/2018, 04/06/2019, 05/02/2020   PFIZER(Purple Top)SARS-COV-2 Vaccination 09/08/2019, 09/29/2019, 06/13/2020, 04/22/2021   PNEUMOCOCCAL CONJUGATE-20 12/24/2021   Pneumococcal Polysaccharide-23 10/02/2018   Td 05/12/2004   Tdap 12/12/2017   Patient Active Problem List   Diagnosis Date Noted   Diabetes mellitus type 2, diet-controlled ([place]) 05/16/2019    Priority: High   Moderate persistent asthma with allergic rhinitis without complication 69/62/9528    Priority: High   Spleen laceration 12/13/2020    Priority: Medium    Essential hypertension 03/20/2018    Priority: Medium    OSA (obstructive sleep apnea) 01/26/2013    Priority: Medium    Multiple rib fractures involving four or more ribs 01/25/2021   Traumatic complete tear of right rotator cuff 11/30/2018   Degeneration of intervertebral disc of cervical spine without disc herniation 02/29/2016    Past Medical History:  Diagnosis Date   Anterior displaced fracture of sternal end of left clavicle, initial encounter for closed fracture 12/12/2020   DDD (degenerative disc disease), cervical Oct 13, 2015   GERD (gastroesophageal reflux disease)    on meds   History of adenomatous polyp of colon    03-25-2014  tubular adenoma's /  hyperplastic   History of hiatal hernia    History of kidney stones 12-Oct-2016   passed on own   Hypertension    on meds   Moderate persistent asthma    followed by pcp-- uncomplicated   Multiple rib fractures, 12/2020 traumatic injury 01/25/2021   Seasonal allergies  Spleen laceration 2022    Past Surgical History:  Procedure Laterality Date   20 HOUR [place] STUDY N/A 05/04/2018   Procedure: [place] STUDY;  Surgeon: Jonathon Bellows, MD;  Location: [facility] ENDOSCOPY;  Service: Gastroenterology;  Laterality: N/A;   CERVICAL FUSION  2017   anterior   COLONOSCOPY  2015   ESOPHAGEAL MANOMETRY N/A 05/04/2018   Procedure: ESOPHAGEAL MANOMETRY (EM);  Surgeon: Jonathon Bellows, MD;  Location: [facility] ENDOSCOPY;   Service: Gastroenterology;  Laterality: N/A;   ESOPHAGOGASTRODUODENOSCOPY (EGD) WITH PROPOFOL N/A 02/24/2018   Procedure: ESOPHAGOGASTRODUODENOSCOPY (EGD) WITH PROPOFOL;  Surgeon: Jonathon Bellows, MD;  Location: [facility] ENDOSCOPY;  Service: Gastroenterology;  Laterality: N/A;   HARDWARE REMOVAL Left 09/30/2021   Procedure: HARDWARE REMOVAL LEFT CLAVICLE;  Surgeon: Shona Needles, MD;  Location: [place];  Service: Orthopedics;  Laterality: Left;   IR ANGIOGRAM FOLLOW UP STUDY  12/16/2020   IR ANGIOGRAM SELECTIVE EACH ADDITIONAL VESSEL  12/16/2020   IR ANGIOGRAM VISCERAL SELECTIVE  12/16/2020   IR EMBO ART  VEN HEMORR LYMPH EXTRAV  INC GUIDE ROADMAPPING  12/16/2020   IR FACET JT NEURO DESTRUCT EA ADD C/T W/IMAG GUIDE  12/04/2021   Dr.Wainer   IR RADIOLOGIST EVAL & MGMT  02/18/2021   IR US GUIDE VASC ACCESS RIGHT  12/16/2020   KNEE SURGERY Right 2015   ORIF CLAVICULAR FRACTURE Left 12/19/2020   Procedure: OPEN REDUCTION INTERNAL FIXATION (ORIF) CLAVICULAR FRACTURE;  Surgeon: Shona Needles, MD;  Location: [place];  Service: Orthopedics;  Laterality: Left;   RESECTION DISTAL CLAVICAL Right 12/06/2018   Procedure: RESECTION DISTAL CLAVICAL AND DEBRIDEMENT OF THE SHOULDER;  Surgeon: Elsie Saas, MD;  Location: [place];  Service: Orthopedics;  Laterality: Right;  SCALENE BLOCK   SHOULDER ARTHROSCOPY WITH ROTATOR CUFF REPAIR AND SUBACROMIAL DECOMPRESSION Left 2012   Dr.Wainer $RemoveBe'@[email or web address]'[id number]$  = Labrum tear debridement   SHOULDER ARTHROSCOPY WITH ROTATOR CUFF REPAIR AND SUBACROMIAL DECOMPRESSION Right 12/06/2018   Procedure: SHOULDER ARTHROSCOPY WITH ROTATOR CUFF REPAIR AND SUBACROMIAL DECOMPRESSION;  Surgeon: Elsie Saas, MD;  Location: [place];  Service: Orthopedics;  Laterality: Right;  SCALENE BLOCK    Family History  Problem Relation Age of Onset   Diabetes Mother    Hepatitis C Mother    Colon polyps Father 76   Prostate cancer Father    Colon cancer Father 48    Colon polyps Sister    Mental illness Sister    Colon polyps Sister    Mental illness Brother    Pancreatic cancer Paternal Aunt    Colon polyps Maternal Grandmother 43       reoccurance at age 22   Diabetes Maternal Grandmother    Colon cancer Maternal Grandmother        dx age 56 - recurance at age 80   Stomach cancer Maternal Grandfather    Diabetes Paternal Grandmother    Mental illness Daughter    COPD Other        Aunt   Diabetes Other        siblings   Esophageal cancer Neg Hx    Rectal cancer Neg Hx     Social History   Social History Narrative   Not on file    Past Medical History, Surgical History, Social History, Family History, Problem List, Medications, and Allergies have been reviewed and updated if relevant.  Review of Systems: Pertinent positives are listed above.  Otherwise, a full 14 point review of systems has been done in  full and it is negative except where it is noted positive.  Objective:   BP 120/72   Pulse 65   Temp 97.6 F (36.4 C) (Oral)   Ht 5' 11.75" (1.822 m)   Wt 247 lb 6 oz (112.2 kg)   SpO2 96%   BMI 33.78 kg/m  Ideal Body Weight: Weight in (lb) to have BMI = 25: 182.7  Ideal Body Weight: Weight in (lb) to have BMI = 25: 182.7 No results found.    03/02/2021    2:09 PM 02/27/2020   11:39 AM 02/08/2019    8:22 AM 12/12/2017   10:32 AM  Depression screen PHQ 2/9  Decreased Interest 0 0 0 0  Down, Depressed, Hopeless 1 0 0 1  PHQ - 2 Score 1 0 0 1     GEN: well developed, well nourished, no acute distress Eyes: conjunctiva and lids normal, PERRLA, EOMI ENT: TM clear, nares clear, oral exam WNL Neck: supple, no lymphadenopathy, no thyromegaly, no JVD Pulm: clear to auscultation and percussion, respiratory effort normal CV: regular rate and rhythm, S1-S2, no murmur, rub or gallop, no bruits, peripheral pulses normal and symmetric, no cyanosis, clubbing, edema or varicosities GI: soft, non-tender; no hepatosplenomegaly, masses;  active bowel sounds all quadrants GU: deferred Lymph: no cervical, axillary or inguinal adenopathy MSK: gait normal, muscle tone and strength WNL, no joint swelling, effusions, discoloration, crepitus  SKIN: clear, good turgor, color WNL, no rashes, lesions, or ulcerations Neuro: normal mental status, normal strength, sensation, and motion Psych: alert; oriented to person, place and time, normally interactive and not anxious or depressed in appearance.  All labs reviewed with patient.   Assessment and Plan:     ICD-10-CM   1. Healthcare maintenance  Z00.00     2. Diabetes mellitus type 2, diet-controlled (HCC)  E11.9 Hemoglobin A1c    Pneumococcal conjugate vaccine 20-valent (Prevnar 20)    3. Screening PSA (prostate specific antigen)  Z12.5 PSA, Total with Reflex to PSA, Free    4. Screening, lipid  Z13.220 Lipid panel    5. Encounter for long-term (current) use of medications  S49.675 Basic metabolic panel    CBC with Differential/Platelet    Hepatic function panel    6. Need for vaccination against Streptococcus pneumoniae  Z23 Pneumococcal conjugate vaccine 20-valent (Prevnar 20)    7. Other neutropenia ([place])  D70.8 Ambulatory referral to Hematology / Oncology    8. Laceration of spleen, subsequent encounter  S36.039D Ambulatory referral to Hematology / Oncology     Doing ok, has a lot of frustrations with pain, some physical limitations compared to prior baseline.  Working on work retraining.  Check all labs  Prevnar-20 today Colonoscopy in the next couple of weeks.  Continue to be strong and active as much as possible.  Addendum: 12/25/21 10:28 AM   WBC is 2.5, with priors of 3.5. 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  I think that it makes sense for him to see hematology to evaluate WBC of  2.5 and decide if additional work-up needed and for decision-making.     Latest Ref Rng & Units 12/24/2021   10:48 AM 09/30/2021    5:54 AM 01/22/2021   11:40 AM  CBC EXTENDED  WBC 4.0 - 10.5 K/uL 2.5  3.5  3.5   RBC 4.22 -  5.81 Mil/uL 4.90  4.76  4.76   Hemoglobin 13.0 - 17.0 g/dL 14.4  14.1  13.9   HCT 39.0 - 52.0 % 43.7  42.3  41.5   Platelets 150.0 - 400.0 K/uL 173.0  187  195.0   NEUT# 1.4 - 7.7 K/uL 0.6   1.2   Lymph# 0.7 - 4.0 K/uL 1.6   1.7       Latest Ref Rng & Units 12/24/2021   10:48 AM 01/22/2021   11:40 AM 12/24/2020    4:26 PM  Hepatic Function  Total Protein 6.0 - 8.3 g/dL 7.3  7.4  7.5   Albumin 3.5 - 5.2 g/dL 4.5  4.4  4.2   AST 0 - 37 U/L 38  31  39   ALT 0 - 53 U/L 33  33  39   Alk Phosphatase 39 - 117 U/L 71  123  109   Total Bilirubin 0.2 - 1.2 mg/dL 0.6  0.6  1.1   Bilirubin, Direct 0.0 - 0.3 mg/dL 0.1  0.1  0.2     Health Maintenance Exam: The patient's preventative maintenance and recommended screening tests for an annual wellness exam were reviewed in full today. Brought up to date unless services declined.  Counselled on the importance of diet, exercise, and its role in overall health and mortality. The patient's FH and SH was reviewed, including their home life, tobacco status, and drug and alcohol status.  Follow-up in 1 year for physical exam or additional follow-up below.  Follow-up: No follow-ups on file. Or follow-up in 1 year if not noted.  Meds ordered this encounter  Medications   cyclobenzaprine (FLEXERIL) 10 MG tablet    Sig: Take 0.5-1 tablets (5-10 mg total) by mouth at bedtime as needed for muscle spasms.    Dispense:  30 tablet    Refill:  3   Medications Discontinued During This Encounter  Medication Reason   cyclobenzaprine (FLEXERIL) 10 MG tablet Duplicate   cyclobenzaprine (FLEXERIL) 10 MG tablet Reorder   Orders Placed This Encounter  Procedures   Pneumococcal conjugate vaccine 20-valent (Prevnar 20)   Basic metabolic  panel   CBC with Differential/Platelet   Hepatic function panel   Hemoglobin A1c   Lipid panel   PSA, Total with Reflex to PSA, Free   Ambulatory referral to Hematology / Oncology    Signed,  Maud Deed. Corynne Scibilia, MD   Allergies as of 12/24/2021       Reactions   Lactose Intolerance (gi) Other (See Comments)   Gas and bloating   Latex Other (See Comments)   Skin irritation dsicoloration/blisters        Medication List        Accurate as of December 24, 2021 11:59 PM. If you have any questions, ask your nurse or doctor.          albuterol 108 (90 Base) MCG/ACT inhaler Commonly known as: VENTOLIN HFA Inhale 2 puffs into the lungs every 6 (six) hours as needed for wheezing or shortness of breath.   albuterol (2.5 MG/3ML) 0.083% nebulizer solution Commonly known as: PROVENTIL Take 3 mLs (2.5 mg total) by nebulization every 6 (six) hours as needed for wheezing or shortness of breath.   amLODipine 5 MG tablet Commonly known as: NORVASC TAKE 1 TABLET BY MOUTH EVERY DAY   cetirizine 10 MG tablet Commonly known as: ZYRTEC Take 10 mg by mouth every morning.   cyclobenzaprine 10 MG tablet Commonly known as: FLEXERIL Take 0.5-1 tablets (  5-10 mg total) by mouth at bedtime as needed for muscle spasms.   Flovent HFA 110 MCG/ACT inhaler Generic drug: fluticasone TAKE 1 PUFF BY MOUTH TWICE A DAY What changed: See the new instructions.   fluticasone 50 MCG/ACT nasal spray Commonly known as: FLONASE Place 2 sprays into both nostrils daily as needed for allergies.   multivitamin with minerals Tabs tablet Take 1 tablet by mouth daily.   naproxen sodium 220 MG tablet Commonly known as: ALEVE Take 220 mg by mouth daily as needed (pain).   omeprazole 20 MG capsule Commonly known as: PRILOSEC TAKE 1 CAPSULE BY MOUTH EVERY DAY   sildenafil 100 MG tablet Commonly known as: VIAGRA TAKE 0.5-1 TABLETS BY MOUTH DAILY AS NEEDED FOR ERECTILE DYSFUNCTION.

## 2021-12-25 ENCOUNTER — Encounter: Payer: Self-pay | Admitting: Family Medicine

## 2021-12-25 MED ORDER — CICLOPIROX 8 % EX SOLN: Freq: Every day | CUTANEOUS | 5 refills | Status: DC

## 2021-12-25 NOTE — Addendum Note (Signed)
Addended by: Owens Loffler on: 12/25/2021 10:33 AM   Modules accepted: Orders

## 2021-12-25 NOTE — Addendum Note (Signed)
Addended by: Owens Loffler on: 12/25/2021 10:34 AM   Modules accepted: Orders

## 2021-12-28 ENCOUNTER — Telehealth: Payer: Self-pay | Admitting: Hematology and Oncology

## 2021-12-28 LAB — PSA, TOTAL WITH REFLEX TO PSA, FREE: PSA, Total: 0.6 ng/mL (ref <=4.0)

## 2021-12-28 NOTE — Telephone Encounter (Signed)
Scheduled appt per 6/16 referral. Pt is aware of appt date and time. Pt is aware to arrive 15 mins prior to appt time and to bring and updated insurance card. Pt is aware of appt location.   

## 2021-12-31 ENCOUNTER — Encounter: Payer: Self-pay | Admitting: Gastroenterology

## 2022-01-01 ENCOUNTER — Other Ambulatory Visit: Payer: Self-pay

## 2022-01-01 ENCOUNTER — Inpatient Hospital Stay: Payer: Commercial Managed Care - PPO | Attending: Hematology & Oncology

## 2022-01-01 ENCOUNTER — Inpatient Hospital Stay (HOSPITAL_BASED_OUTPATIENT_CLINIC_OR_DEPARTMENT_OTHER): Payer: Commercial Managed Care - PPO | Admitting: Hematology & Oncology

## 2022-01-01 ENCOUNTER — Encounter: Payer: Self-pay | Admitting: Hematology & Oncology

## 2022-01-01 VITALS — BP 121/90 | HR 85 | Temp 97.9°F | Resp 18 | Ht 71.5 in | Wt 250.1 lb

## 2022-01-01 DIAGNOSIS — D72819 Decreased white blood cell count, unspecified: Secondary | ICD-10-CM | POA: Diagnosis present

## 2022-01-01 DIAGNOSIS — Z79899 Other long term (current) drug therapy: Secondary | ICD-10-CM | POA: Insufficient documentation

## 2022-01-01 DIAGNOSIS — D709 Neutropenia, unspecified: Secondary | ICD-10-CM

## 2022-01-01 LAB — CBC WITH DIFFERENTIAL (CANCER CENTER ONLY)
Abs Immature Granulocytes: 0.01 10*3/uL (ref 0.00–0.07)
Basophils Absolute: 0 10*3/uL (ref 0.0–0.1)
Basophils Relative: 0 %
Eosinophils Absolute: 0.1 10*3/uL (ref 0.0–0.5)
Eosinophils Relative: 2 %
HCT: 43.9 % (ref 39.0–52.0)
Hemoglobin: 14.5 g/dL (ref 13.0–17.0)
Immature Granulocytes: 0 %
Lymphocytes Relative: 49 %
Lymphs Abs: 2.2 10*3/uL (ref 0.7–4.0)
MCH: 29.7 pg (ref 26.0–34.0)
MCHC: 33 g/dL (ref 30.0–36.0)
MCV: 89.8 fL (ref 80.0–100.0)
Monocytes Absolute: 0.4 10*3/uL (ref 0.1–1.0)
Monocytes Relative: 9 %
Neutro Abs: 1.9 10*3/uL (ref 1.7–7.7)
Neutrophils Relative %: 40 %
Platelet Count: 207 10*3/uL (ref 150–400)
RBC: 4.89 MIL/uL (ref 4.22–5.81)
RDW: 12.7 % (ref 11.5–15.5)
Smear Review: NORMAL
WBC Count: 4.6 10*3/uL (ref 4.0–10.5)
nRBC: 0 % (ref 0.0–0.2)

## 2022-01-01 LAB — SAVE SMEAR(SSMR), FOR PROVIDER SLIDE REVIEW

## 2022-01-04 ENCOUNTER — Encounter: Payer: Self-pay | Admitting: Gastroenterology

## 2022-01-04 ENCOUNTER — Ambulatory Visit (AMBULATORY_SURGERY_CENTER): Payer: Commercial Managed Care - PPO | Admitting: Gastroenterology

## 2022-01-04 VITALS — BP 114/75 | HR 70 | Temp 97.5°F | Resp 18 | Ht 71.0 in | Wt 251.0 lb

## 2022-01-04 DIAGNOSIS — Z09 Encounter for follow-up examination after completed treatment for conditions other than malignant neoplasm: Secondary | ICD-10-CM | POA: Diagnosis not present

## 2022-01-04 DIAGNOSIS — K5730 Diverticulosis of large intestine without perforation or abscess without bleeding: Secondary | ICD-10-CM

## 2022-01-04 DIAGNOSIS — D123 Benign neoplasm of transverse colon: Secondary | ICD-10-CM | POA: Diagnosis not present

## 2022-01-04 DIAGNOSIS — Z86010 Personal history of colonic polyps: Secondary | ICD-10-CM | POA: Diagnosis not present

## 2022-01-04 DIAGNOSIS — Z800 Family history of malignant neoplasm of digestive organs: Secondary | ICD-10-CM

## 2022-01-04 MED ORDER — SODIUM CHLORIDE 0.9 % IV SOLN: 500.0000 mL | Freq: Once | INTRAVENOUS | Status: DC

## 2022-01-04 NOTE — Progress Notes (Signed)
Called to room to assist during endoscopic procedure.  Patient ID and intended procedure confirmed with present staff. Received instructions for my participation in the procedure from the performing physician.  

## 2022-01-05 ENCOUNTER — Telehealth: Payer: Self-pay

## 2022-01-07 ENCOUNTER — Other Ambulatory Visit: Payer: Self-pay | Admitting: Family Medicine

## 2022-01-07 MED ORDER — AMLODIPINE BESYLATE 5 MG PO TABS: 5.0000 mg | ORAL_TABLET | Freq: Every day | ORAL | 3 refills | Status: DC

## 2022-01-18 ENCOUNTER — Encounter: Payer: Commercial Managed Care - PPO | Admitting: Hematology and Oncology

## 2022-01-18 ENCOUNTER — Other Ambulatory Visit: Payer: Commercial Managed Care - PPO

## 2022-01-21 ENCOUNTER — Encounter: Payer: Self-pay | Admitting: Gastroenterology

## 2022-02-16 ENCOUNTER — Other Ambulatory Visit: Payer: Self-pay | Admitting: Family Medicine

## 2022-02-25 ENCOUNTER — Encounter (HOSPITAL_BASED_OUTPATIENT_CLINIC_OR_DEPARTMENT_OTHER): Payer: Self-pay

## 2022-02-25 ENCOUNTER — Emergency Department (HOSPITAL_BASED_OUTPATIENT_CLINIC_OR_DEPARTMENT_OTHER)
Admission: EM | Admit: 2022-02-25 | Discharge: 2022-02-25 | Disposition: A | Discharge status: Home / Self Care | Payer: Commercial Managed Care - PPO | Attending: Emergency Medicine | Admitting: Emergency Medicine

## 2022-02-25 ENCOUNTER — Emergency Department (HOSPITAL_BASED_OUTPATIENT_CLINIC_OR_DEPARTMENT_OTHER): Payer: Commercial Managed Care - PPO | Admitting: Radiology

## 2022-02-25 ENCOUNTER — Other Ambulatory Visit: Payer: Self-pay

## 2022-02-25 DIAGNOSIS — X501XXA Overexertion from prolonged static or awkward postures, initial encounter: Secondary | ICD-10-CM | POA: Insufficient documentation

## 2022-02-25 DIAGNOSIS — Z91040 Latex allergy status: Secondary | ICD-10-CM | POA: Diagnosis not present

## 2022-02-25 DIAGNOSIS — S93402A Sprain of unspecified ligament of left ankle, initial encounter: Secondary | ICD-10-CM | POA: Diagnosis not present

## 2022-02-25 DIAGNOSIS — S99912A Unspecified injury of left ankle, initial encounter: Secondary | ICD-10-CM | POA: Diagnosis present

## 2022-02-25 NOTE — ED Triage Notes (Signed)
Patient here POV from Home.  Endorses Falling through [employer] Floor 7 Hours ago. Injured Anterior Left Ankle. No Head Injury.   NAD Noted during Triage. A&Ox4. GCS 15. BIB Wheelchair.

## 2022-02-25 NOTE — ED Notes (Signed)
Reviewed AVS/discharge instruction with patient. Time allotted for and all questions answered. Patient is agreeable for d/c and escorted to ed exit by staff.  

## 2022-02-25 NOTE — ED Provider Notes (Signed)
[facility] EMERGENCY DEPT Provider Note   CSN: [id number] Arrival date & time: 02/25/22  2101     History  Chief Complaint  Patient presents with   Ankle Injury    Clarence Alvarez is a 56 y.o. male.  He is here after an injury to his left ankle.  He said he was working in his house when his right foot broke through some rotten wood and he did a split twisting his left ankle.  Said it did not bother him too much initially but over the evening became more painful especially with weightbearing.  Pain is primarily on both medial and lateral ankle.  No foot pain no Achilles pain or heel pain.  No numbness.  No other injuries or complaints.  Not on blood thinners.  The history is provided by the patient.  Ankle Injury This is a new problem. The current episode started 3 to 5 hours ago. The problem occurs constantly. The problem has not changed since onset.The symptoms are aggravated by bending and walking. Nothing relieves the symptoms. He has tried nothing for the symptoms. The treatment provided no relief.       Home Medications Prior to Admission medications   Medication Sig Start Date End Date Taking? Authorizing Provider  albuterol (PROVENTIL) (2.5 MG/3ML) 0.083% nebulizer solution Take 3 mLs (2.5 mg total) by nebulization every 6 (six) hours as needed for wheezing or shortness of breath. 09/28/19   Ria Bush, MD  albuterol (VENTOLIN HFA) 108 (90 Base) MCG/ACT inhaler Inhale 2 puffs into the lungs every 6 (six) hours as needed for wheezing or shortness of breath.    [provider]  amLODipine (NORVASC) 5 MG tablet Take 1 tablet (5 mg total) by mouth daily. 01/07/22   Copland, Frederico Hamman, MD  cetirizine (ZYRTEC) 10 MG tablet Take 10 mg by mouth every morning.     [provider]  ciclopirox (PENLAC) 8 % solution Apply topically at bedtime. Apply over nail and surrounding skin. Apply daily over previous coat. After seven (7) days, may remove with  alcohol and continue cycle. 12/25/21   Copland, Frederico Hamman, MD  cyclobenzaprine (FLEXERIL) 10 MG tablet Take 0.5-1 tablets (5-10 mg total) by mouth at bedtime as needed for muscle spasms. 12/24/21   Copland, Frederico Hamman, MD  FLOVENT HFA 110 MCG/ACT inhaler TAKE 1 PUFF BY MOUTH TWICE A DAY Patient taking differently: Inhale 1 puff into the lungs 2 (two) times daily as needed (shortness of breath). 05/28/21   Copland, Frederico Hamman, MD  fluticasone (FLONASE) 50 MCG/ACT nasal spray Place 2 sprays into both nostrils daily as needed for allergies. 05/13/21   [provider]  Multiple Vitamin (MULTIVITAMIN WITH MINERALS) TABS tablet Take 1 tablet by mouth daily.    [provider]  naproxen sodium (ALEVE) 220 MG tablet Take 220 mg by mouth daily as needed (pain). Patient not taking: Reported on 01/04/2022    [provider]  omeprazole (PRILOSEC) 20 MG capsule TAKE 1 CAPSULE BY MOUTH EVERY DAY 02/16/22   Copland, Frederico Hamman, MD  sildenafil (VIAGRA) 100 MG tablet TAKE 0.5-1 TABLETS BY MOUTH DAILY AS NEEDED FOR ERECTILE DYSFUNCTION. Patient not taking: Reported on 01/04/2022 08/05/21   Owens Loffler, MD  albuterol (PROVENTIL,VENTOLIN) 90 MCG/ACT inhaler Inhale 2 puffs into the lungs every 6 (six) hours as needed for wheezing. 02/15/11 11/30/18  Katherina Mires, MD      Allergies    Lactose intolerance (gi) and Latex    Review of Systems   Review  of Systems  Skin:  Negative for wound.  Neurological:  Negative for weakness and numbness.    Physical Exam Updated Vital Signs BP (!) 153/96 (BP Location: Right Arm)   Pulse 80   Temp 98.2 F (36.8 C) (Temporal)   Resp 20   Ht '5\' 11"'$  (1.803 m)   Wt 113.9 kg   SpO2 100%   BMI 35.02 kg/m  Physical Exam Vitals and nursing note reviewed.  Constitutional:      Appearance: He is well-developed.  HENT:     Head: Normocephalic and atraumatic.  Eyes:     Conjunctiva/sclera: Conjunctivae normal.  Pulmonary:     Effort: Pulmonary effort is normal.   Musculoskeletal:        General: Tenderness present.     Cervical back: Neck supple.     Comments: He has diffuse tenderness around the ankle although more over the ligament area and not the malleoli.  No fifth metatarsal pain or calcaneal pain.  Achilles intact nontender.  Distal foot nontender with normal motor and sensation.  Skin:    General: Skin is warm and dry.  Neurological:     General: No focal deficit present.     Mental Status: He is alert.     GCS: GCS eye subscore is 4. GCS verbal subscore is 5. GCS motor subscore is 6.     Sensory: No sensory deficit.     Motor: No weakness.     ED Results / Procedures / Treatments   Labs (all labs ordered are listed, but only abnormal results are displayed) Labs Reviewed - No data to display  EKG None  Radiology DG Ankle Complete Left  Result Date: 02/25/2022 CLINICAL DATA:  Status post trauma. EXAM: LEFT ANKLE COMPLETE - 3+ VIEW COMPARISON:  None Available. FINDINGS: There is no evidence of an acute fracture, dislocation, or joint effusion. Mild degenerative changes are seen along the dorsal aspect of the proximal to mid left foot. Mild anterior and lateral soft tissue swelling is seen. Hazy opacification of Kager's fat pad is noted on the lateral view. IMPRESSION: 1. Mild soft tissue swelling without an acute osseous abnormality. 2. Additional findings which may represent sequelae associated with Achilles tendinopathy. Correlation with physical examination is recommended. Electronically Signed   By: Virgina Norfolk M.D.   On: 02/25/2022 21:54    Procedures Procedures    Medications Ordered in ED Medications - No data to display  ED Course/ Medical Decision Making/ A&P                           Medical Decision Making Amount and/or Complexity of Data Reviewed Radiology: ordered.  Differential diagnosis includes fracture, dislocation, sprain, contusion, ligamentous injury, tendon injury.  X-rays ordered interpreted by me  as no acute fracture or dislocation.  Reviewed with patient.  Will place in lace up brace and crutches.  Recommended follow-up with PCP and return instructions discussed.  No indications for admission or further work-up at this time.         Final Clinical Impression(s) / ED Diagnoses Final diagnoses:  Sprain of left ankle, unspecified ligament, initial encounter    Rx / DC Orders ED Discharge Orders     None         Hayden Rasmussen, MD 02/26/22 [phone]

## 2022-02-25 NOTE — Discharge Instructions (Signed)
You are seen in the emergency department for left ankle injury after a fall.  Your x-ray did not show any obvious fracture or dislocation.  Please use the ankle brace for support and crutches as needed for weightbearing.  Tylenol and ibuprofen.  Ice and elevation.  Follow-up with your regular doctor and return to the emergency department if any worsening or concerning symptoms.

## 2022-08-06 ENCOUNTER — Telehealth: Payer: Self-pay

## 2022-08-06 ENCOUNTER — Ambulatory Visit (INDEPENDENT_AMBULATORY_CARE_PROVIDER_SITE_OTHER): Payer: 59 | Admitting: Family Medicine

## 2022-08-06 VITALS — BP 130/82 | HR 83 | Temp 98.1°F | Ht 71.75 in | Wt 255.2 lb

## 2022-08-06 DIAGNOSIS — J4541 Moderate persistent asthma with (acute) exacerbation: Secondary | ICD-10-CM

## 2022-08-06 DIAGNOSIS — J4540 Moderate persistent asthma, uncomplicated: Secondary | ICD-10-CM

## 2022-08-06 MED ORDER — AZITHROMYCIN 250 MG PO TABS: ORAL_TABLET | ORAL | 0 refills | Status: DC

## 2022-08-06 MED ORDER — ALBUTEROL SULFATE HFA 108 (90 BASE) MCG/ACT IN AERS: 2.0000 | INHALATION_SPRAY | Freq: Four times a day (QID) | RESPIRATORY_TRACT | 3 refills | Status: DC | PRN

## 2022-08-06 MED ORDER — PREDNISONE 20 MG PO TABS: ORAL_TABLET | ORAL | 0 refills | Status: DC

## 2022-08-06 MED ORDER — ALBUTEROL SULFATE (2.5 MG/3ML) 0.083% IN NEBU
2.5000 mg | INHALATION_SOLUTION | Freq: Four times a day (QID) | RESPIRATORY_TRACT | 1 refills | Status: DC | PRN
Start: 2022-08-06 — End: 2023-06-29

## 2022-08-06 NOTE — Progress Notes (Signed)
Patient ID: Clarence Alvarez, male    DOB: 01-May-1966, 57 y.o.   MRN: [id number]  This visit was conducted in person.  BP 130/82   Pulse 83   Temp 98.1 F (36.7 C) (Oral)   Ht 5' 11.75" (1.822 m)   Wt 255 lb 4 oz (115.8 kg)   SpO2 95%   BMI 34.86 kg/m    CC:  Chief Complaint  Patient presents with   Cough    With chest congestion/heaviness in chest >2 weeks. Started taking wife's prednisone 4 mg and Mucinex Negative Covid test on Wednesday   Shoulder Pain    Subjective:   HPI: Clarence Alvarez is a 57 y.o. male pt of Dr. Lillie Fragmin with history of moderate persistent ashtma, DM and HTN presenting on 08/06/2022 for Cough (With chest congestion/heaviness in chest >2 weeks. Started taking wife's prednisone 4 mg and Mucinex/Negative Covid test on Wednesday) and Shoulder Pain   Date of onset:  2 weeks Initial symptoms included  congestion, headache Symptoms progressed to chest congestion, cough, productive ( greenish). Chest heaviness.  Now with wheeze, chest tightness, SOB   No fever,  occ body aches, low back pain.   Sick contacts:  none COVID testing:   negative x 2 in last 3 days     She has tried to treat with  wife's 24 mg taper down prednisone ( started that 5 days ago) and mucinex  Using flovent ( started back last week off and) and albuterol prn ( has not used it)   Has history of chronic lung disease such as asthma . Non-smoker.       Relevant past medical, surgical, family and social history reviewed and updated as indicated. Interim medical history since our last visit reviewed. Allergies and medications reviewed and updated. Outpatient Medications Prior to Visit  Medication Sig Dispense Refill   amLODipine (NORVASC) 5 MG tablet Take 1 tablet (5 mg total) by mouth daily. 90 tablet 3   cetirizine (ZYRTEC) 10 MG tablet Take 10 mg by mouth every morning.      ciclopirox (PENLAC) 8 % solution Apply topically at bedtime. Apply over nail and surrounding skin.  Apply daily over previous coat. After seven (7) days, may remove with alcohol and continue cycle. 6.6 mL 5   cyclobenzaprine (FLEXERIL) 10 MG tablet Take 0.5-1 tablets (5-10 mg total) by mouth at bedtime as needed for muscle spasms. 30 tablet 3   FLOVENT HFA 110 MCG/ACT inhaler TAKE 1 PUFF BY MOUTH TWICE A DAY (Patient taking differently: Inhale 1 puff into the lungs 2 (two) times daily as needed (shortness of breath).) 12 g 5   fluticasone (FLONASE) 50 MCG/ACT nasal spray Place 2 sprays into both nostrils daily as needed for allergies.     Multiple Vitamin (MULTIVITAMIN WITH MINERALS) TABS tablet Take 1 tablet by mouth daily.     naproxen sodium (ALEVE) 220 MG tablet Take 220 mg by mouth daily as needed (pain). (Patient not taking: Reported on 01/04/2022)     omeprazole (PRILOSEC) 20 MG capsule TAKE 1 CAPSULE BY MOUTH EVERY DAY 30 capsule 11   sildenafil (VIAGRA) 100 MG tablet TAKE 0.5-1 TABLETS BY MOUTH DAILY AS NEEDED FOR ERECTILE DYSFUNCTION. (Patient not taking: Reported on 01/04/2022) 5 tablet 11   albuterol (PROVENTIL) (2.5 MG/3ML) 0.083% nebulizer solution Take 3 mLs (2.5 mg total) by nebulization every 6 (six) hours as needed for wheezing or shortness of breath. 150 mL 1   albuterol (VENTOLIN HFA) 108 (  90 Base) MCG/ACT inhaler Inhale 2 puffs into the lungs every 6 (six) hours as needed for wheezing or shortness of breath.     No facility-administered medications prior to visit.     Per HPI unless specifically indicated in ROS section below Review of Systems  Constitutional:  Positive for fatigue. Negative for fever.  HENT:  Positive for congestion. Negative for ear pain.   Eyes:  Negative for pain.  Respiratory:  Positive for cough, chest tightness and shortness of breath.   Cardiovascular:  Negative for chest pain, palpitations and leg swelling.  Gastrointestinal:  Negative for abdominal pain.  Genitourinary:  Negative for dysuria.  Musculoskeletal:  Negative for arthralgias.   Neurological:  Negative for syncope, light-headedness and headaches.  Psychiatric/Behavioral:  Negative for dysphoric mood.    Objective:  BP 130/82   Pulse 83   Temp 98.1 F (36.7 C) (Oral)   Ht 5' 11.75" (1.822 m)   Wt 255 lb 4 oz (115.8 kg)   SpO2 95%   BMI 34.86 kg/m   Wt Readings from Last 3 Encounters:  08/06/22 255 lb 4 oz (115.8 kg)  02/25/22 251 lb 1.7 oz (113.9 kg)  01/04/22 251 lb (113.9 kg)      Physical Exam Constitutional:      Appearance: He is well-developed.  HENT:     Head: Normocephalic.     Right Ear: Hearing normal.     Left Ear: Hearing normal.     Nose: Rhinorrhea present.  Neck:     Thyroid: No thyroid mass or thyromegaly.     Vascular: No carotid bruit.     Trachea: Trachea normal.  Cardiovascular:     Rate and Rhythm: Normal rate and regular rhythm.     Pulses: Normal pulses.     Heart sounds: Heart sounds not distant. No murmur heard.    No friction rub. No gallop.     Comments: No peripheral edema Pulmonary:     Effort: Pulmonary effort is normal. No respiratory distress.     Breath sounds: Wheezing present.     Comments: Scattered wheeze at end of exhalation Skin:    General: Skin is warm and dry.     Findings: No rash.  Psychiatric:        Speech: Speech normal.        Behavior: Behavior normal.        Thought Content: Thought content normal.       Results for orders placed or performed in visit on 01/01/22  Save Smear for Provider Slide Review  Result Value Ref Range   Smear Review SMEAR STAINED AND AVAILABLE FOR REVIEW   CBC with Differential (Cancer Center Only)  Result Value Ref Range   WBC Count 4.6 4.0 - 10.5 K/uL   RBC 4.89 4.22 - 5.81 MIL/uL   Hemoglobin 14.5 13.0 - 17.0 g/dL   HCT 43.9 39.0 - 52.0 %   MCV 89.8 80.0 - 100.0 fL   MCH 29.7 26.0 - 34.0 pg   MCHC 33.0 30.0 - 36.0 g/dL   RDW 12.7 11.5 - 15.5 %   Platelet Count 207 150 - 400 K/uL   nRBC 0.0 0.0 - 0.2 %   Neutrophils Relative % 40 %   Neutro Abs 1.9  1.7 - 7.7 K/uL   Lymphocytes Relative 49 %   Lymphs Abs 2.2 0.7 - 4.0 K/uL   Monocytes Relative 9 %   Monocytes Absolute 0.4 0.1 - 1.0 K/uL   Eosinophils Relative  2 %   Eosinophils Absolute 0.1 0.0 - 0.5 K/uL   Basophils Relative 0 %   Basophils Absolute 0.0 0.0 - 0.1 K/uL   WBC Morphology MORPHOLOGY UNREMARKABLE    RBC Morphology MORPHOLOGY UNREMARKABLE    Smear Review Normal platelet morphology    Immature Granulocytes 0 %   Abs Immature Granulocytes 0.01 0.00 - 0.07 K/uL    Assessment and Plan  Asthma exacerbation, non-allergic, moderate persistent  Moderate persistent asthma with allergic rhinitis without complication Assessment & Plan: Acute, most likely initial viral infection now with asthma flare.  Given greater than 10 days of symptoms concerning for bacterial superinfection. He will start prednisone taper as well as azithromycin.  He can use albuterol as needed for wheeze.  He will temporarily hold his Flovent but we did talk about having him use this seasonally as well as restarting it once the prednisone taper is completed.  Return and ER precautions provided   Other orders -     Azithromycin; 2 tab po x 1 day then 1 tab po daily  Dispense: 6 tablet; Refill: 0 -     predniSONE; 3 tabs by mouth daily x 3 days, then 2 tabs by mouth daily x 2 days then 1 tab by mouth daily x 2 days  Dispense: 15 tablet; Refill: 0 -     Albuterol Sulfate; Take 3 mLs (2.5 mg total) by nebulization every 6 (six) hours as needed for wheezing or shortness of breath.  Dispense: 150 mL; Refill: 1 -     Albuterol Sulfate HFA; Inhale 2 puffs into the lungs every 6 (six) hours as needed for wheezing or shortness of breath.  Dispense: 1 each; Refill: 3    Return if symptoms worsen or fail to improve.   Eliezer Lofts, MD

## 2022-08-06 NOTE — Telephone Encounter (Signed)
Pt already has appt with Dr Diona Browner 08/06/22 at 12 noon. Sending note to Dr Diona Browner and [place] pool.

## 2022-08-06 NOTE — Telephone Encounter (Signed)
[facility] Night - Client TELEPHONE ADVICE RECORD [facility] Patient Name: Clarence Alvarez Gender: Male DOB: [birth year] Age: 57 Y 71 M 14 D Return Phone Number: [phone] (Primary) Address: City/ State/ Zip: [place]  [address] Client [place] Night - Client Client Site [place] Provider Owens Loffler - MD Contact Type Call Who Is Calling Patient / Member / Family / Caregiver Call Type Triage / Clinical Relationship To Patient Self Return Phone Number [phone] (Primary) Chief Complaint Ear Fullness or Congestion Reason for Call Request to Schedule Office Appointment Initial Comment Caller says he has been bothered with chest congestion for a few weeks. Translation No Nurse Assessment Nurse: Lucky Cowboy, RN, Levada Dy Date/Time (Eastern Time): 08/06/2022 8:27:59 AM Confirm and document reason for call. If symptomatic, describe symptoms. ---Caller stated that he has had chest congestion for 2 weeks or so. He has cough, headache. He's on a prednisone taper, day 5/6. Does the patient have any new or worsening symptoms? ---Yes Will a triage be completed? ---Yes Related visit to physician within the last 2 weeks? ---Yes Does the PT have any chronic conditions? (i.e. diabetes, asthma, this includes High risk factors for pregnancy, etc.) ---Yes List chronic conditions. ---asthma, chronic pain, spleen injury, GERD, HTN, seasonal allergies Has not been taking his Flovent regularly Is this a behavioral health or substance abuse call? ---No Guidelines Guideline Title Affirmed Question Affirmed Notes Nurse Date/Time (Eastern Time) Asthma Attack [1] Continuous (nonstop) coughing AND [2] keeps from working or sleeping AND [3] not improved after 2 or 3 inhaler or nebulizer treatments given 20 minutes apart Lucky Cowboy, RN, Levada Dy 08/06/2022 8:32:24 AM PLEASE NOTE: All timestamps contained within  this report are represented as [place] Standard Time. CONFIDENTIALTY NOTICE: This fax transmission is intended only for the addressee. It contains information that is legally privileged, confidential or otherwise protected from use or disclosure. If you are not the intended recipient, you are strictly prohibited from reviewing, disclosing, copying using or disseminating any of this information or taking any action in reliance on or regarding this information. If you have received this fax in error, please notify us immediately by telephone so that we can arrange for its return to [place]. Phone: [phone], Toll-Free: [phone], Fax: [phone] Page: 2 of 2 Call Id: [id number] [place]. Time Eilene Ghazi Time) Disposition Final User 08/06/2022 8:41:09 AM See HCP within 4 Hours (or PCP triage) Yes Lucky Cowboy, RN, Levada Dy Final Disposition 08/06/2022 8:41:09 AM See HCP within 4 Hours (or PCP triage) Yes Dew, RN, Marin Shutter Disagree/Comply Comply Caller Understands Yes PreDisposition Call Doctor Care Advice Given Per Guideline SEE HCP (OR PCP TRIAGE) WITHIN 4 HOURS: * IF OFFICE WILL BE OPEN: You need to be seen within the next 3 or 4 hours. Call your doctor (or NP/PA) now or as soon as the office opens. ASTHMA ATTACK - TREATMENT - QUICK-RELIEF MEDICINE: * If you have not used your inhaler or nebulizer in the PAST 20 MINUTES, take 2 to 4 puffs of your quick-relief ALBUTEROL (SALBUTAMOL) INHALER right now. CALL BACK IF: * You become worse CARE ADVICE given per Asthma Attack (Adult) guideline. Referrals REFERRED TO PCP OFFIC

## 2022-08-06 NOTE — Assessment & Plan Note (Signed)
Acute, most likely initial viral infection now with asthma flare.  Given greater than 10 days of symptoms concerning for bacterial superinfection. He will start prednisone taper as well as azithromycin.  He can use albuterol as needed for wheeze.  He will temporarily hold his Flovent but we did talk about having him use this seasonally as well as restarting it once the prednisone taper is completed.  Return and ER precautions provided

## 2022-08-10 ENCOUNTER — Other Ambulatory Visit: Payer: Self-pay | Admitting: Family Medicine

## 2022-08-11 ENCOUNTER — Encounter: Payer: Self-pay | Admitting: Surgery

## 2022-08-12 ENCOUNTER — Other Ambulatory Visit: Payer: Self-pay | Admitting: Family Medicine

## 2022-08-12 NOTE — Telephone Encounter (Addendum)
Jinny Sanders, MD  to Clarence Alvarez, [facility]      08/12/22  2:03 PM Contact patient.  Denied refill but if I have more information I can determine if he needs a repeat course of prednisone or if we need to repeat it but change it to an extended course. Please call to get an update on his symptoms   I spoke with pt and pt did request refill on prednisone. Pt finished abx on 08/11/22 and pt will finish prednisone on 08/13/22. Pt said he is better but "not there yet."Pt said he still feels weak and feels like his body is rundown and if pt did not have prednisone he might not feel like being up. Prednisone helps pt to be up and moving around.Pt still has head and chest congestion with prod cough with yellow green phlegm.  Pt said he is still weak and feels run down.pt does not have fever. Pt has some wheezing and CP due to mucus in chest and when pt coughs lt lung hurts. Pt has not used albuterol due to pt still being on prednisone. [facility] [place]. Sending note to Dr Diona Browner and pt request cb after note reviewed by Dr Diona Browner. UC & ED precautions given and pt voiced understanding

## 2022-08-12 NOTE — Telephone Encounter (Signed)
I would suggest that he complete the prednisone course tommorow but...  Use albuterol as needed for cough , wheeze or shortness of breath and go ahead and restart the FLOVENT inhaler  now. If still after that, still with sob and wheeze.. call and I will consider repeat course prednisone.

## 2022-08-12 NOTE — Telephone Encounter (Signed)
Last office visit 08/06/22 with Dr. Diona Browner for Asthma exacerbation.  Last refilled 08/06/2022 for #15 with no refills by Dr. Diona Browner.  No future appointments.

## 2022-08-12 NOTE — Telephone Encounter (Signed)
Mr. Cherne notified as instructed by telephone.  Patient states understanding.  

## 2022-08-12 NOTE — Telephone Encounter (Signed)
Please call and triage patient for Dr. Diona Browner.  (Also see if patient requested the refill or did the pharmacy send is as an automatic refill request)

## 2022-08-19 ENCOUNTER — Telehealth: Payer: Self-pay | Admitting: Family Medicine

## 2022-08-19 ENCOUNTER — Encounter: Payer: Self-pay | Admitting: Surgery

## 2022-08-19 ENCOUNTER — Other Ambulatory Visit: Payer: Self-pay | Admitting: Family Medicine

## 2022-08-19 MED ORDER — PREDNISONE 20 MG PO TABS
ORAL_TABLET | ORAL | 0 refills | Status: DC
Start: 2022-08-19 — End: 2022-09-13

## 2022-08-19 NOTE — Telephone Encounter (Signed)
Patient was seen with Kahi Mohala on 08/06/22,and prescriber prednisone. He called in today stating that he is still experiencing fatigue,and chest congestion. He has already taken all of the medication,and would like to know if he can get another rx for prednisone?  [id number] [place] [phone] Fax: [phone]

## 2022-08-19 NOTE — Telephone Encounter (Signed)
Sent in refill of prednisone.  If symptoms or not improving significantly by the end of this course he needs to have an in person follow-up with myself or his PCP.

## 2022-08-20 ENCOUNTER — Encounter: Payer: Self-pay | Admitting: Surgery

## 2022-08-20 NOTE — Telephone Encounter (Signed)
Mr. Gausman notified as instructed by telephone.  Patient states understanding.

## 2022-08-24 ENCOUNTER — Other Ambulatory Visit: Payer: Self-pay | Admitting: Surgery

## 2022-08-24 DIAGNOSIS — Z87828 Personal history of other (healed) physical injury and trauma: Secondary | ICD-10-CM

## 2022-08-24 DIAGNOSIS — S35329A Unspecified injury of splenic vein, initial encounter: Secondary | ICD-10-CM

## 2022-08-31 ENCOUNTER — Other Ambulatory Visit: Payer: Self-pay | Admitting: Surgery

## 2022-08-31 ENCOUNTER — Ambulatory Visit
Admission: RE | Admit: 2022-08-31 | Discharge: 2022-08-31 | Disposition: A | Discharge status: Home / Self Care | Payer: No Typology Code available for payment source | Source: Ambulatory Visit | Attending: Surgery | Admitting: Surgery

## 2022-08-31 DIAGNOSIS — Z87828 Personal history of other (healed) physical injury and trauma: Secondary | ICD-10-CM

## 2022-08-31 MED ORDER — IOPAMIDOL (ISOVUE-300) INJECTION 61%
100.0000 mL | Freq: Once | INTRAVENOUS | Status: AC | PRN
  Administered 2022-08-31: 100 mL via INTRAVENOUS

## 2022-09-12 NOTE — Progress Notes (Unsigned)
    Clarence Shorb T. Benjamine Strout, MD, Clarence Alvarez, [id number]  Phone: [phone]  FAX: [phone]  Clarence Alvarez - 57 y.o. male  MRN [id number]  Date of Birth: 06/24/1966  Date: 09/13/2022  PCP: Clarence Loffler, MD  Referral: Clarence Loffler, MD  No chief complaint on file.  Subjective:   Clarence Alvarez is a 57 y.o. very pleasant male patient with There is no height or weight on file to calculate BMI. who presents with the following:  He is here to predominantly follow-up on 2 things.  He does have diabetes and needs follow-up and follow-up A1c on this.  He also recently was found to have some aortic atherosclerosis on CT of the abdomen and pelvis, and he is here with some questions about that, as well.  Diabetes Mellitus: Tolerating Medications: yes Compliance with diet: fair, There is no height or weight on file to calculate BMI. Exercise: minimal / intermittent Avg blood sugars at home: not checking Foot problems: none Hypoglycemia: none No nausea, vomitting, blurred vision, polyuria.  Lab Results  Component Value Date   HGBA1C 6.6 (H) 12/24/2021   HGBA1C 5.6 01/22/2021   HGBA1C 5.9 02/19/2020   Lab Results  Component Value Date   MICROALBUR <0.7 01/22/2021   LDLCALC 121 (H) 12/24/2021   CREATININE 1.18 12/24/2021    Wt Readings from Last 3 Encounters:  08/06/22 255 lb 4 oz (115.8 kg)  02/25/22 251 lb 1.7 oz (113.9 kg)  01/04/22 251 lb (113.9 kg)     Review of Systems is noted in the HPI, as appropriate  Objective:   There were no vitals taken for this visit.  GEN: No acute distress; alert,appropriate. PULM: Breathing comfortably in no respiratory distress PSYCH: Normally interactive.   Laboratory and Imaging Data:  Assessment and Plan:   ***

## 2022-09-13 ENCOUNTER — Encounter: Payer: Self-pay | Admitting: Family Medicine

## 2022-09-13 ENCOUNTER — Ambulatory Visit (INDEPENDENT_AMBULATORY_CARE_PROVIDER_SITE_OTHER): Payer: 59 | Admitting: Family Medicine

## 2022-09-13 VITALS — BP 120/68 | HR 92 | Temp 98.3°F | Ht 71.75 in | Wt 256.0 lb

## 2022-09-13 DIAGNOSIS — E782 Mixed hyperlipidemia: Secondary | ICD-10-CM

## 2022-09-13 DIAGNOSIS — I700 Atherosclerosis of aorta: Secondary | ICD-10-CM | POA: Diagnosis not present

## 2022-09-13 DIAGNOSIS — E119 Type 2 diabetes mellitus without complications: Secondary | ICD-10-CM

## 2022-09-13 LAB — POCT GLYCOSYLATED HEMOGLOBIN (HGB A1C): Hemoglobin A1C: 7.6 % — AB (ref 4.0–5.6)

## 2022-09-13 LAB — MICROALBUMIN / CREATININE URINE RATIO
Creatinine,U: 85.3 mg/dL
Microalb Creat Ratio: 0.8 mg/g (ref 0.0–30.0)
Microalb, Ur: <0.7 mg/dL (ref 0.0–1.9)

## 2022-09-13 MED ORDER — METFORMIN HCL ER 500 MG PO TB24: 1000.0000 mg | ORAL_TABLET | Freq: Every day | ORAL | 1 refills | Status: DC

## 2022-09-13 MED ORDER — ROSUVASTATIN CALCIUM 20 MG PO TABS: 20.0000 mg | ORAL_TABLET | Freq: Every day | ORAL | 3 refills | Status: DC

## 2022-09-17 ENCOUNTER — Telehealth: Payer: Self-pay | Admitting: Family Medicine

## 2022-09-17 DIAGNOSIS — E119 Type 2 diabetes mellitus without complications: Secondary | ICD-10-CM

## 2022-09-17 MED ORDER — ACCU-CHEK SOFTCLIX LANCET DEV KIT: PACK | 0 refills | Status: AC

## 2022-09-17 MED ORDER — ACCU-CHEK SOFTCLIX LANCETS MISC: 3 refills | Status: AC

## 2022-09-17 MED ORDER — ACCU-CHEK GUIDE VI STRP: ORAL_STRIP | 3 refills | Status: AC

## 2022-09-17 MED ORDER — ACCU-CHEK GUIDE W/DEVICE KIT: PACK | 0 refills | Status: AC

## 2022-09-17 NOTE — Telephone Encounter (Signed)
Left message for Clarence Alvarez that [facility].

## 2022-09-17 NOTE — Telephone Encounter (Signed)
Pt called stating during his last ov with [facility], he was diagnosed with Type 2 diabetes. Pt asked was he going to also get a monitor prescribed so he can track his blood sugar readings?

## 2022-09-20 ENCOUNTER — Other Ambulatory Visit: Payer: Self-pay | Admitting: Family Medicine

## 2022-09-20 NOTE — Telephone Encounter (Signed)
Last office visit 09/13/2022 for DM, Aortic Atherosclerosis and Mixed Hyperlipidemia. Last refilled 12/24/2021 for #30 with 3 refills.  Next Appt: 03/16/23 for DM.

## 2022-12-08 ENCOUNTER — Other Ambulatory Visit: Payer: Self-pay | Admitting: Family Medicine

## 2022-12-08 ENCOUNTER — Telehealth: Payer: Self-pay | Admitting: Family Medicine

## 2022-12-08 MED ORDER — SILDENAFIL CITRATE 100 MG PO TABS: ORAL_TABLET | ORAL | 5 refills | Status: DC

## 2022-12-08 NOTE — Telephone Encounter (Signed)
Refill sent as requested. 

## 2022-12-08 NOTE — Telephone Encounter (Signed)
Prescription Request  12/08/2022  LOV: 09/13/2022  What is the name of the medication or equipment? sildenafil (VIAGRA) 100 MG tablet   Have you contacted your pharmacy to request a refill? Yes   Which pharmacy would you like this sent to?  [id number] [place] [place] - [address] AT [facility] OF [address] [place] [address] Phone: [phone] Fax: [phone]     Patient notified that their request is being sent to the clinical staff for review and that they should receive a response within 2 business days.   Please advise at Mobile [phone] (mobile)  Pt called in requesting to increase the amount of RX

## 2022-12-14 ENCOUNTER — Other Ambulatory Visit: Payer: Self-pay | Admitting: Family Medicine

## 2022-12-20 ENCOUNTER — Other Ambulatory Visit: Payer: Self-pay | Admitting: Family Medicine

## 2022-12-20 NOTE — Telephone Encounter (Signed)
Flexeril  Last filled 09/20/22 #30 3 rf  Last office visit 09/13/22 for DM  Follow up 03/16/23

## 2023-01-15 ENCOUNTER — Other Ambulatory Visit: Payer: Self-pay | Admitting: Family Medicine

## 2023-02-15 ENCOUNTER — Encounter: Payer: Self-pay | Admitting: Family Medicine

## 2023-02-15 NOTE — Progress Notes (Unsigned)
T. , MD, CAQ Sports Medicine [facility] at [facility] [address] [place], [address]  Phone: [phone]  FAX: [phone]  Clarence Alvarez - 57 y.o. male  MRN [id number]  Date of Birth: 14-Mar-1966  Date: 02/16/2023  PCP: Hannah Beat, MD  Referral: Hannah Beat, MD  No chief complaint on file.  Patient Care Team: Hannah Beat, MD as PCP - General (Family Medicine) Subjective:   Clarence Alvarez is a 57 y.o. pleasant patient who presents with the following:  Preventative Health Maintenance Visit:  Health Maintenance Summary Reviewed and updated, unless pt declines services.  Tobacco History Reviewed. Alcohol: No concerns, no excessive use Exercise Habits: Some activity, rec at least 30 mins 5 times a week STD concerns: no risk or activity to increase risk Drug Use: None  Clarence Alvarez is a very well-known patient, known for many years.  He is here for his general health maintenance exam and 62-month follow-up of diabetes.  Diabetes Mellitus: Tolerating Medications: yes -currently on metformin 1000 mg a day Compliance with diet: fair, There is no height or weight on file to calculate BMI. Exercise: minimal / intermittent Avg blood sugars at home: not checking Foot problems: none Hypoglycemia: none No nausea, vomitting, blurred vision, polyuria.  Foot exam Shingrix Eye Exam Covid vaccine - booster  Check all labs  Lab Results  Component Value Date   HGBA1C 7.6 (A) 09/13/2022   HGBA1C 6.6 (H) 12/24/2021   HGBA1C 5.6 01/22/2021   Lab Results  Component Value Date   MICROALBUR <0.7 09/13/2022   LDLCALC 121 (H) 12/24/2021   CREATININE 1.18 12/24/2021    Wt Readings from Last 3 Encounters:  09/13/22 256 lb (116.1 kg)  08/06/22 255 lb 4 oz (115.8 kg)  02/25/22 251 lb 1.7 oz (113.9 kg)    HTN: Tolerating all medications without side effects Currently, he is on amlodipine 5 mg Stable and at goal No CP, no  sob. No HA.  BP Readings from Last 3 Encounters:  09/13/22 120/68  08/06/22 130/82  02/25/22 (!) 143/95    Basic Metabolic Panel:    Component Value Date/Time   NA 141 12/24/2021 1048   K 4.8 12/24/2021 1048   CL 102 12/24/2021 1048   CO2 33 (H) 12/24/2021 1048   BUN 20 12/24/2021 1048   CREATININE 1.18 12/24/2021 1048   CREATININE 1.28 01/17/2014 1732   GLUCOSE 110 (H) 12/24/2021 1048   CALCIUM 9.8 12/24/2021 1048     Health Maintenance  Topic Date Due   FOOT EXAM  Never done   Zoster Vaccines- Shingrix (1 of 2) Never done   OPHTHALMOLOGY EXAM  07/25/2020   COVID-19 Vaccine (5 - 2023-24 season) 03/12/2022   Diabetic kidney evaluation - eGFR measurement  12/25/2022   INFLUENZA VACCINE  02/10/2023   HEMOGLOBIN A1C  03/16/2023   Diabetic kidney evaluation - Urine ACR  09/13/2023   Colonoscopy  01/05/2027   DTaP/Tdap/Td (3 - Td or Tdap) 12/13/2027   Hepatitis C Screening  Completed   HIV Screening  Completed   HPV VACCINES  Aged Out   Immunization History  Administered Date(s) Administered   Influenza Whole 04/11/2012   Influenza,inj,Quad PF,6+ Mos 04/22/2021   Influenza-Unspecified 03/12/2016, 04/10/2017, 03/12/2018, 04/06/2019, 05/02/2020   PFIZER(Purple Top)SARS-COV-2 Vaccination 09/08/2019, 09/29/2019, 06/13/2020, 04/22/2021   PNEUMOCOCCAL CONJUGATE-20 12/24/2021   Pneumococcal Polysaccharide-23 10/02/2018   Td 05/12/2004   Tdap 12/12/2017   Patient Active Problem List   Diagnosis Date Noted  Diabetes mellitus type 2, diet-controlled (HCC) 05/16/2019    Priority: High   Moderate persistent asthma with allergic rhinitis without complication 09/08/2006    Priority: High   Essential hypertension 03/20/2018    Priority: Medium    h/o prior Spleen laceration 12/13/2020    Priority: Low   Degeneration of intervertebral disc of cervical spine without disc herniation 02/29/2016    Priority: Low   Multiple rib fractures involving four or more ribs 01/25/2021    Traumatic complete tear of right rotator cuff 11/30/2018    Past Medical History:  Diagnosis Date   Allergy    Anterior displaced fracture of sternal end of left clavicle, initial encounter for closed fracture 12/12/2020   Blood transfusion without reported diagnosis    DDD (degenerative disc disease), cervical 03-09-2016   GERD (gastroesophageal reflux disease)    on meds   History of adenomatous polyp of colon    03-25-2014  tubular adenoma's /  hyperplastic   History of hiatal hernia    History of kidney stones 03-09-2017   passed on own   Hypertension    on meds   Moderate persistent asthma    followed by pcp-- uncomplicated   Multiple rib fractures, 12/2020 traumatic injury 01/25/2021   Seasonal allergies    Spleen laceration 03-09-21    Past Surgical History:  Procedure Laterality Date   24 HOUR PH STUDY N/A 05/04/2018   Procedure: 24 HOUR PH STUDY;  Surgeon: Wyline Mood, MD;  Location: [facility] ENDOSCOPY;  Service: Gastroenterology;  Laterality: N/A;   CERVICAL FUSION  March 09, 2016   anterior   COLONOSCOPY  2015   ESOPHAGEAL MANOMETRY N/A 05/04/2018   Procedure: ESOPHAGEAL MANOMETRY (EM);  Surgeon: Wyline Mood, MD;  Location: [facility] ENDOSCOPY;  Service: Gastroenterology;  Laterality: N/A;   ESOPHAGOGASTRODUODENOSCOPY (EGD) WITH PROPOFOL N/A 02/24/2018   Procedure: ESOPHAGOGASTRODUODENOSCOPY (EGD) WITH PROPOFOL;  Surgeon: Wyline Mood, MD;  Location: [facility] ENDOSCOPY;  Service: Gastroenterology;  Laterality: N/A;   HARDWARE REMOVAL Left 09/30/2021   Procedure: HARDWARE REMOVAL LEFT CLAVICLE;  Surgeon: Roby Lofts, MD;  Location: [facility] OR;  Service: Orthopedics;  Laterality: Left;   IR ANGIOGRAM FOLLOW UP STUDY  12/16/2020   IR ANGIOGRAM SELECTIVE EACH ADDITIONAL VESSEL  12/16/2020   IR ANGIOGRAM VISCERAL SELECTIVE  12/16/2020   IR EMBO ART  VEN HEMORR LYMPH EXTRAV  INC GUIDE ROADMAPPING  12/16/2020   IR FACET JT NEURO DESTRUCT EA ADD C/T W/IMAG GUIDE  12/04/2021   Dr.Wainer   IR RADIOLOGIST EVAL &  MGMT  02/18/2021   IR US GUIDE VASC ACCESS RIGHT  12/16/2020   KNEE SURGERY Right 2015   ORIF CLAVICULAR FRACTURE Left 12/19/2020   Procedure: OPEN REDUCTION INTERNAL FIXATION (ORIF) CLAVICULAR FRACTURE;  Surgeon: Roby Lofts, MD;  Location: [facility] OR;  Service: Orthopedics;  Laterality: Left;   RESECTION DISTAL CLAVICAL Right 12/06/2018   Procedure: RESECTION DISTAL CLAVICAL AND DEBRIDEMENT OF THE SHOULDER;  Surgeon: Salvatore Marvel, MD;  Location: [facility];  Service: Orthopedics;  Laterality: Right;  SCALENE BLOCK   SHOULDER ARTHROSCOPY WITH ROTATOR CUFF REPAIR AND SUBACROMIAL DECOMPRESSION Left 03-10-2011   Dr.Wainer @[facility]  = Labrum tear debridement   SHOULDER ARTHROSCOPY WITH ROTATOR CUFF REPAIR AND SUBACROMIAL DECOMPRESSION Right 12/06/2018   Procedure: SHOULDER ARTHROSCOPY WITH ROTATOR CUFF REPAIR AND SUBACROMIAL DECOMPRESSION;  Surgeon: Salvatore Marvel, MD;  Location: [facility];  Service: Orthopedics;  Laterality: Right;  SCALENE BLOCK    Family History  Problem Relation Age of Onset  Diabetes Mother    Hepatitis C Mother    Colon polyps Father 56   Prostate cancer Father    Colon cancer Father 78   Colon polyps Sister    Mental illness Sister    Colon polyps Sister    Mental illness Brother    Pancreatic cancer Paternal Aunt    Colon polyps Maternal Grandmother 30       reoccurance at age 45   Diabetes Maternal Grandmother    Colon cancer Maternal Grandmother        dx age 38 - recurance at age 17   Stomach cancer Maternal Grandfather    Diabetes Paternal Grandmother    Mental illness Daughter    COPD Other        Aunt   Diabetes Other        siblings   Esophageal cancer Neg Hx    Rectal cancer Neg Hx     Social History   Social History Narrative   Not on file    Past Medical History, Surgical History, Social History, Family History, Problem List, Medications, and Allergies have been reviewed and updated if relevant.  Review of  Systems: Pertinent positives are listed above.  Otherwise, a full 14 point review of systems has been done in full and it is negative except where it is noted positive.  Objective:   There were no vitals taken for this visit. Ideal Body Weight:    Ideal Body Weight:   No results found.    08/06/2022   12:46 PM 03/02/2021    2:09 PM 02/27/2020   11:39 AM 02/08/2019    8:22 AM 12/12/2017   10:32 AM  Depression screen PHQ 2/9  Decreased Interest 0 0 0 0 0  Down, Depressed, Hopeless 0 1 0 0 1  PHQ - 2 Score 0 1 0 0 1     GEN: well developed, well nourished, no acute distress Eyes: conjunctiva and lids normal, PERRLA, EOMI ENT: TM clear, nares clear, oral exam WNL Neck: supple, no lymphadenopathy, no thyromegaly, no JVD Pulm: clear to auscultation and percussion, respiratory effort normal CV: regular rate and rhythm, S1-S2, no murmur, rub or gallop, no bruits, peripheral pulses normal and symmetric, no cyanosis, clubbing, edema or varicosities GI: soft, non-tender; no hepatosplenomegaly, masses; active bowel sounds all quadrants GU: deferred Lymph: no cervical, axillary or inguinal adenopathy MSK: gait normal, muscle tone and strength WNL, no joint swelling, effusions, discoloration, crepitus  SKIN: clear, good turgor, color WNL, no rashes, lesions, or ulcerations Neuro: normal mental status, normal strength, sensation, and motion Psych: alert; oriented to person, place and time, normally interactive and not anxious or depressed in appearance.  All labs reviewed with patient. Results for orders placed or performed in visit on 09/13/22  Microalbumin / creatinine urine ratio  Result Value Ref Range   Microalb, Ur <0.7 0.0 - 1.9 mg/dL   Creatinine,U 29.5 mg/dL   Microalb Creat Ratio 0.8 0.0 - 30.0 mg/g  POCT glycosylated hemoglobin (Hb A1C)  Result Value Ref Range   Hemoglobin A1C 7.6 (A) 4.0 - 5.6 %   HbA1c POC (<> result, manual entry)     HbA1c, POC (prediabetic range)      HbA1c, POC (controlled diabetic range)      Assessment and Plan:     ICD-10-CM   1. Healthcare maintenance  Z00.00       Health Maintenance Exam: The patient's preventative maintenance and recommended screening tests for an annual wellness exam  were reviewed in full today. Brought up to date unless services declined.  Counselled on the importance of diet, exercise, and its role in overall health and mortality. The patient's FH and SH was reviewed, including their home life, tobacco status, and drug and alcohol status.  Follow-up in 1 year for physical exam or additional follow-up below.  Disposition: No follow-ups on file.  No orders of the defined types were placed in this encounter.  There are no discontinued medications. No orders of the defined types were placed in this encounter.   Signed,  Elpidio Galea. , MD   Allergies as of 02/16/2023       Reactions   Lactose Intolerance (gi) Other (See Comments)   Gas and bloating   Latex Dermatitis   Skin dsicoloration/blisters        Medication List        Accurate as of February 15, 2023 12:28 PM. If you have any questions, ask your nurse or doctor.          Accu-Chek Guide test strip Generic drug: glucose blood Use to check blood sugar daily   Accu-Chek Guide w/Device Kit Use to check blood sugar daily   Accu-Chek Softclix Lancet Dev Kit Use to check blood sugar daily   Accu-Chek Softclix Lancets lancets Use to check blood sugar daily   albuterol (2.5 MG/3ML) 0.083% nebulizer solution Commonly known as: PROVENTIL Take 3 mLs (2.5 mg total) by nebulization every 6 (six) hours as needed for wheezing or shortness of breath.   albuterol 108 (90 Base) MCG/ACT inhaler Commonly known as: VENTOLIN HFA TAKE 2 PUFFS BY MOUTH EVERY 6 HOURS AS NEEDED FOR WHEEZE OR SHORTNESS OF BREATH   amLODipine 5 MG tablet Commonly known as: NORVASC TAKE 1 TABLET (5 MG TOTAL) BY MOUTH DAILY.   cetirizine 10 MG  tablet Commonly known as: ZYRTEC Take 10 mg by mouth every morning.   ciclopirox 8 % solution Commonly known as: PENLAC Apply topically at bedtime. Apply over nail and surrounding skin. Apply daily over previous coat. After seven (7) days, may remove with alcohol and continue cycle.   cyclobenzaprine 10 MG tablet Commonly known as: FLEXERIL TAKE 1/2 TO 1 TABLET (5-10 MG TOTAL) BY MOUTH EVERY DAY AT BEDTIME AS NEEDED FOR MUSCLE SPASM   Flovent HFA 110 MCG/ACT inhaler Generic drug: fluticasone TAKE 1 PUFF BY MOUTH TWICE A DAY   fluticasone 50 MCG/ACT nasal spray Commonly known as: FLONASE Place 2 sprays into both nostrils daily as needed for allergies.   metFORMIN 500 MG 24 hr tablet Commonly known as: GLUCOPHAGE-XR Take 2 tablets (1,000 mg total) by mouth daily with breakfast.   multivitamin with minerals Tabs tablet Take 1 tablet by mouth daily.   omeprazole 20 MG capsule Commonly known as: PRILOSEC TAKE 1 CAPSULE BY MOUTH EVERY DAY   rosuvastatin 20 MG tablet Commonly known as: Crestor Take 1 tablet (20 mg total) by mouth daily.   sildenafil 100 MG tablet Commonly known as: VIAGRA TAKE 1/2 - 1 TABLET BY MOUTH DAILY AS NEEDED FOR ERECTILE DYSFUNCTION

## 2023-02-16 ENCOUNTER — Ambulatory Visit (INDEPENDENT_AMBULATORY_CARE_PROVIDER_SITE_OTHER): Payer: 59 | Admitting: Family Medicine

## 2023-02-16 ENCOUNTER — Other Ambulatory Visit: Payer: Self-pay | Admitting: Family Medicine

## 2023-02-16 ENCOUNTER — Encounter: Payer: Self-pay | Admitting: Family Medicine

## 2023-02-16 VITALS — BP 110/70 | HR 82 | Temp 97.7°F | Ht 71.0 in | Wt 240.0 lb

## 2023-02-16 DIAGNOSIS — Z0000 Encounter for general adult medical examination without abnormal findings: Secondary | ICD-10-CM | POA: Diagnosis not present

## 2023-02-16 DIAGNOSIS — E785 Hyperlipidemia, unspecified: Secondary | ICD-10-CM

## 2023-02-16 DIAGNOSIS — J4540 Moderate persistent asthma, uncomplicated: Secondary | ICD-10-CM

## 2023-02-16 DIAGNOSIS — E119 Type 2 diabetes mellitus without complications: Secondary | ICD-10-CM

## 2023-02-16 DIAGNOSIS — Z7984 Long term (current) use of oral hypoglycemic drugs: Secondary | ICD-10-CM

## 2023-02-16 DIAGNOSIS — E1169 Type 2 diabetes mellitus with other specified complication: Secondary | ICD-10-CM

## 2023-02-16 DIAGNOSIS — Z79899 Other long term (current) drug therapy: Secondary | ICD-10-CM | POA: Diagnosis not present

## 2023-02-16 DIAGNOSIS — Z125 Encounter for screening for malignant neoplasm of prostate: Secondary | ICD-10-CM

## 2023-02-16 LAB — POCT GLYCOSYLATED HEMOGLOBIN (HGB A1C): Hemoglobin A1C: 6.3 % — AB (ref 4.0–5.6)

## 2023-02-16 LAB — CBC WITH DIFFERENTIAL/PLATELET
Basophils Absolute: 0 10*3/uL (ref 0.0–0.1)
Basophils Relative: 1 % (ref 0.0–3.0)
Eosinophils Absolute: 0.2 10*3/uL (ref 0.0–0.7)
Eosinophils Relative: 6.3 % — ABNORMAL HIGH (ref 0.0–5.0)
HCT: 42.6 % (ref 39.0–52.0)
Hemoglobin: 13.7 g/dL (ref 13.0–17.0)
Lymphocytes Relative: 60.7 % — ABNORMAL HIGH (ref 12.0–46.0)
Lymphs Abs: 1.6 10*3/uL (ref 0.7–4.0)
MCHC: 32.2 g/dL (ref 30.0–36.0)
MCV: 86.7 fl (ref 78.0–100.0)
Monocytes Absolute: 0.3 10*3/uL (ref 0.1–1.0)
Monocytes Relative: 11.2 % (ref 3.0–12.0)
Neutro Abs: 0.5 10*3/uL — ABNORMAL LOW (ref 1.4–7.7)
Neutrophils Relative %: 20.8 % — ABNORMAL LOW (ref 43.0–77.0)
Platelets: 193 10*3/uL (ref 150.0–400.0)
RBC: 4.92 Mil/uL (ref 4.22–5.81)
RDW: 14.9 % (ref 11.5–15.5)
WBC: 2.6 10*3/uL — ABNORMAL LOW (ref 4.0–10.5)

## 2023-02-16 LAB — BASIC METABOLIC PANEL
BUN: 13 mg/dL (ref 6–23)
CO2: 31 mEq/L (ref 19–32)
Calcium: 9.6 mg/dL (ref 8.4–10.5)
Chloride: 101 mEq/L (ref 96–112)
Creatinine, Ser: 1.14 mg/dL (ref 0.40–1.50)
GFR: 70.82 mL/min (ref >60.00)
Glucose, Bld: 70 mg/dL (ref 70–99)
Potassium: 4.2 mEq/L (ref 3.5–5.1)
Sodium: 137 mEq/L (ref 135–145)

## 2023-02-16 LAB — LIPID PANEL
Cholesterol: 166 mg/dL (ref 0–200)
HDL: 35 mg/dL — ABNORMAL LOW (ref >39.00)
LDL Cholesterol: 103 mg/dL — ABNORMAL HIGH (ref 0–99)
NonHDL: 130.53
Total CHOL/HDL Ratio: 5
Triglycerides: 139 mg/dL (ref 0.0–149.0)
VLDL: 27.8 mg/dL (ref 0.0–40.0)

## 2023-02-16 LAB — HEPATIC FUNCTION PANEL
ALT: 23 U/L (ref 0–53)
AST: 31 U/L (ref 0–37)
Albumin: 4.3 g/dL (ref 3.5–5.2)
Alkaline Phosphatase: 77 U/L (ref 39–117)
Bilirubin, Direct: 0.1 mg/dL (ref 0.0–0.3)
Total Bilirubin: 0.5 mg/dL (ref 0.2–1.2)
Total Protein: 7.2 g/dL (ref 6.0–8.3)

## 2023-02-16 MED ORDER — FLUTICASONE PROPIONATE HFA 110 MCG/ACT IN AERO: 1.0000 | INHALATION_SPRAY | Freq: Two times a day (BID) | RESPIRATORY_TRACT | 5 refills | Status: DC

## 2023-02-16 MED ORDER — QVAR REDIHALER 40 MCG/ACT IN AERB: 2.0000 | INHALATION_SPRAY | Freq: Two times a day (BID) | RESPIRATORY_TRACT | 5 refills | Status: DC

## 2023-02-16 MED ORDER — FREESTYLE LIBRE 3 SENSOR MISC: 11 refills | Status: DC

## 2023-02-16 NOTE — Telephone Encounter (Signed)
Alternative Requested:NOT COVERED PLEASE ADVISE. ?

## 2023-02-16 NOTE — Telephone Encounter (Signed)
Mr. Thorell notified of substitution from Flovent to Qvar due to insurance coverage.  Patient states understanding.

## 2023-02-16 NOTE — Telephone Encounter (Signed)
Can you let Clarence Alvarez know that we had to do a substitution for his Flovent - not covered by his insurance, so I sent in generic Qvar.

## 2023-02-16 NOTE — Addendum Note (Signed)
Addended by: Hannah Beat on: 02/16/2023 11:55 AM   Modules accepted: Orders

## 2023-02-24 ENCOUNTER — Encounter (INDEPENDENT_AMBULATORY_CARE_PROVIDER_SITE_OTHER): Payer: Self-pay

## 2023-03-11 ENCOUNTER — Other Ambulatory Visit: Payer: Self-pay | Admitting: Family Medicine

## 2023-03-16 ENCOUNTER — Ambulatory Visit: Payer: 59 | Admitting: Family Medicine

## 2023-04-07 ENCOUNTER — Telehealth: Payer: Self-pay | Admitting: Family Medicine

## 2023-04-07 NOTE — Telephone Encounter (Signed)
Prescription Request  04/07/2023  LOV: 02/16/2023  What is the name of the medication or equipment? Continuous Glucose Sensor (FREESTYLE LIBRE 3 SENSOR) MISC   Have you contacted your pharmacy to request a refill? Yes   Which pharmacy would you like this sent to?  [id number] - [place], [place] - [address] AT [facility] [address] [place] [address] Phone: [phone] Fax: [phone]     Patient notified that their request is being sent to the clinical staff for review and that they should receive a response within 2 business days.   Please advise at [facility] [phone]

## 2023-04-07 NOTE — Telephone Encounter (Signed)
Reviewed patient chart looks like a year was given in August. Have called patient left message to call office so we can let him know he needs to call pharmacy for refill.

## 2023-04-10 ENCOUNTER — Other Ambulatory Visit: Payer: Self-pay | Admitting: Family Medicine

## 2023-04-10 DIAGNOSIS — I10 Essential (primary) hypertension: Secondary | ICD-10-CM

## 2023-05-20 LAB — HM DIABETES EYE EXAM

## 2023-05-22 NOTE — Progress Notes (Unsigned)
    Mostyn Varnell T. Daleyza Gadomski, MD, CAQ Sports Medicine [facility] at [facility] [address] [place], [address]  Phone: [phone]  FAX: [phone]  KENZY WIDRICK - 57 y.o. male  MRN [id number]  Date of Birth: 11/27/1965  Date: 05/23/2023  PCP: Hannah Beat, MD  Referral: Hannah Beat, MD  No chief complaint on file.  Subjective:   YVENS STOBER is a 57 y.o. very pleasant male patient with There is no height or weight on file to calculate BMI. who presents with the following:  He presents with some ongoing neck pain, shoulder pain, and bladder issues.  2017, history of cervical fusion History of nerve destruction, May 2023, neck 2022, ORIF clavicle fracture with subsequent hardware removal in 2023 2012, history of left-sided rotator cuff repair with subacromial decompression 2020, right-sided rotator cuff repair with subacromial decompression    Review of Systems is noted in the HPI, as appropriate  Objective:   There were no vitals taken for this visit.  GEN: No acute distress; alert,appropriate. PULM: Breathing comfortably in no respiratory distress PSYCH: Normally interactive.   Laboratory and Imaging Data:  Assessment and Plan:   ***

## 2023-05-23 ENCOUNTER — Ambulatory Visit (INDEPENDENT_AMBULATORY_CARE_PROVIDER_SITE_OTHER): Payer: 59 | Admitting: Family Medicine

## 2023-05-23 ENCOUNTER — Encounter: Payer: Self-pay | Admitting: Family Medicine

## 2023-05-23 ENCOUNTER — Other Ambulatory Visit: Payer: Self-pay | Admitting: Family Medicine

## 2023-05-23 VITALS — BP 120/70 | HR 74 | Temp 98.0°F | Ht 71.0 in | Wt 241.5 lb

## 2023-05-23 DIAGNOSIS — E119 Type 2 diabetes mellitus without complications: Secondary | ICD-10-CM

## 2023-05-23 DIAGNOSIS — M542 Cervicalgia: Secondary | ICD-10-CM

## 2023-05-23 DIAGNOSIS — M25512 Pain in left shoulder: Secondary | ICD-10-CM | POA: Diagnosis not present

## 2023-05-23 DIAGNOSIS — G8929 Other chronic pain: Secondary | ICD-10-CM

## 2023-05-23 DIAGNOSIS — N401 Enlarged prostate with lower urinary tract symptoms: Secondary | ICD-10-CM | POA: Diagnosis not present

## 2023-05-23 DIAGNOSIS — N138 Other obstructive and reflux uropathy: Secondary | ICD-10-CM

## 2023-05-23 DIAGNOSIS — Z7984 Long term (current) use of oral hypoglycemic drugs: Secondary | ICD-10-CM

## 2023-05-23 MED ORDER — TAMSULOSIN HCL 0.4 MG PO CAPS: 0.4000 mg | ORAL_CAPSULE | Freq: Every day | ORAL | 3 refills | Status: DC

## 2023-05-23 MED ORDER — FREESTYLE LIBRE 3 SENSOR MISC: 11 refills | Status: DC

## 2023-05-24 ENCOUNTER — Encounter: Payer: Self-pay | Admitting: Family Medicine

## 2023-05-24 MED ORDER — FREESTYLE LIBRE 3 PLUS SENSOR MISC: 11 refills | Status: DC

## 2023-05-24 NOTE — Addendum Note (Signed)
Addended by: Damita Lack on: 05/24/2023 09:15 AM   Modules accepted: Orders

## 2023-05-27 ENCOUNTER — Telehealth: Payer: Self-pay | Admitting: Family Medicine

## 2023-05-27 DIAGNOSIS — E119 Type 2 diabetes mellitus without complications: Secondary | ICD-10-CM

## 2023-05-27 MED ORDER — FREESTYLE LIBRE 3 PLUS SENSOR MISC: 11 refills | Status: DC

## 2023-05-27 NOTE — Telephone Encounter (Signed)
Rx for [employer] 3+ sent to [facility] as requested.  Pharmacy updated in patient's chart.

## 2023-05-27 NOTE — Telephone Encounter (Signed)
Patient called regarding Continuous Glucose Sensor (FREESTYLE LIBRE 3 PLUS SENSOR) MISC , states [facility], and is asking if this could be re sent to [facility] [address] in [place]. Patient would also like for all prescription refills to be sent to this pharmacy moving forward

## 2023-06-12 ENCOUNTER — Telehealth: Payer: 59 | Admitting: Family

## 2023-06-12 DIAGNOSIS — J019 Acute sinusitis, unspecified: Secondary | ICD-10-CM | POA: Diagnosis not present

## 2023-06-12 MED ORDER — AMOXICILLIN-POT CLAVULANATE 875-125 MG PO TABS: 1.0000 | ORAL_TABLET | Freq: Two times a day (BID) | ORAL | 0 refills | Status: DC

## 2023-06-12 NOTE — Progress Notes (Signed)
Virtual Visit Consent   [name], you are scheduled for a virtual visit with a [facility] provider today. Just as with appointments in the office, your consent must be obtained to participate. Your consent will be active for this visit and any virtual visit you may have with one of our providers in the next 365 days. If you have a MyChart account, a copy of this consent can be sent to you electronically.  As this is a virtual visit, video technology does not allow for your provider to perform a traditional examination. This may limit your provider's ability to fully assess your condition. If your provider identifies any concerns that need to be evaluated in person or the need to arrange testing (such as labs, EKG, etc.), we will make arrangements to do so. Although advances in technology are sophisticated, we cannot ensure that it will always work on either your end or our end. If the connection with a video visit is poor, the visit may have to be switched to a telephone visit. With either a video or telephone visit, we are not always able to ensure that we have a secure connection.  By engaging in this virtual visit, you consent to the provision of healthcare and authorize for your insurance to be billed (if applicable) for the services provided during this visit. Depending on your insurance coverage, you may receive a charge related to this service.  I need to obtain your verbal consent now. Are you willing to proceed with your visit today? Clarence Alvarez has provided verbal consent on 06/12/2023 for a virtual visit (video or telephone). Clarence Rodney, FNP  Date: 06/12/2023 8:35 AM  Virtual Visit via Video Note   I, Clarence Alvarez, connected with  Clarence Alvarez  ([id number], [birth year]) on 06/12/23 at  8:45 AM EST by a video-enabled telemedicine application and verified that I am speaking with the correct person using two identifiers.  Location: Patient: Virtual Visit Location Patient:  Home Provider: Virtual Visit Location Provider: Home Office   I discussed the limitations of evaluation and management by telemedicine and the availability of in person appointments. The patient expressed understanding and agreed to proceed.    History of Present Illness: Clarence Alvarez is a 57 y.o. who identifies as a male who was assigned male at birth, and is being seen today for sinus congestion, headache, sneezing that started two weeks ago.  HPI: Sinusitis This is a new problem. The current episode started 1 to 4 weeks ago. The problem has been gradually worsening since onset. There has been no fever. His pain is at a severity of 5/10. The pain is moderate. Associated symptoms include congestion, coughing, headaches, a hoarse voice, sinus pressure, sneezing and a sore throat. Pertinent negatives include no ear pain. Past treatments include oral decongestants, acetaminophen and lying down. The treatment provided mild relief.    Problems:  Patient Active Problem List   Diagnosis Date Noted   Multiple rib fractures involving four or more ribs 01/25/2021   h/o prior Spleen laceration 12/13/2020   Diabetes mellitus treated with oral medication (HCC) 05/16/2019   Traumatic complete tear of right rotator cuff 11/30/2018   Essential hypertension 03/20/2018   Degeneration of intervertebral disc of cervical spine without disc herniation 02/29/2016   Moderate persistent asthma with allergic rhinitis without complication 09/08/2006    Allergies:  Allergies  Allergen Reactions   Lactose Intolerance (Gi) Other (See Comments)    Gas and bloating   Latex  Dermatitis    Skin dsicoloration/blisters   Medications:  Current Outpatient Medications:    amoxicillin-clavulanate (AUGMENTIN) 875-125 MG tablet, Take 1 tablet by mouth 2 (two) times daily., Disp: 14 tablet, Rfl: 0   Accu-Chek Softclix Lancets lancets, Use to check blood sugar daily, Disp: 100 each, Rfl: 3   albuterol (PROVENTIL) (2.5  MG/3ML) 0.083% nebulizer solution, Take 3 mLs (2.5 mg total) by nebulization every 6 (six) hours as needed for wheezing or shortness of breath., Disp: 150 mL, Rfl: 1   albuterol (VENTOLIN HFA) 108 (90 Base) MCG/ACT inhaler, TAKE 2 PUFFS BY MOUTH EVERY 6 HOURS AS NEEDED FOR WHEEZE OR SHORTNESS OF BREATH, Disp: 8.5 each, Rfl: 0   amLODipine (NORVASC) 5 MG tablet, TAKE 1 TABLET (5 MG TOTAL) BY MOUTH DAILY., Disp: 90 tablet, Rfl: 3   beclomethasone (QVAR REDIHALER) 40 MCG/ACT inhaler, Inhale 2 puffs into the lungs 2 (two) times daily., Disp: 10.6 g, Rfl: 5   Blood Glucose Monitoring Suppl (ACCU-CHEK GUIDE) w/Device KIT, Use to check blood sugar daily, Disp: 1 kit, Rfl: 0   cetirizine (ZYRTEC) 10 MG tablet, Take 10 mg by mouth every morning. , Disp: , Rfl:    Continuous Glucose Sensor (FREESTYLE LIBRE 3 PLUS SENSOR) MISC, PLACE 1 SENSOR ON THE SKIN EVERY 14 DAYS. USE TO CHECK GLUCOSE CONTINUOUSLY, Disp: 2 each, Rfl: 11   cyclobenzaprine (FLEXERIL) 10 MG tablet, TAKE 1/2 TO 1 TABLET (5-10 MG TOTAL) BY MOUTH EVERY DAY AT BEDTIME AS NEEDED FOR MUSCLE SPASM, Disp: 90 tablet, Rfl: 1   glucose blood (ACCU-CHEK GUIDE) test strip, Use to check blood sugar daily, Disp: 100 each, Rfl: 3   Lancets Misc. (ACCU-CHEK SOFTCLIX LANCET DEV) KIT, Use to check blood sugar daily, Disp: 1 kit, Rfl: 0   Multiple Vitamin (MULTIVITAMIN WITH MINERALS) TABS tablet, Take 1 tablet by mouth daily., Disp: , Rfl:    omeprazole (PRILOSEC) 20 MG capsule, TAKE 1 CAPSULE BY MOUTH EVERY DAY, Disp: 30 capsule, Rfl: 6   sildenafil (VIAGRA) 100 MG tablet, TAKE 1/2 - 1 TABLET BY MOUTH DAILY AS NEEDED FOR ERECTILE DYSFUNCTION, Disp: 5 tablet, Rfl: 5   tamsulosin (FLOMAX) 0.4 MG CAPS capsule, Take 1 capsule (0.4 mg total) by mouth daily., Disp: 30 capsule, Rfl: 3  Observations/Objective: Patient is well-developed, well-nourished in no acute distress.  Resting comfortably  at home.  Head is normocephalic, atraumatic.  No labored breathing.   Speech is clear and coherent with logical content.  Patient is alert and oriented at baseline.  Sinus pain in maxillary  Congestion and hoarse voice    Assessment and Plan: 1. Acute sinusitis, recurrence not specified, unspecified location - amoxicillin-clavulanate (AUGMENTIN) 875-125 MG tablet; Take 1 tablet by mouth 2 (two) times daily.  Dispense: 14 tablet; Refill: 0  - Take meds as prescribed - Use a cool mist humidifier  -Use saline nose sprays frequently -Force fluids -For any cough or congestion  Use plain Mucinex- regular strength or max strength is fine -For fever or aces or pains- take tylenol or ibuprofen. -Throat lozenges if help -Follow up if symptoms worsen or do not improve   Follow Up Instructions: I discussed the assessment and treatment plan with the patient. The patient was provided an opportunity to ask questions and all were answered. The patient agreed with the plan and demonstrated an understanding of the instructions.  A copy of instructions were sent to the patient via MyChart unless otherwise noted below.     The patient was advised to  call back or seek an in-person evaluation if the symptoms worsen or if the condition fails to improve as anticipated.    Clarence Rodney, FNP

## 2023-06-13 ENCOUNTER — Telehealth: Payer: Self-pay | Admitting: Family Medicine

## 2023-06-13 MED ORDER — PREDNISONE 20 MG PO TABS: ORAL_TABLET | ORAL | 0 refills | Status: DC

## 2023-06-13 NOTE — Telephone Encounter (Signed)
Ok, done 

## 2023-06-13 NOTE — Telephone Encounter (Signed)
Clarence Alvarez notified by telephone that Dr. Patsy Lager sent in the Prednisone Rx to his pharmacy.

## 2023-06-13 NOTE — Telephone Encounter (Signed)
Patient called in and stated that he had a video visit yesterday and was prescribed some amoxicillin-clavulanate (AUGMENTIN) 875-125 MG tablet. He was wanting to know if Dr. Patsy Lager could send him in some Prednisone. He stated that it will help him getting over some of the other symptoms he has and boost his immune system due to his spleen. Please advise. Thank you!

## 2023-06-13 NOTE — Addendum Note (Signed)
Addended by: Hannah Beat on: 06/13/2023 10:58 AM   Modules accepted: Orders

## 2023-06-15 ENCOUNTER — Telehealth: Payer: Self-pay | Admitting: Family Medicine

## 2023-06-15 NOTE — Telephone Encounter (Signed)
Pt transferred to Clinical research associate by Karna Christmas, RN with Access Nurse.  Pt reports that he is diabetic but controls his BS with diet and exercise.  Was seen 06/12/23 for Acute sinusitis and prescribed Prednisone 20mg  2 tabs daily for 5 days, then 1 tab daily for 5 days.  Since starting the prednisone pt has experienced elevated BS.  Last night after dinner his highest reading from [place] sensor was 286.  This morning pt did finger stick while fasting that was 221.  Pt concerned about blood sugar and would like what he needs to do.  Please advise.

## 2023-06-15 NOTE — Telephone Encounter (Signed)
Clarence Alvarez notified as instructed by telephone.  Patient states understanding.  

## 2023-06-15 NOTE — Telephone Encounter (Signed)
FYI: This call has been transferred to Access Nurse. Once the result note has been entered staff can address the message at that time.  Patient called in with the following symptoms:  Red Word: elevated blood sugar , patient's blood sugar has been in the 200's, has been taking prednisone    Please advise at Mobile [phone] (mobile)  Message is routed to Provider Pool and [facility] Triage

## 2023-06-15 NOTE — Telephone Encounter (Signed)
Please call back  He doesn't need to do anything.  When he gets off of prednisone, his blood sugar will drop.  When diabetes take steroids, there blood sugar almost always goes up.  While it is high, it is not acutely dangerous, and he should be able to complete his prednisone prescription.

## 2023-06-20 ENCOUNTER — Encounter: Payer: Self-pay | Admitting: Family Medicine

## 2023-06-20 MED ORDER — DOXYCYCLINE HYCLATE 100 MG PO TABS: 100.0000 mg | ORAL_TABLET | Freq: Two times a day (BID) | ORAL | 0 refills | Status: DC

## 2023-06-20 MED ORDER — PREDNISONE 20 MG PO TABS: ORAL_TABLET | ORAL | 0 refills | Status: DC

## 2023-06-22 NOTE — Telephone Encounter (Signed)
Can you call  He is currently on antibiotics that cover pneumonia.  If he would like, he can come into the office for a recheck face-to-face.

## 2023-06-29 ENCOUNTER — Other Ambulatory Visit: Payer: Self-pay

## 2023-06-29 MED ORDER — ALBUTEROL SULFATE (2.5 MG/3ML) 0.083% IN NEBU: 2.5000 mg | INHALATION_SOLUTION | Freq: Four times a day (QID) | RESPIRATORY_TRACT | 1 refills | Status: DC | PRN

## 2023-06-29 NOTE — Telephone Encounter (Signed)
Copied from CRM [id number]. Topic: Clinical - Medication Refill >> Jun 29, 2023  8:11 AM Efraim Kaufmann C wrote: Most Recent Primary Care Visit:  Provider: Hannah Beat  Department: [facility]  Visit Type: OFFICE VISIT  Date: 05/23/2023  Medication: Flovent HFA (generic maintenance med inhaler)  Has the patient contacted their pharmacy? Yes (Agent: If no, request that the patient contact the pharmacy for the refill. If patient does not wish to contact the pharmacy document the reason why and proceed with request.) (Agent: If yes, when and what did the pharmacy advise?)  Is this the correct pharmacy for this prescription? Yes If no, delete pharmacy and type the correct one.  This is the patient's preferred pharmacy:  [id number] - [place], [place] - [address] AT [facility] OF [address] [place] [address] Phone: [phone] Fax: [phone]   Has the prescription been filled recently? Yes  Is the patient out of the medication? Yes  Has the patient been seen for an appointment in the last year OR does the patient have an upcoming appointment? Yes  Can we respond through MyChart? Yes  Agent: Please be advised that Rx refills may take up to 3 business days. We ask that you follow-up with your pharmacy. >> Jun 29, 2023  8:16 AM Melissa C wrote: Patient stated he used to be on this medication and it is needed for maintenance for his asthma

## 2023-06-30 ENCOUNTER — Telehealth: Payer: Self-pay | Admitting: Family Medicine

## 2023-06-30 NOTE — Telephone Encounter (Signed)
Dx code J45.40 provided to pharmacy.

## 2023-06-30 NOTE — Telephone Encounter (Signed)
Pharmacy is needing diagnosis code for medication  ,  patient is requesting to have this refilled. Please advise pharmacy.

## 2023-07-04 ENCOUNTER — Ambulatory Visit: Payer: Self-pay | Admitting: Family Medicine

## 2023-07-04 NOTE — Telephone Encounter (Signed)
Chief Complaint: sinus infection not improved after completing steroids and antibiotics  Symptoms: mild SOB, cough, chest tightness, headaches, sinus congestion, sinus pain, yellow and sometimes bloody mucus  Frequency: ongoing since Novemeber  Pertinent Negatives: Patient denies fevers Disposition: [] ED /[] Urgent Care (no appt availability in office) / [x] Appointment(In office/virtual)/ []  [facility]/ [] Home Care/ [] Refused Recommended Disposition /[] [facility]/ []  Follow-up with PCP Additional Notes: Pt c/o sinus infection and mild asthma flare up. Pt states he was placed on augmentin and prednisone 06/12/23. Completed those and was placed on doxycyline and prednisone extended on 06/20/23. Pt states he has not improved and feels like his asthma is flaring up. Pt states it does not feel severe and his SOB and chest tightness improve with his flovent and albuterol treatments. Pt agreeable to OV tomorrow morning. Pt verbalizes understanding of home care advice and concerning symptoms to call back for.  Copied from CRM [id number]. Topic: Clinical - Red Word Triage >> Jul 04, 2023  3:56 PM Deaijah H wrote: Red Word that prompted transfer to Nurse Triage: Finished antibiotics , symptoms for sinus infection returning. Mucus and bloody/tightness in chest/ headaches Reason for Disposition  [1] MILD asthma attack (e.g., no SOB at rest, mild SOB with walking, speaks normally in sentences, mild wheezing) AND [2] lasting > 24 hours on prescribed treatment  [1] Taking antibiotic > 72 hours (3 days) AND [2] sinus pain not improved  Answer Assessment - Initial Assessment Questions 1. ANTIBIOTIC: "What antibiotic are you taking?" "How many times a day?"     Augmentin, was prescribed on 12/1  for 7 days ; doxycycline started on 12/9 for 10 days.  2. ONSET: "When was the antibiotic started?"     Augmentin 06/12/23 and doxycycline 06/20/23.  3. PAIN: "How bad is the sinus pain?"   (Scale  1-10; mild, moderate or severe)   - MILD (1-3): doesn't interfere with normal activities    - MODERATE (4-7): interferes with normal activities (e.g., work or school) or awakens from sleep   - SEVERE (8-10): excruciating pain and patient unable to do any normal activities        4/10 sinus/head pain.  4. FEVER: "Do you have a fever?" If Yes, ask: "What is it, how was it measured, and when did it start?"      No fevers.  5. SYMPTOMS: "Are there any other symptoms you're concerned about?" If Yes, ask: "When did it start?"     Headaches, sinus congestion and pain, yellow mucus sometimes seeing bloody mucus, cough, tightness in chest (relieved by albuterol treatments), SOB. Pt states the symptoms have been ongoing since November.  Answer Assessment - Initial Assessment Questions 1. RESPIRATORY STATUS: "Describe your breathing?" (e.g., wheezing, shortness of breath, unable to speak, severe coughing)      SOB, wheezing, severe coughing spells.  2. ONSET: "When did this asthma attack begin?"      Pt states at the moment he does not feel like it is an asthma attack.  3. TRIGGER: "What do you think triggered this attack?" (e.g., URI, exposure to pollen or other allergen, tobacco smoke)      Pt states he think it may be related to his ongoing sinus infection he has been treated for.  4. PEAK EXPIRATORY FLOW RATE (PEFR): "Do you use a peak flow meter?" If Yes, ask: "What's the current peak flow? What's your personal best peak flow?"      Pt states he used to have one  but does not currently.  5. SEVERITY: "How bad is this attack?"    - MILD: No SOB at rest, mild SOB with walking, speaks normally in sentences, can lie down, no retractions, pulse < 100. (GREEN Zone: PEFR 80-100%)   - MODERATE: SOB at rest, SOB with minimal exertion and prefers to sit, cannot lie down flat, speaks in phrases, mild retractions, audible wheezing, pulse 100-120. (YELLOW Zone: PEFR 50-79%)    - SEVERE: Struggling for each  breath, speaks in single words, struggling to breathe, sitting hunched forward, retractions, usually loud wheezing, sometimes minimal wheezing because of decreased air movement, pulse > 120. (RED Zone: PEFR < 50%).      Mild.  6. ASTHMA MEDICINES:  "What treatments have you tried?"    - INHALED QUICK RELIEF (RESCUE): "What is your inhaled quick-relief medicine?" (e.g., albuterol, salbutamol) "Do you use an inhaler or a nebulizer?" "How frequently have you been using this medicine?"   - CONTROLLER (LONG-TERM-CONTROL): "Do you take an inhaled steroid? (e.g., Asmanex, Flovent, Pulmicort, Qvar)     Albuterol, flovent; pt states he has been using those.  7. INHALED QUICK-RELIEF TREATMENTS FOR THIS ATTACK: "What treatments have you given yourself so far?" and "How many and how often?" If using an inhaler, ask, "How many puffs?" Note: Routine treatments are 2 puffs every 4 hours as needed. Rescue treatments are 4 puffs repeated every 20 minutes, up to three times as needed.      Pt states he is planning to use his flovent. Pt states he is on average having to use his albuterol three times daily.  8. OTHER SYMPTOMS: "Do you have any other symptoms? (e.g., chest pain, coughing up yellow sputum, fever, runny nose)     Chest tightness, cough with yellow sputum, runny nose, headaches.  9. O2 SATURATION MONITOR:  "Do you use an oxygen saturation monitor (pulse oximeter) at home?" If Yes, "What is your reading (oxygen level) today?" "What is your usual oxygen saturation reading?" (e.g., 95%)     Pt states he does not have one.  Protocols used: Sinus Infection on Antibiotic Follow-up Call-A-AH, Asthma Attack-A-AH

## 2023-07-04 NOTE — Telephone Encounter (Signed)
I appreciate Dr. Karle Starch assistance.

## 2023-07-05 ENCOUNTER — Ambulatory Visit
Admission: RE | Admit: 2023-07-05 | Discharge: 2023-07-05 | Disposition: A | Discharge status: Home / Self Care | Payer: 59 | Source: Ambulatory Visit | Attending: Internal Medicine | Admitting: Internal Medicine

## 2023-07-05 ENCOUNTER — Ambulatory Visit (INDEPENDENT_AMBULATORY_CARE_PROVIDER_SITE_OTHER): Payer: 59 | Admitting: Internal Medicine

## 2023-07-05 ENCOUNTER — Encounter: Payer: Self-pay | Admitting: Internal Medicine

## 2023-07-05 ENCOUNTER — Telehealth: Payer: Self-pay | Admitting: Pharmacy Technician

## 2023-07-05 ENCOUNTER — Other Ambulatory Visit (HOSPITAL_COMMUNITY): Payer: Self-pay

## 2023-07-05 VITALS — BP 118/80 | HR 77 | Temp 97.8°F | Ht 71.0 in | Wt 238.0 lb

## 2023-07-05 DIAGNOSIS — J22 Unspecified acute lower respiratory infection: Secondary | ICD-10-CM | POA: Insufficient documentation

## 2023-07-05 MED ORDER — DOXYCYCLINE HYCLATE 100 MG PO TABS: 100.0000 mg | ORAL_TABLET | Freq: Two times a day (BID) | ORAL | 0 refills | Status: DC

## 2023-07-05 NOTE — Progress Notes (Signed)
Subjective:    Patient ID: Clarence Alvarez, male    DOB: [birth year], 57 y.o.   MRN: [id number]  HPI Here due to persistent respiratory symptoms  Having symptoms since November Tried mucinex but was worsening Had video visit 12/1----augmentin and prednisone. Took a week of work off Then doxy/prednisone 12/9----finished this 5 days ago. Seemed better at first Now having recurrence of symptoms  Bloody nasal secretions and in mucus Sore throat Chest tightness---asthma symptoms Using the albuterol more--and nebulizer Some SOB No fever now.  Some sweats --even last night.  No chills or myalgias Some frontal headache No ear pain  Has taken cyclobenzaprine at night---not new  Current Outpatient Medications on File Prior to Visit  Medication Sig Dispense Refill   Accu-Chek Softclix Lancets lancets Use to check blood sugar daily 100 each 3   albuterol (PROVENTIL) (2.5 MG/3ML) 0.083% nebulizer solution Take 3 mLs (2.5 mg total) by nebulization every 6 (six) hours as needed for wheezing or shortness of breath. 150 mL 1   albuterol (VENTOLIN HFA) 108 (90 Base) MCG/ACT inhaler TAKE 2 PUFFS BY MOUTH EVERY 6 HOURS AS NEEDED FOR WHEEZE OR SHORTNESS OF BREATH 8.5 each 0   amLODipine (NORVASC) 5 MG tablet TAKE 1 TABLET (5 MG TOTAL) BY MOUTH DAILY. 90 tablet 3   Blood Glucose Monitoring Suppl (ACCU-CHEK GUIDE) w/Device KIT Use to check blood sugar daily 1 kit 0   cetirizine (ZYRTEC) 10 MG tablet Take 10 mg by mouth every morning.      Continuous Glucose Sensor (FREESTYLE LIBRE 3 PLUS SENSOR) MISC PLACE 1 SENSOR ON THE SKIN EVERY 14 DAYS. USE TO CHECK GLUCOSE CONTINUOUSLY 2 each 11   cyclobenzaprine (FLEXERIL) 10 MG tablet TAKE 1/2 TO 1 TABLET (5-10 MG TOTAL) BY MOUTH EVERY DAY AT BEDTIME AS NEEDED FOR MUSCLE SPASM 90 tablet 1   glucose blood (ACCU-CHEK GUIDE) test strip Use to check blood sugar daily 100 each 3   Lancets Misc. (ACCU-CHEK SOFTCLIX LANCET DEV) KIT Use to check blood sugar daily 1  kit 0   Multiple Vitamin (MULTIVITAMIN WITH MINERALS) TABS tablet Take 1 tablet by mouth daily.     omeprazole (PRILOSEC) 20 MG capsule TAKE 1 CAPSULE BY MOUTH EVERY DAY 30 capsule 6   sildenafil (VIAGRA) 100 MG tablet TAKE 1/2 - 1 TABLET BY MOUTH DAILY AS NEEDED FOR ERECTILE DYSFUNCTION 5 tablet 5   tamsulosin (FLOMAX) 0.4 MG CAPS capsule Take 1 capsule (0.4 mg total) by mouth daily. 30 capsule 3   beclomethasone (QVAR REDIHALER) 40 MCG/ACT inhaler Inhale 2 puffs into the lungs 2 (two) times daily. 10.6 g 5   [DISCONTINUED] albuterol (PROVENTIL,VENTOLIN) 90 MCG/ACT inhaler Inhale 2 puffs into the lungs every 6 (six) hours as needed for wheezing. 17 g 2   No current facility-administered medications on file prior to visit.    Allergies  Allergen Reactions   Lactose Intolerance (Gi) Other (See Comments)    Gas and bloating   Latex Dermatitis    Skin dsicoloration/blisters    Past Medical History:  Diagnosis Date   Anterior displaced fracture of sternal end of left clavicle, initial encounter for closed fracture 12/12/2020   DDD (degenerative disc disease), cervical 2017   GERD (gastroesophageal reflux disease)    History of adenomatous polyp of colon    03-25-2014  tubular adenoma's /  hyperplastic   History of hiatal hernia    History of kidney stones 2018   Hypertension    Moderate persistent asthma  Multiple rib fractures, 12/2020 traumatic injury 01/25/2021   Seasonal allergies    Spleen laceration 2022    Past Surgical History:  Procedure Laterality Date   73 HOUR PH STUDY N/A 05/04/2018   Procedure: 24 HOUR PH STUDY;  Surgeon: Wyline Mood, MD;  Location: [facility] ENDOSCOPY;  Service: Gastroenterology;  Laterality: N/A;   CERVICAL FUSION  2017   anterior   ESOPHAGEAL MANOMETRY N/A 05/04/2018   Procedure: ESOPHAGEAL MANOMETRY (EM);  Surgeon: Wyline Mood, MD;  Location: [facility] ENDOSCOPY;  Service: Gastroenterology;  Laterality: N/A;   ESOPHAGOGASTRODUODENOSCOPY (EGD) WITH  PROPOFOL N/A 02/24/2018   Procedure: ESOPHAGOGASTRODUODENOSCOPY (EGD) WITH PROPOFOL;  Surgeon: Wyline Mood, MD;  Location: [facility] ENDOSCOPY;  Service: Gastroenterology;  Laterality: N/A;   HARDWARE REMOVAL Left 09/30/2021   Procedure: HARDWARE REMOVAL LEFT CLAVICLE;  Surgeon: Roby Lofts, MD;  Location: [facility] OR;  Service: Orthopedics;  Laterality: Left;   IR ANGIOGRAM FOLLOW UP STUDY  12/16/2020   IR ANGIOGRAM SELECTIVE EACH ADDITIONAL VESSEL  12/16/2020   IR ANGIOGRAM VISCERAL SELECTIVE  12/16/2020   IR EMBO ART  VEN HEMORR LYMPH EXTRAV  INC GUIDE ROADMAPPING  12/16/2020   IR FACET JT NEURO DESTRUCT EA ADD C/T W/IMAG GUIDE  12/04/2021   Dr.Wainer   IR RADIOLOGIST EVAL & MGMT  02/18/2021   IR US GUIDE VASC ACCESS RIGHT  12/16/2020   KNEE SURGERY Right 2015   ORIF CLAVICULAR FRACTURE Left 12/19/2020   Procedure: OPEN REDUCTION INTERNAL FIXATION (ORIF) CLAVICULAR FRACTURE;  Surgeon: Roby Lofts, MD;  Location: [facility] OR;  Service: Orthopedics;  Laterality: Left;   RESECTION DISTAL CLAVICAL Right 12/06/2018   Procedure: RESECTION DISTAL CLAVICAL AND DEBRIDEMENT OF THE SHOULDER;  Surgeon: Salvatore Marvel, MD;  Location: [facility];  Service: Orthopedics;  Laterality: Right;  SCALENE BLOCK   SHOULDER ARTHROSCOPY WITH ROTATOR CUFF REPAIR AND SUBACROMIAL DECOMPRESSION Left 2012   Dr.Wainer @[facility]  = Labrum tear debridement   SHOULDER ARTHROSCOPY WITH ROTATOR CUFF REPAIR AND SUBACROMIAL DECOMPRESSION Right 12/06/2018   Procedure: SHOULDER ARTHROSCOPY WITH ROTATOR CUFF REPAIR AND SUBACROMIAL DECOMPRESSION;  Surgeon: Salvatore Marvel, MD;  Location: [facility];  Service: Orthopedics;  Laterality: Right;  SCALENE BLOCK    Family History  Problem Relation Age of Onset   Diabetes Mother    Hepatitis C Mother    Colon polyps Father 73   Prostate cancer Father    Colon cancer Father 22   Colon polyps Sister    Mental illness Sister    Colon polyps Sister    Mental illness  Brother    Pancreatic cancer Paternal Aunt    Colon polyps Maternal Grandmother 62       reoccurance at age 14   Diabetes Maternal Grandmother    Colon cancer Maternal Grandmother        dx age 37 - recurance at age 54   Stomach cancer Maternal Grandfather    Diabetes Paternal Grandmother    Mental illness Daughter    COPD Other        Aunt   Diabetes Other        siblings   Esophageal cancer Neg Hx    Rectal cancer Neg Hx     Social History   Socioeconomic History   Marital status: Married    Spouse name: Not on file   Number of children: Not on file   Years of education: Not on file   Highest education level: Not on file  Occupational History   Occupation:  facility and grounds manager    Employer: [employer]    Comment: Head maintenance at [employer]  Tobacco Use   Smoking status: Former    Current packs/day: 0.00    Types: Cigarettes    Start date: 07/12/1981    Quit date: 07/12/1984    Years since quitting: 39.0   Smokeless tobacco: Former    Types: Snuff    Quit date: 2017   Tobacco comments:    "EXPERIMENTED" W/SMOKING--NOT EVEN A PACK X 1 WEEK.   Vaping Use   Vaping status: Never Used  Substance and Sexual Activity   Alcohol use: Not Currently   Drug use: No   Sexual activity: Not on file    Comment: vasectomy-- 2010  Other Topics Concern   Not on file  Social History Narrative   Not on file   Social Drivers of Health   Financial Resource Strain: Not on file  Food Insecurity: Not on file  Transportation Needs: Not on file  Physical Activity: Not on file  Stress: Not on file  Social Connections: Not on file  Intimate Partner Violence: Not on file   Review of Systems No N/V Eating okay--especially with the prednisone Daughter and grandkids did have walking pneurmonia     Objective:   Physical Exam Constitutional:      Appearance: Normal appearance.  HENT:     Right Ear: Tympanic membrane and ear canal normal.     Left Ear:  Tympanic membrane and ear canal normal.     Mouth/Throat:     Pharynx: No oropharyngeal exudate or posterior oropharyngeal erythema.  Neck:     Comments: Small non tender anterior cervical nodes Pulmonary:     Effort: Pulmonary effort is normal. No respiratory distress.     Breath sounds: No rhonchi or rales.     Comments: Slight wheeze with forced expiration---but not really tight Musculoskeletal:     Cervical back: Neck supple.  Neurological:     Mental Status: He is alert.            Assessment & Plan:

## 2023-07-05 NOTE — Telephone Encounter (Signed)
Pharmacy Patient Advocate Encounter   Received notification from CoverMyMeds that prior authorization for FreeStyle Libre 3 Plus Sensor is required/requested.   Insurance verification completed.   The patient is insured through [facility] .   Per test claim: PA required; PA submitted to above mentioned insurance via CoverMyMeds Key/confirmation #/EOC [id number] Status is pending

## 2023-07-05 NOTE — Assessment & Plan Note (Addendum)
I suspect Mycoplasma since family had this, time course, and transient response to doxy Will check CXR--- probably normal (though may have slightly increased interstitial markings) Will refill the doxy 100 bid x 10 days Discussed the natural history of the infection--that he should likely be improving by then anyway Continue albuterol MDI/nebs prn

## 2023-07-05 NOTE — Telephone Encounter (Signed)
Okay I will check him this morning

## 2023-07-12 ENCOUNTER — Other Ambulatory Visit: Payer: Self-pay | Admitting: Family Medicine

## 2023-07-12 DIAGNOSIS — I10 Essential (primary) hypertension: Secondary | ICD-10-CM

## 2023-07-12 MED ORDER — AMLODIPINE BESYLATE 5 MG PO TABS: 5.0000 mg | ORAL_TABLET | Freq: Every day | ORAL | 3 refills | Status: DC

## 2023-07-12 MED ORDER — OMEPRAZOLE 20 MG PO CPDR: 20.0000 mg | DELAYED_RELEASE_CAPSULE | Freq: Every day | ORAL | 3 refills | Status: DC

## 2023-07-12 MED ORDER — SILDENAFIL CITRATE 100 MG PO TABS: ORAL_TABLET | ORAL | 5 refills | Status: DC

## 2023-07-12 NOTE — Telephone Encounter (Signed)
 Flovent  not on patient's medication list.  I spoke with patient and he states Dr. Watt had prescribed it for him a while back for maintenance. While on the phone patient states he also told the person that took the message that he needed his amlodipine , omeprazole  and sildenafil .  Refills sent as requested.   Okay to refill?

## 2023-07-12 NOTE — Telephone Encounter (Signed)
 Copied from CRM [id number]. Topic: Clinical - Medication Refill >> Jul 12, 2023 10:34 AM Kara C wrote: Most Recent Primary Care Visit:  Provider: JIMMY ADE I  Department: [facility]  Visit Type: ACUTE  Date: 07/05/2023  Medication: amLODipine  (NORVASC ) 5 MG, sildenafil  (VIAGRA ) 100 MG tablet  Has the patient contacted their pharmacy? Yes, they told the patient to contact their provider because the medication refills did not transfer over to their pharmacy.  (Agent: If no, request that the patient contact the pharmacy for the refill. If patient does not wish to contact the pharmacy document the reason why and proceed with request.) (Agent: If yes, when and what did the pharmacy advise?)  Is this the correct pharmacy for this prescription? Yes If no, delete pharmacy and type the correct one.  This is the patient's preferred pharmacy:  [id number] - [place], [place] - [address] AT [facility] OF [address]  [place] [address] Phone: [phone] Fax: [phone]   Has the prescription been filled recently? No  Is the patient out of the medication? Yes  Has the patient been seen for an appointment in the last year OR does the patient have an upcoming appointment? Yes  Can we respond through MyChart? Yes  Agent: Please be advised that Rx refills may take up to 3 business days. We ask that you follow-up with your pharmacy.

## 2023-07-12 NOTE — Telephone Encounter (Signed)
 Copied from CRM [id number]. Topic: Clinical - Medication Refill >> Jul 12, 2023 10:51 AM Kara C wrote: Most Recent Primary Care Visit:  Provider: JIMMY ADE I  Department: [facility]  Visit Type: ACUTE  Date: 07/05/2023  Medication: Fluticasone  Propionate HFA(FLOVENT  HFA)110 MCG  Has the patient contacted their pharmacy? Yes they told the patient to contact their provider because the medication refills did not transfer over to their pharmacy. (Agent: If no, request that the patient contact the pharmacy for the refill. If patient does not wish to contact the pharmacy document the reason why and proceed with request.) (Agent: If yes, when and what did the pharmacy advise?)  Is this the correct pharmacy for this prescription? Yes If no, delete pharmacy and type the correct one.  This is the patient's preferred pharmacy:  [id number] - [place], [place] - [address] AT [facility] OF [address] [place] [address] Phone: [phone] Fax: [phone]   Has the prescription been filled recently? No  Is the patient out of the medication? Yes  Has the patient been seen for an appointment in the last year OR does the patient have an upcoming appointment? Yes  Can we respond through MyChart? Yes  Agent: Please be advised that Rx refills may take up to 3 business days. We ask that you follow-up with your pharmacy.

## 2023-07-13 NOTE — Telephone Encounter (Signed)
 I think he is currently on Qvar  for asthma maintenance, which is essentially the same thing as Flovent , just made by a different company.  No reason to change.  If for some reason, he would like to go back on Flovent  instead, then we can send in a new prescription and cancel the Qvar .

## 2023-07-14 NOTE — Telephone Encounter (Signed)
 Mr. Samaan notified as instructed by telephone.  He states the Qvar is working so he will just continue with that.  Refill for Flovent cancelled.

## 2023-08-03 NOTE — Telephone Encounter (Signed)
Pharmacy Patient Advocate Encounter  Received notification from [facility] that Prior Authorization for FreeStyle Libre 3 Plus Sensor has been APPROVED from 07-05-2023 to 07-04-2024   PA #/Case ID/Reference #: [id number]

## 2023-08-25 ENCOUNTER — Ambulatory Visit: Payer: Self-pay | Admitting: Family Medicine

## 2023-08-25 ENCOUNTER — Telehealth: Payer: 59 | Admitting: Internal Medicine

## 2023-08-25 ENCOUNTER — Encounter: Payer: Self-pay | Admitting: Internal Medicine

## 2023-08-25 VITALS — Ht 71.0 in | Wt 240.0 lb

## 2023-08-25 DIAGNOSIS — J019 Acute sinusitis, unspecified: Secondary | ICD-10-CM | POA: Diagnosis not present

## 2023-08-25 MED ORDER — DOXYCYCLINE HYCLATE 100 MG PO TABS: 100.0000 mg | ORAL_TABLET | Freq: Two times a day (BID) | ORAL | 1 refills | Status: DC

## 2023-08-25 NOTE — Assessment & Plan Note (Signed)
Has recurrence of similar symptoms from 2 months ago May still be viral Will give the doxycycline 100 bid x 7 days  He feels his asthma hasn't worsened--will hold off on prednisone unless his symptoms increase

## 2023-08-25 NOTE — Telephone Encounter (Signed)
Patient already seen by Dr. Alphonsus Sias this morning.

## 2023-08-25 NOTE — Progress Notes (Signed)
Subjective:    Patient ID: Clarence Alvarez, male    DOB: [birth year], 58 y.o.   MRN: [id number]  HPI Video virtual visit due to respiratory infection Identification done Reviewed limitations and billing and he gave consent Participants---patient in his home and I am in my office  Started 4 days ago Sinus pressure, watery eyes, cough Rhinorrhea and some bloody mucus No fever, chills or aches  Similar to 2 months ago Slight SOB Sleeping with vaporizer Using the albuterol --some (doesn't help congestion)  Using mucinex--slight symptom relief  No flu vaccine Didn't test for COVID  Current Outpatient Medications on File Prior to Visit  Medication Sig Dispense Refill   Accu-Chek Softclix Lancets lancets Use to check blood sugar daily 100 each 3   albuterol (PROVENTIL) (2.5 MG/3ML) 0.083% nebulizer solution Take 3 mLs (2.5 mg total) by nebulization every 6 (six) hours as needed for wheezing or shortness of breath. 150 mL 1   albuterol (VENTOLIN HFA) 108 (90 Base) MCG/ACT inhaler TAKE 2 PUFFS BY MOUTH EVERY 6 HOURS AS NEEDED FOR WHEEZE OR SHORTNESS OF BREATH 8.5 each 0   amLODipine (NORVASC) 5 MG tablet Take 1 tablet (5 mg total) by mouth daily. 90 tablet 3   beclomethasone (QVAR REDIHALER) 40 MCG/ACT inhaler Inhale 2 puffs into the lungs 2 (two) times daily. 10.6 g 5   Blood Glucose Monitoring Suppl (ACCU-CHEK GUIDE) w/Device KIT Use to check blood sugar daily 1 kit 0   cetirizine (ZYRTEC) 10 MG tablet Take 10 mg by mouth every morning.      Continuous Glucose Sensor (FREESTYLE LIBRE 3 PLUS SENSOR) MISC PLACE 1 SENSOR ON THE SKIN EVERY 14 DAYS. USE TO CHECK GLUCOSE CONTINUOUSLY 2 each 11   cyclobenzaprine (FLEXERIL) 10 MG tablet TAKE 1/2 TO 1 TABLET (5-10 MG TOTAL) BY MOUTH EVERY DAY AT BEDTIME AS NEEDED FOR MUSCLE SPASM 90 tablet 1   glucose blood (ACCU-CHEK GUIDE) test strip Use to check blood sugar daily 100 each 3   Lancets Misc. (ACCU-CHEK SOFTCLIX LANCET DEV) KIT Use to check  blood sugar daily 1 kit 0   Multiple Vitamin (MULTIVITAMIN WITH MINERALS) TABS tablet Take 1 tablet by mouth daily.     omeprazole (PRILOSEC) 20 MG capsule Take 1 capsule (20 mg total) by mouth daily. 90 capsule 3   sildenafil (VIAGRA) 100 MG tablet TAKE 1/2 - 1 TABLET BY MOUTH DAILY AS NEEDED FOR ERECTILE DYSFUNCTION 5 tablet 5   tamsulosin (FLOMAX) 0.4 MG CAPS capsule Take 1 capsule (0.4 mg total) by mouth daily. 30 capsule 3   [DISCONTINUED] albuterol (PROVENTIL,VENTOLIN) 90 MCG/ACT inhaler Inhale 2 puffs into the lungs every 6 (six) hours as needed for wheezing. 17 g 2   No current facility-administered medications on file prior to visit.    Allergies  Allergen Reactions   Lactose Intolerance (Gi) Other (See Comments)    Gas and bloating   Latex Dermatitis    Skin dsicoloration/blisters    Past Medical History:  Diagnosis Date   Anterior displaced fracture of sternal end of left clavicle, initial encounter for closed fracture 12/12/2020   DDD (degenerative disc disease), cervical 2017   GERD (gastroesophageal reflux disease)    History of adenomatous polyp of colon    03-25-2014  tubular adenoma's /  hyperplastic   History of hiatal hernia    History of kidney stones 2018   Hypertension    Moderate persistent asthma    Multiple rib fractures, 12/2020 traumatic injury 01/25/2021  Seasonal allergies    Spleen laceration 2022    Past Surgical History:  Procedure Laterality Date   34 HOUR PH STUDY N/A 05/04/2018   Procedure: 24 HOUR PH STUDY;  Surgeon: Wyline Mood, MD;  Location: [facility] ENDOSCOPY;  Service: Gastroenterology;  Laterality: N/A;   CERVICAL FUSION  2017   anterior   ESOPHAGEAL MANOMETRY N/A 05/04/2018   Procedure: ESOPHAGEAL MANOMETRY (EM);  Surgeon: Wyline Mood, MD;  Location: [facility] ENDOSCOPY;  Service: Gastroenterology;  Laterality: N/A;   ESOPHAGOGASTRODUODENOSCOPY (EGD) WITH PROPOFOL N/A 02/24/2018   Procedure: ESOPHAGOGASTRODUODENOSCOPY (EGD) WITH PROPOFOL;   Surgeon: Wyline Mood, MD;  Location: [facility] ENDOSCOPY;  Service: Gastroenterology;  Laterality: N/A;   HARDWARE REMOVAL Left 09/30/2021   Procedure: HARDWARE REMOVAL LEFT CLAVICLE;  Surgeon: Roby Lofts, MD;  Location: [facility] OR;  Service: Orthopedics;  Laterality: Left;   IR ANGIOGRAM FOLLOW UP STUDY  12/16/2020   IR ANGIOGRAM SELECTIVE EACH ADDITIONAL VESSEL  12/16/2020   IR ANGIOGRAM VISCERAL SELECTIVE  12/16/2020   IR EMBO ART  VEN HEMORR LYMPH EXTRAV  INC GUIDE ROADMAPPING  12/16/2020   IR FACET JT NEURO DESTRUCT EA ADD C/T W/IMAG GUIDE  12/04/2021   Dr.Wainer   IR RADIOLOGIST EVAL & MGMT  02/18/2021   IR US GUIDE VASC ACCESS RIGHT  12/16/2020   KNEE SURGERY Right 2015   ORIF CLAVICULAR FRACTURE Left 12/19/2020   Procedure: OPEN REDUCTION INTERNAL FIXATION (ORIF) CLAVICULAR FRACTURE;  Surgeon: Roby Lofts, MD;  Location: [facility] OR;  Service: Orthopedics;  Laterality: Left;   RESECTION DISTAL CLAVICAL Right 12/06/2018   Procedure: RESECTION DISTAL CLAVICAL AND DEBRIDEMENT OF THE SHOULDER;  Surgeon: Salvatore Marvel, MD;  Location: [facility];  Service: Orthopedics;  Laterality: Right;  SCALENE BLOCK   SHOULDER ARTHROSCOPY WITH ROTATOR CUFF REPAIR AND SUBACROMIAL DECOMPRESSION Left 2012   Dr.Wainer @[facility]  = Labrum tear debridement   SHOULDER ARTHROSCOPY WITH ROTATOR CUFF REPAIR AND SUBACROMIAL DECOMPRESSION Right 12/06/2018   Procedure: SHOULDER ARTHROSCOPY WITH ROTATOR CUFF REPAIR AND SUBACROMIAL DECOMPRESSION;  Surgeon: Salvatore Marvel, MD;  Location: [facility];  Service: Orthopedics;  Laterality: Right;  SCALENE BLOCK    Family History  Problem Relation Age of Onset   Diabetes Mother    Hepatitis C Mother    Colon polyps Father 90   Prostate cancer Father    Colon cancer Father 71   Colon polyps Sister    Mental illness Sister    Colon polyps Sister    Mental illness Brother    Pancreatic cancer Paternal Aunt    Colon polyps Maternal Grandmother 27        reoccurance at age 1   Diabetes Maternal Grandmother    Colon cancer Maternal Grandmother        dx age 7 - recurance at age 92   Stomach cancer Maternal Grandfather    Diabetes Paternal Grandmother    Mental illness Daughter    COPD Other        Aunt   Diabetes Other        siblings   Esophageal cancer Neg Hx    Rectal cancer Neg Hx     Social History   Socioeconomic History   Marital status: Married    Spouse name: Not on file   Number of children: Not on file   Years of education: Not on file   Highest education level: Not on file  Occupational History   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   Vaping Use   Vaping status: Never Used  Substance and Sexual Activity   Alcohol use: Not Currently   Drug use: No   Sexual activity: Not on file    Comment: vasectomy-- 2010  Other Topics Concern   Not on file  Social History Narrative   Not on file   Social Drivers of Health   Financial Resource Strain: Not on file  Food Insecurity: Not on file  Transportation Needs: Not on file  Physical Activity: Not on file  Stress: Not on file  Social Connections: Not on file  Intimate Partner Violence: Not on file   Review of Systems No loss of smell or taste No N/V Is able to eat--but less    Objective:   Physical Exam Constitutional:      Appearance: Normal appearance.  Pulmonary:     Effort: Pulmonary effort is normal. No respiratory distress.  Neurological:     Mental Status: He is alert.            Assessment & Plan:

## 2023-08-25 NOTE — Telephone Encounter (Signed)
Copied from CRM [id number]. Topic: Clinical - Red Word Triage >> Aug 25, 2023  9:08 AM Larwance Sachs wrote: Red Word that prompted transfer to Nurse Triage: Patient called in regarding being sick, is experiencing cough, severe sinus pressure and bloody mucus as well as chest congestion   Chief Complaint: sinus pressure Symptoms: productive cough, nasal discharge, chest congestion Frequency: ongoing since Monday Pertinent Negatives: Patient denies fever Disposition: [] ED /[] Urgent Care (no appt availability in office) / [x] Appointment(In office/virtual)/ []  [facility]/ [] Home Care/ [] Refused Recommended Disposition /[]  Mobile Bus/ []  Follow-up with PCP Additional Notes: The patient reported "sinus pressure" in the middle of his forehead, a productive cough with yellow sputum, nasal discharge with blood mucous and chest congestion.  He denied a fever.  He stated that he was treated with similar symptoms in December or January where he was originally prescribed amoxicillin but his symptoms persisted so he was prescribed doxycycline and prednisone and his symptoms resolved.  He stated that he would prefer not to come into the office for a visit.  He was unable to work today due to symptoms.  He was scheduled for a virtual visit for further evaluation.  Reason for Disposition  Lots of coughing  Answer Assessment - Initial Assessment Questions 1. LOCATION: "Where does it hurt?"      Center of forehead  2. ONSET: "When did the sinus pain start?"  (e.g., hours, days)      Monday  3. SEVERITY: "How bad is the pain?"   (Scale 1-10; mild, moderate or severe)   - MILD (1-3): doesn't interfere with normal activities    - MODERATE (4-7): interferes with normal activities (e.g., work or school) or awakens from sleep   - SEVERE (8-10): excruciating pain and patient unable to do any normal activities        3/10 4. RECURRENT SYMPTOM: "Have you ever had sinus problems before?" If Yes, ask:  "When was the last time?" and "What happened that time?"      Yes treated with Amoxicillin and symptoms ongoing so prescribed doxycyline and prednisone  5. NASAL CONGESTION: "Is the nose blocked?" If Yes, ask: "Can you open it or must you breathe through your mouth?"     Runny nose - yellow  6. NASAL DISCHARGE: "Do you have discharge from your nose?" If so ask, "What color?"     Bloody mucous  7. FEVER: "Do you have a fever?" If Yes, ask: "What is it, how was it measured, and when did it start?"      No fever  8. OTHER SYMPTOMS: "Do you have any other symptoms?" (e.g., sore throat, cough, earache, difficulty breathing)     Chest congestion  Productive cough - yellow mucous Mucinex liquid - slightly helpful  Protocols used: Sinus Pain or Congestion-A-AH

## 2023-09-27 ENCOUNTER — Telehealth: Payer: Self-pay | Admitting: Family Medicine

## 2023-09-27 MED ORDER — BUDESONIDE 180 MCG/ACT IN AEPB: 1.0000 | INHALATION_SPRAY | Freq: Two times a day (BID) | RESPIRATORY_TRACT | 1 refills | Status: DC

## 2023-09-27 NOTE — Telephone Encounter (Signed)
 OK.  Can you let him know that his insurance has asked to change his inhaled steroid for asthma again.  I sent in a new script for pulmicort.  Medication Management during today's office visit: Meds ordered this encounter  Medications   budesonide (PULMICORT) 180 MCG/ACT inhaler    Sig: Inhale 1 puff into the lungs 2 (two) times daily.    Dispense:  3 each    Refill:  1

## 2023-09-27 NOTE — Telephone Encounter (Signed)
Mr. Kozlov notified as instructed by telephone.  Patient states understanding.  

## 2023-09-27 NOTE — Telephone Encounter (Signed)
 Received fax from [facility] stating Fluticasone HFA 110 MCG Oral INH is not covered by patient's plan.  That inhaler is not on current medication list.   But preferred alternatives are:  Asmanex HFA AER MCG and Pulmicort INH/MCG

## 2023-10-05 ENCOUNTER — Other Ambulatory Visit: Payer: Self-pay | Admitting: Family Medicine

## 2023-10-05 NOTE — Telephone Encounter (Signed)
 Last office visit 08/25/23 with Dr. Alphonsus Sias for sinusitis.  Last refilled 12/20/2022 for #90 with 1 refill.  Next Appt: No future appointments.

## 2023-10-06 ENCOUNTER — Other Ambulatory Visit (HOSPITAL_COMMUNITY): Payer: Self-pay

## 2023-10-06 ENCOUNTER — Telehealth: Payer: Self-pay | Admitting: Pharmacy Technician

## 2023-10-06 NOTE — Telephone Encounter (Signed)
 I think patient usually pays out of pocket for medication.

## 2023-10-06 NOTE — Telephone Encounter (Signed)
 Pharmacy Patient Advocate Encounter   Received notification from CoverMyMeds that prior authorization for Sildenafil 100 mg tablet is required/requested.   Insurance verification completed.   The patient is insured through [facility] .   Per test claim: PLAN EXCLUSION    However the patient can get a 10 day supply with a prescription filled at any of our [facility] as this medication is on our $10 Drug List

## 2023-10-07 ENCOUNTER — Other Ambulatory Visit (HOSPITAL_COMMUNITY): Payer: Self-pay

## 2023-10-07 NOTE — Addendum Note (Signed)
 Addended by: Damita Lack on: 10/07/2023 09:22 AM   Modules accepted: Orders

## 2023-10-07 NOTE — Telephone Encounter (Signed)
 Mr. Dengel notified as instructed by telephone.  He does not wish to pay for this medication at this time.

## 2023-10-08 ENCOUNTER — Other Ambulatory Visit: Payer: Self-pay | Admitting: Family Medicine

## 2023-12-06 ENCOUNTER — Ambulatory Visit
Admission: RE | Admit: 2023-12-06 | Discharge: 2023-12-06 | Disposition: A | Discharge status: Home / Self Care | Source: Ambulatory Visit | Attending: Physician Assistant | Admitting: Physician Assistant

## 2023-12-06 ENCOUNTER — Telehealth: Payer: Self-pay

## 2023-12-06 ENCOUNTER — Other Ambulatory Visit: Payer: Self-pay

## 2023-12-06 VITALS — BP 120/63 | HR 96 | Temp 97.9°F | Resp 20 | Ht 71.0 in | Wt 240.0 lb

## 2023-12-06 DIAGNOSIS — U071 COVID-19: Secondary | ICD-10-CM | POA: Diagnosis not present

## 2023-12-06 DIAGNOSIS — R051 Acute cough: Secondary | ICD-10-CM | POA: Diagnosis not present

## 2023-12-06 MED ORDER — BENZONATATE 100 MG PO CAPS: 100.0000 mg | ORAL_CAPSULE | Freq: Three times a day (TID) | ORAL | 0 refills | Status: DC

## 2023-12-06 NOTE — Telephone Encounter (Signed)
 Clarence Alvarez

## 2023-12-06 NOTE — ED Provider Notes (Signed)
 [facility]    CSN: [id number] Arrival date & time: 12/06/23  1448      History   Chief Complaint Chief Complaint  Patient presents with   Cough    prod cough yellow phlegm H/a + covid - Entered by patient   Covid Positive    HPI [facility] is a 58 y.o. male.   HPI  He reports concerns for recent COVID infection He states he is feeling run down and fatigued He reports that symptoms started on Sat and seem to have gotten progressively worse He does reports wheezing and states he has a previous hx of asthma. He has an albuterol  neb and Flovent   He denies increased frequency of albuterol  use to assist with breathing since symptoms started Interventions: mucinex, cetirizine, increased hydration efforts   Past Medical History:  Diagnosis Date   Anterior displaced fracture of sternal end of left clavicle, initial encounter for closed fracture 12/12/2020   DDD (degenerative disc disease), cervical 2017   GERD (gastroesophageal reflux disease)    History of adenomatous polyp of colon    03-25-2014  tubular adenoma's /  hyperplastic   History of hiatal hernia    History of kidney stones 2018   Hypertension    Moderate persistent asthma    Multiple rib fractures, 12/2020 traumatic injury 01/25/2021   Seasonal allergies    Spleen laceration 2022    Patient Active Problem List   Diagnosis Date Noted   Acute non-recurrent sinusitis 08/25/2023   Lower respiratory infection 07/05/2023   Multiple rib fractures involving four or more ribs 01/25/2021   h/o prior Spleen laceration 12/13/2020   Diabetes mellitus treated with oral medication (HCC) 05/16/2019   Traumatic complete tear of right rotator cuff 11/30/2018   Essential hypertension 03/20/2018   Degeneration of intervertebral disc of cervical spine without disc herniation 02/29/2016   Moderate persistent asthma with allergic rhinitis without complication 09/08/2006    Past Surgical History:  Procedure  Laterality Date   24 HOUR PH STUDY N/A 05/04/2018   Procedure: 24 HOUR PH STUDY;  Surgeon: Luke Salaam, MD;  Location: [facility] ENDOSCOPY;  Service: Gastroenterology;  Laterality: N/A;   CERVICAL FUSION  2017   anterior   ESOPHAGEAL MANOMETRY N/A 05/04/2018   Procedure: ESOPHAGEAL MANOMETRY (EM);  Surgeon: Luke Salaam, MD;  Location: [facility] ENDOSCOPY;  Service: Gastroenterology;  Laterality: N/A;   ESOPHAGOGASTRODUODENOSCOPY (EGD) WITH PROPOFOL  N/A 02/24/2018   Procedure: ESOPHAGOGASTRODUODENOSCOPY (EGD) WITH PROPOFOL ;  Surgeon: Luke Salaam, MD;  Location: [facility] ENDOSCOPY;  Service: Gastroenterology;  Laterality: N/A;   HARDWARE REMOVAL Left 09/30/2021   Procedure: HARDWARE REMOVAL LEFT CLAVICLE;  Surgeon: Laneta Pintos, MD;  Location: [facility] OR;  Service: Orthopedics;  Laterality: Left;   IR ANGIOGRAM FOLLOW UP STUDY  12/16/2020   IR ANGIOGRAM SELECTIVE EACH ADDITIONAL VESSEL  12/16/2020   IR ANGIOGRAM VISCERAL SELECTIVE  12/16/2020   IR EMBO ART  VEN HEMORR LYMPH EXTRAV  INC GUIDE ROADMAPPING  12/16/2020   IR FACET JT NEURO DESTRUCT EA ADD C/T W/IMAG GUIDE  12/04/2021   Dr.Wainer   IR RADIOLOGIST EVAL & MGMT  02/18/2021   IR US  GUIDE VASC ACCESS RIGHT  12/16/2020   KNEE SURGERY Right 2015   ORIF CLAVICULAR FRACTURE Left 12/19/2020   Procedure: OPEN REDUCTION INTERNAL FIXATION (ORIF) CLAVICULAR FRACTURE;  Surgeon: Laneta Pintos, MD;  Location: [facility] OR;  Service: Orthopedics;  Laterality: Left;   RESECTION DISTAL CLAVICAL Right 12/06/2018   Procedure: RESECTION DISTAL CLAVICAL AND DEBRIDEMENT  OF THE SHOULDER;  Surgeon: Elly Habermann, MD;  Location: [facility];  Service: Orthopedics;  Laterality: Right;  SCALENE BLOCK   SHOULDER ARTHROSCOPY WITH ROTATOR CUFF REPAIR AND SUBACROMIAL DECOMPRESSION Left 2012   Dr.Wainer @[facility]  = Labrum tear debridement   SHOULDER ARTHROSCOPY WITH ROTATOR CUFF REPAIR AND SUBACROMIAL DECOMPRESSION Right 12/06/2018   Procedure: SHOULDER ARTHROSCOPY WITH  ROTATOR CUFF REPAIR AND SUBACROMIAL DECOMPRESSION;  Surgeon: Elly Habermann, MD;  Location: [facility];  Service: Orthopedics;  Laterality: Right;  SCALENE BLOCK       Home Medications    Prior to Admission medications   Medication Sig Start Date End Date Taking? Authorizing Provider  benzonatate (TESSALON) 100 MG capsule Take 1 capsule (100 mg total) by mouth every 8 (eight) hours. 12/06/23  Yes Rondi Ivy E, PA-C  Accu-Chek Softclix Lancets lancets Use to check blood sugar daily 09/17/22   Copland, Jolena Nay, MD  albuterol  (PROVENTIL ) (2.5 MG/3ML) 0.083% nebulizer solution Take 3 mLs (2.5 mg total) by nebulization every 6 (six) hours as needed for wheezing or shortness of breath. 06/29/23   Copland, Jolena Nay, MD  albuterol  (VENTOLIN  HFA) 108 (90 Base) MCG/ACT inhaler TAKE 2 PUFFS BY MOUTH EVERY 6 HOURS AS NEEDED FOR WHEEZE OR SHORTNESS OF BREATH 12/14/22   Copland, Jolena Nay, MD  amLODipine  (NORVASC ) 5 MG tablet Take 1 tablet (5 mg total) by mouth daily. 07/12/23   Copland, Jolena Nay, MD  Blood Glucose Monitoring Suppl (ACCU-CHEK GUIDE) w/Device KIT Use to check blood sugar daily 09/17/22   Copland, Jolena Nay, MD  budesonide  (PULMICORT ) 180 MCG/ACT inhaler Inhale 1 puff into the lungs 2 (two) times daily. 09/27/23   Copland, Jolena Nay, MD  cetirizine (ZYRTEC) 10 MG tablet Take 10 mg by mouth every morning.     [provider]  Continuous Glucose Sensor (FREESTYLE LIBRE 3 PLUS SENSOR) MISC PLACE 1 SENSOR ON THE SKIN EVERY 14 DAYS. USE TO CHECK GLUCOSE CONTINUOUSLY 05/27/23   Copland, Jolena Nay, MD  cyclobenzaprine  (FLEXERIL ) 10 MG tablet TAKE 1/2 TO 1 TABLET (5-10 MG TOTAL) BY MOUTH EVERY DAY AT BEDTIME AS NEEDED FOR MUSCLE SPASM 10/05/23   Copland, Jolena Nay, MD  glucose blood (ACCU-CHEK GUIDE) test strip Use to check blood sugar daily 09/17/22   Copland, Jolena Nay, MD  Lancets Misc. (ACCU-CHEK SOFTCLIX LANCET DEV) KIT Use to check blood sugar daily 09/17/22   Copland, Jolena Nay, MD  Multiple  Vitamin (MULTIVITAMIN WITH MINERALS) TABS tablet Take 1 tablet by mouth daily.    [provider]  omeprazole  (PRILOSEC) 20 MG capsule Take 1 capsule (20 mg total) by mouth daily. 07/12/23   Copland, Jolena Nay, MD  tamsulosin  (FLOMAX ) 0.4 MG CAPS capsule TAKE 1 CAPSULE BY MOUTH EVERY DAY 10/10/23   Copland, Jolena Nay, MD  albuterol  (PROVENTIL ,VENTOLIN ) 90 MCG/ACT inhaler Inhale 2 puffs into the lungs every 6 (six) hours as needed for wheezing. 02/15/11 11/30/18  Claudell Cruz, MD    Family History Family History  Problem Relation Age of Onset   Diabetes Mother    Hepatitis C Mother    Colon polyps Father 87   Prostate cancer Father    Colon cancer Father 67   Colon polyps Sister    Mental illness Sister    Colon polyps Sister    Mental illness Brother    Pancreatic cancer Paternal Aunt    Colon polyps Maternal Grandmother 60       reoccurance at age 73   Diabetes Maternal Grandmother    Colon cancer Maternal Grandmother  dx age 42 - recurance at age 45   Stomach cancer Maternal Grandfather    Diabetes Paternal Grandmother    Mental illness Daughter    COPD Other        Aunt   Diabetes Other        siblings   Esophageal cancer Neg Hx    Rectal cancer Neg Hx     Social History Social History   Tobacco Use   Smoking status: Former    Current packs/day: 0.00    Types: Cigarettes    Start date: 07/12/1981    Quit date: 07/12/1984    Years since quitting: 39.4   Smokeless tobacco: Former    Types: Snuff    Quit date: 2017   Tobacco comments:    "EXPERIMENTED" W/SMOKING--NOT EVEN A PACK X 1 WEEK.   Vaping Use   Vaping status: Never Used  Substance Use Topics   Alcohol use: Not Currently   Drug use: No     Allergies   Lactose intolerance (gi) and Latex   Review of Systems Review of Systems  Constitutional:  Positive for chills and fatigue. Negative for fever.  HENT:  Positive for ear pain, postnasal drip, rhinorrhea, sinus pressure and sore throat.    Eyes:  Positive for discharge.  Respiratory:  Positive for cough and wheezing. Negative for shortness of breath.   Cardiovascular:  Negative for chest pain and leg swelling.  Gastrointestinal:  Negative for diarrhea, nausea and vomiting.  Musculoskeletal:  Positive for back pain, myalgias and neck pain.  Neurological:  Positive for headaches.     Physical Exam Triage Vital Signs ED Triage Vitals  Encounter Vitals Group     BP 12/06/23 1534 120/63     Systolic BP Percentile --      Diastolic BP Percentile --      Pulse Rate 12/06/23 1534 96     Resp 12/06/23 1534 20     Temp 12/06/23 1534 97.9 F (36.6 C)     Temp Source 12/06/23 1534 Oral     SpO2 12/06/23 1534 95 %     Weight 12/06/23 1538 240 lb (108.9 kg)     Height 12/06/23 1538 5\' 11"  (1.803 m)     Head Circumference --      Peak Flow --      Pain Score 12/06/23 1538 3     Pain Loc --      Pain Education --      Exclude from Growth Chart --    No data found.  Updated Vital Signs BP 120/63 (BP Location: Right Arm)   Pulse 96   Temp 97.9 F (36.6 C) (Oral)   Resp 20   Ht 5\' 11"  (1.803 m)   Wt 240 lb (108.9 kg)   SpO2 95%   BMI 33.47 kg/m   Visual Acuity Right Eye Distance:   Left Eye Distance:   Bilateral Distance:    Right Eye Near:   Left Eye Near:    Bilateral Near:     Physical Exam Vitals reviewed.  Constitutional:      General: He is awake.     Appearance: Normal appearance. He is well-developed and well-groomed.  HENT:     Head: Normocephalic and atraumatic.     Right Ear: Hearing, tympanic membrane and ear canal normal.     Left Ear: Hearing, tympanic membrane and ear canal normal.     Mouth/Throat:     Lips: Pink.  Mouth: Mucous membranes are moist.     Pharynx: Oropharynx is clear. Uvula midline. No pharyngeal swelling, oropharyngeal exudate, posterior oropharyngeal erythema, uvula swelling or postnasal drip.     Tonsils: No tonsillar exudate or tonsillar abscesses.  Eyes:      General: Lids are normal. Gaze aligned appropriately.     Extraocular Movements: Extraocular movements intact.  Cardiovascular:     Rate and Rhythm: Normal rate and regular rhythm.     Heart sounds: Normal heart sounds.  Pulmonary:     Effort: Pulmonary effort is normal.     Breath sounds: Normal breath sounds. No decreased air movement. No decreased breath sounds, wheezing, rhonchi or rales.  Musculoskeletal:     Cervical back: Normal range of motion and neck supple.  Lymphadenopathy:     Head:     Right side of head: No submental, submandibular or preauricular adenopathy.     Left side of head: No submental, submandibular or preauricular adenopathy.     Cervical:     Right cervical: No superficial cervical adenopathy.    Left cervical: No superficial cervical adenopathy.     Upper Body:     Right upper body: No supraclavicular adenopathy.     Left upper body: No supraclavicular adenopathy.  Skin:    General: Skin is warm and dry.  Neurological:     General: No focal deficit present.     Mental Status: He is alert and oriented to person, place, and time.  Psychiatric:        Mood and Affect: Mood normal.        Behavior: Behavior normal. Behavior is cooperative.        Thought Content: Thought content normal.        Judgment: Judgment normal.      UC Treatments / Results  Labs (all labs ordered are listed, but only abnormal results are displayed) Labs Reviewed - No data to display  EKG   Radiology No results found.  Procedures Procedures (including critical care time)  Medications Ordered in UC Medications - No data to display  Initial Impression / Assessment and Plan / UC Course  I have reviewed the triage vital signs and the nursing notes.  Pertinent labs & imaging results that were available during my care of the patient were reviewed by me and considered in my medical decision making (see chart for details).      Final Clinical Impressions(s) / UC  Diagnoses   Final diagnoses:  COVID-19  Acute cough  Patient presents today with concerns for COVID-19 infection.  He reports that he has had symptoms since about Saturday and states that he is having nasal congestion, runny nose, postnasal drainage, coughing, increased fatigue and bodyaches.  He does have a previous history of asthma but denies increased use of his rescue medications since becoming ill.  Physical exam at this time is overall reassuring as are vitals.  Reviewed with patient that he is on day 4 out of 5 of symptoms and he is within the Paxlovid antiviral window but I am skeptical that this will provide much benefit given how far into disease course he is.  Patient declined this stating that he would try over-the-counter medications for further symptomatic relief.  Will send in Tessalon Perles to assist with coughing.  Recommend using Tylenol , Robitussin, Mucinex as patient is being treated for high blood pressure.  Reviewed that he can also try using Flonase , nasal saline sprays and rinses as well as continued  use of second-generation antihistamine for further symptomatic relief.  Reviewed importance of using rescue inhalers and nebulizer treatments as needed for breathing improvement.  ED and return precautions reviewed and provided in after visit summary.  Follow-up as needed.     Discharge Instructions      You have tested positive for COVID 19.   Symptoms can last for 3-10 days with lingering cough and intermittent symptoms lasting weeks after that.  The goal of treatment at this time is to reduce your symptoms and discomfort   I recommend using Robitussin and Mucinex (regular formulations, nothing with decongestants or DM)  You can also use Tylenol  for body aches and fever reduction I also recommend adding an antihistamine to your daily regimen This includes medications like Claritin, Allegra, Zyrtec- the generics of these work very well and are usually less expensive I  recommend using Flonase  nasal spray - 2 puffs twice per day to help with your nasal congestion The antihistamines and Flonase  can take a few weeks to provide significant relief from allergy symptoms but should start to provide some benefit soon. You can use a humidifier at night to help with preventing nasal dryness and irritation   If your symptoms are not improving or seem to be getting worse over the next 5 to 7 days you can always return here to urgent care or you can follow-up with your primary care provider for ongoing management Go to the ER if you begin to have more serious symptoms such as shortness of breath, trouble breathing, loss of consciousness, swelling around the eyes, high fever, severe lasting headaches, vision changes or neck pain/stiffness.     ED Prescriptions     Medication Sig Dispense Auth. Provider   benzonatate (TESSALON) 100 MG capsule Take 1 capsule (100 mg total) by mouth every 8 (eight) hours. 21 capsule Berdena Cisek E, PA-C      PDMP not reviewed this encounter.   Lillyanna Glandon, Pearla Bottom, PA-C 12/06/23 1620

## 2023-12-06 NOTE — Telephone Encounter (Signed)
 I spoke with pt; symptoms started on 12/03/23; pt having prod cough with yellow phlegm, some wheezing and SOB on and off; H/A some mid chest pain when coughs and some tightness in chest with congestion.covid test + on 05/26/25no available appts at [facility] and [facility] [place] offices near pt/ pt scheduled appt at [facility] 12/06/23 at 3pm. With ED precautions also given and pt voiced understanding. Sending note to Dr Geralyn Knee and [facility] pool.

## 2023-12-06 NOTE — Discharge Instructions (Addendum)
 You have tested positive for COVID 19.   Symptoms can last for 3-10 days with lingering cough and intermittent symptoms lasting weeks after that.  The goal of treatment at this time is to reduce your symptoms and discomfort   I recommend using Robitussin and Mucinex (regular formulations, nothing with decongestants or DM)  You can also use Tylenol  for body aches and fever reduction I also recommend adding an antihistamine to your daily regimen This includes medications like Claritin, Allegra, Zyrtec- the generics of these work very well and are usually less expensive I recommend using Flonase  nasal spray - 2 puffs twice per day to help with your nasal congestion The antihistamines and Flonase  can take a few weeks to provide significant relief from allergy symptoms but should start to provide some benefit soon. You can use a humidifier at night to help with preventing nasal dryness and irritation   If your symptoms are not improving or seem to be getting worse over the next 5 to 7 days you can always return here to urgent care or you can follow-up with your primary care provider for ongoing management Go to the ER if you begin to have more serious symptoms such as shortness of breath, trouble breathing, loss of consciousness, swelling around the eyes, high fever, severe lasting headaches, vision changes or neck pain/stiffness.

## 2023-12-06 NOTE — ED Triage Notes (Addendum)
 Pt presents with complaints of cough with mucus, congestion, headaches, low energy levels, and generalized body aches x 4 days. Pt states he tested COVID positive with home test yesterday. Denies fevers at home. Taking OTC Mucinex for symptoms with little improvement. Pt currently rates his overall pain a 3/10. Hx of asthma per patient description.

## 2023-12-06 NOTE — Telephone Encounter (Signed)
 With his history + asthma, good idea to get checked in the office.

## 2023-12-14 ENCOUNTER — Other Ambulatory Visit: Payer: Self-pay | Admitting: Family Medicine

## 2023-12-14 NOTE — Telephone Encounter (Signed)
 Last office visit 08/25/2023 with Dr. Letvak for sinusitis.  Last refilled 10/05/23 for #90 with 1 refill.  Next Appt: No future appointments.

## 2023-12-17 ENCOUNTER — Telehealth: Admitting: Family Medicine

## 2023-12-17 DIAGNOSIS — J4541 Moderate persistent asthma with (acute) exacerbation: Secondary | ICD-10-CM | POA: Diagnosis not present

## 2023-12-17 MED ORDER — PREDNISONE 10 MG (21) PO TBPK: ORAL_TABLET | ORAL | 0 refills | Status: DC

## 2023-12-17 MED ORDER — DOXYCYCLINE HYCLATE 100 MG PO TABS
100.0000 mg | ORAL_TABLET | Freq: Two times a day (BID) | ORAL | 0 refills | Status: AC
Start: 2023-12-17 — End: 2023-12-24

## 2023-12-17 NOTE — Progress Notes (Signed)
 Virtual Visit Consent   EURIAH MATLACK, you are scheduled for a virtual visit with a  provider today. Just as with appointments in the office, your consent must be obtained to participate. Your consent will be active for this visit and any virtual visit you may have with one of our providers in the next 365 days. If you have a MyChart account, a copy of this consent can be sent to you electronically.  As this is a virtual visit, video technology does not allow for your provider to perform a traditional examination. This may limit your provider's ability to fully assess your condition. If your provider identifies any concerns that need to be evaluated in person or the need to arrange testing (such as labs, EKG, etc.), we will make arrangements to do so. Although advances in technology are sophisticated, we cannot ensure that it will always work on either your end or our end. If the connection with a video visit is poor, the visit may have to be switched to a telephone visit. With either a video or telephone visit, we are not always able to ensure that we have a secure connection.  By engaging in this virtual visit, you consent to the provision of healthcare and authorize for your insurance to be billed (if applicable) for the services provided during this visit. Depending on your insurance coverage, you may receive a charge related to this service.  I need to obtain your verbal consent now. Are you willing to proceed with your visit today? Clarence Alvarez has provided verbal consent on 12/17/2023 for a virtual visit (video or telephone). [name], [place]  Date: 12/17/2023 3:27 PM   Virtual Visit via Video Note   I, [name], connected with  Clarence Alvarez  ([id number], 03-05-1966) on 12/17/23 at  3:15 PM EDT by a video-enabled telemedicine application and verified that I am speaking with the correct person using two identifiers.  Location: Patient: Virtual Visit Location Patient:  Home Provider: Virtual Visit Location Provider: Home Office   I discussed the limitations of evaluation and management by telemedicine and the availability of in person appointments. The patient expressed understanding and agreed to proceed.    History of Present Illness: Clarence Alvarez is a 57 y.o. who identifies as a male who was assigned male at birth, and is being seen today for c/o having asthma.  Pt states his symptoms are not coming from his asthma.  Pt states the symptoms he had on May 27 th are causing worsening of his symptoms.  Pt states he tested positive for COVID.  He was prescribed tessalon  for the coughing. Pt states he has taken mucinex over the counter to help with his mucus and congestion. Pt states he has shortness of breath, chest mucus and headaches.  Pt states mucus he is coughing up is greenish and waking up in the middle of the night coughing up mucus.  Pt states is using his inhalers a lot more frequently to try to open up his airway.  Pt states in the past to help with his asthma he was prescribed antibiotics and steroids.  Pt states he doesn't like that regimen but it does help him recover and return to normal.   HPI: HPI  Problems:  Patient Active Problem List   Diagnosis Date Noted   Acute non-recurrent sinusitis 08/25/2023   Lower respiratory infection 07/05/2023   Multiple rib fractures involving four or more ribs 01/25/2021   h/o prior Spleen  laceration 12/13/2020   Diabetes mellitus treated with oral medication (HCC) 05/16/2019   Traumatic complete tear of right rotator cuff 11/30/2018   Essential hypertension 03/20/2018   Degeneration of intervertebral disc of cervical spine without disc herniation 02/29/2016   Moderate persistent asthma with allergic rhinitis without complication 09/08/2006    Allergies:  Allergies  Allergen Reactions   Lactose Intolerance (Gi) Other (See Comments)    Gas and bloating   Latex Dermatitis    Skin dsicoloration/blisters    Medications:  Current Outpatient Medications:    doxycycline  (VIBRA -TABS) 100 MG tablet, Take 1 tablet (100 mg total) by mouth 2 (two) times daily for 7 days., Disp: 14 tablet, Rfl: 0   predniSONE  (STERAPRED UNI-PAK 21 TAB) 10 MG (21) TBPK tablet, Take following package directions., Disp: 21 tablet, Rfl: 0   Accu-Chek Softclix Lancets lancets, Use to check blood sugar daily, Disp: 100 each, Rfl: 3   albuterol  (PROVENTIL ) (2.5 MG/3ML) 0.083% nebulizer solution, Take 3 mLs (2.5 mg total) by nebulization every 6 (six) hours as needed for wheezing or shortness of breath., Disp: 150 mL, Rfl: 1   albuterol  (VENTOLIN  HFA) 108 (90 Base) MCG/ACT inhaler, TAKE 2 PUFFS BY MOUTH EVERY 6 HOURS AS NEEDED FOR WHEEZE OR SHORTNESS OF BREATH, Disp: 8.5 each, Rfl: 0   amLODipine  (NORVASC ) 5 MG tablet, Take 1 tablet (5 mg total) by mouth daily., Disp: 90 tablet, Rfl: 3   benzonatate  (TESSALON ) 100 MG capsule, Take 1 capsule (100 mg total) by mouth every 8 (eight) hours., Disp: 21 capsule, Rfl: 0   Blood Glucose Monitoring Suppl (ACCU-CHEK GUIDE) w/Device KIT, Use to check blood sugar daily, Disp: 1 kit, Rfl: 0   budesonide  (PULMICORT ) 180 MCG/ACT inhaler, Inhale 1 puff into the lungs 2 (two) times daily., Disp: 3 each, Rfl: 1   cetirizine (ZYRTEC) 10 MG tablet, Take 10 mg by mouth every morning. , Disp: , Rfl:    Continuous Glucose Sensor (FREESTYLE LIBRE 3 PLUS SENSOR) MISC, PLACE 1 SENSOR ON THE SKIN EVERY 14 DAYS. USE TO CHECK GLUCOSE CONTINUOUSLY, Disp: 2 each, Rfl: 11   cyclobenzaprine  (FLEXERIL ) 10 MG tablet, TAKE 1/2 TO 1 TABLET (5-10 MG TOTAL) BY MOUTH EVERY DAY AT BEDTIME AS NEEDED FOR MUSCLE SPASM, Disp: 90 tablet, Rfl: 1   glucose blood (ACCU-CHEK GUIDE) test strip, Use to check blood sugar daily, Disp: 100 each, Rfl: 3   Lancets Misc. (ACCU-CHEK SOFTCLIX LANCET DEV) KIT, Use to check blood sugar daily, Disp: 1 kit, Rfl: 0   Multiple Vitamin (MULTIVITAMIN WITH MINERALS) TABS tablet, Take 1 tablet by mouth  daily., Disp: , Rfl:    omeprazole  (PRILOSEC) 20 MG capsule, Take 1 capsule (20 mg total) by mouth daily., Disp: 90 capsule, Rfl: 3   tamsulosin  (FLOMAX ) 0.4 MG CAPS capsule, TAKE 1 CAPSULE BY MOUTH EVERY DAY, Disp: 30 capsule, Rfl: 3  Observations/Objective: Patient is well-developed, well-nourished in no acute distress.  Resting comfortably at home.  Head is normocephalic, atraumatic.  No labored breathing.  Speech is clear and coherent with logical content.  Patient is alert and oriented at baseline.    Assessment and Plan: 1. Asthma exacerbation, non-allergic, moderate persistent (Primary) - predniSONE  (STERAPRED UNI-PAK 21 TAB) 10 MG (21) TBPK tablet; Take following package directions.  Dispense: 21 tablet; Refill: 0 - doxycycline  (VIBRA -TABS) 100 MG tablet; Take 1 tablet (100 mg total) by mouth 2 (two) times daily for 7 days.  Dispense: 14 tablet; Refill: 0  -Pt advised to follow up with PCP  or urgent care for worsening symptoms  Follow Up Instructions: I discussed the assessment and treatment plan with the patient. The patient was provided an opportunity to ask questions and all were answered. The patient agreed with the plan and demonstrated an understanding of the instructions.  A copy of instructions were sent to the patient via MyChart unless otherwise noted below.    The patient was advised to call back or seek an in-person evaluation if the symptoms worsen or if the condition fails to improve as anticipated.    [name], PA-C

## 2023-12-17 NOTE — Patient Instructions (Signed)
 Clarence Alvarez, thank you for joining Clarence Roys, PA-C for today's virtual visit.  While this provider is not your primary care provider (PCP), if your PCP is located in our provider database this encounter information will be shared with them immediately following your visit.   A [facility] MyChart account gives you access to today's visit and all your visits, tests, and labs performed at [facility] " click here if you don't have a [facility] MyChart account or go to [email or web address]  Consent: (Patient) Clarence Alvarez provided verbal consent for this virtual visit at the beginning of the encounter.  Current Medications:  Current Outpatient Medications:    Accu-Chek Softclix Lancets lancets, Use to check blood sugar daily, Disp: 100 each, Rfl: 3   albuterol  (PROVENTIL ) (2.5 MG/3ML) 0.083% nebulizer solution, Take 3 mLs (2.5 mg total) by nebulization every 6 (six) hours as needed for wheezing or shortness of breath., Disp: 150 mL, Rfl: 1   albuterol  (VENTOLIN  HFA) 108 (90 Base) MCG/ACT inhaler, TAKE 2 PUFFS BY MOUTH EVERY 6 HOURS AS NEEDED FOR WHEEZE OR SHORTNESS OF BREATH, Disp: 8.5 each, Rfl: 0   amLODipine  (NORVASC ) 5 MG tablet, Take 1 tablet (5 mg total) by mouth daily., Disp: 90 tablet, Rfl: 3   benzonatate  (TESSALON ) 100 MG capsule, Take 1 capsule (100 mg total) by mouth every 8 (eight) hours., Disp: 21 capsule, Rfl: 0   Blood Glucose Monitoring Suppl (ACCU-CHEK GUIDE) w/Device KIT, Use to check blood sugar daily, Disp: 1 kit, Rfl: 0   budesonide  (PULMICORT ) 180 MCG/ACT inhaler, Inhale 1 puff into the lungs 2 (two) times daily., Disp: 3 each, Rfl: 1   cetirizine (ZYRTEC) 10 MG tablet, Take 10 mg by mouth every morning. , Disp: , Rfl:    Continuous Glucose Sensor (FREESTYLE LIBRE 3 PLUS SENSOR) MISC, PLACE 1 SENSOR ON THE SKIN EVERY 14 DAYS. USE TO CHECK GLUCOSE CONTINUOUSLY, Disp: 2 each, Rfl: 11   cyclobenzaprine  (FLEXERIL ) 10 MG tablet, TAKE 1/2 TO 1 TABLET  (5-10 MG TOTAL) BY MOUTH EVERY DAY AT BEDTIME AS NEEDED FOR MUSCLE SPASM, Disp: 90 tablet, Rfl: 1   glucose blood (ACCU-CHEK GUIDE) test strip, Use to check blood sugar daily, Disp: 100 each, Rfl: 3   Lancets Misc. (ACCU-CHEK SOFTCLIX LANCET DEV) KIT, Use to check blood sugar daily, Disp: 1 kit, Rfl: 0   Multiple Vitamin (MULTIVITAMIN WITH MINERALS) TABS tablet, Take 1 tablet by mouth daily., Disp: , Rfl:    omeprazole  (PRILOSEC) 20 MG capsule, Take 1 capsule (20 mg total) by mouth daily., Disp: 90 capsule, Rfl: 3   tamsulosin  (FLOMAX ) 0.4 MG CAPS capsule, TAKE 1 CAPSULE BY MOUTH EVERY DAY, Disp: 30 capsule, Rfl: 3   Medications ordered in this encounter:  No orders of the defined types were placed in this encounter.    *If you need refills on other medications prior to your next appointment, please contact your pharmacy*  Follow-Up: Call back or seek an in-person evaluation if the symptoms worsen or if the condition fails to improve as anticipated.   Virtual Care [phone]  Other Instructions Acute Bronchitis, Adult  Acute bronchitis is sudden inflammation of the main airways (bronchi) that come off the windpipe (trachea) in the lungs. The swelling causes the airways to get smaller and make more mucus than normal. This can make it hard to breathe and can cause coughing or noisy breathing (wheezing). Acute bronchitis may last several weeks. The cough may last longer. Allergies, asthma, and exposure  to smoke may make the condition worse. What are the causes? This condition can be caused by germs and by substances that irritate the lungs, including: Cold and flu viruses. The most common cause of this condition is the virus that causes the common cold. Bacteria. This is less common. Breathing in substances that irritate the lungs, including: Smoke from cigarettes and other forms of tobacco. Dust and pollen. Fumes from household cleaning products, gases, or burned  fuel. Indoor or outdoor air pollution. What increases the risk? The following factors may make you more likely to develop this condition: A weak body's defense system, also called the immune system. A condition that affects your lungs and breathing, such as asthma. What are the signs or symptoms? Common symptoms of this condition include: Coughing. This may bring up clear, yellow, or green mucus from your lungs (sputum). Wheezing. Runny or stuffy nose. Having too much mucus in your lungs (chest congestion). Shortness of breath. Aches and pains, including sore throat or chest. How is this diagnosed? This condition is usually diagnosed based on: Your symptoms and medical history. A physical exam. You may also have other tests, including tests to rule out other conditions, such as pneumonia. These tests include: A test of lung function. Test of a mucus sample to look for the presence of bacteria. Tests to check the oxygen level in your blood. Blood tests. Chest X-ray. How is this treated? Most cases of acute bronchitis clear up over time without treatment. Your health care provider may recommend: Drinking more fluids to help thin your mucus so it is easier to cough up. Taking inhaled medicine (inhaler) to improve air flow in and out of your lungs. Using a vaporizer or a humidifier. These are machines that add water to the air to help you breathe better. Taking a medicine that thins mucus and clears congestion (expectorant). Taking a medicine that prevents or stops coughing (cough suppressant). It is not common to take an antibiotic medicine for this condition. Follow these instructions at home:  Take over-the-counter and prescription medicines only as told by your health care provider. Use an inhaler, vaporizer, or humidifier as told by your health care provider. Take two teaspoons (10 mL) of honey at bedtime to lessen coughing at night. Drink enough fluid to keep your urine pale  yellow. Do not use any products that contain nicotine or tobacco. These products include cigarettes, chewing tobacco, and vaping devices, such as e-cigarettes. If you need help quitting, ask your health care provider. Get plenty of rest. Return to your normal activities as told by your health care provider. Ask your health care provider what activities are safe for you. Keep all follow-up visits. This is important. How is this prevented? To lower your risk of getting this condition again: Wash your hands often with soap and water for at least 20 seconds. If soap and water are not available, use hand sanitizer. Avoid contact with people who have cold symptoms. Try not to touch your mouth, nose, or eyes with your hands. Avoid breathing in smoke or chemical fumes. Breathing smoke or chemical fumes will make your condition worse. Get the flu shot every year. Contact a health care provider if: Your symptoms do not improve after 2 weeks. You have trouble coughing up the mucus. Your cough keeps you awake at night. You have a fever. Get help right away if you: Cough up blood. Feel pain in your chest. Have severe shortness of breath. Faint or keep feeling like you  are going to faint. Have a severe headache. Have a fever or chills that get worse. These symptoms may represent a serious problem that is an emergency. Do not wait to see if the symptoms will go away. Get medical help right away. Call your local emergency services (911 in the [place] Do not drive yourself to the hospital. Summary Acute bronchitis is inflammation of the main airways (bronchi) that come off the windpipe (trachea) in the lungs. The swelling causes the airways to get smaller and make more mucus than normal. Drinking more fluids can help thin your mucus so it is easier to cough up. Take over-the-counter and prescription medicines only as told by your health care provider. Do not use any products that contain nicotine or  tobacco. These products include cigarettes, chewing tobacco, and vaping devices, such as e-cigarettes. If you need help quitting, ask your health care provider. Contact a health care provider if your symptoms do not improve after 2 weeks. This information is not intended to replace advice given to you by your health care provider. Make sure you discuss any questions you have with your health care provider. Document Revised: 10/08/2021 Document Reviewed: 10/29/2020 Elsevier Patient Education  2024 [employer].   If you have been instructed to have an in-person evaluation today at a local Urgent Care facility, please use the link below. It will take you to a list of all of our available [facility], including address, phone number and hours of operation. Please do not delay care.  [facility]  If you or a family member do not have a primary care provider, use the link below to schedule a visit and establish care. When you choose a [facility] primary care physician or advanced practice provider, you gain a long-term partner in health. Find a Primary Care Provider  Learn more about [facility]'s in-office and virtual care options: [facility] - Get Care Now

## 2023-12-23 ENCOUNTER — Other Ambulatory Visit: Payer: Self-pay | Admitting: Family Medicine

## 2023-12-23 ENCOUNTER — Ambulatory Visit: Payer: Self-pay

## 2023-12-23 DIAGNOSIS — J4541 Moderate persistent asthma with (acute) exacerbation: Secondary | ICD-10-CM

## 2023-12-23 MED ORDER — PREDNISONE 20 MG PO TABS: ORAL_TABLET | ORAL | 0 refills | Status: DC

## 2023-12-23 NOTE — Addendum Note (Signed)
 Addended by: Scherrie Curt on: 12/23/2023 11:18 AM   Modules accepted: Orders

## 2023-12-23 NOTE — Telephone Encounter (Signed)
 I extended his prednisone  course and sent that to the pharmacy.  He should follow-up next week if he is not getting better.

## 2023-12-23 NOTE — Telephone Encounter (Signed)
 FYI Only or Action Required?: Action required by provider  Patient was last seen in primary care on 08/25/2023 by Helaine Llanos, MD. Called Nurse Triage reporting Chest Pain. Symptoms began several weeks ago. Interventions attempted: OTC medications: mucinex, tyelenol, Prescription medications: Doxycycline  and Prednisone , and Rest, hydration, or home remedies. Symptoms are: gradually improving.  Triage Disposition: See PCP When Office is Open (Within 3 Days)  Patient/caregiver understands and will follow disposition?: No, wishes to speak with PCP-Pt declined scheduling at this time. He is requesting an refill/ continuation of the prednisone . Will forward to clinic to f/u       Copied from CRM [id number]. Topic: Clinical - Red Word Triage >> Dec 23, 2023  9:56 AM Alethia Huxley E wrote: [facility] that prompted transfer to Nurse Triage: Chest pressure. Patient experiencing chest pressure and congestion related to asthma. Had Covid over Memorial Day weekend and lingering symptoms. Reason for Disposition  [1] Chest pain(s) lasting a few seconds from coughing AND [2] persists > 3 days  Answer Assessment - Initial Assessment Questions 1. LOCATION: Where does it hurt?       Chest tightness in Middle of chest,   2. RADIATION: Does the pain go anywhere else? (e.g., into neck, jaw, arms, back)     No  3. ONSET: When did the chest pain begin? (Minutes, hours or days)      Ongoing for 3 weeks now  4. PATTERN: Does the pain come and go, or has it been constant since it started?  Does it get worse with exertion?      Comes and goes, able to function during the day but has more discomfort in evening and early morning  5. DURATION: How long does it last (e.g., seconds, minutes, hours)     Seconds  6. SEVERITY: How bad is the pain?  (e.g., Scale 1-10; mild, moderate, or severe)    - MILD (1-3): doesn't interfere with normal activities     - MODERATE (4-7): interferes with normal  activities or awakens from sleep    - SEVERE (8-10): excruciating pain, unable to do any normal activities       Mild to moderate  7. CARDIAC RISK FACTORS: Do you have any history of heart problems or risk factors for heart disease? (e.g., angina, prior heart attack; diabetes, high blood pressure, high cholesterol, smoker, or strong family history of heart disease)     No  8. PULMONARY RISK FACTORS: Do you have any history of lung disease?  (e.g., blood clots in lung, asthma, emphysema, birth control pills)     Asthma  9. CAUSE: What do you think is causing the chest pain?     Could be related to asthma  10. OTHER SYMPTOMS: Do you have any other symptoms? (e.g., dizziness, nausea, vomiting, sweating, fever, difficulty breathing, cough)       Productive coughing, sinus congestion, and headache   DX with COVID over memorial day weekend. Seen in UC on 5/27 and advised to take Robitussin, Mucinex, Tylenol , and an antihistamine. Seen again on 6/7 and prescribed Doxycline and Prednison. Today is last day on prednisone  and pt says that symptoms still persist. Pt declined scheduling at this time. He is requesting an refill/ continuation of the prednisone . Will forward to clinic to f/u  Protocols used: Chest Pain-A-AH

## 2023-12-23 NOTE — Telephone Encounter (Signed)
Mr. Kozlov notified as instructed by telephone.  Patient states understanding.  

## 2023-12-23 NOTE — Telephone Encounter (Signed)
 Patient/caregiver understands and will follow disposition?: No, wishes to speak with PCP-Pt declined scheduling at this time. He is requesting an refill/ continuation of the prednisone . Will forward to clinic to f/u

## 2024-02-05 ENCOUNTER — Other Ambulatory Visit: Payer: Self-pay | Admitting: Family Medicine

## 2024-02-06 NOTE — Telephone Encounter (Signed)
 Lvmtcb, sent mychart message

## 2024-02-06 NOTE — Telephone Encounter (Signed)
 Please schedule CPE with fasting labs prior with Dr. Watt for after 02/16/24.

## 2024-02-07 ENCOUNTER — Telehealth: Payer: Self-pay | Admitting: *Deleted

## 2024-02-07 DIAGNOSIS — E119 Type 2 diabetes mellitus without complications: Secondary | ICD-10-CM

## 2024-02-07 DIAGNOSIS — E1169 Type 2 diabetes mellitus with other specified complication: Secondary | ICD-10-CM

## 2024-02-07 DIAGNOSIS — Z125 Encounter for screening for malignant neoplasm of prostate: Secondary | ICD-10-CM

## 2024-02-07 DIAGNOSIS — Z79899 Other long term (current) drug therapy: Secondary | ICD-10-CM

## 2024-02-07 NOTE — Telephone Encounter (Signed)
 Spoke to pt, sch cpe for 02/22/24

## 2024-02-07 NOTE — Telephone Encounter (Signed)
-----   Message from Veva JINNY Ferrari sent at 02/07/2024  2:45 PM EDT ----- Regarding: Lab orders for [facility], 8.6.25 Patient is scheduled for CPX labs, please order future labs, Thanks , Veva

## 2024-02-15 ENCOUNTER — Other Ambulatory Visit (INDEPENDENT_AMBULATORY_CARE_PROVIDER_SITE_OTHER)

## 2024-02-15 DIAGNOSIS — E1169 Type 2 diabetes mellitus with other specified complication: Secondary | ICD-10-CM | POA: Diagnosis not present

## 2024-02-15 DIAGNOSIS — Z79899 Other long term (current) drug therapy: Secondary | ICD-10-CM | POA: Diagnosis not present

## 2024-02-15 DIAGNOSIS — E119 Type 2 diabetes mellitus without complications: Secondary | ICD-10-CM | POA: Diagnosis not present

## 2024-02-15 DIAGNOSIS — E785 Hyperlipidemia, unspecified: Secondary | ICD-10-CM

## 2024-02-15 DIAGNOSIS — Z125 Encounter for screening for malignant neoplasm of prostate: Secondary | ICD-10-CM

## 2024-02-15 LAB — CBC WITH DIFFERENTIAL/PLATELET
Basophils Absolute: 0 K/uL (ref 0.0–0.1)
Basophils Relative: 0.7 % (ref 0.0–3.0)
Eosinophils Absolute: 0.2 K/uL (ref 0.0–0.7)
Eosinophils Relative: 5.7 % — ABNORMAL HIGH (ref 0.0–5.0)
HCT: 42.8 % (ref 39.0–52.0)
Hemoglobin: 14.2 g/dL (ref 13.0–17.0)
Lymphocytes Relative: 60.9 % — ABNORMAL HIGH (ref 12.0–46.0)
Lymphs Abs: 1.7 K/uL (ref 0.7–4.0)
MCHC: 33.1 g/dL (ref 30.0–36.0)
MCV: 87.6 fl (ref 78.0–100.0)
Monocytes Absolute: 0.3 K/uL (ref 0.1–1.0)
Monocytes Relative: 10.5 % (ref 3.0–12.0)
Neutro Abs: 0.6 K/uL — ABNORMAL LOW (ref 1.4–7.7)
Neutrophils Relative %: 22.2 % — ABNORMAL LOW (ref 43.0–77.0)
Platelets: 156 K/uL (ref 150.0–400.0)
RBC: 4.89 Mil/uL (ref 4.22–5.81)
RDW: 14.1 % (ref 11.5–15.5)
WBC: 2.7 K/uL — ABNORMAL LOW (ref 4.0–10.5)

## 2024-02-15 LAB — HEPATIC FUNCTION PANEL
ALT: 27 U/L (ref 0–53)
AST: 27 U/L (ref 0–37)
Albumin: 4.2 g/dL (ref 3.5–5.2)
Alkaline Phosphatase: 76 U/L (ref 39–117)
Bilirubin, Direct: 0.1 mg/dL (ref 0.0–0.3)
Total Bilirubin: 0.4 mg/dL (ref 0.2–1.2)
Total Protein: 6.8 g/dL (ref 6.0–8.3)

## 2024-02-15 LAB — MICROALBUMIN / CREATININE URINE RATIO
Creatinine,U: 197 mg/dL
Microalb Creat Ratio: UNDETERMINED mg/g (ref 0.0–30.0)
Microalb, Ur: <0.7 mg/dL

## 2024-02-15 LAB — LIPID PANEL
Cholesterol: 192 mg/dL (ref 0–200)
HDL: 27.9 mg/dL — ABNORMAL LOW (ref >39.00)
LDL Cholesterol: 122 mg/dL — ABNORMAL HIGH (ref 0–99)
NonHDL: 164.49
Total CHOL/HDL Ratio: 7
Triglycerides: 210 mg/dL — ABNORMAL HIGH (ref 0.0–149.0)
VLDL: 42 mg/dL — ABNORMAL HIGH (ref 0.0–40.0)

## 2024-02-15 LAB — BASIC METABOLIC PANEL WITH GFR
BUN: 12 mg/dL (ref 6–23)
CO2: 34 meq/L — ABNORMAL HIGH (ref 19–32)
Calcium: 9.7 mg/dL (ref 8.4–10.5)
Chloride: 102 meq/L (ref 96–112)
Creatinine, Ser: 1.1 mg/dL (ref 0.40–1.50)
GFR: 73.41 mL/min (ref >60.00)
Glucose, Bld: 117 mg/dL — ABNORMAL HIGH (ref 70–99)
Potassium: 4.4 meq/L (ref 3.5–5.1)
Sodium: 139 meq/L (ref 135–145)

## 2024-02-15 LAB — HEMOGLOBIN A1C: Hgb A1c MFr Bld: 7.1 % — ABNORMAL HIGH (ref 4.6–6.5)

## 2024-02-17 LAB — PSA, TOTAL WITH REFLEX TO PSA, FREE: PSA, Total: 0.8 ng/mL (ref <=4.0)

## 2024-02-19 NOTE — Progress Notes (Unsigned)
 Nirvan Laban T. Zakia Sainato, MD, CAQ Sports Medicine [facility] at [facility] [address] [place], [address]  Phone: [phone]  FAX: [phone]  Clarence Alvarez - 58 y.o. male  MRN [id number]  Date of Birth: [birth year]  Date: 02/22/2024  PCP: Watt Mirza, MD  Referral: Watt Mirza, MD  No chief complaint on file.  Patient Care Team: Watt Mirza, MD as PCP - General (Family Medicine) Subjective:   Clarence Alvarez is a 58 y.o. pleasant patient who presents with the following:  Preventative Health Maintenance Visit:  Health Maintenance Summary Reviewed and updated, unless pt declines services.  Tobacco History Reviewed. Alcohol: No concerns, no excessive use Exercise Habits: Some activity, rec at least 30 mins 5 times a week STD concerns: no risk or activity to increase risk Drug Use: None  He is a pleasant patient, who I have known for many years.  He does have well-controlled diabetes and fairly well-controlled asthma.  A few years ago, he did have major trauma, fracture, splenic laceration, multiple rib fractures.    Health Maintenance  Topic Date Due   Hepatitis B Vaccines (1 of 3 - 19+ 3-dose series) Never done   Zoster Vaccines- Shingrix (1 of 2) Never done   OPHTHALMOLOGY EXAM  07/25/2020   COVID-19 Vaccine (5 - 2024-25 season) 03/13/2023   INFLUENZA VACCINE  02/10/2024   FOOT EXAM  02/16/2024   HEMOGLOBIN A1C  08/17/2024   Diabetic kidney evaluation - eGFR measurement  02/14/2025   Diabetic kidney evaluation - Urine ACR  02/14/2025   Colonoscopy  01/05/2027   DTaP/Tdap/Td (3 - Td or Tdap) 12/13/2027   Pneumococcal Vaccine: 19-49 Years  Completed   Pneumococcal Vaccine: 50+ Years  Completed   Hepatitis C Screening  Completed   HIV Screening  Completed   HPV VACCINES  Aged Out   Meningococcal B Vaccine  Aged Out   Immunization History  Administered Date(s) Administered   Influenza Whole 04/11/2012    Influenza,inj,Quad PF,6+ Mos 04/22/2021   Influenza-Unspecified 03/12/2016, 04/10/2017, 03/12/2018, 04/06/2019, 05/02/2020   PFIZER(Purple Top)SARS-COV-2 Vaccination 09/08/2019, 09/29/2019, 06/13/2020, 04/22/2021   PNEUMOCOCCAL CONJUGATE-20 12/24/2021   Pneumococcal Polysaccharide-23 10/02/2018   Td 05/12/2004   Tdap 12/12/2017   Patient Active Problem List   Diagnosis Date Noted   Diabetes mellitus treated with oral medication (HCC) 05/16/2019    Priority: High   Moderate persistent asthma with allergic rhinitis without complication 09/08/2006    Priority: High   Essential hypertension 03/20/2018    Priority: Medium    h/o prior Spleen laceration 12/13/2020    Priority: Low   Degeneration of intervertebral disc of cervical spine without disc herniation 02/29/2016    Priority: Low   Multiple rib fractures involving four or more ribs 01/25/2021   Traumatic complete tear of right rotator cuff 11/30/2018    Past Medical History:  Diagnosis Date   Anterior displaced fracture of sternal end of left clavicle, initial encounter for closed fracture 12/12/2020   DDD (degenerative disc disease), cervical 2017   GERD (gastroesophageal reflux disease)    History of adenomatous polyp of colon    03-25-2014  tubular adenoma's /  hyperplastic   History of hiatal hernia    History of kidney stones 2018   Hypertension    Moderate persistent asthma    Multiple rib fractures, 12/2020 traumatic injury 01/25/2021   Seasonal allergies    Spleen laceration 2022    Past Surgical History:  Procedure Laterality Date  24 HOUR PH STUDY N/A 05/04/2018   Procedure: 24 HOUR PH STUDY;  Surgeon: Therisa Bi, MD;  Location: [facility] ENDOSCOPY;  Service: Gastroenterology;  Laterality: N/A;   CERVICAL FUSION  2017   anterior   ESOPHAGEAL MANOMETRY N/A 05/04/2018   Procedure: ESOPHAGEAL MANOMETRY (EM);  Surgeon: Therisa Bi, MD;  Location: [facility] ENDOSCOPY;  Service: Gastroenterology;  Laterality: N/A;    ESOPHAGOGASTRODUODENOSCOPY (EGD) WITH PROPOFOL  N/A 02/24/2018   Procedure: ESOPHAGOGASTRODUODENOSCOPY (EGD) WITH PROPOFOL ;  Surgeon: Therisa Bi, MD;  Location: [facility] ENDOSCOPY;  Service: Gastroenterology;  Laterality: N/A;   HARDWARE REMOVAL Left 09/30/2021   Procedure: HARDWARE REMOVAL LEFT CLAVICLE;  Surgeon: Kendal Franky SQUIBB, MD;  Location: [facility] OR;  Service: Orthopedics;  Laterality: Left;   IR ANGIOGRAM FOLLOW UP STUDY  12/16/2020   IR ANGIOGRAM SELECTIVE EACH ADDITIONAL VESSEL  12/16/2020   IR ANGIOGRAM VISCERAL SELECTIVE  12/16/2020   IR EMBO ART  VEN HEMORR LYMPH EXTRAV  INC GUIDE ROADMAPPING  12/16/2020   IR FACET JT NEURO DESTRUCT EA ADD C/T W/IMAG GUIDE  12/04/2021   Dr.Wainer   IR RADIOLOGIST EVAL & MGMT  02/18/2021   IR US  GUIDE VASC ACCESS RIGHT  12/16/2020   KNEE SURGERY Right 2015   ORIF CLAVICULAR FRACTURE Left 12/19/2020   Procedure: OPEN REDUCTION INTERNAL FIXATION (ORIF) CLAVICULAR FRACTURE;  Surgeon: Kendal Franky SQUIBB, MD;  Location: [facility] OR;  Service: Orthopedics;  Laterality: Left;   RESECTION DISTAL CLAVICAL Right 12/06/2018   Procedure: RESECTION DISTAL CLAVICAL AND DEBRIDEMENT OF THE SHOULDER;  Surgeon: Jane Charleston, MD;  Location: [facility];  Service: Orthopedics;  Laterality: Right;  SCALENE BLOCK   SHOULDER ARTHROSCOPY WITH ROTATOR CUFF REPAIR AND SUBACROMIAL DECOMPRESSION Left 2012   Dr.Wainer @[facility]  = Labrum tear debridement   SHOULDER ARTHROSCOPY WITH ROTATOR CUFF REPAIR AND SUBACROMIAL DECOMPRESSION Right 12/06/2018   Procedure: SHOULDER ARTHROSCOPY WITH ROTATOR CUFF REPAIR AND SUBACROMIAL DECOMPRESSION;  Surgeon: Jane Charleston, MD;  Location: [facility];  Service: Orthopedics;  Laterality: Right;  SCALENE BLOCK    Family History  Problem Relation Age of Onset   Diabetes Mother    Hepatitis C Mother    Colon polyps Father 14   Prostate cancer Father    Colon cancer Father 75   Colon polyps Sister    Mental illness Sister     Colon polyps Sister    Mental illness Brother    Pancreatic cancer Paternal Aunt    Colon polyps Maternal Grandmother 69       reoccurance at age 61   Diabetes Maternal Grandmother    Colon cancer Maternal Grandmother        dx age 72 - recurance at age 58   Stomach cancer Maternal Grandfather    Diabetes Paternal Grandmother    Mental illness Daughter    COPD Other        Aunt   Diabetes Other        siblings   Esophageal cancer Neg Hx    Rectal cancer Neg Hx     Social History   Social History Narrative   Not on file    Past Medical History, Surgical History, Social History, Family History, Problem List, Medications, and Allergies have been reviewed and updated if relevant.  Review of Systems: Pertinent positives are listed above.  Otherwise, a full 14 point review of systems has been done in full and it is negative except where it is noted positive.  Objective:   There were no vitals taken  for this visit. Ideal Body Weight:    Ideal Body Weight:   No results found.    08/06/2022   12:46 PM 03/02/2021    2:09 PM 02/27/2020   11:39 AM 02/08/2019    8:22 AM 12/12/2017   10:32 AM  Depression screen PHQ 2/9  Decreased Interest 0 0 0 0 0  Down, Depressed, Hopeless 0 1 0 0 1  PHQ - 2 Score 0 1 0 0 1     GEN: well developed, well nourished, no acute distress Eyes: conjunctiva and lids normal, PERRLA, EOMI ENT: TM clear, nares clear, oral exam WNL Neck: supple, no lymphadenopathy, no thyromegaly, no JVD Pulm: clear to auscultation and percussion, respiratory effort normal CV: regular rate and rhythm, S1-S2, no murmur, rub or gallop, no bruits, peripheral pulses normal and symmetric, no cyanosis, clubbing, edema or varicosities GI: soft, non-tender; no hepatosplenomegaly, masses; active bowel sounds all quadrants GU: deferred Lymph: no cervical, axillary or inguinal adenopathy MSK: gait normal, muscle tone and strength WNL, no joint swelling, effusions, discoloration,  crepitus  SKIN: clear, good turgor, color WNL, no rashes, lesions, or ulcerations Neuro: normal mental status, normal strength, sensation, and motion Psych: alert; oriented to person, place and time, normally interactive and not anxious or depressed in appearance.  All labs reviewed with patient. Results for orders placed or performed in visit on 02/15/24  Microalbumin / creatinine urine ratio   Collection Time: 02/15/24  8:44 AM  Result Value Ref Range   Microalb, Ur <0.7 mg/dL   Creatinine,U 802.9 mg/dL   Microalb Creat Ratio Unable to calculate 0.0 - 30.0 mg/g  Hemoglobin A1c   Collection Time: 02/15/24  8:44 AM  Result Value Ref Range   Hgb A1c MFr Bld 7.1 (H) 4.6 - 6.5 %  Basic metabolic panel   Collection Time: 02/15/24  8:44 AM  Result Value Ref Range   Sodium 139 135 - 145 mEq/L   Potassium 4.4 3.5 - 5.1 mEq/L   Chloride 102 96 - 112 mEq/L   CO2 34 (H) 19 - 32 mEq/L   Glucose, Bld 117 (H) 70 - 99 mg/dL   BUN 12 6 - 23 mg/dL   Creatinine, Ser 8.89 0.40 - 1.50 mg/dL   GFR 26.58 >39.99 mL/min   Calcium  9.7 8.4 - 10.5 mg/dL  CBC with Differential/Platelet   Collection Time: 02/15/24  8:44 AM  Result Value Ref Range   WBC 2.7 (L) 4.0 - 10.5 K/uL   RBC 4.89 4.22 - 5.81 Mil/uL   Hemoglobin 14.2 13.0 - 17.0 g/dL   HCT 57.1 60.9 - 47.9 %   MCV 87.6 78.0 - 100.0 fl   MCHC 33.1 30.0 - 36.0 g/dL   RDW 85.8 88.4 - 84.4 %   Platelets 156.0 150.0 - 400.0 K/uL   Neutrophils Relative % 22.2 (L) 43.0 - 77.0 %   Lymphocytes Relative 60.9 (H) 12.0 - 46.0 %   Monocytes Relative 10.5 3.0 - 12.0 %   Eosinophils Relative 5.7 (H) 0.0 - 5.0 %   Basophils Relative 0.7 0.0 - 3.0 %   Neutro Abs 0.6 (L) 1.4 - 7.7 K/uL   Lymphs Abs 1.7 0.7 - 4.0 K/uL   Monocytes Absolute 0.3 0.1 - 1.0 K/uL   Eosinophils Absolute 0.2 0.0 - 0.7 K/uL   Basophils Absolute 0.0 0.0 - 0.1 K/uL  Hepatic function panel   Collection Time: 02/15/24  8:44 AM  Result Value Ref Range   Total Bilirubin 0.4 0.2 - 1.2  mg/dL   Bilirubin, Direct 0.1 0.0 - 0.3 mg/dL   Alkaline Phosphatase 76 39 - 117 U/L   AST 27 0 - 37 U/L   ALT 27 0 - 53 U/L   Total Protein 6.8 6.0 - 8.3 g/dL   Albumin 4.2 3.5 - 5.2 g/dL  Lipid panel   Collection Time: 02/15/24  8:44 AM  Result Value Ref Range   Cholesterol 192 0 - 200 mg/dL   Triglycerides 789.9 (H) 0.0 - 149.0 mg/dL   HDL 72.09 (L) >60.99 mg/dL   VLDL 57.9 (H) 0.0 - 59.9 mg/dL   LDL Cholesterol 877 (H) 0 - 99 mg/dL   Total CHOL/HDL Ratio 7    NonHDL 164.49   PSA, Total with Reflex to PSA, Free   Collection Time: 02/15/24  8:44 AM  Result Value Ref Range   PSA, Total 0.8 < OR = 4.0 ng/mL    Assessment and Plan:     ICD-10-CM   1. Healthcare maintenance  Z00.00       Health Maintenance Exam: The patient's preventative maintenance and recommended screening tests for an annual wellness exam were reviewed in full today. Brought up to date unless services declined.  Counselled on the importance of diet, exercise, and its role in overall health and mortality. The patient's FH and SH was reviewed, including their home life, tobacco status, and drug and alcohol status.  Follow-up in 1 year for physical exam or additional follow-up below.  Disposition: No follow-ups on file.  No orders of the defined types were placed in this encounter.  There are no discontinued medications. No orders of the defined types were placed in this encounter.   Signed,  Jacques DASEN. Darina Hartwell, MD   Allergies as of 02/22/2024       Reactions   Lactose Intolerance (gi) Other (See Comments)   Gas and bloating   Latex Dermatitis   Skin dsicoloration/blisters        Medication List        Accurate as of February 19, 2024  4:49 PM. If you have any questions, ask your nurse or doctor.          Accu-Chek Guide test strip Generic drug: glucose blood Use to check blood sugar daily   Accu-Chek Guide w/Device Kit Use to check blood sugar daily   Accu-Chek Softclix  Lancet Dev Kit Use to check blood sugar daily   Accu-Chek Softclix Lancets lancets Use to check blood sugar daily   albuterol  108 (90 Base) MCG/ACT inhaler Commonly known as: VENTOLIN  HFA TAKE 2 PUFFS BY MOUTH EVERY 6 HOURS AS NEEDED FOR WHEEZE OR SHORTNESS OF BREATH   albuterol  (2.5 MG/3ML) 0.083% nebulizer solution Commonly known as: PROVENTIL  Take 3 mLs (2.5 mg total) by nebulization every 6 (six) hours as needed for wheezing or shortness of breath.   amLODipine  5 MG tablet Commonly known as: NORVASC  Take 1 tablet (5 mg total) by mouth daily.   benzonatate  100 MG capsule Commonly known as: TESSALON  Take 1 capsule (100 mg total) by mouth every 8 (eight) hours.   budesonide  180 MCG/ACT inhaler Commonly known as: PULMICORT  Inhale 1 puff into the lungs 2 (two) times daily.   cetirizine 10 MG tablet Commonly known as: ZYRTEC Take 10 mg by mouth every morning.   cyclobenzaprine  10 MG tablet Commonly known as: FLEXERIL  TAKE 1/2 TO 1 TABLET (5-10 MG TOTAL) BY MOUTH EVERY DAY AT BEDTIME AS NEEDED FOR MUSCLE SPASM   FreeStyle Libre 3 Plus Sensor  Misc PLACE 1 SENSOR ON THE SKIN EVERY 14 DAYS. USE TO CHECK GLUCOSE CONTINUOUSLY   multivitamin with minerals Tabs tablet Take 1 tablet by mouth daily.   omeprazole  20 MG capsule Commonly known as: PRILOSEC Take 1 capsule (20 mg total) by mouth daily.   predniSONE  20 MG tablet Commonly known as: DELTASONE  2 tabs po for 4 days, then 1 tab po for 4 days   tamsulosin  0.4 MG Caps capsule Commonly known as: FLOMAX  TAKE 1 CAPSULE BY MOUTH EVERY DAY

## 2024-02-22 ENCOUNTER — Ambulatory Visit (INDEPENDENT_AMBULATORY_CARE_PROVIDER_SITE_OTHER): Admitting: Family Medicine

## 2024-02-22 ENCOUNTER — Encounter: Payer: Self-pay | Admitting: Family Medicine

## 2024-02-22 VITALS — BP 110/78 | HR 91 | Temp 98.0°F | Ht 71.25 in | Wt 248.1 lb

## 2024-02-22 DIAGNOSIS — Z0000 Encounter for general adult medical examination without abnormal findings: Secondary | ICD-10-CM | POA: Diagnosis not present

## 2024-02-22 MED ORDER — SILDENAFIL CITRATE 100 MG PO TABS: 100.0000 mg | ORAL_TABLET | Freq: Every day | ORAL | 5 refills | Status: DC | PRN

## 2024-02-22 NOTE — Patient Instructions (Signed)
 Try Good Rx website

## 2024-02-23 ENCOUNTER — Telehealth: Payer: Self-pay | Admitting: Family Medicine

## 2024-02-23 NOTE — Telephone Encounter (Signed)
 Received fax from [facility] stating insurance does not cover sildenafil  100 mg.  Preferred alternative; Tadalafil, Vardenafil  or Avanafil.  Pharmacy is asking for change in medication.  Please advise.

## 2024-02-24 MED ORDER — VARDENAFIL HCL 20 MG PO TABS: 10.0000 mg | ORAL_TABLET | Freq: Every day | ORAL | 11 refills | Status: AC | PRN

## 2024-02-24 NOTE — Addendum Note (Signed)
 Addended by: WATT MIRZA on: 02/24/2024 11:50 AM   Modules accepted: Orders

## 2024-02-24 NOTE — Telephone Encounter (Signed)
 Change to Levitra  generic.

## 2024-02-27 NOTE — Telephone Encounter (Signed)
 Received fax today from [facility] now stating Vardenafil  is not covered.  Preferred  Tadalafil, Sildenafil  and Avanafil was covered.  I have faxed back the form to [facility] asking them to clarify what is really covered.

## 2024-03-31 ENCOUNTER — Other Ambulatory Visit: Payer: Self-pay | Admitting: Family Medicine

## 2024-04-19 ENCOUNTER — Other Ambulatory Visit: Payer: Self-pay | Admitting: Family Medicine

## 2024-04-19 MED ORDER — TAMSULOSIN HCL 0.4 MG PO CAPS: 0.4000 mg | ORAL_CAPSULE | Freq: Every day | ORAL | 3 refills | Status: AC

## 2024-06-12 ENCOUNTER — Telehealth: Admitting: Physician Assistant

## 2024-06-12 DIAGNOSIS — J4541 Moderate persistent asthma with (acute) exacerbation: Secondary | ICD-10-CM | POA: Diagnosis not present

## 2024-06-12 MED ORDER — PREDNISONE 20 MG PO TABS: 40.0000 mg | ORAL_TABLET | Freq: Every day | ORAL | 0 refills | Status: AC

## 2024-06-12 MED ORDER — AZITHROMYCIN 250 MG PO TABS: ORAL_TABLET | ORAL | 0 refills | Status: AC

## 2024-06-12 NOTE — Progress Notes (Signed)
 Virtual Visit Consent   Clarence Alvarez, you are scheduled for a virtual visit with a [facility] provider today. Just as with appointments in the office, your consent must be obtained to participate. Your consent will be active for this visit and any virtual visit you may have with one of our providers in the next 365 days. If you have a MyChart account, a copy of this consent can be sent to you electronically.  As this is a virtual visit, video technology does not allow for your provider to perform a traditional examination. This may limit your provider's ability to fully assess your condition. If your provider identifies any concerns that need to be evaluated in person or the need to arrange testing (such as labs, EKG, etc.), we will make arrangements to do so. Although advances in technology are sophisticated, we cannot ensure that it will always work on either your end or our end. If the connection with a video visit is poor, the visit may have to be switched to a telephone visit. With either a video or telephone visit, we are not always able to ensure that we have a secure connection.  By engaging in this virtual visit, you consent to the provision of healthcare and authorize for your insurance to be billed (if applicable) for the services provided during this visit. Depending on your insurance coverage, you may receive a charge related to this service.  I need to obtain your verbal consent now. Are you willing to proceed with your visit today? Clarence Alvarez has provided verbal consent on 06/12/2024 for a virtual visit (video or telephone). Harlene PEDLAR Ward, PA-C  Date: 06/12/2024 5:36 PM   Virtual Visit via Video Note   I, Harlene PEDLAR Ward, connected with  Clarence Alvarez  ([id number], August 05, 1965) on 06/12/24 at  5:30 PM EST by a video-enabled telemedicine application and verified that I am speaking with the correct person using two identifiers.  Location: Patient: Virtual Visit Location  Patient: Home Provider: Virtual Visit Location Provider: Home Office   I discussed the limitations of evaluation and management by telemedicine and the availability of in person appointments. The patient expressed understanding and agreed to proceed.    History of Present Illness: Clarence Alvarez is a 58 y.o. who identifies as a male who was assigned male at birth, and is being seen today for cough, congestion, sinus pressure, headache that started 3-4 days ago.  Denies fever, chills, shortness of breath or wheezing.  He does have a h/o asthma.  Reports beginning to feel some chest congestion.  Has not had to use inhaler.  SABRA  HPI: HPI  Problems:  Patient Active Problem List   Diagnosis Date Noted   Multiple rib fractures involving four or more ribs 01/25/2021   h/o prior Spleen laceration 12/13/2020   Diabetes mellitus treated with oral medication (HCC) 05/16/2019   Traumatic complete tear of right rotator cuff 11/30/2018   Essential hypertension 03/20/2018   Degeneration of intervertebral disc of cervical spine without disc herniation 02/29/2016   Moderate persistent asthma with allergic rhinitis without complication 09/08/2006    Allergies:  Allergies  Allergen Reactions   Lactose Intolerance (Gi) Other (See Comments)    Gas and bloating   Latex Dermatitis    Skin dsicoloration/blisters   Medications:  Current Outpatient Medications:    azithromycin  (ZITHROMAX ) 250 MG tablet, Take 2 tablets on day 1, then 1 tablet daily on days 2 through 5, Disp: 6 tablet, Rfl:  0   predniSONE  (DELTASONE ) 20 MG tablet, Take 2 tablets (40 mg total) by mouth daily with breakfast for 5 days., Disp: 10 tablet, Rfl: 0   Accu-Chek Softclix Lancets lancets, Use to check blood sugar daily, Disp: 100 each, Rfl: 3   albuterol  (VENTOLIN  HFA) 108 (90 Base) MCG/ACT inhaler, TAKE 2 PUFFS BY MOUTH EVERY 6 HOURS AS NEEDED FOR WHEEZE OR SHORTNESS OF BREATH, Disp: 8.5 each, Rfl: 0   amLODipine  (NORVASC ) 5 MG tablet,  Take 1 tablet (5 mg total) by mouth daily., Disp: 90 tablet, Rfl: 3   Blood Glucose Monitoring Suppl (ACCU-CHEK GUIDE) w/Device KIT, Use to check blood sugar daily, Disp: 1 kit, Rfl: 0   cetirizine (ZYRTEC) 10 MG tablet, Take 10 mg by mouth every morning. , Disp: , Rfl:    Continuous Glucose Sensor (FREESTYLE LIBRE 3 PLUS SENSOR) MISC, PLACE 1 SENSOR ON THE SKIN EVERY 14 DAYS. USE TO CHECK GLUCOSE CONTINUOUSLY, Disp: 2 each, Rfl: 11   cyclobenzaprine  (FLEXERIL ) 10 MG tablet, TAKE 1/2 TO 1 TABLET (5-10 MG TOTAL) BY MOUTH EVERY DAY AT BEDTIME AS NEEDED FOR MUSCLE SPASM, Disp: 90 tablet, Rfl: 1   glucose blood (ACCU-CHEK GUIDE) test strip, Use to check blood sugar daily, Disp: 100 each, Rfl: 3   Lancets Misc. (ACCU-CHEK SOFTCLIX LANCET DEV) KIT, Use to check blood sugar daily, Disp: 1 kit, Rfl: 0   Multiple Vitamin (MULTIVITAMIN WITH MINERALS) TABS tablet, Take 1 tablet by mouth daily., Disp: , Rfl:    omeprazole  (PRILOSEC) 20 MG capsule, Take 1 capsule (20 mg total) by mouth daily., Disp: 90 capsule, Rfl: 3   rosuvastatin  (CRESTOR ) 20 MG tablet, TAKE 1 TABLET BY MOUTH EVERY DAY, Disp: 90 tablet, Rfl: 3   tamsulosin  (FLOMAX ) 0.4 MG CAPS capsule, Take 1 capsule (0.4 mg total) by mouth daily., Disp: 90 capsule, Rfl: 3   vardenafil  (LEVITRA ) 20 MG tablet, Take 0.5-1 tablets (10-20 mg total) by mouth daily as needed for erectile dysfunction., Disp: 10 tablet, Rfl: 11  Observations/Objective: Patient is well-developed, well-nourished in no acute distress.  Resting comfortably  at home.  Head is normocephalic, atraumatic.  No labored breathing.  Speech is clear and coherent with logical content.  Patient is alert and oriented at baseline.    Assessment and Plan: 1. Asthma exacerbation, non-allergic, moderate persistent (Primary)  Prednisone  course and antibiotic prescribed.  Supportive care discussed. In person evaluation precautions discussed.   Follow Up Instructions: I discussed the assessment  and treatment plan with the patient. The patient was provided an opportunity to ask questions and all were answered. The patient agreed with the plan and demonstrated an understanding of the instructions.  A copy of instructions were sent to the patient via MyChart unless otherwise noted below.     The patient was advised to call back or seek an in-person evaluation if the symptoms worsen or if the condition fails to improve as anticipated.    Harlene PEDLAR Ward, PA-C

## 2024-06-12 NOTE — Patient Instructions (Signed)
 Clarence Alvarez, thank you for joining Harlene PEDLAR Ward, PA-C for today's virtual visit.  While this provider is not your primary care provider (PCP), if your PCP is located in our provider database this encounter information will be shared with them immediately following your visit.   A [facility] MyChart account gives you access to today's visit and all your visits, tests, and labs performed at [facility]  click here if you don't have a [facility] MyChart account or go to [email or web address]  Consent: (Patient) Clarence Alvarez provided verbal consent for this virtual visit at the beginning of the encounter.  Current Medications:  Current Outpatient Medications:    azithromycin  (ZITHROMAX ) 250 MG tablet, Take 2 tablets on day 1, then 1 tablet daily on days 2 through 5, Disp: 6 tablet, Rfl: 0   predniSONE  (DELTASONE ) 20 MG tablet, Take 2 tablets (40 mg total) by mouth daily with breakfast for 5 days., Disp: 10 tablet, Rfl: 0   Accu-Chek Softclix Lancets lancets, Use to check blood sugar daily, Disp: 100 each, Rfl: 3   albuterol  (VENTOLIN  HFA) 108 (90 Base) MCG/ACT inhaler, TAKE 2 PUFFS BY MOUTH EVERY 6 HOURS AS NEEDED FOR WHEEZE OR SHORTNESS OF BREATH, Disp: 8.5 each, Rfl: 0   amLODipine  (NORVASC ) 5 MG tablet, Take 1 tablet (5 mg total) by mouth daily., Disp: 90 tablet, Rfl: 3   Blood Glucose Monitoring Suppl (ACCU-CHEK GUIDE) w/Device KIT, Use to check blood sugar daily, Disp: 1 kit, Rfl: 0   cetirizine (ZYRTEC) 10 MG tablet, Take 10 mg by mouth every morning. , Disp: , Rfl:    Continuous Glucose Sensor (FREESTYLE LIBRE 3 PLUS SENSOR) MISC, PLACE 1 SENSOR ON THE SKIN EVERY 14 DAYS. USE TO CHECK GLUCOSE CONTINUOUSLY, Disp: 2 each, Rfl: 11   cyclobenzaprine  (FLEXERIL ) 10 MG tablet, TAKE 1/2 TO 1 TABLET (5-10 MG TOTAL) BY MOUTH EVERY DAY AT BEDTIME AS NEEDED FOR MUSCLE SPASM, Disp: 90 tablet, Rfl: 1   glucose blood (ACCU-CHEK GUIDE) test strip, Use to check blood sugar daily,  Disp: 100 each, Rfl: 3   Lancets Misc. (ACCU-CHEK SOFTCLIX LANCET DEV) KIT, Use to check blood sugar daily, Disp: 1 kit, Rfl: 0   Multiple Vitamin (MULTIVITAMIN WITH MINERALS) TABS tablet, Take 1 tablet by mouth daily., Disp: , Rfl:    omeprazole  (PRILOSEC) 20 MG capsule, Take 1 capsule (20 mg total) by mouth daily., Disp: 90 capsule, Rfl: 3   rosuvastatin  (CRESTOR ) 20 MG tablet, TAKE 1 TABLET BY MOUTH EVERY DAY, Disp: 90 tablet, Rfl: 3   tamsulosin  (FLOMAX ) 0.4 MG CAPS capsule, Take 1 capsule (0.4 mg total) by mouth daily., Disp: 90 capsule, Rfl: 3   vardenafil  (LEVITRA ) 20 MG tablet, Take 0.5-1 tablets (10-20 mg total) by mouth daily as needed for erectile dysfunction., Disp: 10 tablet, Rfl: 11   Medications ordered in this encounter:  Meds ordered this encounter  Medications   predniSONE  (DELTASONE ) 20 MG tablet    Sig: Take 2 tablets (40 mg total) by mouth daily with breakfast for 5 days.    Dispense:  10 tablet    Refill:  0    Supervising Provider:   LAMPTEY, PHILIP O [[id number]]   azithromycin  (ZITHROMAX ) 250 MG tablet    Sig: Take 2 tablets on day 1, then 1 tablet daily on days 2 through 5    Dispense:  6 tablet    Refill:  0    Supervising Provider:   BLAISE ALEENE MALVA [[id number]]     *  If you need refills on other medications prior to your next appointment, please contact your pharmacy*  Follow-Up: Call back or seek an in-person evaluation if the symptoms worsen or if the condition fails to improve as anticipated.  [facility] [phone]  Other Instructions Take prednisone  as prescribed.  Continue with inhaler or nebulizer as needed for wheezing or shortness of breath.  Continue with Mucinex. Drink plenty of fluids.  If no improvement or symptoms become worse follow up for in person evaluation.    If you have been instructed to have an in-person evaluation today at a local Urgent Care facility, please use the link below. It will take you to a list of all of our  available [facility], including address, phone number and hours of operation. Please do not delay care.  [facility]  If you or a family member do not have a primary care provider, use the link below to schedule a visit and establish care. When you choose a [facility] primary care physician or advanced practice provider, you gain a long-term partner in health. Find a Primary Care Provider  Learn more about [facility] in-office and virtual care options: [facility] Get Care Now

## 2024-06-15 ENCOUNTER — Telehealth: Payer: Self-pay

## 2024-06-15 ENCOUNTER — Other Ambulatory Visit (HOSPITAL_COMMUNITY): Payer: Self-pay

## 2024-06-15 NOTE — Telephone Encounter (Signed)
 He does not use insulin .  I do not have any documented blood sugars less than 54.

## 2024-06-15 NOTE — Telephone Encounter (Signed)
 To help increase the likelihood of successful prior authorization for a continuous glucose monitor, please review the documentation and include evidence of at least two hypoglycemic events (insurers often require glucose values <=54 mg/dL) and/or clear documentation that the patient relies on insulin to adequately control their diabetes. Thank you for your partnership.

## 2024-06-20 ENCOUNTER — Other Ambulatory Visit: Payer: Self-pay | Admitting: Family Medicine

## 2024-06-20 DIAGNOSIS — E119 Type 2 diabetes mellitus without complications: Secondary | ICD-10-CM

## 2024-06-20 NOTE — Telephone Encounter (Signed)
 Copied from CRM #[id number]. Topic: Clinical - Medication Refill >> Jun 20, 2024  5:17 PM Dedra B wrote: Medication: Continuous Glucose Sensor (FREESTYLE LIBRE 3 PLUS SENSOR) MISC  Has the patient contacted their pharmacy? Yes, no refills  This is the patient's preferred pharmacy:   [id number] - [place], [place] - [address] AT [facility] [address] [place] [address] Phone: [phone] Fax: [phone]  Is this the correct pharmacy for this prescription? Yes  Has the prescription been filled recently? No  Is the patient out of the medication? No, two days left  Has the patient been seen for an appointment in the last year OR does the patient have an upcoming appointment? Yes  Can we respond through MyChart? Yes  Agent: Please be advised that Rx refills may take up to 3 business days. We ask that you follow-up with your pharmacy.

## 2024-06-21 MED ORDER — FREESTYLE LIBRE 3 PLUS SENSOR MISC: 11 refills | Status: AC

## 2024-07-12 ENCOUNTER — Other Ambulatory Visit: Payer: Self-pay | Admitting: Family Medicine

## 2024-07-13 NOTE — Telephone Encounter (Signed)
 Last office visit 02/22/2024 for CPE.  Last refilled 12/14/23 for #90 with 1 refill.  Next Appt: No future appointments.

## 2024-07-19 ENCOUNTER — Other Ambulatory Visit: Payer: Self-pay | Admitting: Family Medicine

## 2024-08-14 ENCOUNTER — Other Ambulatory Visit: Payer: Self-pay | Admitting: Family Medicine

## 2024-08-14 DIAGNOSIS — I10 Essential (primary) hypertension: Secondary | ICD-10-CM
